# Patient Record
Sex: Female | Born: 1960
Health system: Southern US, Community
[De-identification: ages and names within clinical notes are randomized; demographics above are authoritative.]

## PROBLEM LIST (undated history)

## (undated) DIAGNOSIS — N2581 Secondary hyperparathyroidism of renal origin: Secondary | ICD-10-CM

## (undated) DIAGNOSIS — F1910 Other psychoactive substance abuse, uncomplicated: Secondary | ICD-10-CM

## (undated) DIAGNOSIS — K861 Other chronic pancreatitis: Secondary | ICD-10-CM

## (undated) DIAGNOSIS — F3181 Bipolar II disorder: Secondary | ICD-10-CM

## (undated) DIAGNOSIS — B182 Chronic viral hepatitis C: Secondary | ICD-10-CM

## (undated) DIAGNOSIS — T56891A Toxic effect of other metals, accidental (unintentional), initial encounter: Secondary | ICD-10-CM

## (undated) DIAGNOSIS — F1210 Cannabis abuse, uncomplicated: Secondary | ICD-10-CM

## (undated) DIAGNOSIS — J449 Chronic obstructive pulmonary disease, unspecified: Secondary | ICD-10-CM

## (undated) DIAGNOSIS — G629 Polyneuropathy, unspecified: Secondary | ICD-10-CM

## (undated) DIAGNOSIS — Z720 Tobacco use: Secondary | ICD-10-CM

## (undated) DIAGNOSIS — I639 Cerebral infarction, unspecified: Secondary | ICD-10-CM

## (undated) DIAGNOSIS — F419 Anxiety disorder, unspecified: Secondary | ICD-10-CM

## (undated) DIAGNOSIS — E039 Hypothyroidism, unspecified: Secondary | ICD-10-CM

## (undated) DIAGNOSIS — I2510 Atherosclerotic heart disease of native coronary artery without angina pectoris: Secondary | ICD-10-CM

## (undated) DIAGNOSIS — N183 Chronic kidney disease, stage 3 unspecified: Secondary | ICD-10-CM

## (undated) DIAGNOSIS — G2401 Drug induced subacute dyskinesia: Secondary | ICD-10-CM

## (undated) DIAGNOSIS — E1143 Type 2 diabetes mellitus with diabetic autonomic (poly)neuropathy: Secondary | ICD-10-CM

## (undated) DIAGNOSIS — N184 Chronic kidney disease, stage 4 (severe): Secondary | ICD-10-CM

## (undated) DIAGNOSIS — E119 Type 2 diabetes mellitus without complications: Secondary | ICD-10-CM

## (undated) HISTORY — PX: CHOLECYSTECTOMY: SHX55

---

## 2016-02-28 ENCOUNTER — Ambulatory Visit (INDEPENDENT_AMBULATORY_CARE_PROVIDER_SITE_OTHER): Payer: Medicaid Other | Admitting: Internal Medicine

## 2016-02-28 ENCOUNTER — Encounter: Payer: Self-pay | Admitting: Internal Medicine

## 2016-02-28 DIAGNOSIS — E785 Hyperlipidemia, unspecified: Secondary | ICD-10-CM | POA: Diagnosis not present

## 2016-02-28 DIAGNOSIS — F319 Bipolar disorder, unspecified: Secondary | ICD-10-CM | POA: Diagnosis not present

## 2016-02-28 DIAGNOSIS — B182 Chronic viral hepatitis C: Secondary | ICD-10-CM | POA: Diagnosis present

## 2016-02-28 LAB — CBC WITH DIFFERENTIAL/PLATELET
Basophils Absolute: 78 cells/uL (ref 0–200)
Basophils Relative: 1 %
Eosinophils Absolute: 234 cells/uL (ref 15–500)
Eosinophils Relative: 3 %
HCT: 38.5 % (ref 35.0–45.0)
Hemoglobin: 12.8 g/dL (ref 11.7–15.5)
Lymphocytes Relative: 28 %
Lymphs Abs: 2184 cells/uL (ref 850–3900)
MCH: 28.9 pg (ref 27.0–33.0)
MCHC: 33.2 g/dL (ref 32.0–36.0)
MCV: 86.9 fL (ref 80.0–100.0)
MPV: 11 fL (ref 7.5–12.5)
Monocytes Absolute: 468 cells/uL (ref 200–950)
Monocytes Relative: 6 %
Neutro Abs: 4836 cells/uL (ref 1500–7800)
Neutrophils Relative %: 62 %
Platelets: 170 10*3/uL (ref 140–400)
RBC: 4.43 MIL/uL (ref 3.80–5.10)
RDW: 13.6 % (ref 11.0–15.0)
WBC: 7.8 10*3/uL (ref 3.8–10.8)

## 2016-02-28 LAB — HIV ANTIBODY (ROUTINE TESTING W REFLEX): HIV 1&2 Ab, 4th Generation: NONREACTIVE

## 2016-02-28 MED ORDER — GLECAPREVIR-PIBRENTASVIR 100-40 MG PO TABS: 3.0000 | ORAL_TABLET | Freq: Every day | ORAL | 1 refills | Status: DC

## 2016-02-28 NOTE — Patient Instructions (Signed)
Date 02/28/16  Dear [name], As discussed in the ID Clinic, your hepatitis C therapy will include the following medications:          Mavyret (glecaprevir 100 mg/pibrentasvir 40 mg): Take 3 tablets by mouth once daily for 8 or 12 weeks ---------------------------------------------------------------- Your HCV Treatment Start Date: TBA   Your HCV genotype:  1a    Liver Fibrosis: TBD    ---------------------------------------------------------------- YOUR PHARMACY CONTACT:   [facility] [address] [place], [place] [address] Phone: [phone] Hours: Monday to Friday 7:30 am to 6:00 pm   Please always contact your pharmacy at least 3-4 business days before you run out of medications to ensure your next month's medication is ready or 1 week prior to running out if you receive it by mail.  Remember, each prescription is for 28 days. ---------------------------------------------------------------- GENERAL NOTES REGARDING YOUR HEPATITIS C MEDICATION:  Mavyret: - tablets are pink, oblong shape - take 3 tablets daily with food. - The tablets should be stored at room temperature.  - Acid reducing agents such as H2 blockers (ie. Pepcid (famotidine), Zantac (ranitidine), Tagamet (cimetidine), Axid (nizatidine) and proton pump inhibitors (ie. Prilosec (omeprazole), Protonix (pantoprazole), Nexium (esomeprazole), or Aciphex (rabeprazole)) can decrease effectiveness of Harvoni. Do not take until you have discussed with a health care provider.    -Antacids that contain magnesium and/or aluminum hydroxide (ie. Milk of Magensia, Rolaids, Gaviscon, Maalox, Mylanta, an dArthritis Pain Formula) can reduce absorption of Harvoni, so take them at least 4 hours before or after Harvoni.  -Calcium carbonate (calcium supplements or antacids such as Tums, Caltrate, Os-Cal) needs to be taken at least 4 hours hours before or after Harvoni.  -St. John's wort or any products that contain St.  John's wort like some herbal supplements  Please inform the office prior to starting any of these medications.  - The common side effects associated with Mavyret include:      1. Fatigue      2. Headache      3. Nausea      4. Diarrhea      5. Insomnia  Please note that this only lists the most common side effects and is NOT a comprehensive list of the potential side effects of these medications. For more information, please review the drug information sheets that come with your medication package from the pharmacy.  ---------------------------------------------------------------- GENERAL HELPFUL HINTS ON HCV THERAPY: 1. Stay well-hydrated. 2. Notify the ID Clinic of any changes in your other over-the-counter/herbal or prescription medications. 3. If you miss a dose of your medication, take the missed dose as soon as you remember. Return to your regular time/dose schedule the next day.  4.  Do not stop taking your medications without first talking with your healthcare provider. 5.  You may take Tylenol (acetaminophen), as long as the dose is less than 2000 mg (OR no more than 4 tablets of the Tylenol Extra Strengths 500mg  tablet) in 24 hours. 6.  You will see our pharmacist-specialist within the first 2 weeks of starting your medication to monitor for any possible side effects. 7.  You will have labs once during treatment, after soon after treatment completion and one final lab 6 months after treatment completion to verify the virus is out of your system.  Staci RighterOMER, ROBERT, MD  [facility] [address] [place], [place]  [id number] [phone]

## 2016-02-28 NOTE — Progress Notes (Signed)
[facility]   CC: consideration for treatment for chronic hepatitis C  HPI:  +Sarah Davis is a 56 y.o. female who presents for initial evaluation and management of chronic hepatitis C.  Patient tested positive several years ago. Hepatitis C-associated risk factors present are: IV drug abuse (details: over 30 years ago). Patient denies renal dialysis, sexual contact with person with liver disease. Patient has had other studies performed. Results: hepatitis C RNA by PCR, result: positive. Patient has not had prior treatment for Hepatitis C. Patient does not have a past history of liver disease. Patient does not have a family history of liver disease. Patient does not  have associated signs or symptoms related to liver disease.  Labs reviewed and confirm chronic hepatitis C with a positive viral load.   Records reviewed from her PCP and has genotype 1a, viral load positive, did receive pneumovax vaccine already.  Recent history of lithium toxicity and is on atorvastatin and oxycarbamezapine.        Patient does not have documented immunity to Hepatitis A. Patient does not have documented immunity to Hepatitis B.    Review of Systems:   Constitutional: negative for fatigue and malaise Gastrointestinal: negative for diarrhea Musculoskeletal: negative for myalgias and arthralgias All other systems reviewed and are negative       No past medical history on file.  Prior to Admission medications   Medication Sig Start Date End Date Taking? Authorizing Provider  atorvastatin (LIPITOR) 80 MG tablet Take 80 mg by mouth daily.   Yes Historical Provider, MD  gabapentin (NEURONTIN) 300 MG capsule Take 600 mg by mouth 2 (two) times daily.   Yes Historical Provider, MD  OXcarbazepine (TRILEPTAL) 150 MG tablet Take 450 mg by mouth at bedtime.   Yes Historical Provider, MD  QUEtiapine (SEROQUEL) 100 MG tablet Take 100 mg by mouth daily.   Yes Historical Provider, MD    QUEtiapine (SEROQUEL) 400 MG tablet Take 400 mg by mouth at bedtime.   Yes Historical Provider, MD  Glecaprevir-Pibrentasvir (MAVYRET) 100-40 MG TABS Take 3 tablets by mouth daily. 02/28/16   Gardiner Barefoot, MD    Allergies  Allergen Reactions  . Lithium Anaphylaxis  . Tramadol Nausea Only  . Sulfa Antibiotics Rash    Social History  Substance Use Topics  . Smoking status: Current Every Day Smoker    Packs/day: 0.50    Start date: 02/18/1994  . Smokeless tobacco: Never Used  . Alcohol use No    FMH: no cirrhosis, no liver cancer; father with bipolar   Objective:  Constitutional: in no apparent distress and alert,  Vitals:   02/28/16 1053  BP: 119/85  Pulse: 97  Temp: 98.3 F (36.8 C)   Eyes: anicteric Cardiovascular: Cor RRR and No murmurs Respiratory: CTA B; normal respiratory effort Gastrointestinal: Bowel sounds are normal, liver is not enlarged, spleen is not enlarged Musculoskeletal: no pedal edema noted Skin: negatives: no rash; no porphyria cutanea tarda Lymphatic: no cervical lymphadenopathy   Laboratory  Labs and history reviewed and show CHILD-PUGH unknown  5-6 points: Child class A 7-9 points: Child class B 10-15 points: Child class C  No results found for: INR, BILITOT, ALBUMIN   Assessment: New Patient with Chronic Hepatitis C genotype 1a, untreated.  I discussed with the patient the lab findings that confirm chronic hepatitis C as well as the natural history and progression of disease including about 30% of people who develop cirrhosis of the liver  if left untreated and once cirrhosis is established there is a 2-7% risk per year of liver cancer and liver failure.  I discussed the importance of treatment and benefits in reducing the risk, even if significant liver fibrosis exists.   Plan: 1) Patient counseled extensively on limiting acetaminophen to no more than 2 grams daily, avoidance of alcohol. 2) Transmission discussed with patient including  sexual transmission, sharing razors and toothbrush.   3) Will need referral to gastroenterology if concern for cirrhosis 4) Will need referral for substance abuse counseling: No.; Further work up to include urine drug screen  No. 5) Will prescribe Mayvret for 8 weeks 12 weeks 6) Hepatitis A and B titers 8) Pneumovax vaccine given by her PCP recently 9) Further work up to include liver staging with Fibrosure 10) will follow up after starting medication 11) bipolar - she will get off of her oxycarbamezapine and will hold atorvastatin while on hepatitis C medication.

## 2016-02-29 LAB — COMPLETE METABOLIC PANEL WITH GFR
ALT: 17 U/L (ref 6–29)
AST: 19 U/L (ref 10–35)
Albumin: 4 g/dL (ref 3.6–5.1)
Alkaline Phosphatase: 112 U/L (ref 33–130)
BUN: 30 mg/dL — ABNORMAL HIGH (ref 7–25)
CO2: 23 mmol/L (ref 20–31)
Calcium: 9 mg/dL (ref 8.6–10.4)
Chloride: 105 mmol/L (ref 98–110)
Creat: 1.74 mg/dL — ABNORMAL HIGH (ref 0.50–1.05)
GFR, Est African American: 37 mL/min — ABNORMAL LOW (ref >=60)
GFR, Est Non African American: 32 mL/min — ABNORMAL LOW (ref >=60)
Glucose, Bld: 233 mg/dL — ABNORMAL HIGH (ref 65–99)
Potassium: 3.6 mmol/L (ref 3.5–5.3)
Sodium: 140 mmol/L (ref 135–146)
Total Bilirubin: 0.4 mg/dL (ref 0.2–1.2)
Total Protein: 7.3 g/dL (ref 6.1–8.1)

## 2016-02-29 LAB — HEPATITIS B SURFACE ANTIGEN: Hepatitis B Surface Ag: NEGATIVE

## 2016-02-29 LAB — HEPATITIS B SURFACE ANTIBODY,QUALITATIVE: Hep B S Ab: NEGATIVE

## 2016-02-29 LAB — HEPATITIS A ANTIBODY, TOTAL: Hep A Total Ab: NONREACTIVE

## 2016-02-29 LAB — PROTIME-INR
INR: 1
Prothrombin Time: 10.7 s (ref 9.0–11.5)

## 2016-02-29 LAB — HEPATITIS B CORE ANTIBODY, TOTAL: Hep B Core Total Ab: NONREACTIVE

## 2016-03-02 LAB — LIVER FIBROSIS, FIBROTEST-ACTITEST
ALT: 17 U/L (ref 6–29)
Alpha-2-Macroglobulin: 440 mg/dL — ABNORMAL HIGH (ref 106–279)
Apolipoprotein A1: 144 mg/dL (ref 101–198)
Bilirubin: 0.3 mg/dL (ref 0.2–1.2)
Fibrosis Score: 0.52
GGT: 58 U/L (ref 3–70)
Haptoglobin: 134 mg/dL (ref 43–212)
Necroinflammat ACT Score: 0.08
Reference ID: 1772120

## 2016-03-04 LAB — HEPATITIS C RNA QUANTITATIVE
HCV Quantitative Log: 6.26 {Log} — ABNORMAL HIGH (ref <1.18)
HCV Quantitative: 1801879 IU/mL — ABNORMAL HIGH (ref <15)

## 2016-04-03 MED FILL — MAVYRET 100-40 MG TABS: 100-40 | 28 days supply | Qty: 84 | Fill #0

## 2016-04-22 ENCOUNTER — Encounter: Payer: Self-pay | Admitting: Pharmacy Technician

## 2016-04-30 ENCOUNTER — Ambulatory Visit (INDEPENDENT_AMBULATORY_CARE_PROVIDER_SITE_OTHER): Payer: Medicaid Other | Admitting: Pharmacist Clinician (PhC)/ Clinical Pharmacy Specialist

## 2016-04-30 DIAGNOSIS — Z23 Encounter for immunization: Secondary | ICD-10-CM | POA: Diagnosis not present

## 2016-04-30 DIAGNOSIS — B182 Chronic viral hepatitis C: Secondary | ICD-10-CM | POA: Diagnosis present

## 2016-04-30 LAB — BASIC METABOLIC PANEL
BUN: 27 mg/dL — ABNORMAL HIGH (ref 7–25)
CO2: 23 mmol/L (ref 20–31)
Calcium: 9.4 mg/dL (ref 8.6–10.4)
Chloride: 104 mmol/L (ref 98–110)
Creat: 1.89 mg/dL — ABNORMAL HIGH (ref 0.50–1.05)
Glucose, Bld: 173 mg/dL — ABNORMAL HIGH (ref 65–99)
Potassium: 4 mmol/L (ref 3.5–5.3)
Sodium: 138 mmol/L (ref 135–146)

## 2016-04-30 MED FILL — MAVYRET 100-40 MG TABS: 100-40 | 28 days supply | Qty: 84 | Fill #1

## 2016-04-30 NOTE — Progress Notes (Signed)
HPI: Sarah Davis is a 56 y.o. female who is here for her follow for hep C  No results found for: HCVGENOTYPE, HEPCGENOTYPE  Allergies: Allergies  Allergen Reactions  . Lithium Anaphylaxis  . Tramadol Nausea Only  . Codeine Rash  . Sulfa Antibiotics Rash    Vitals:    Past Medical History: No past medical history on file.  Social History: Social History   Social History  . Marital status: Single    Spouse name: N/A  . Number of children: N/A  . Years of education: N/A   Social History Main Topics  . Smoking status: Current Every Day Smoker    Packs/day: 0.50    Start date: 02/18/1994  . Smokeless tobacco: Never Used  . Alcohol use No  . Drug use: No  . Sexual activity: Not Currently   Other Topics Concern  . Not on file   Social History Narrative  . No narrative on file    Labs: Hep B S Ab (no units)  Date Value  02/28/2016 NEG   Hepatitis B Surface Ag (no units)  Date Value  02/28/2016 NEGATIVE    No results found for: HCVGENOTYPE, HEPCGENOTYPE  Hepatitis C RNA quantitative Latest Ref Rng & Units 02/28/2016  HCV Quantitative <15 IU/mL 1,801,879(H)  HCV Quantitative Log <1.18 log 10 6.26(H)    AST (U/L)  Date Value  02/28/2016 19   ALT (U/L)  Date Value  02/28/2016 17  02/28/2016 17   INR (no units)  Date Value  02/28/2016 1.0    CrCl: CrCl cannot be calculated (Patient's most recent lab result is older than the maximum 21 days allowed.).  Fibrosis Score: F2 as assessed by Fibrosure  Child-Pugh Score: Class A  Previous Treatment Regimen: None  Assessment: Cline CrockKandy started on her Mavyret around 2/16. She has not missed any doses. It caused some mild tiredness so she takes it at night. Since she is F2 she will only need 8 wks through medicaid. Told [facility] pharmacy to mail out her second month supply today since she has about 3 days left. Going to get a VL today.   Gave her the hep A and B vaccine today. Will see her in 1 month for the second  hep B and we can do the EOT hep C VL at that time. She will be schedule with pharmacy for the cure visit.   Recommendations:  Hep C VL, Bmet Cont Mavyret x 2 month Hep A/B vaccine today See back in 1 month for vaccine and EOT VL   Leonidus Rowand, Pharm.D., BCPS, AAHIVP Clinical Infectious Disease Pharmacist [facility] 04/30/2016, 2:55 PM

## 2016-04-30 NOTE — Patient Instructions (Signed)
Continue your Mavyret for 3 month Come back and see me in a month for labs and vaccine

## 2016-05-02 LAB — HEPATITIS C RNA QUANTITATIVE
HCV Quantitative Log: <1.18 Log IU/mL
HCV Quantitative: <15 IU/mL

## 2016-06-11 ENCOUNTER — Ambulatory Visit (INDEPENDENT_AMBULATORY_CARE_PROVIDER_SITE_OTHER): Payer: Medicaid Other | Admitting: Pharmacist

## 2016-06-11 DIAGNOSIS — B182 Chronic viral hepatitis C: Secondary | ICD-10-CM | POA: Diagnosis not present

## 2016-06-11 DIAGNOSIS — Z23 Encounter for immunization: Secondary | ICD-10-CM

## 2016-06-11 NOTE — Progress Notes (Signed)
HPI: Sarah Davis is a 56 y.o. female who presents to the [facility] pharmacy clinic for EOT follow-up of her Hep C infection. She finished 8 weeks of Mavyret on April 15th.   No results found for: HCVGENOTYPE, HEPCGENOTYPE  Allergies: Allergies  Allergen Reactions  . Lithium Anaphylaxis  . Tramadol Nausea Only  . Codeine Rash  . Sulfa Antibiotics Rash    Past Medical History: No past medical history on file.  Social History: Social History   Social History  . Marital status: Single    Spouse name: N/A  . Number of children: N/A  . Years of education: N/A   Social History Main Topics  . Smoking status: Current Every Day Smoker    Packs/day: 0.50    Start date: 02/18/1994  . Smokeless tobacco: Never Used  . Alcohol use No  . Drug use: No  . Sexual activity: Not Currently   Other Topics Concern  . Not on file   Social History Narrative  . No narrative on file    Labs: Hep B S Ab (no units)  Date Value  02/28/2016 NEG   Hepatitis B Surface Ag (no units)  Date Value  02/28/2016 NEGATIVE    No results found for: HCVGENOTYPE, HEPCGENOTYPE  Hepatitis C RNA quantitative Latest Ref Rng & Units 04/30/2016 02/28/2016  HCV Quantitative NOT DETECTED IU/mL <15 NOT DETECTED 1,801,879(H)  HCV Quantitative Log NOT DETECTED Log IU/mL <1.18 NOT DETECTED 6.26(H)    AST (U/L)  Date Value  02/28/2016 19   ALT (U/L)  Date Value  02/28/2016 17  02/28/2016 17   INR (no units)  Date Value  02/28/2016 1.0    CrCl: CrCl cannot be calculated (Patient's most recent lab result is older than the maximum 21 days allowed.).  Fibrosis Score: F2 as assessed by fibrosure   Child-Pugh Score: A  Previous Treatment Regimen: None  Assessment: Sarah Davis is here today for her Hep C EOT follow-up.  She did not miss any doses of her Mavyret the entire 8 weeks.  She states it made her dizzy, nauseous, and tired, but it has since resolved after completing therapy.  She is asking today if she can  go back on her Lipitor.  I told her yes it would be fine. Told her it was ok to go back on the Trileptal too and she said she was going to see about restarting that one.  Will check another EOT Hep C viral load today.  Will also give 2nd Hep B vaccine and make all of her follow-ups.    Plans: - EOT Hep C viral load today - 2nd Hep B shot today - F/u with lab 7/16 at 2:30 for SVR12  - F/u with pharmacy 7/23 at 2:30pm for SVR12 visit - 2nd Hep A and 3rd Hep B 9/17 at 2:30pm  Sarah Davis L. Dinita Migliaccio, PharmD, CPP Infectious Diseases Clinical Pharmacist [facility] 06/11/2016, 3:13 PM

## 2016-06-13 LAB — HEPATITIS C RNA QUANTITATIVE
HCV Quantitative Log: <1.18 Log IU/mL
HCV Quantitative: <15 IU/mL

## 2016-09-02 ENCOUNTER — Telehealth: Payer: Self-pay | Admitting: Pharmacist Clinician (PhC)/ Clinical Pharmacy Specialist

## 2016-09-02 ENCOUNTER — Other Ambulatory Visit: Payer: Medicaid Other

## 2016-09-02 NOTE — Telephone Encounter (Signed)
Won't be able to come [facility] because she just broke her ankle and can't put any weight on it. She will call back to reschedule the appt when she can move around.

## 2016-09-09 ENCOUNTER — Ambulatory Visit: Payer: Medicaid Other

## 2016-10-16 ENCOUNTER — Ambulatory Visit (INDEPENDENT_AMBULATORY_CARE_PROVIDER_SITE_OTHER): Payer: Medicaid Other | Admitting: Pharmacist

## 2016-10-16 DIAGNOSIS — B182 Chronic viral hepatitis C: Secondary | ICD-10-CM

## 2016-10-16 NOTE — Progress Notes (Signed)
HPI: Sarah Davis is a 56 y.o. female who presents to the [facility] pharmacy clinic for her Hep C cure visit.  She completed 8 weeks of Mavyret back in April.   No results found for: HCVGENOTYPE, HEPCGENOTYPE  Allergies: Allergies  Allergen Reactions  . Lithium Anaphylaxis  . Tramadol Nausea Only  . Codeine Rash  . Sulfa Antibiotics Rash    Past Medical History: No past medical history on file.  Social History: Social History   Social History  . Marital status: Single    Spouse name: N/A  . Number of children: N/A  . Years of education: N/A   Social History Main Topics  . Smoking status: Current Every Day Smoker    Packs/day: 0.50    Start date: 02/18/1994  . Smokeless tobacco: Never Used  . Alcohol use No  . Drug use: No  . Sexual activity: Not Currently   Other Topics Concern  . Not on file   Social History Narrative  . No narrative on file    Labs: Hep B S Ab (no units)  Date Value  02/28/2016 NEG   Hepatitis B Surface Ag (no units)  Date Value  02/28/2016 NEGATIVE    No results found for: HCVGENOTYPE, HEPCGENOTYPE  Hepatitis C RNA quantitative Latest Ref Rng & Units 06/11/2016 04/30/2016 02/28/2016  HCV Quantitative NOT DETECTED IU/mL <15 NOT DETECTED <15 NOT DETECTED 1,801,879(H)  HCV Quantitative Log NOT DETECTED Log IU/mL <1.18 NOT DETECTED <1.18 NOT DETECTED 6.26(H)    AST (U/L)  Date Value  02/28/2016 19   ALT (U/L)  Date Value  02/28/2016 17  02/28/2016 17   INR (no units)  Date Value  02/28/2016 1.0    CrCl: CrCl cannot be calculated (Patient's most recent lab result is older than the maximum 21 days allowed.).  Fibrosis Score: F2 as assessed by fibrosure  Child-Pugh Score: A  Previous Treatment Regimen: None  Assessment: Sarah Davis is here today to follow-up for her Hep C cure visit.  She successfully completed 8 weeks of Mavyret back in April with no missed doses. She will get her SVR12 Hep C viral load today.  I will call her with the  results.  Educated her on the risk of reinfection and to avoid excessive alcohol use.    Plans: - Hep C cure viral load today  Cassie L. Kuppelweiser, PharmD, CPP Infectious Diseases Clinical Pharmacist [facility] 10/16/2016, 3:48 PM

## 2016-10-22 ENCOUNTER — Telehealth: Payer: Self-pay | Admitting: Pharmacist Clinician (PhC)/ Clinical Pharmacy Specialist

## 2016-10-22 LAB — HEPATITIS C RNA QUANTITATIVE
HCV Quantitative Log: <1.18 Log IU/mL
HCV Quantitative: <15 IU/mL

## 2016-10-22 NOTE — Telephone Encounter (Signed)
Called Sarah Davis to let her knows that she is now cured of her hep C.

## 2016-11-04 ENCOUNTER — Ambulatory Visit: Payer: Medicaid Other

## 2017-06-19 ENCOUNTER — Encounter (HOSPITAL_COMMUNITY): Payer: Self-pay | Admitting: *Deleted

## 2017-06-19 ENCOUNTER — Ambulatory Visit (HOSPITAL_COMMUNITY)
Admission: RE | Admit: 2017-06-19 | Discharge: 2017-06-19 | Disposition: A | Discharge status: Home / Self Care | Payer: Medicaid Other | Attending: Psychiatry | Admitting: Psychiatry

## 2017-06-19 ENCOUNTER — Emergency Department (HOSPITAL_COMMUNITY)
Admission: EM | Admit: 2017-06-19 | Discharge: 2017-06-20 | Disposition: A | Discharge status: Home / Self Care | Payer: Medicaid Other | Attending: Emergency Medicine | Admitting: Emergency Medicine

## 2017-06-19 DIAGNOSIS — F1920 Other psychoactive substance dependence, uncomplicated: Secondary | ICD-10-CM | POA: Diagnosis not present

## 2017-06-19 DIAGNOSIS — F419 Anxiety disorder, unspecified: Secondary | ICD-10-CM

## 2017-06-19 DIAGNOSIS — F17210 Nicotine dependence, cigarettes, uncomplicated: Secondary | ICD-10-CM | POA: Insufficient documentation

## 2017-06-19 DIAGNOSIS — Z9114 Patient's other noncompliance with medication regimen: Secondary | ICD-10-CM | POA: Insufficient documentation

## 2017-06-19 DIAGNOSIS — R451 Restlessness and agitation: Secondary | ICD-10-CM | POA: Diagnosis not present

## 2017-06-19 DIAGNOSIS — E1165 Type 2 diabetes mellitus with hyperglycemia: Secondary | ICD-10-CM | POA: Insufficient documentation

## 2017-06-19 DIAGNOSIS — F3181 Bipolar II disorder: Secondary | ICD-10-CM | POA: Insufficient documentation

## 2017-06-19 DIAGNOSIS — R739 Hyperglycemia, unspecified: Secondary | ICD-10-CM

## 2017-06-19 DIAGNOSIS — R454 Irritability and anger: Secondary | ICD-10-CM | POA: Insufficient documentation

## 2017-06-19 DIAGNOSIS — Z91148 Patient's other noncompliance with medication regimen for other reason: Secondary | ICD-10-CM

## 2017-06-19 DIAGNOSIS — F329 Major depressive disorder, single episode, unspecified: Secondary | ICD-10-CM | POA: Diagnosis present

## 2017-06-19 DIAGNOSIS — F1910 Other psychoactive substance abuse, uncomplicated: Secondary | ICD-10-CM

## 2017-06-19 DIAGNOSIS — E118 Type 2 diabetes mellitus with unspecified complications: Secondary | ICD-10-CM

## 2017-06-19 HISTORY — DX: Bipolar II disorder: F31.81

## 2017-06-19 HISTORY — DX: Cerebral infarction, unspecified: I63.9

## 2017-06-19 LAB — CBC
HCT: 44.2 % (ref 36.0–46.0)
Hemoglobin: 14.5 g/dL (ref 12.0–15.0)
MCH: 27.2 pg (ref 26.0–34.0)
MCHC: 32.8 g/dL (ref 30.0–36.0)
MCV: 82.9 fL (ref 78.0–100.0)
Platelets: 249 10*3/uL (ref 150–400)
RBC: 5.33 MIL/uL — ABNORMAL HIGH (ref 3.87–5.11)
RDW: 14.3 % (ref 11.5–15.5)
WBC: 12.8 10*3/uL — ABNORMAL HIGH (ref 4.0–10.5)

## 2017-06-19 LAB — RAPID URINE DRUG SCREEN, HOSP PERFORMED
Amphetamines: POSITIVE — AB
Barbiturates: NOT DETECTED
Benzodiazepines: POSITIVE — AB
Cocaine: NOT DETECTED
Opiates: NOT DETECTED
Tetrahydrocannabinol: POSITIVE — AB

## 2017-06-19 NOTE — H&P (Signed)
Behavioral Health Medical Screening Exam  Sarah Davis is an 57 y.o. female who presents to [facility] with depression, feelings of guilt, worthlessness, tearfulness, wanting to be alone, and irritability. Patient has a history of Bipolar II disorder. Patient reports that they had visitors recently and someone took her her seroquel. She had the seroquel filled on 4/18. Mother is concerned that the patient is purposefully taking in a suicide attempt as patient had a similar situation 2 years ago with lithium and was hospitalized at [facility]. Patient denies that she is suicidal and denies that the situation 2 years ago was a suicide attempt. Patient states that she really needs her seroquel and that it is the only thing that helps with her anxiety and helps her to sleep. According to her mother, the patient stays in the bed all day and sleeps all the time. Patient is anxious and irritable during the exam. Denies AVH. Reports  that she is taking her other medications.  Total Time spent with patient: 20 minutes  Psychiatric Specialty Exam: Physical Exam  Constitutional: She is oriented to person, place, and time. She appears well-developed and well-nourished. No distress.  HENT:  Head: Normocephalic and atraumatic.  Right Ear: External ear normal.  Left Ear: External ear normal.  Eyes: Conjunctivae are normal. Right eye exhibits no discharge. Left eye exhibits no discharge. No scleral icterus.  Respiratory: Effort normal. No respiratory distress.  Musculoskeletal: Normal range of motion.  Neurological: She is alert and oriented to person, place, and time.  Skin: Skin is warm and dry. She is not diaphoretic.  Psychiatric: Her mood appears anxious. Her affect is labile. Her speech is rapid and/or pressured. She is agitated. She is not withdrawn and not actively hallucinating. Thought content is not paranoid and not delusional. Cognition and memory are normal. She expresses impulsivity and inappropriate  judgment. She exhibits a depressed mood. She expresses no homicidal ideation.    Review of Systems  Psychiatric/Behavioral: Positive for depression. Negative for hallucinations, memory loss, substance abuse and suicidal ideas. The patient is nervous/anxious and has insomnia.     Blood pressure (!) 146/97, pulse 72, temperature 98.4 F (36.9 C), resp. rate 18, SpO2 100 %.There is no height or weight on file to calculate BMI.  General Appearance: Casual and Fairly Groomed  Eye Contact:  Fair  Speech:  Clear and Coherent and Pressured  Volume:  Normal  Mood:  Anxious, Depressed, Dysphoric, Hopeless, Irritable and Worthless  Affect:  Labile  Thought Process:  Coherent and Descriptions of Associations: Intact  Orientation:  Full (Time, Place, and Person)  Thought Content:  Logical and Hallucinations: None  Suicidal Thoughts:  Denies  Homicidal Thoughts:  No  Memory:  Immediate;   Good Recent;   Good  Judgement:  Fair  Insight:  Fair  Psychomotor Activity:  Normal  Concentration: Concentration: Poor and Attention Span: Poor  Recall:  Good  Fund of Knowledge:Good  Language: Good  Akathisia:  No  Handed:  Right  AIMS (if indicated):     Assets:  Architect Housing Leisure Time Physical Health  Sleep:       Musculoskeletal: Strength & Muscle Tone: within normal limits Gait & Station: normal   Blood pressure (!) 146/97, pulse 72, temperature 98.4 F (36.9 C), resp. rate 18, SpO2 100 %.  Recommendations:  Based on my evaluation the patient does not appear to have an emergency medical condition. I am concerned that the patient may be taking more  seroquel than prescribed, as she mentioned several times that it helps with her anxiety and is the only thing that helps her sleep. Will send for medical clearance and observation.  Jackelyn Poling, NP 06/19/2017, 9:02 PM

## 2017-06-19 NOTE — ED Notes (Addendum)
Clothing placed in 1 clothing bag at triage desk. 1 pair of glasses pt admits are already broken.
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
## 2017-06-19 NOTE — ED Triage Notes (Signed)
Pt stated "I've been out of Seroquel x 3 days.  I can get it if I pay $400.  [facility] called in something else that my son picked up.  I haven't slept or eaten x 3 days."

## 2017-06-20 DIAGNOSIS — F17210 Nicotine dependence, cigarettes, uncomplicated: Secondary | ICD-10-CM | POA: Diagnosis not present

## 2017-06-20 DIAGNOSIS — F1920 Other psychoactive substance dependence, uncomplicated: Secondary | ICD-10-CM | POA: Diagnosis not present

## 2017-06-20 LAB — COMPREHENSIVE METABOLIC PANEL
ALT: 15 U/L (ref 14–54)
AST: 20 U/L (ref 15–41)
Albumin: 3.9 g/dL (ref 3.5–5.0)
Alkaline Phosphatase: 135 U/L — ABNORMAL HIGH (ref 38–126)
Anion gap: 11 (ref 5–15)
BUN: 18 mg/dL (ref 6–20)
CO2: 25 mmol/L (ref 22–32)
Calcium: 9.3 mg/dL (ref 8.9–10.3)
Chloride: 105 mmol/L (ref 101–111)
Creatinine, Ser: 1.53 mg/dL — ABNORMAL HIGH (ref 0.44–1.00)
GFR calc Af Amer: 43 mL/min — ABNORMAL LOW (ref >60)
GFR calc non Af Amer: 37 mL/min — ABNORMAL LOW (ref >60)
Glucose, Bld: 263 mg/dL — ABNORMAL HIGH (ref 65–99)
Potassium: 3.2 mmol/L — ABNORMAL LOW (ref 3.5–5.1)
Sodium: 141 mmol/L (ref 135–145)
Total Bilirubin: 0.5 mg/dL (ref 0.3–1.2)
Total Protein: 8.2 g/dL — ABNORMAL HIGH (ref 6.5–8.1)

## 2017-06-20 LAB — SALICYLATE LEVEL: Salicylate Lvl: <7 mg/dL (ref 2.8–30.0)

## 2017-06-20 LAB — ETHANOL: Alcohol, Ethyl (B): <10 mg/dL (ref <10)

## 2017-06-20 LAB — ACETAMINOPHEN LEVEL: Acetaminophen (Tylenol), Serum: <10 ug/mL — ABNORMAL LOW (ref 10–30)

## 2017-06-20 MED ORDER — LAMOTRIGINE 100 MG PO TABS
100.0000 mg | ORAL_TABLET | Freq: Every day | ORAL | Status: DC
  Administered 2017-06-20: 100 mg via ORAL
  Filled 2017-06-20: qty 1

## 2017-06-20 MED ORDER — ATORVASTATIN CALCIUM 80 MG PO TABS: 80.0000 mg | ORAL_TABLET | Freq: Every day | ORAL | Status: DC

## 2017-06-20 MED ORDER — QUETIAPINE FUMARATE 100 MG PO TABS
100.0000 mg | ORAL_TABLET | Freq: Two times a day (BID) | ORAL | Status: DC
  Administered 2017-06-20: 100 mg via ORAL
  Filled 2017-06-20: qty 1

## 2017-06-20 MED ORDER — METFORMIN HCL 500 MG PO TABS
500.0000 mg | ORAL_TABLET | Freq: Two times a day (BID) | ORAL | Status: DC
  Administered 2017-06-20 (×2): 500 mg via ORAL
  Filled 2017-06-20 (×2): qty 1

## 2017-06-20 MED ORDER — POTASSIUM CHLORIDE CRYS ER 20 MEQ PO TBCR: 40.0000 meq | EXTENDED_RELEASE_TABLET | Freq: Once | ORAL | Status: DC

## 2017-06-20 MED ORDER — POTASSIUM CHLORIDE CRYS ER 20 MEQ PO TBCR: 40.0000 meq | EXTENDED_RELEASE_TABLET | Freq: Once | ORAL | 0 refills | Status: DC

## 2017-06-20 MED ORDER — HYDROXYZINE HCL 25 MG PO TABS
50.0000 mg | ORAL_TABLET | Freq: Three times a day (TID) | ORAL | Status: DC | PRN
  Administered 2017-06-20: 50 mg via ORAL
  Filled 2017-06-20: qty 2

## 2017-06-20 MED ORDER — METFORMIN HCL 500 MG PO TABS: 500.0000 mg | ORAL_TABLET | Freq: Two times a day (BID) | ORAL | 0 refills | Status: DC

## 2017-06-20 MED ORDER — QUETIAPINE FUMARATE 100 MG PO TABS: 100.0000 mg | ORAL_TABLET | Freq: Two times a day (BID) | ORAL | 0 refills | Status: DC

## 2017-06-20 MED ORDER — GABAPENTIN 300 MG PO CAPS
300.0000 mg | ORAL_CAPSULE | Freq: Every day | ORAL | Status: DC
  Administered 2017-06-20: 300 mg via ORAL
  Filled 2017-06-20: qty 1

## 2017-06-20 NOTE — Discharge Instructions (Signed)
For your behavioral health needs, you are advised to continue treatment with Sarah Davis, your current outpatient provider.

## 2017-06-20 NOTE — ED Notes (Signed)
EDP at bedside, pt agreeing to stay at hospital to see psychiatry team..

## 2017-06-20 NOTE — BHH Counselor (Signed)
Clinician expressed to Consuella Lose, RN pt was seen at [facility] for an assessment and the disposition was overnight observation for safety and stabilization.    Redmond Pulling, MS, [facility], [facility] Triage Specialist [phone]
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
## 2017-06-20 NOTE — ED Notes (Signed)
SBAR Report received from previous nurse. Pt received calm and visible on unit. Pt denies current SI/ HI, A/V H, depression, anxiety, or pain at this time, and appears otherwise stable and free of distress. Pt reminded of camera surveillance, q 15 min rounds, and rules of the milieu. Will continue to assess. 

## 2017-06-20 NOTE — Consult Note (Addendum)
[facility] Psychiatry Consult   Reason for Consult:  Drug use with depression Referring Physician:  EDP Patient Identification: Sarah Davis MRN:  [id number] Principal Diagnosis: Polysubstance (excluding opioids) dependence, daily use ([place]) Diagnosis:   Patient Active Problem List   Diagnosis Date Noted  . Polysubstance (excluding opioids) dependence, daily use ([place]) [F19.20] 06/20/2017    Priority: High  . Chronic hepatitis C without hepatic coma ([place]) [B18.2] 02/28/2016  . Bipolar disorder ([place]) [F31.9] 02/28/2016  . Dyslipidemia [E78.5] 02/28/2016    Total Time spent with patient: 45 minutes  Subjective:   Sarah Davis is a 56 y.o. female patient does not warrant admission.  HPI:  57 yo female who presented to the ED from [facility] with polysubstance abuse and depression.  She was demanding ginger ale this morning and cursing at the nurse.  When this provider went to talk to her, she again demanded gingerale and cursed.  Let her know we would like to help her but needed her to cooperate and not throw water and food.  Continued to curse and demand, denied suicidal/homicidal ideations, hallucinations, and withdrawal symptoms.  Minimizes her drug abuse, does not want rehab.  Past Psychiatric History: substance abuse  Risk to Self: No Risk to Others:  No Prior Inpatient Therapy:  No  Prior Outpatient Therapy:  Yes  Past Medical History:  Past Medical History:  Diagnosis Date  . Bipolar 2 disorder ([place])   . Stroke [facility])     Past Surgical History:  Procedure Laterality Date  . CHOLECYSTECTOMY     Family History: No family history on file. Family Psychiatric  History: none Social History:  Social History   Substance and Sexual Activity  Alcohol Use No     Social History   Substance and Sexual Activity  Drug Use No    Social History   Socioeconomic History  . Marital status: Single    Spouse name: Not on file  . Number of children: Not on file  . Years of education:  Not on file  . Highest education level: Not on file  Occupational History  . Not on file  Social Needs  . Financial resource strain: Not on file  . Food insecurity:    Worry: Not on file    Inability: Not on file  . Transportation needs:    Medical: Not on file    Non-medical: Not on file  Tobacco Use  . Smoking status: Current Every Day Smoker    Packs/day: 0.50    Start date: 02/18/1994  . Smokeless tobacco: Never Used  Substance and Sexual Activity  . Alcohol use: No  . Drug use: No  . Sexual activity: Not Currently  Lifestyle  . Physical activity:    Days per week: Not on file    Minutes per session: Not on file  . Stress: Not on file  Relationships  . Social connections:    Talks on phone: Not on file    Gets together: Not on file    Attends religious service: Not on file    Active member of club or organization: Not on file    Attends meetings of clubs or organizations: Not on file    Relationship status: Not on file  Other Topics Concern  . Not on file  Social History Narrative  . Not on file   Additional Social History: N/A    Allergies:   Allergies  Allergen Reactions  . Lithium Anaphylaxis  . Tramadol Nausea Only  .  Codeine Rash  . Sulfa Antibiotics Rash    Labs:  Results for orders placed or performed during the hospital encounter of 06/19/17 (from the past 48 hour(s))  Comprehensive metabolic panel     Status: Abnormal   Collection Time: 06/19/17 10:54 PM  Result Value Ref Range   Sodium 141 135 - 145 mmol/L   Potassium 3.2 (L) 3.5 - 5.1 mmol/L   Chloride 105 101 - 111 mmol/L   CO2 25 22 - 32 mmol/L   Glucose, Bld 263 (H) 65 - 99 mg/dL   BUN 18 6 - 20 mg/dL   Creatinine, Ser 1.53 (H) 0.44 - 1.00 mg/dL   Calcium 9.3 8.9 - 10.3 mg/dL   Total Protein 8.2 (H) 6.5 - 8.1 g/dL   Albumin 3.9 3.5 - 5.0 g/dL   AST 20 15 - 41 U/L   ALT 15 14 - 54 U/L   Alkaline Phosphatase 135 (H) 38 - 126 U/L   Total Bilirubin 0.5 0.3 - 1.2 mg/dL   GFR calc non Af  Amer 37 (L) >60 mL/min   GFR calc Af Amer 43 (L) >60 mL/min    Comment: (NOTE) The eGFR has been calculated using the CKD EPI equation. This calculation has not been validated in all clinical situations. eGFR's persistently <60 mL/min signify possible Chronic Kidney Disease.    Anion gap 11 5 - 15    Comment: Performed at [facility], [place] [address] [place], [place] [address]  Ethanol     Status: None   Collection Time: 06/19/17 10:54 PM  Result Value Ref Range   Alcohol, Ethyl (B) <10 <10 mg/dL    Comment:        LOWEST DETECTABLE LIMIT FOR SERUM ALCOHOL IS 10 mg/dL FOR MEDICAL PURPOSES ONLY Performed at [facility] [address], [place], [place] [address]   Salicylate level     Status: None   Collection Time: 06/19/17 10:54 PM  Result Value Ref Range   Salicylate Lvl <5.7 2.8 - 30.0 mg/dL    Comment: Performed at [facility], [place] [address], [place], [place] [address]  Acetaminophen level     Status: Abnormal   Collection Time: 06/19/17 10:54 PM  Result Value Ref Range   Acetaminophen (Tylenol), Serum <10 (L) 10 - 30 ug/mL    Comment:        THERAPEUTIC CONCENTRATIONS VARY SIGNIFICANTLY. A RANGE OF 10-30 ug/mL MAY BE AN EFFECTIVE CONCENTRATION FOR MANY PATIENTS. HOWEVER, SOME ARE BEST TREATED AT CONCENTRATIONS OUTSIDE THIS RANGE. ACETAMINOPHEN CONCENTRATIONS >150 ug/mL AT 4 HOURS AFTER INGESTION AND >50 ug/mL AT 12 HOURS AFTER INGESTION ARE OFTEN ASSOCIATED WITH TOXIC REACTIONS. Performed at [facility], [place] [address], [place], [place] [address]   cbc     Status: Abnormal   Collection Time: 06/19/17 10:54 PM  Result Value Ref Range   WBC 12.8 (H) 4.0 - 10.5 K/uL   RBC 5.33 (H) 3.87 - 5.11 MIL/uL   Hemoglobin 14.5 12.0 - 15.0 g/dL   HCT 44.2 36.0 - 46.0 %   MCV 82.9 78.0 - 100.0 fL   MCH 27.2 26.0 - 34.0 pg   MCHC 32.8 30.0 - 36.0 g/dL   RDW 14.3 11.5 - 15.5 %   Platelets 249  150 - 400 K/uL    Comment: Performed at [facility], [place] [address] [place], [place] [address]  Rapid urine drug screen (hospital performed)     Status: Abnormal  Collection Time: 06/19/17 11:06 PM  Result Value Ref Range   Opiates NONE DETECTED NONE DETECTED   Cocaine NONE DETECTED NONE DETECTED   Benzodiazepines POSITIVE (A) NONE DETECTED   Amphetamines POSITIVE (A) NONE DETECTED   Tetrahydrocannabinol POSITIVE (A) NONE DETECTED   Barbiturates NONE DETECTED NONE DETECTED    Comment: (NOTE) DRUG SCREEN FOR MEDICAL PURPOSES ONLY.  IF CONFIRMATION IS NEEDED FOR ANY PURPOSE, NOTIFY LAB WITHIN 5 DAYS. LOWEST DETECTABLE LIMITS FOR URINE DRUG SCREEN Drug Class                     Cutoff (ng/mL) Amphetamine and metabolites    1000 Barbiturate and metabolites    200 Benzodiazepine                 591 Tricyclics and metabolites     300 Opiates and metabolites        300 Cocaine and metabolites        300 THC                            50 Performed at [facility], [place] [address], [place], [place] [address]     Current Facility-Administered Medications  Medication Dose Route Frequency Provider Last Rate Last Dose  . atorvastatin (LIPITOR) tablet 80 mg  80 mg Oral q1800 Daleen Bo, MD      . gabapentin (NEURONTIN) capsule 300-600 mg  300-600 mg Oral QHS Daleen Bo, MD   300 mg at 06/20/17 0146  . hydrOXYzine (ATARAX/VISTARIL) tablet 50 mg  50 mg Oral TID PRN Daleen Bo, MD   50 mg at 06/20/17 0156  . lamoTRIgine (LAMICTAL) tablet 100 mg  100 mg Oral QHS Daleen Bo, MD   100 mg at 06/20/17 0155  . metFORMIN (GLUCOPHAGE) tablet 500 mg  500 mg Oral BID WC Daleen Bo, MD   500 mg at 06/20/17 0739  . potassium chloride SA (K-DUR,KLOR-CON) CR tablet 40 mEq  40 mEq Oral Once Daleen Bo, MD      . QUEtiapine (SEROQUEL) tablet 100 mg  100 mg Oral BID Daleen Bo, MD   100 mg at 06/20/17 0147   Current Outpatient Medications   Medication Sig Dispense Refill  . atorvastatin (LIPITOR) 80 MG tablet Take 80 mg by mouth daily.    Marland Kitchen gabapentin (NEURONTIN) 300 MG capsule Take 300-600 mg by mouth at bedtime.     . hydrOXYzine (ATARAX/VISTARIL) 50 MG tablet Take 50 mg by mouth 3 (three) times daily as needed for anxiety.   1  . lamoTRIgine (LAMICTAL) 100 MG tablet Take 100 mg by mouth at bedtime.  0  . QUEtiapine (SEROQUEL) 400 MG tablet Take 800 mg by mouth at bedtime.       Musculoskeletal: Strength & Muscle Tone: within normal limits Gait & Station: normal Patient leans: N/A  Psychiatric Specialty Exam: Physical Exam  Nursing note and vitals reviewed. Constitutional: She is oriented to person, place, and time. She appears well-developed and well-nourished.  HENT:  Head: Normocephalic.  Neck: Normal range of motion.  Respiratory: Effort normal.  Musculoskeletal: Normal range of motion.  Neurological: She is alert and oriented to person, place, and time.  Psychiatric: Her speech is normal and behavior is normal. Judgment and thought content normal. Her affect is angry. Cognition and memory are normal.    Review of Systems  Psychiatric/Behavioral: Positive for substance abuse.  All other systems reviewed and are  negative.   Blood pressure (!) 151/95, pulse 63, temperature 98.3 F (36.8 C), temperature source Oral, resp. rate 16, height 5' 5"  (1.651 m), weight 56.8 kg (125 lb 4.8 oz), SpO2 99 %.Body mass index is 20.85 kg/m.  General Appearance: Casual  Eye Contact:  Good  Speech:  Normal Rate  Volume:  Increased  Mood:  Angry  Affect:  Congruent  Thought Process:  Coherent and Descriptions of Associations: Intact  Orientation:  Full (Time, Place, and Person)  Thought Content:  WDL and Logical  Suicidal Thoughts:  No  Homicidal Thoughts:  No  Memory:  Immediate;   Good Recent;   Good Remote;   Good  Judgement:  Poor  Insight:  Poor  Psychomotor Activity:  Normal  Concentration:  Concentration: Good  and Attention Span: Good  Recall:  Good  Fund of Knowledge:  Good  Language:  Good  Akathisia:  No  Handed:  Right  AIMS (if indicated):   N/A  Assets:  Housing Leisure Time Resilience Social Support  ADL's:  Intact  Cognition:  WNL  Sleep:   N/A     Treatment Plan Summary: Daily contact with patient to assess and evaluate symptoms and progress in treatment, Medication management and Plan polysubstance dependence, non-opioid:  -Crisis stabilization -Medication management:  Restarted medical medications along with Lamictal 100 mg at bedtime for mood stabilization, hydroxyzine 50 mg TID PRN anxiety, gabapentin 300 mg at bedtime for anxiety.  Seroquel at 800 mg at bedtime for mood stabilization, decreased to 100 mg BID. -Individual counseling  Disposition: No evidence of imminent risk to self or others at present.    Waylan Boga, NP 06/20/2017 8:39 AM   Patient seen face-to-face for psychiatric evaluation, chart reviewed and case discussed with the physician extender and developed treatment plan. Reviewed the information documented and agree with the treatment plan.  Buford Dresser, DO 06/20/17 3:45 PM

## 2017-06-20 NOTE — [facility] Suicide Risk Assessment (Signed)
Suicide Risk Assessment  Discharge Assessment   [facility] Discharge Suicide Risk Assessment   Principal Problem: Polysubstance (excluding opioids) dependence, daily use [facility]) Discharge Diagnoses:  Patient Active Problem List   Diagnosis Date Noted  . Polysubstance (excluding opioids) dependence, daily use (HCC) [F19.20] 06/20/2017    Priority: High  . Chronic hepatitis C without hepatic coma (HCC) [B18.2] 02/28/2016  . Bipolar disorder (HCC) [F31.9] 02/28/2016  . Dyslipidemia [E78.5] 02/28/2016    Total Time spent with patient: 45 minutes   Musculoskeletal: Strength & Muscle Tone: within normal limits Gait & Station: normal Patient leans: N/A  Psychiatric Specialty Exam: Physical Exam  Constitutional: She is oriented to person, place, and time. She appears well-developed and well-nourished.  HENT:  Head: Normocephalic.  Neck: Normal range of motion.  Respiratory: Effort normal.  Musculoskeletal: Normal range of motion.  Neurological: She is alert and oriented to person, place, and time.  Psychiatric: Her speech is normal and behavior is normal. Judgment and thought content normal. Her affect is angry. Cognition and memory are normal.    Review of Systems  Psychiatric/Behavioral: Positive for substance abuse.  All other systems reviewed and are negative.   Blood pressure (!) 151/95, pulse 63, temperature 98.3 F (36.8 C), temperature source Oral, resp. rate 16, height  (1.651 m), weight 56.8 kg (125 lb 4.8 oz), SpO2 99 %.Body mass index is 20.85 kg/m.  General Appearance: Casual  Eye Contact:  Good  Speech:  Normal Rate  Volume:  Increased  Mood:  Angry  Affect:  Congruent  Thought Process:  Coherent and Descriptions of Associations: Intact  Orientation:  Full (Time, Place, and Person)  Thought Content:  WDL and Logical  Suicidal Thoughts:  No  Homicidal Thoughts:  No  Memory:  Immediate;   Good Recent;   Good Remote;   Good  Judgement:  Fair  Insight:  Fair   Psychomotor Activity:  Normal  Concentration:  Concentration: Good and Attention Span: Good  Recall:  Good  Fund of Knowledge:  Good  Language:  Good  Akathisia:  No  Handed:  Right  AIMS (if indicated):     Assets:  Housing Leisure Time Physical Health Resilience Social Support  ADL's:  Intact  Cognition:  WNL  Sleep:       Mental Status Per Nursing Assessment::   On Admission:   substance abuse  Demographic Factors:  Caucasian  Loss Factors: NA  Historical Factors: NA  Risk Reduction Factors:   Sense of responsibility to family, Living with another person, especially a relative and Positive social support  Continued Clinical Symptoms:  Irritability, anger  Cognitive Features That Contribute To Risk:  None    Suicide Risk:  Minimal: No identifiable suicidal ideation.  Patients presenting with no risk factors but with morbid ruminations; may be classified as minimal risk based on the severity of the depressive symptoms    Plan Of Care/Follow-up recommendations:  Activity:  as tolerated Diet:  heart healthy diet  LORD, JAMISON, NP 06/20/2017, 10:41 AM

## 2017-06-20 NOTE — ED Notes (Addendum)
No new orders received from Dr. Freida Busman, Informed pt is medically cleared, no Xray needed.

## 2017-06-20 NOTE — ED Notes (Signed)
Pt up at nurses station, loud, verbally agressive, verbally threatening, demanding to leave the hospital, went to her room and threw all cups off her bedside table. This nurse notified EDP.

## 2017-06-20 NOTE — ED Notes (Addendum)
Pt remains verbally aggressive, threatening, stating she is going to "sue the hospital" and "contact everyone's boss." Pt continues to state she feels that she is in jail and that she will be contacting her family and they "will take it to the top." Pt not open to any encouragement or conversation with nursing staff. This nurse in pt room to review discharge summary, pt uninterested. MHT and security present. Pt sitting on bed,  once done reviewing discharge summary, pt got up took a couple steps in her room and fell on the floor. Unknown to staff if pt slipped or threw her self to the floor as a behavior incident. Once on the floor, pt refusing assistance. Pt got up. This nurse encouraged pt to go to chair, pt to chair without difficulty. Pt c/o of pain to left elbow. Full range of motion noted. Informed pt that I would be calling the medical doctor to come to see her. Pt remains hostile, Reports she is ready to go. Encouraged pt to wait to see EDP., pt agreeable, EDP notified. Psychiatry team notified.
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
## 2017-06-20 NOTE — ED Notes (Signed)
This nurse notified AC of pt behavior/complaints.

## 2017-06-20 NOTE — ED Notes (Signed)
EDP at bedside  

## 2017-06-20 NOTE — ED Notes (Signed)
This nurse and Jorene Minors NP at bedside, pt remains verbally aggressive, threatening this nurse and provider.

## 2017-06-20 NOTE — ED Notes (Signed)
Pt refused to have vitals taken before discharge.

## 2017-06-20 NOTE — ED Notes (Signed)
This nurse in hallway providing pt care.  Pt in room yelling and screaming out to the hallway derogatory remarks about nursing staff,  And nursing care

## 2017-06-20 NOTE — BH Assessment (Addendum)
[facility] Assessment Progress Note  Per Nanine Means, DNP, this pt does not require psychiatric hospitalization at this time.  Pt is cleared by psychiatry.   Discharge instructions advise pt to continue treatment with Leia Alf, pt's current outpatient provider.  Pt's nurse, Morrie Sheldon, has been notified.  Doylene Canning, MA Triage Specialist [phone]

## 2017-06-20 NOTE — ED Notes (Signed)
Pt d/c home per MD order.  Pt denies SI/HI/AVH. Personal property returned. Pt off unit with MHT and security.

## 2017-06-23 ENCOUNTER — Telehealth (HOSPITAL_COMMUNITY): Payer: Self-pay

## 2017-06-23 NOTE — [facility] Counselor (Signed)
Pt called TTS and stated this clinician told her to say she was "suicidal and it would go quicker". This clinician road to ER with pt when she was assessed at [facility] and pt was deemed that pt needed to be observed overnight and reassessed in the AM. During the conversation in the ER  this clinician told pt she remembered pt stating she was not currently suicidal but if things stayed the way they were that she could be and this clinician told her at that point to be honest with the provider that re-assessed her. Pt was angry that she was "stripped and locked up" and that she was going to sue due to falling and not getting a ginger ale like she wanted. Pt hung up on thi clinician and called back and was given patient experience number and advised to call [phone].

## 2018-09-05 ENCOUNTER — Other Ambulatory Visit: Payer: Self-pay

## 2018-09-05 ENCOUNTER — Encounter (HOSPITAL_COMMUNITY): Payer: Self-pay

## 2018-09-05 ENCOUNTER — Emergency Department (HOSPITAL_COMMUNITY)
Admission: EM | Admit: 2018-09-05 | Discharge: 2018-09-06 | Disposition: A | Discharge status: Home / Self Care | Payer: Medicaid Other | Attending: Emergency Medicine | Admitting: Emergency Medicine

## 2018-09-05 DIAGNOSIS — Z79899 Other long term (current) drug therapy: Secondary | ICD-10-CM | POA: Diagnosis not present

## 2018-09-05 DIAGNOSIS — Z8673 Personal history of transient ischemic attack (TIA), and cerebral infarction without residual deficits: Secondary | ICD-10-CM | POA: Diagnosis not present

## 2018-09-05 DIAGNOSIS — F17210 Nicotine dependence, cigarettes, uncomplicated: Secondary | ICD-10-CM | POA: Diagnosis not present

## 2018-09-05 DIAGNOSIS — F419 Anxiety disorder, unspecified: Secondary | ICD-10-CM | POA: Diagnosis present

## 2018-09-05 DIAGNOSIS — F3181 Bipolar II disorder: Secondary | ICD-10-CM | POA: Diagnosis not present

## 2018-09-05 DIAGNOSIS — Z590 Homelessness: Secondary | ICD-10-CM | POA: Diagnosis not present

## 2018-09-05 DIAGNOSIS — F329 Major depressive disorder, single episode, unspecified: Secondary | ICD-10-CM | POA: Diagnosis not present

## 2018-09-05 DIAGNOSIS — Z20828 Contact with and (suspected) exposure to other viral communicable diseases: Secondary | ICD-10-CM | POA: Insufficient documentation

## 2018-09-05 DIAGNOSIS — F331 Major depressive disorder, recurrent, moderate: Secondary | ICD-10-CM | POA: Diagnosis present

## 2018-09-05 LAB — BASIC METABOLIC PANEL
Anion gap: 9 (ref 5–15)
BUN: 19 mg/dL (ref 6–20)
CO2: 22 mmol/L (ref 22–32)
Calcium: 8.5 mg/dL — ABNORMAL LOW (ref 8.9–10.3)
Chloride: 107 mmol/L (ref 98–111)
Creatinine, Ser: 1.78 mg/dL — ABNORMAL HIGH (ref 0.44–1.00)
GFR calc Af Amer: 36 mL/min — ABNORMAL LOW (ref >60)
GFR calc non Af Amer: 31 mL/min — ABNORMAL LOW (ref >60)
Glucose, Bld: 283 mg/dL — ABNORMAL HIGH (ref 70–99)
Potassium: 3.7 mmol/L (ref 3.5–5.1)
Sodium: 138 mmol/L (ref 135–145)

## 2018-09-05 LAB — CBC WITH DIFFERENTIAL/PLATELET
Abs Immature Granulocytes: 0.12 10*3/uL — ABNORMAL HIGH (ref 0.00–0.07)
Basophils Absolute: 0.1 10*3/uL (ref 0.0–0.1)
Basophils Relative: 1 %
Eosinophils Absolute: 0.6 10*3/uL — ABNORMAL HIGH (ref 0.0–0.5)
Eosinophils Relative: 6 %
HCT: 36.9 % (ref 36.0–46.0)
Hemoglobin: 12 g/dL (ref 12.0–15.0)
Immature Granulocytes: 1 %
Lymphocytes Relative: 26 %
Lymphs Abs: 2.6 10*3/uL (ref 0.7–4.0)
MCH: 29.9 pg (ref 26.0–34.0)
MCHC: 32.5 g/dL (ref 30.0–36.0)
MCV: 91.8 fL (ref 80.0–100.0)
Monocytes Absolute: 0.9 10*3/uL (ref 0.1–1.0)
Monocytes Relative: 9 %
Neutro Abs: 5.6 10*3/uL (ref 1.7–7.7)
Neutrophils Relative %: 57 %
Platelets: 209 10*3/uL (ref 150–400)
RBC: 4.02 MIL/uL (ref 3.87–5.11)
RDW: 12.8 % (ref 11.5–15.5)
WBC: 9.9 10*3/uL (ref 4.0–10.5)
nRBC: 0 % (ref 0.0–0.2)

## 2018-09-05 MED ORDER — SODIUM CHLORIDE 0.9 % IV BOLUS
1000.0000 mL | Freq: Once | INTRAVENOUS | Status: AC
  Administered 2018-09-05: 23:00:00 1000 mL via INTRAVENOUS

## 2018-09-05 NOTE — ED Notes (Signed)
Pt given [place] sandwich and ginger ale, OK per Resident Winfrey MD

## 2018-09-05 NOTE — ED Triage Notes (Signed)
Pt arrived to ED because she wants a psych eval due to her feeling like she has had too many things going on in her head, she feels overwhelmed & depressed (daily crying spells) & she just does not feel emotionally stable (per pt) upon arrival her glucose is 366 & oral temp is 99.9.
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
## 2018-09-05 NOTE — ED Provider Notes (Addendum)
[place] EMERGENCY DEPARTMENT Provider Note   CSN: [id number] Arrival date & time: 09/05/18  1759    History   Chief Complaint No chief complaint on file.   HPI Sarah Davis is a 58 y.o. female with a PMH of bipolar 2 disorder, substance use disorder, and type 2 diabetes who presents with anxiety.  She says that she has a stressful environment at home and needs to get away from that.  She says that her bipolar 2 disorder is not in good control currently.  She says that she went to the hospital in [facility] and walked 23 miles home because she did not have a ride.  She denies suicidal ideation, homicidal ideation, and hallucinations.  She says that she has been taking her Seroquel and lamotrigine as prescribed and denies any recent alcohol or other substance use.  She is also taking Januvia for her diabetes.  She denies chest pain, shortness of breath, fever, dysuria, or abdominal pain.      Past Medical History:  Diagnosis Date  . Bipolar 2 disorder ([place])   . Stroke [facility])     Patient Active Problem List   Diagnosis Date Noted  . Polysubstance (excluding opioids) dependence, daily use ([place]) 06/20/2017  . Chronic hepatitis C without hepatic coma ([place]) 02/28/2016  . Bipolar disorder ([place]) 02/28/2016  . Dyslipidemia 02/28/2016    Past Surgical History:  Procedure Laterality Date  . CHOLECYSTECTOMY       OB History   No obstetric history on file.      Home Medications    Prior to Admission medications   Medication Sig Start Date End Date Taking? Authorizing Provider  atorvastatin (LIPITOR) 80 MG tablet Take 80 mg by mouth daily.    [provider]  gabapentin (NEURONTIN) 300 MG capsule Take 300-600 mg by mouth at bedtime.     [provider]  hydrOXYzine (ATARAX/VISTARIL) 50 MG tablet Take 50 mg by mouth 3 (three) times daily as needed for anxiety.  04/30/17   [provider]  lamoTRIgine (LAMICTAL) 100 MG tablet Take 100  mg by mouth at bedtime. 05/14/17   [provider]  metFORMIN (GLUCOPHAGE) 500 MG tablet Take 1 tablet (500 mg total) by mouth 2 (two) times daily with a meal. 06/20/17   Lord, Asa Saunas, NP  potassium chloride SA (K-DUR,KLOR-CON) 20 MEQ tablet Take 2 tablets (40 mEq total) by mouth once for 1 dose. 06/20/17 06/20/17  Patrecia Pour, NP  QUEtiapine (SEROQUEL) 100 MG tablet Take 1 tablet (100 mg total) by mouth 2 (two) times daily. 06/20/17   Patrecia Pour, NP    Family History History reviewed. No pertinent family history.  Social History Social History   Tobacco Use  . Smoking status: Current Every Day Smoker    Packs/day: 0.50    Start date: 02/18/1994  . Smokeless tobacco: Never Used  Substance Use Topics  . Alcohol use: No  . Drug use: No     Allergies   Lithium, Tramadol, Codeine, and Sulfa antibiotics   Review of Systems Review of Systems  Constitutional: Negative for fever.  HENT: Negative for congestion.   Respiratory: Negative for cough and shortness of breath.   Cardiovascular: Negative for chest pain.  Gastrointestinal: Positive for nausea. Negative for abdominal pain and diarrhea.  Genitourinary: Negative for dysuria.  Musculoskeletal: Negative for back pain.  Skin: Negative for rash.  Neurological: Negative for weakness and headaches.  Psychiatric/Behavioral: Negative for hallucinations, self-injury and  suicidal ideas. The patient is nervous/anxious.      Physical Exam Updated Vital Signs BP 115/89   Pulse 76   Temp 98.5 F (36.9 C) (Oral)   Resp 18   Ht 5\' 5"  (1.651 m)   Wt 56.7 kg   SpO2 99%   BMI 20.80 kg/m   Physical Exam Constitutional:      General: She is not in acute distress.    Appearance: She is not ill-appearing.  HENT:     Head: Normocephalic and atraumatic.     Right Ear: External ear normal.     Left Ear: External ear normal.     Nose: Nose normal. No rhinorrhea.     Mouth/Throat:     Mouth: Mucous membranes are dry.      Pharynx: No posterior oropharyngeal erythema.  Eyes:     Extraocular Movements: Extraocular movements intact.     Conjunctiva/sclera: Conjunctivae normal.     Pupils: Pupils are equal, round, and reactive to light.  Neck:     Musculoskeletal: Normal range of motion and neck supple.  Cardiovascular:     Rate and Rhythm: Normal rate and regular rhythm.     Heart sounds: No murmur.  Pulmonary:     Effort: Pulmonary effort is normal. No respiratory distress.     Breath sounds: Normal breath sounds.  Abdominal:     General: Abdomen is flat. Bowel sounds are normal.     Palpations: Abdomen is soft.  Musculoskeletal: Normal range of motion.        General: No swelling.     Right lower leg: No edema.     Left lower leg: No edema.  Skin:    General: Skin is warm.     Findings: No bruising.  Neurological:     General: No focal deficit present.     Mental Status: She is alert and oriented to person, place, and time.  Psychiatric:        Thought Content: Thought content normal.     Comments: Anxious mood and affect      ED Treatments / Results  Labs (all labs ordered are listed, but only abnormal results are displayed) Labs Reviewed  BASIC METABOLIC PANEL - Abnormal; Notable for the following components:      Result Value   Glucose, Bld 283 (*)    Creatinine, Ser 1.78 (*)    Calcium 8.5 (*)    GFR calc non Af Amer 31 (*)    GFR calc Af Amer 36 (*)    All other components within normal limits  CBC WITH DIFFERENTIAL/PLATELET - Abnormal; Notable for the following components:   Eosinophils Absolute 0.6 (*)    Abs Immature Granulocytes 0.12 (*)    All other components within normal limits    EKG None  Radiology No results found.  Procedures Procedures (including critical care time)  Medications Ordered in ED Medications  sodium chloride 0.9 % bolus 1,000 mL (has no administration in time range)     Initial Impression / Assessment and Plan / ED Course  I have reviewed  the triage vital signs and the nursing notes.  Pertinent labs & imaging results that were available during my care of the patient were reviewed by me and considered in my medical decision making (see chart for details).       Since patient is not actively manic, is not hallucinating, and is not a danger to herself or others, she does not meet criteria for behavioral  health admission.  This was discussed with the patient, and she became angry and threatened to "change my story" in order to be admitted.  I encouraged her to contact her counselor at [facility] on Monday, but she said that her counselor was the person who told her to go to the emergency department.  I apologized for the frustration that she must be feeling but reiterated that she unfortunately cannot be admitted.  We did offer her a telepsych assessment in the ED, to which she was agreeable.   TTS evaluated patient and determined that she would otherwise qualify for admission, but since her blood sugar was >300 initially, they could not accept her for observation until tomorrow.  Therefore, she will stay in the ED overnight until TTS can evaluate her tomorrow.   Final Clinical Impressions(s) / ED Diagnoses   Final diagnoses:  Anxiety    ED Discharge Orders    None       Lennox SoldersWinfrey, Dhana Totton C, MD 09/05/18 2231    Lennox SoldersWinfrey, Seddrick Flax C, MD 09/05/18 [phone]    Blane OharaZavitz, Joshua, MD 09/06/18 [phone]

## 2018-09-06 ENCOUNTER — Observation Stay (HOSPITAL_COMMUNITY): Admission: AD | Admit: 2018-09-06 | Payer: Medicaid Other | Source: Intra-hospital | Admitting: Psychiatry

## 2018-09-06 DIAGNOSIS — F331 Major depressive disorder, recurrent, moderate: Secondary | ICD-10-CM | POA: Diagnosis present

## 2018-09-06 DIAGNOSIS — F329 Major depressive disorder, single episode, unspecified: Secondary | ICD-10-CM

## 2018-09-06 DIAGNOSIS — Z590 Homelessness: Secondary | ICD-10-CM

## 2018-09-06 LAB — CBG MONITORING, ED
Glucose-Capillary: 218 mg/dL — ABNORMAL HIGH (ref 70–99)
Glucose-Capillary: 262 mg/dL — ABNORMAL HIGH (ref 70–99)
Glucose-Capillary: 336 mg/dL — ABNORMAL HIGH (ref 70–99)

## 2018-09-06 LAB — SARS CORONAVIRUS 2 BY RT PCR (HOSPITAL ORDER, PERFORMED IN ~~LOC~~ HOSPITAL LAB): SARS Coronavirus 2: NEGATIVE

## 2018-09-06 LAB — ETHANOL: Alcohol, Ethyl (B): <10 mg/dL (ref <10)

## 2018-09-06 MED ORDER — SODIUM CHLORIDE 0.9 % IV BOLUS (SEPSIS)
1000.0000 mL | Freq: Once | INTRAVENOUS | Status: AC
  Administered 2018-09-06: 1000 mL via INTRAVENOUS

## 2018-09-06 MED ORDER — INSULIN ASPART 100 UNIT/ML ~~LOC~~ SOLN
5.0000 [IU] | Freq: Once | SUBCUTANEOUS | Status: AC
  Administered 2018-09-06: 5 [IU] via SUBCUTANEOUS

## 2018-09-06 NOTE — ED Notes (Signed)
Report given to [facility] at [facility], [place] called for transport

## 2018-09-06 NOTE — ED Provider Notes (Signed)
Emergency Medicine Observation Re-evaluation Note  Sarah Davis is a 58 y.o. female, seen on rounds today.  Pt initially presented to the ED for complaints of No chief complaint on file.   Physical Exam  BP (!) 147/88 (BP Location: Right Arm)   Pulse 83   Temp 97.8 F (36.6 C) (Oral)   Resp 16   Ht 5\' 5"  (1.651 m)   Wt 56.7 kg   SpO2 94%   BMI 20.80 kg/m  Physical Exam  ED Course / MDM  EKG:    I have reviewed the labs performed to date as well as medications administered while in observation.  Recent changes in the last 24 hours include tts consult Plan  Current plan is for discharge. Patient is not under full IVC at this time. Patient has a TTS evaluation and does not meet criteria for inpatient treatment.   Hayden Rasmussen, MD 09/06/18 1122

## 2018-09-06 NOTE — ED Notes (Signed)
Pt not happy about not being admitted to [facility]. Pt dressed and wheeled out to lobby. Given Fresh mask so she could have access to the bus. Pt given food to take with her and 2X drinks.

## 2018-09-06 NOTE — ED Notes (Signed)
[facility] notified of BS of 218. AC will return call with room #

## 2018-09-06 NOTE — BHH Counselor (Signed)
  REASSESSMENT  [facility] re-evaluated pt for safety and stability.  Pt was observed sitting up in the bed, alert, oriented x3.Sarah Davis  Pt denies SI/HI/A/V-hallucination.  Pt states "I'm still depressed."  When asked on a scale of 1-10 how do she rate her depression?  Pt states "I'm on a scale of 8. I feel worse than I did when I came in last night."    Arena Lindahl L. [place], [place], [facility], [facility] Therapeutic Triage Specialist  [phone]

## 2018-09-06 NOTE — Discharge Instructions (Signed)
Please follow up with [facility] regarding your symptoms

## 2018-09-06 NOTE — ED Notes (Signed)
Pt speaking with TTS for re-evaluation

## 2018-09-06 NOTE — ED Notes (Signed)
Per [facility] AC pt is accepted to OBS unit but needs insulin coverage orders.

## 2018-09-06 NOTE — Consult Note (Signed)
Telepsych Consultation   Reason for Consult:  Depression, homeless Referring Physician:  EDP Location of Patient:  Location of Provider: [facility] Department  Patient Identification: Sarah Davis MRN:  [id number] Principal Diagnosis: <principal problem not specified> Diagnosis:  Active Problems:   * No active hospital problems. *   Total Time spent with patient: 30 minutes  Subjective:   Sarah Davis is a 58 y.o. female patient reports that she came to the hospital because she is having a lot of family issues and is causing her to feel depressed.  She states that she can no longer stay with any of them and technically now she is homeless.  She states that she stayed with a friend for a couple of days and then went to [facility] and was seen there and was told that because she was not suicidal and they could not help her.  She states that she received numerous resources.  She states that the resources are no good to her because she refuses to go and stay at a facility that has a lot of people in it.  She states "I did that 25 years ago when I am not doing it again.".  Patient denies any suicidal or homicidal ideations and denies any hallucinations.  Patient is offered more resources and patient states that she does not need any more resources that she wants to stay in the hospital.  She reports that she has a sister that works with the [employer] health system by the name of [name] Haithcox but cannot tell me which department she works and or what she does through the [employer] system.  She states that she had contacted her sister 1 time at work and she was told to never call her at work again.  She states that she has not been get in touch with anyone except for 1 daughter who keeps harassing her.  She continues to say that she just needs to be in the hospital but cannot specify exactly what she thinks that she needs help with except for housing.  Patient is informed that she does not meet  criteria and patient states that "if I have to leave this hospital than I will sit in the lobby into the police pick me up at least then I will have 3 hots and a cot."  HPI:  58 y.o. female who presents unaccompanied to [facility] reporting severe depressive symptoms. Pt says she is diagnosed with bipolar II disorder and receives medication management through [facility]. She says has been severely depressed for the past 10 days and describes her mood as "horrible." Pt is very tearful. She says she went to [facility] and was told "if you aren't suicidal you can't get help." She says she walked 23 miles from [place] to [place] seeking help because no one would give her a ride. Pt acknowledges symptoms including crying spells, social withdrawal, loss of interest in usual pleasures, fatigue, irritability, decreased concentration, decreased sleep, decreased appetite and feelings of worthlessness and hopelessness. Pt says she has racing thoughts. She says her moods are erratic and she feels overwhelmed. She denies current suicidal ideation or history of suicide attempts, stating "I would never do that. I'm afraid of dying." She denies current homicidal ideation or history of violence. She denies auditory or visual hallucinations. Pt reports she occasionally uses marijuana. She denies abuse of alcohol or other substance use. Pt identifies family conflicts as her primary stressor. She says she has stayed  with different family members but they call her bipolar and blame her for things. She says she was staying with her daughter and grandchild "but with COVID restrictions and it was too much." Pt says her children will not speak to her.Pt says one of her sisters died in Pt's arms in September of 2019 and this is still bother Pt. She says her family is verbally abusive to her. She denies legal problems. She denies access to firearms.  Patient is seen by me via tele-psych and have consulted with Dr. Lucianne MussKumar.   Patient adamantly denies any suicidal or homicidal ideations and denies any hallucinations.  Patient does report that most of her issues are from her family drama.  Patient has a Therapist, sportspsychiatrist at [facility] currently. Patient does appear depressed and is tearful at times when discussing family issues.  However at this time the patient does not meet inpatient criteria and is recommended for outpatient resources.  Patient is psychiatrically cleared.  I have contacted Dr. Charm BargesButler and notified him of the recommendations.  Past Psychiatric History: Bipolar, history of substance abuse, MDD  Risk to Self: Suicidal Ideation: No Suicidal Intent: No Is patient at risk for suicide?: No Suicidal Plan?: No Access to Means: No What has been your use of drugs/alcohol within the last 12 months?: Pt reports occasional marijuana use How many times?: 0 Other Self Harm Risks: None Triggers for Past Attempts: None known Intentional Self Injurious Behavior: None Risk to Others: Homicidal Ideation: No Thoughts of Harm to Others: No Current Homicidal Intent: No Current Homicidal Plan: No Access to Homicidal Means: No Identified Victim: None History of harm to others?: No Assessment of Violence: None Noted Violent Behavior Description: Pt denies history of violence Does patient have access to weapons?: No Criminal Charges Pending?: No Does patient have a court date: No Prior Inpatient Therapy: Prior Inpatient Therapy: No Prior Outpatient Therapy: Prior Outpatient Therapy: Yes Prior Therapy Dates: Current Prior Therapy Facilty/Provider(s): [facility] Reason for Treatment: Bipolar disorder Does patient have an ACCT team?: No Does patient have Intensive In-House Services?  : No Does patient have [facility] services? : No Does patient have P4CC services?: No  Past Medical History:  Past Medical History:  Diagnosis Date  . Bipolar 2 disorder (HCC)   . Stroke [facility](HCC)     Past Surgical History:  Procedure Laterality  Date  . CHOLECYSTECTOMY     Family History: History reviewed. No pertinent family history. Family Psychiatric  History: unknown Social History:  Social History   Substance and Sexual Activity  Alcohol Use No     Social History   Substance and Sexual Activity  Drug Use No    Social History   Socioeconomic History  . Marital status: Single    Spouse name: Not on file  . Number of children: Not on file  . Years of education: Not on file  . Highest education level: Not on file  Occupational History  . Not on file  Social Needs  . Financial resource strain: Not on file  . Food insecurity    Worry: Not on file    Inability: Not on file  . Transportation needs    Medical: Not on file    Non-medical: Not on file  Tobacco Use  . Smoking status: Current Every Day Smoker    Packs/day: 0.50    Start date: 02/18/1994  . Smokeless tobacco: Never Used  Substance and Sexual Activity  . Alcohol use: No  . Drug use: No  .  Sexual activity: Not Currently  Lifestyle  . Physical activity    Days per week: Not on file    Minutes per session: Not on file  . Stress: Not on file  Relationships  . Social Musicianconnections    Talks on phone: Not on file    Gets together: Not on file    Attends religious service: Not on file    Active member of club or organization: Not on file    Attends meetings of clubs or organizations: Not on file    Relationship status: Not on file  Other Topics Concern  . Not on file  Social History Narrative  . Not on file   Additional Social History:    Allergies:   Allergies  Allergen Reactions  . Lithium Anaphylaxis  . Tramadol Nausea Only  . Codeine Rash  . Sulfa Antibiotics Rash    Labs:  Results for orders placed or performed during the hospital encounter of 09/05/18 (from the past 48 hour(s))  Basic metabolic panel     Status: Abnormal   Collection Time: 09/05/18  7:49 PM  Result Value Ref Range   Sodium 138 135 - 145 mmol/L   Potassium 3.7  3.5 - 5.1 mmol/L   Chloride 107 98 - 111 mmol/L   CO2 22 22 - 32 mmol/L   Glucose, Bld 283 (H) 70 - 99 mg/dL   BUN 19 6 - 20 mg/dL   Creatinine, Ser 9.141.78 (H) 0.44 - 1.00 mg/dL   Calcium 8.5 (L) 8.9 - 10.3 mg/dL   GFR calc non Af Amer 31 (L) >60 mL/min   GFR calc Af Amer 36 (L) >60 mL/min   Anion gap 9 5 - 15    Comment: Performed at [facility], [address] [place], [place] [address]  CBC with Differential     Status: Abnormal   Collection Time: 09/05/18  7:49 PM  Result Value Ref Range   WBC 9.9 4.0 - 10.5 K/uL   RBC 4.02 3.87 - 5.11 MIL/uL   Hemoglobin 12.0 12.0 - 15.0 g/dL   HCT 62.136.9 30.836.0 - 65.746.0 %   MCV 91.8 80.0 - 100.0 fL   MCH 29.9 26.0 - 34.0 pg   MCHC 32.5 30.0 - 36.0 g/dL   RDW 84.612.8 96.211.5 - 95.215.5 %   Platelets 209 150 - 400 K/uL   nRBC 0.0 0.0 - 0.2 %   Neutrophils Relative % 57 %   Neutro Abs 5.6 1.7 - 7.7 K/uL   Lymphocytes Relative 26 %   Lymphs Abs 2.6 0.7 - 4.0 K/uL   Monocytes Relative 9 %   Monocytes Absolute 0.9 0.1 - 1.0 K/uL   Eosinophils Relative 6 %   Eosinophils Absolute 0.6 (H) 0.0 - 0.5 K/uL   Basophils Relative 1 %   Basophils Absolute 0.1 0.0 - 0.1 K/uL   Immature Granulocytes 1 %   Abs Immature Granulocytes 0.12 (H) 0.00 - 0.07 K/uL    Comment: Performed at [facility], [address] [place], [place] [address]  Ethanol     Status: None   Collection Time: 09/06/18 12:14 AM  Result Value Ref Range   Alcohol, Ethyl (B) <10 <10 mg/dL    Comment: (NOTE) Lowest detectable limit for serum alcohol is 10 mg/dL. For medical purposes only. Performed at [facility], [address] [place], [place] [address]   CBG monitoring, ED     Status: Abnormal   Collection Time: 09/06/18 12:56  AM  Result Value Ref Range   Glucose-Capillary 336 (H) 70 - 99 mg/dL  SARS Coronavirus 2 (CEPHEID - Performed in [facility] hospital lab), Hosp Order     Status: None   Collection Time: 09/06/18  1:01 AM   Specimen: Nasopharyngeal Swab  Result  Value Ref Range   SARS Coronavirus 2 NEGATIVE NEGATIVE    Comment: (NOTE) If result is NEGATIVE SARS-CoV-2 target nucleic acids are NOT DETECTED. The SARS-CoV-2 RNA is generally detectable in upper and lower  respiratory specimens during the acute phase of infection. The lowest  concentration of SARS-CoV-2 viral copies this assay can detect is 250  copies / mL. A negative result does not preclude SARS-CoV-2 infection  and should not be used as the sole basis for treatment or other  patient management decisions.  A negative result may occur with  improper specimen collection / handling, submission of specimen other  than nasopharyngeal swab, presence of viral mutation(s) within the  areas targeted by this assay, and inadequate number of viral copies  (<250 copies / mL). A negative result must be combined with clinical  observations, patient history, and epidemiological information. If result is POSITIVE SARS-CoV-2 target nucleic acids are DETECTED. The SARS-CoV-2 RNA is generally detectable in upper and lower  respiratory specimens dur ing the acute phase of infection.  Positive  results are indicative of active infection with SARS-CoV-2.  Clinical  correlation with patient history and other diagnostic information is  necessary to determine patient infection status.  Positive results do  not rule out bacterial infection or co-infection with other viruses. If result is PRESUMPTIVE POSTIVE SARS-CoV-2 nucleic acids MAY BE PRESENT.   A presumptive positive result was obtained on the submitted specimen  and confirmed on repeat testing.  While 2019 novel coronavirus  (SARS-CoV-2) nucleic acids may be present in the submitted sample  additional confirmatory testing may be necessary for epidemiological  and / or clinical management purposes  to differentiate between  SARS-CoV-2 and other Sarbecovirus currently known to infect humans.  If clinically indicated additional testing with an  alternate test  methodology [phone]([id number]) is advised. The SARS-CoV-2 RNA is generally  detectable in upper and lower respiratory sp ecimens during the acute  phase of infection. The expected result is Negative. Fact Sheet for Patients:  BoilerBrush.com.cyhttps://www.fda.gov/media/136312/download Fact Sheet for Healthcare Providers: https://pope.com/https://www.fda.gov/media/136313/download This test is not yet approved or cleared by the [place] FDA and has been authorized for detection and/or diagnosis of SARS-CoV-2 by FDA under an Emergency Use Authorization (EUA).  This EUA will remain in effect (meaning this test can be used) for the duration of the COVID-19 declaration under Section 564(b)(1) of the Act, 21 U.S.C. section 360bbb-3(b)(1), unless the authorization is terminated or revoked sooner. Performed at [facility], [address] [place], [place] [address]   CBG monitoring, ED     Status: Abnormal   Collection Time: 09/06/18  4:05 AM  Result Value Ref Range   Glucose-Capillary 262 (H) 70 - 99 mg/dL  CBG monitoring, ED     Status: Abnormal   Collection Time: 09/06/18  5:12 AM  Result Value Ref Range   Glucose-Capillary 218 (H) 70 - 99 mg/dL    Medications:  No current facility-administered medications for this encounter.    Current Outpatient Medications  Medication Sig Dispense Refill  . atorvastatin (LIPITOR) 80 MG tablet Take 80 mg by mouth daily.    Marland Kitchen. gabapentin (NEURONTIN) 300 MG capsule Take 300-600 mg by mouth  at bedtime.     . hydrOXYzine (ATARAX/VISTARIL) 50 MG tablet Take 50 mg by mouth 3 (three) times daily as needed for anxiety.   1  . lamoTRIgine (LAMICTAL) 100 MG tablet Take 100 mg by mouth at bedtime.  0  . metFORMIN (GLUCOPHAGE) 500 MG tablet Take 1 tablet (500 mg total) by mouth 2 (two) times daily with a meal. 60 tablet 0  . potassium chloride SA (K-DUR,KLOR-CON) 20 MEQ tablet Take 2 tablets (40 mEq total) by mouth once for 1 dose. 2 tablet 0  . QUEtiapine (SEROQUEL) 100 MG  tablet Take 1 tablet (100 mg total) by mouth 2 (two) times daily. 60 tablet 0    Musculoskeletal: Strength & Muscle Tone: within normal limits Gait & Station: patient remained in bed during evaluation Patient leans: N/A  Psychiatric Specialty Exam: Physical Exam  Nursing note and vitals reviewed. Constitutional: She is oriented to person, place, and time. She appears well-developed and well-nourished.  Cardiovascular: Normal rate.  Respiratory: Effort normal.  Musculoskeletal: Normal range of motion.  Neurological: She is alert and oriented to person, place, and time.    Review of Systems  Constitutional: Negative.   HENT: Negative.   Eyes: Negative.   Respiratory: Negative.   Cardiovascular: Negative.   Gastrointestinal: Negative.   Genitourinary: Negative.   Musculoskeletal: Negative.   Skin: Negative.   Neurological: Negative.   Endo/Heme/Allergies: Negative.   Psychiatric/Behavioral: Positive for depression. Negative for hallucinations, substance abuse and suicidal ideas. The patient is not nervous/anxious.     Blood pressure (!) 147/88, pulse 83, temperature 97.8 F (36.6 C), temperature source Oral, resp. rate 16, height  (1.651 m), weight 56.7 kg, SpO2 94 %.Body mass index is 20.8 kg/m.  General Appearance: Disheveled  Eye Contact:  Good  Speech:  Clear and Coherent and Normal Rate  Volume:  Normal  Mood:  Depressed  Affect:  Congruent and Tearful  Thought Process:  Coherent and Descriptions of Associations: Intact  Orientation:  Full (Time, Place, and Person)  Thought Content:  WDL  Suicidal Thoughts:  No  Homicidal Thoughts:  No  Memory:  Immediate;   Good Recent;   Good Remote;   Good  Judgement:  Fair  Insight:  Fair  Psychomotor Activity:  Normal  Concentration:  Concentration: Good and Attention Span: Good  Recall:  Good  Fund of Knowledge:  Good  Language:  Good  Akathisia:  No  Handed:  Right  AIMS (if indicated):     Assets:   Communication Skills Desire for Improvement Financial Resources/Insurance Housing Physical Health Transportation  ADL's:  Intact  Cognition:  WNL  Sleep:        Treatment Plan Summary: Follow up with outpatient resources  Continue current medications Provide outpatient resources for housing  Disposition: No evidence of imminent risk to self or others at present.   Patient does not meet criteria for psychiatric inpatient admission. Supportive therapy provided about ongoing stressors. Discussed crisis plan, support from social network, calling 911, coming to the Emergency Department, and calling Suicide Hotline.  This service was provided via telemedicine using a 2-way, interactive audio and video technology.  Names of all persons participating in this telemedicine service and their role in this encounter. Name: Drexel Iha Role: Patient  Name: Reola Calkins NP Role: Provider  Name:  Role:   Name:  Role:     Maryfrances Bunnell, FNP 09/06/2018 11:22 AM

## 2018-09-06 NOTE — ED Notes (Signed)
Ordered bfast 

## 2019-02-19 DIAGNOSIS — I213 ST elevation (STEMI) myocardial infarction of unspecified site: Secondary | ICD-10-CM

## 2019-02-19 HISTORY — DX: ST elevation (STEMI) myocardial infarction of unspecified site: I21.3

## 2019-11-19 DIAGNOSIS — I2510 Atherosclerotic heart disease of native coronary artery without angina pectoris: Secondary | ICD-10-CM

## 2019-11-19 HISTORY — DX: Atherosclerotic heart disease of native coronary artery without angina pectoris: I25.10

## 2019-11-24 DIAGNOSIS — I213 ST elevation (STEMI) myocardial infarction of unspecified site: Secondary | ICD-10-CM

## 2020-01-26 ENCOUNTER — Emergency Department (HOSPITAL_COMMUNITY): Payer: Medicaid Other

## 2020-01-26 ENCOUNTER — Emergency Department (HOSPITAL_COMMUNITY)
Admission: EM | Admit: 2020-01-26 | Discharge: 2020-01-26 | Disposition: A | Discharge status: Home / Self Care | Payer: Medicaid Other | Attending: Emergency Medicine | Admitting: Emergency Medicine

## 2020-01-26 ENCOUNTER — Encounter (HOSPITAL_COMMUNITY): Payer: Self-pay | Admitting: Neurology

## 2020-01-26 DIAGNOSIS — R531 Weakness: Secondary | ICD-10-CM | POA: Diagnosis present

## 2020-01-26 DIAGNOSIS — F17200 Nicotine dependence, unspecified, uncomplicated: Secondary | ICD-10-CM | POA: Insufficient documentation

## 2020-01-26 LAB — COMPREHENSIVE METABOLIC PANEL
ALT: 15 U/L (ref 0–44)
AST: 15 U/L (ref 15–41)
Albumin: 3.6 g/dL (ref 3.5–5.0)
Alkaline Phosphatase: 101 U/L (ref 38–126)
Anion gap: 10 (ref 5–15)
BUN: 17 mg/dL (ref 6–20)
CO2: 25 mmol/L (ref 22–32)
Calcium: 9.2 mg/dL (ref 8.9–10.3)
Chloride: 102 mmol/L (ref 98–111)
Creatinine, Ser: 1.83 mg/dL — ABNORMAL HIGH (ref 0.44–1.00)
GFR, Estimated: 31 mL/min — ABNORMAL LOW (ref >60)
Glucose, Bld: 153 mg/dL — ABNORMAL HIGH (ref 70–99)
Potassium: 4 mmol/L (ref 3.5–5.1)
Sodium: 137 mmol/L (ref 135–145)
Total Bilirubin: 0.4 mg/dL (ref 0.3–1.2)
Total Protein: 7.1 g/dL (ref 6.5–8.1)

## 2020-01-26 LAB — CBC WITH DIFFERENTIAL/PLATELET
Abs Immature Granulocytes: 0.05 10*3/uL (ref 0.00–0.07)
Basophils Absolute: 0.1 10*3/uL (ref 0.0–0.1)
Basophils Relative: 1 %
Eosinophils Absolute: 0.2 10*3/uL (ref 0.0–0.5)
Eosinophils Relative: 2 %
HCT: 39.9 % (ref 36.0–46.0)
Hemoglobin: 12.3 g/dL (ref 12.0–15.0)
Immature Granulocytes: 1 %
Lymphocytes Relative: 29 %
Lymphs Abs: 2.6 10*3/uL (ref 0.7–4.0)
MCH: 26.4 pg (ref 26.0–34.0)
MCHC: 30.8 g/dL (ref 30.0–36.0)
MCV: 85.6 fL (ref 80.0–100.0)
Monocytes Absolute: 0.7 10*3/uL (ref 0.1–1.0)
Monocytes Relative: 7 %
Neutro Abs: 5.6 10*3/uL (ref 1.7–7.7)
Neutrophils Relative %: 60 %
Platelets: 242 10*3/uL (ref 150–400)
RBC: 4.66 MIL/uL (ref 3.87–5.11)
RDW: 12.9 % (ref 11.5–15.5)
WBC: 9.2 10*3/uL (ref 4.0–10.5)
nRBC: 0 % (ref 0.0–0.2)

## 2020-01-26 NOTE — ED Provider Notes (Signed)
[facility] EMERGENCY DEPARTMENT Provider Note   CSN: [id number] Arrival date & time: 01/26/20  1334     History No chief complaint on file.   Sarah Davis is a 59 y.o. female.  HPI 59 year old female with history of stroke, on an unknown blood thinner per self-report, no record of this in our EMR, bipolar 2 disorder, depression, polysubstance abuse, chronic hep C, dyslipidemia presents to the ER with ongoing weakness in her lower extremity, tripping over her feet.  She also states that she has a new onset tremor.  She has a history of stroke 5 years ago, states that she was unable to talk or walk, but has since been rehabbed and is able to do this adequately.  Triage notes note that she has been having the symptoms for 3 months, however to me she endorses that it has been a week.  Denies any numbness or tingling in her extremities.  She has been compliant with her psychiatric medications, denies any recent overdoses.  Denies any fevers or chills.  Denies any loss of bowel bladder control.  Denies any facial droop, word slurring, unilateral weakness.  Denies any recent alcohol or drug use.  Past Medical History:  Diagnosis Date  . Bipolar 2 disorder (HCC)   . Stroke [facility])     Patient Active Problem List   Diagnosis Date Noted  . Depression, major, recurrent, moderate (HCC) 09/06/2018  . Polysubstance (excluding opioids) dependence, daily use (HCC) 06/20/2017  . Chronic hepatitis C without hepatic coma (HCC) 02/28/2016  . Bipolar disorder (HCC) 02/28/2016  . Dyslipidemia 02/28/2016    Past Surgical History:  Procedure Laterality Date  . CHOLECYSTECTOMY       OB History   No obstetric history on file.     History reviewed. No pertinent family history.  Social History   Tobacco Use  . Smoking status: Current Every Day Smoker    Packs/day: 0.50    Start date: 02/18/1994  . Smokeless tobacco: Never Used  Substance Use Topics  . Alcohol use: No  . Drug use:  No    Home Medications Prior to Admission medications   Medication Sig Start Date End Date Taking? Authorizing Provider  atorvastatin (LIPITOR) 80 MG tablet Take 80 mg by mouth daily.    [provider]  gabapentin (NEURONTIN) 300 MG capsule Take 300-600 mg by mouth at bedtime.     [provider]  hydrOXYzine (ATARAX/VISTARIL) 50 MG tablet Take 50 mg by mouth 3 (three) times daily as needed for anxiety.  04/30/17   [provider]  lamoTRIgine (LAMICTAL) 100 MG tablet Take 100 mg by mouth at bedtime. 05/14/17   [provider]  metFORMIN (GLUCOPHAGE) 500 MG tablet Take 1 tablet (500 mg total) by mouth 2 (two) times daily with a meal. 06/20/17   Lord, Herminio Heads, NP  potassium chloride SA (K-DUR,KLOR-CON) 20 MEQ tablet Take 2 tablets (40 mEq total) by mouth once for 1 dose. 06/20/17 06/20/17  Charm Rings, NP  QUEtiapine (SEROQUEL) 100 MG tablet Take 1 tablet (100 mg total) by mouth 2 (two) times daily. 06/20/17   Charm Rings, NP    Allergies    Lithium, Tramadol, Codeine, and Sulfa antibiotics  Review of Systems   Review of Systems  Constitutional: Negative for chills and fever.  HENT: Negative for ear pain and sore throat.   Eyes: Negative for pain and visual disturbance.  Respiratory: Negative for cough and shortness of breath.  Cardiovascular: Negative for chest pain and palpitations.  Gastrointestinal: Negative for abdominal pain and vomiting.  Genitourinary: Negative for dysuria and hematuria.  Musculoskeletal: Negative for arthralgias and back pain.  Skin: Negative for color change and rash.  Neurological: Positive for tremors and weakness. Negative for dizziness, seizures, syncope, light-headedness and headaches.  All other systems reviewed and are negative.   Physical Exam Updated Vital Signs BP (!) 158/81 (BP Location: Left Arm)   Pulse 66   Temp 98.4 F (36.9 C)   Resp 14   SpO2 97%   Physical Exam Vitals and nursing note  reviewed.  Constitutional:      General: She is not in acute distress.    Appearance: Normal appearance. She is well-developed. She is not diaphoretic.  HENT:     Head: Normocephalic and atraumatic.     Mouth/Throat:     Mouth: Mucous membranes are moist.     Pharynx: Oropharynx is clear.  Eyes:     Conjunctiva/sclera: Conjunctivae normal.  Cardiovascular:     Rate and Rhythm: Normal rate and regular rhythm.     Pulses: Normal pulses.     Heart sounds: No murmur heard.   Pulmonary:     Effort: Pulmonary effort is normal. No respiratory distress.     Breath sounds: Normal breath sounds.  Abdominal:     Palpations: Abdomen is soft.     Tenderness: There is no abdominal tenderness.  Musculoskeletal:        General: No tenderness, deformity or signs of injury.     Cervical back: Neck supple.     Left lower leg: No edema.  Skin:    General: Skin is warm and dry.  Neurological:     Mental Status: She is alert and oriented to person, place, and time.     Cranial Nerves: No cranial nerve deficit.     Sensory: No sensory deficit.     Motor: Weakness present.     Coordination: Coordination normal.     Comments: Baseline tremor to upper extremities.  Equal strength in upper extremities.  Smile equal, no noticeable facial droop.  Tongue protrusion central without fasciculations.  Eyebrow raise equal.  Pupils equal and reactive.  Full lower extremity range of motion, patient able to lift both legs without difficulty, however refusing to push against resistance and refusing to dorsiflex/plantarflex ankles. Gross sensations intact in upper and lower extremities.   Psychiatric:        Mood and Affect: Mood normal.        Behavior: Behavior normal.     ED Results / Procedures / Treatments   Labs (all labs ordered are listed, but only abnormal results are displayed) Labs Reviewed  COMPREHENSIVE METABOLIC PANEL - Abnormal; Notable for the following components:      Result Value   Glucose,  Bld 153 (*)    Creatinine, Ser 1.83 (*)    GFR, Estimated 31 (*)    All other components within normal limits  CBC WITH DIFFERENTIAL/PLATELET    EKG EKG Interpretation  Date/Time:  Wednesday January 26 2020 19:33:36 EST Ventricular Rate:  70 PR Interval:  188 QRS Duration: 86 QT Interval:  430 QTC Calculation: 464 R Axis:   68 Text Interpretation: Normal sinus rhythm Normal ECG No prior ECG for comparison. No STEMI Confirmed by Theda Belfast ([id number]) on 01/26/2020 8:16:40 PM   Radiology CT Head Wo Contrast  Result Date: 01/26/2020 CLINICAL DATA:  Right leg weakness EXAM: CT HEAD WITHOUT CONTRAST TECHNIQUE:  Contiguous axial images were obtained from the base of the skull through the vertex without intravenous contrast. COMPARISON:  None. FINDINGS: Brain: No evidence of acute territorial infarction, hemorrhage, hydrocephalus,extra-axial collection or mass lesion/mass effect. There is mild low-attenuation changes in the deep white matter consistent with small vessel ischemia. Ventricles are normal in size and contour. Vascular: No hyperdense vessel or unexpected calcification. Skull: The skull is intact. No fracture or focal lesion identified. Sinuses/Orbits: The visualized paranasal sinuses and mastoid air cells are clear. The orbits and globes intact. Other: None IMPRESSION: No acute intracranial abnormality. Findings consistent with mild chronic small vessel ischemia Electronically Signed   By: Jonna Clark M.D.   On: 01/26/2020 19:38    Procedures Procedures (including critical care time)  Medications Ordered in ED Medications - No data to display  ED Course  I have reviewed the triage vital signs and the nursing notes.  Pertinent labs & imaging results that were available during my care of the patient were reviewed by me and considered in my medical decision making (see chart for details).    MDM Rules/Calculators/A&P                          59 year old female who presents  with difficulty walking, tripping over her feet, and new onset tremor.  On presentation, she is alert, oriented, nontoxic-appearing, no acute distress, resting comfortably in the ER bed.  Vital signs overall reassuring other than some mild hypertension.  Physical exam with normal cranial nerve exam, baseline tremor to her upper extremities was noted, however equal strength and sensations.  Lower extremities with full range of motion of both lower extremities, sensations intact, however patient will not provide resistance and dorsi or plantarflex her ankles.  She denies any loss of bowel bladder control, no numbness or tingling in her extremities, no foot drop.  Labs reviewed and interpreted by me CMP and CBC largely unremarkable.  Creatinine of 1.83 but this appears to be at baseline.  CT of the head reviewed myself and radiology -Unremarkable, no evidence of acute intracranial abnormality  EKG normal sinus rhythm  MDM: Neurologic exam abnormal, but not consistent with stroke.  DDx includes psychogenic weakness, Lamictal overdose (patient takes 100 mg nightly per chart review).  Unseld to Dr. Thomasena Edis with neurology who saw and evaluated the patient.  He felt as though her symptoms were due to her depression.  Patient ambulated in the ED without difficulty.  To call and spoke with the patient's mother who states that the patient has not been moving around much, not eating, acting depressed.  Patient denies any suicidal homicidal ideations.  States that she would like to go home.  Given overall reassuring neurologic work-up, low suspicion for self-harm, patient stable to go home with psychiatric follow-up.    Case discussed with Dr. Rush Landmark who is agreeable to the plan and disposition  Final Clinical Impression(s) / ED Diagnoses Final diagnoses:  Weakness    Rx / DC Orders ED Discharge Orders    None       Leone Brand 01/26/20 2150    Tegeler, Canary Brim, MD 01/26/20 2337

## 2020-01-26 NOTE — ED Triage Notes (Signed)
Pt here from home with c/o right leg weakness times 3 months , along with some dizziness

## 2020-01-26 NOTE — ED Notes (Signed)
Pt at the charge desk stated she had talked to all the doctors and was ready to go. Notified EDP, pt then sat at chair by the charge desk, asked pt to return to her room, pt then stated "lady I'm having a hard time getting around". Pt then left the ED, ambulatory without difficulty.

## 2020-01-26 NOTE — Discharge Instructions (Addendum)
Please make sure to follow-up with your psychiatrist.  Return to the ER if you have any thoughts of harming yourself or others, auditory visual hallucinations, or any other new or concerning symptoms

## 2020-06-28 ENCOUNTER — Ambulatory Visit
Admission: RE | Admit: 2020-06-28 | Discharge: 2020-06-28 | Disposition: A | Discharge status: Home / Self Care | Payer: Medicaid Other | Source: Ambulatory Visit | Attending: Nurse Practitioner | Admitting: Nurse Practitioner

## 2020-06-28 ENCOUNTER — Other Ambulatory Visit: Payer: Self-pay | Admitting: Nurse Practitioner

## 2020-06-28 DIAGNOSIS — Z1239 Encounter for other screening for malignant neoplasm of breast: Secondary | ICD-10-CM

## 2022-08-16 IMAGING — MG MM DIGITAL SCREENING BILAT W/ TOMO AND CAD
8 series · 8 of 24 positions shown · non-contrast
Comparison: Previous exam(s).

CLINICAL DATA: Screening.

EXAM:
DIGITAL SCREENING BILATERAL MAMMOGRAM WITH TOMOSYNTHESIS AND CAD
TECHNIQUE: Bilateral screening digital craniocaudal and mediolateral oblique
mammograms were obtained. Bilateral screening digital breast
tomosynthesis was performed. The images were evaluated with
computer-aided detection.

[R CC synth-2D]
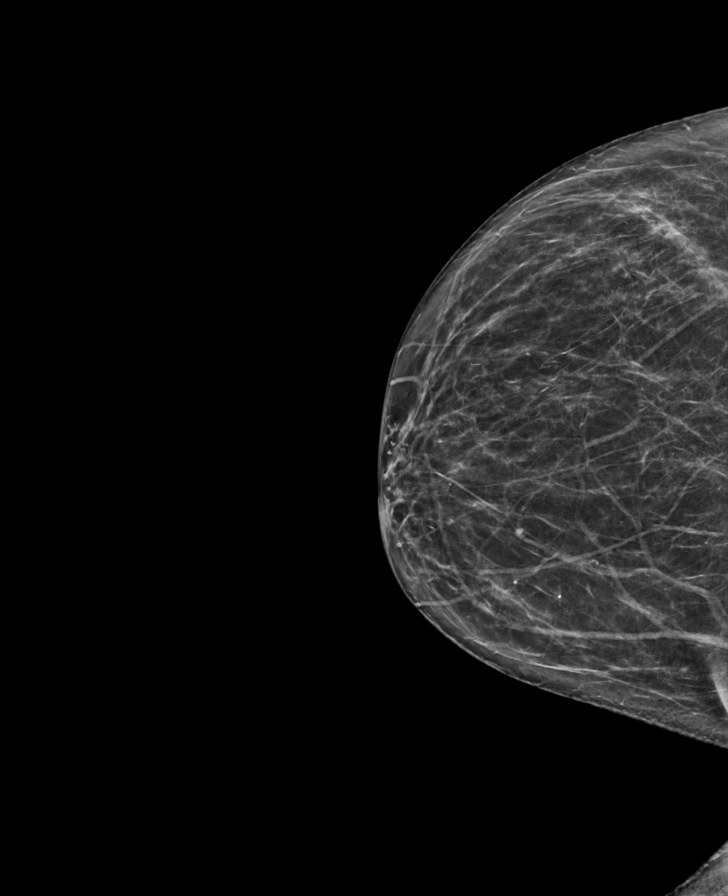

[L MLO synth-2D]
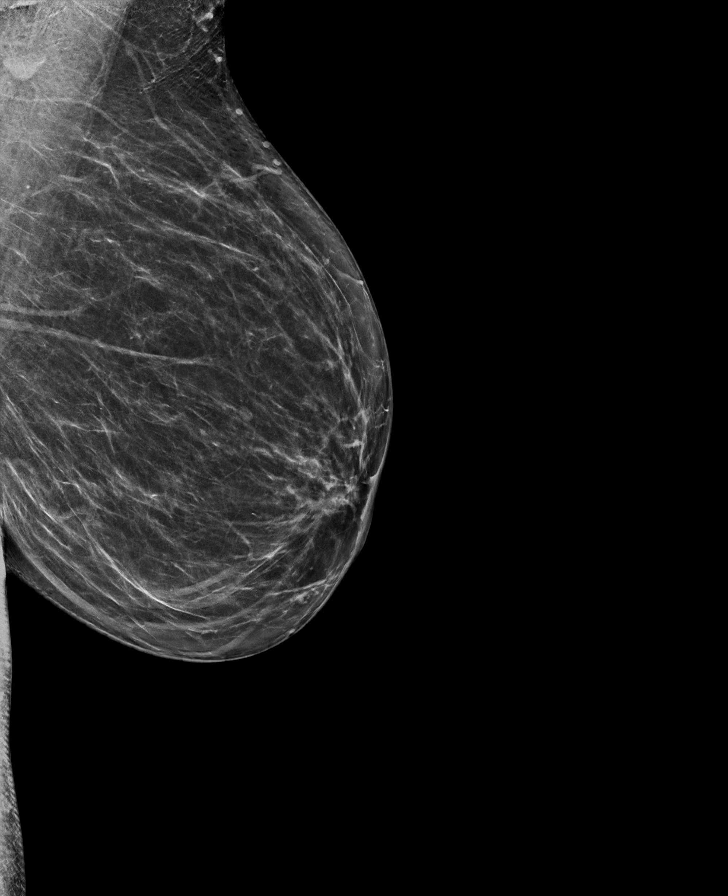

[L CC synth-2D]
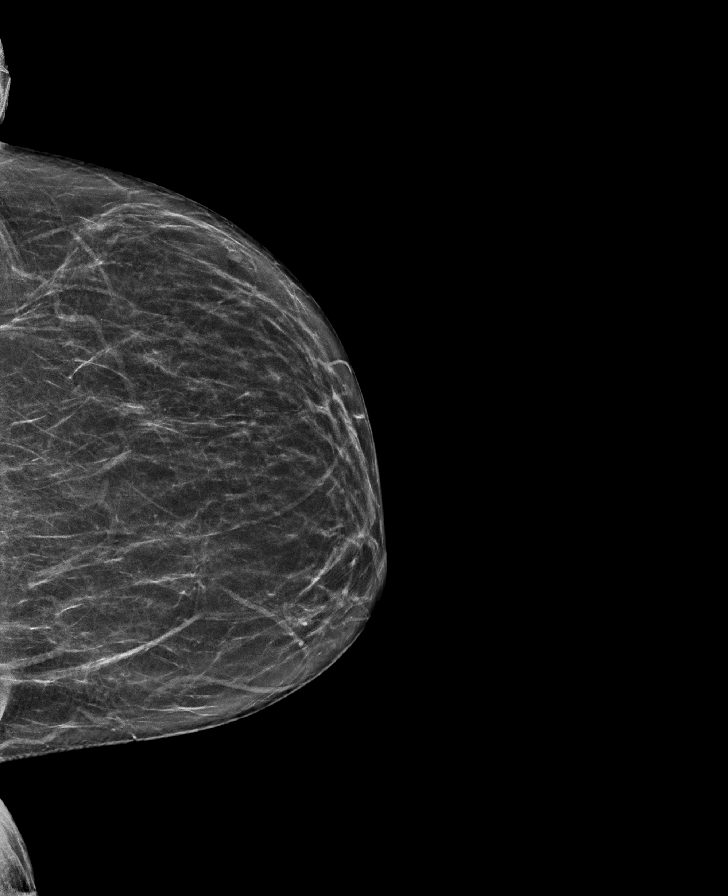

[R MLO synth-2D]
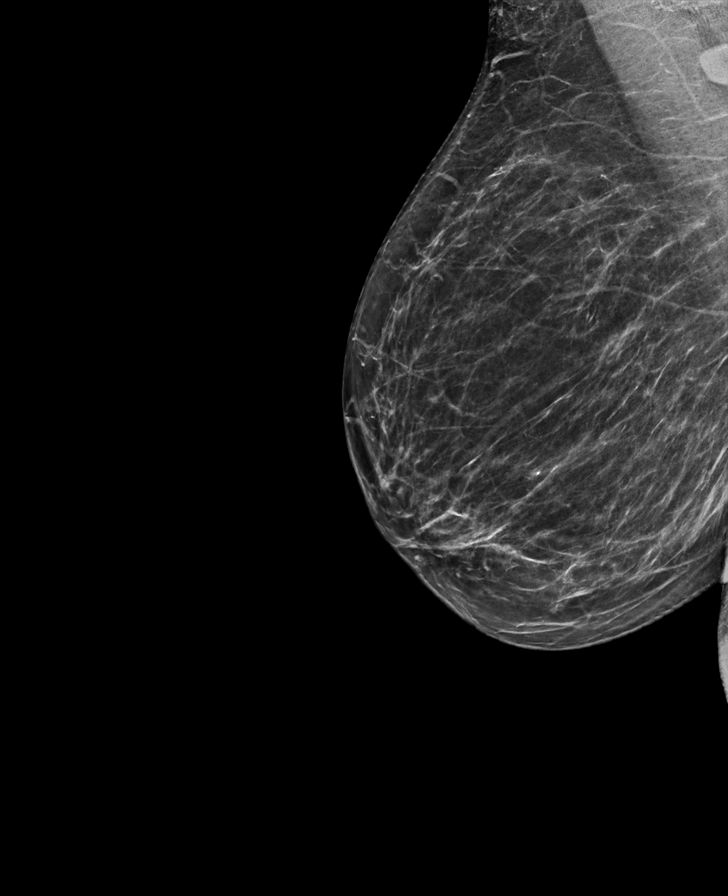

[R CC tomo · tomo slice 35/69.0]
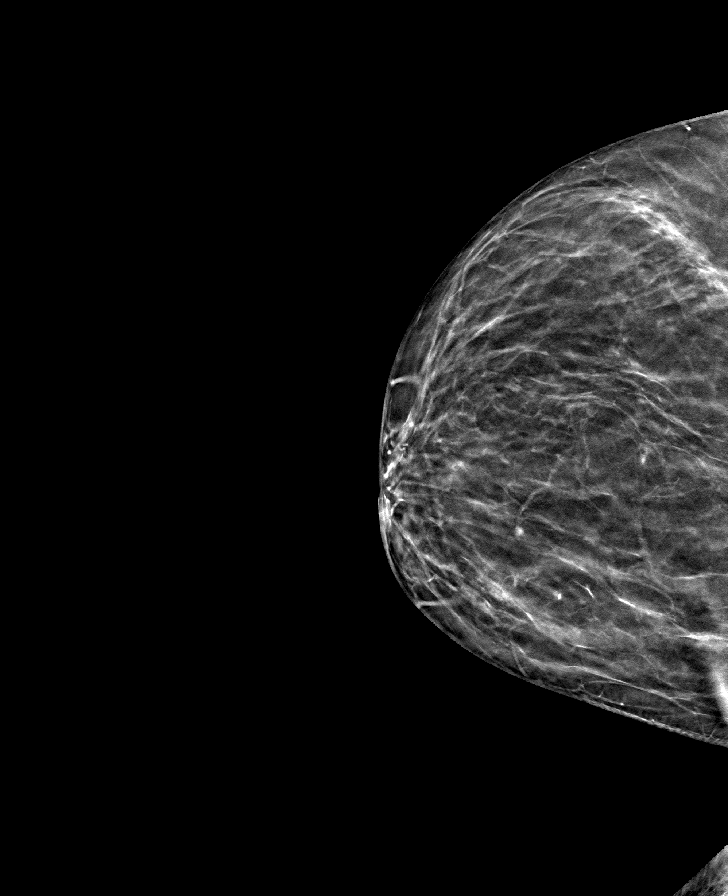

[L CC tomo · tomo slice 31/62.0]
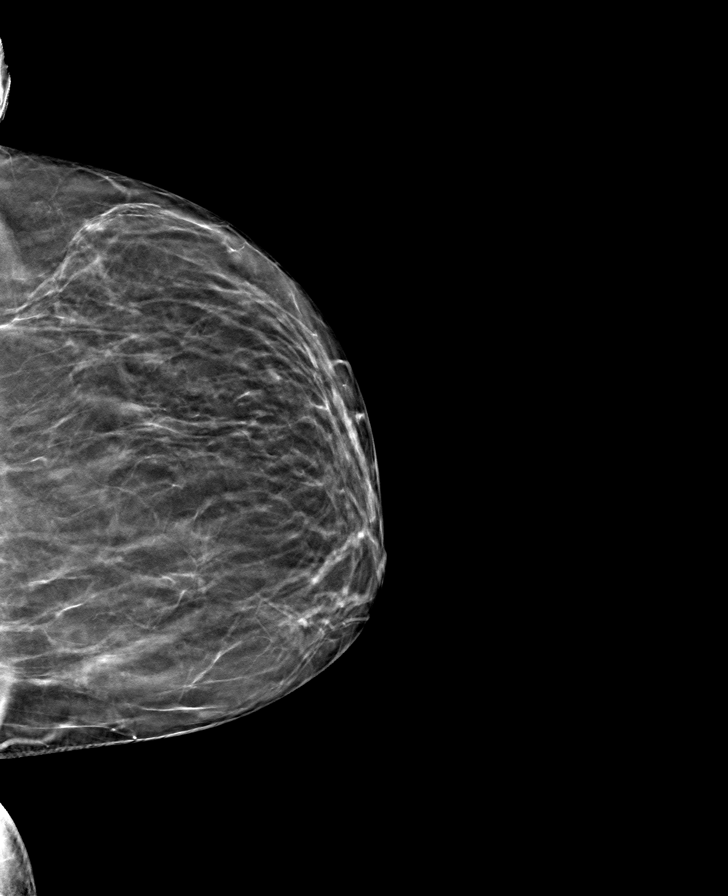

[R MLO tomo · tomo slice 35/70.0]
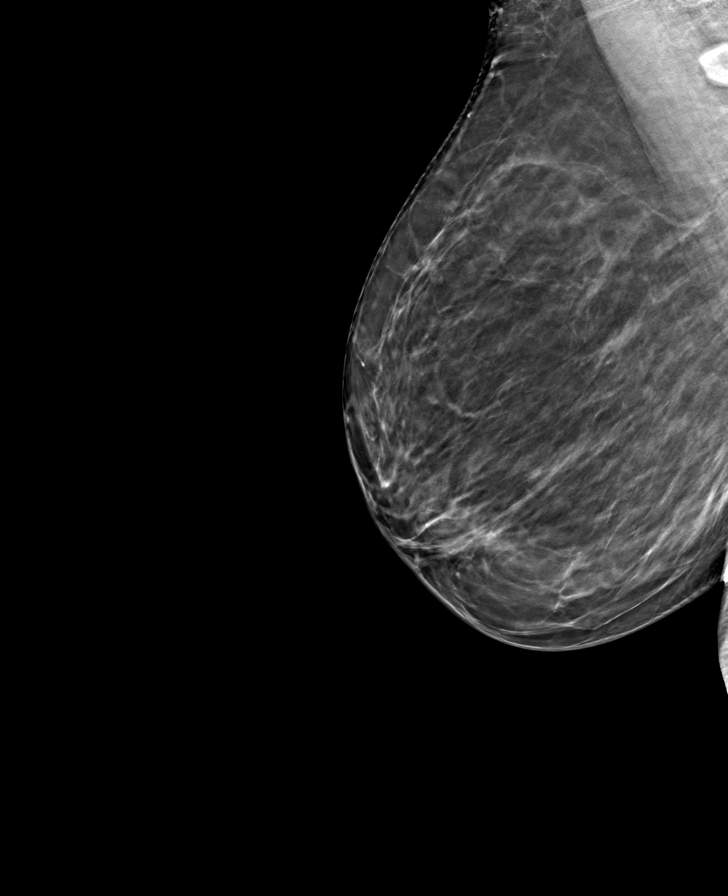

[L MLO tomo · tomo slice 35/70.0]
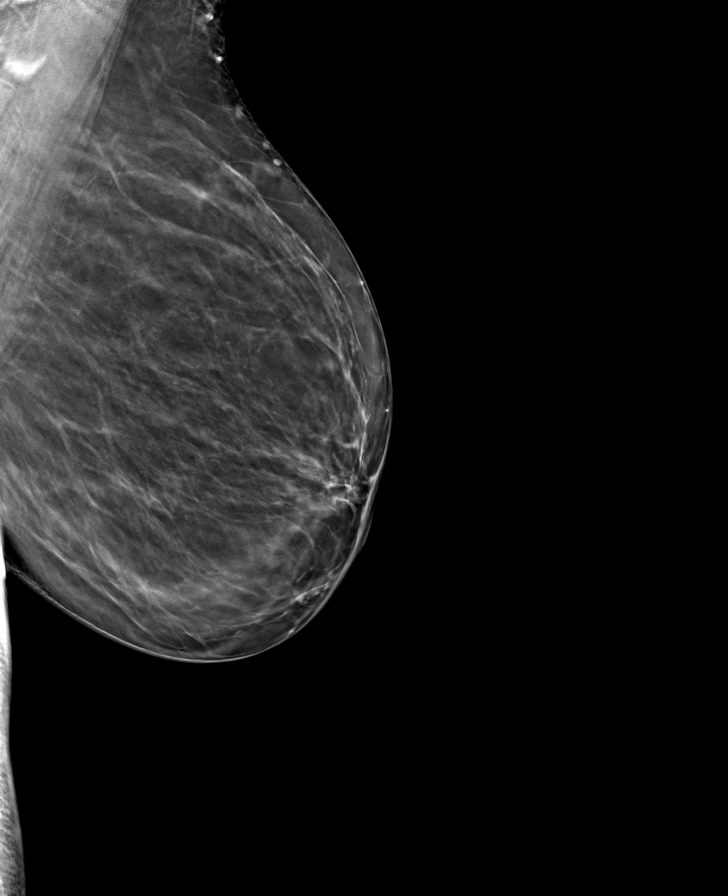

[8 of 24 positions shown; findings below may reference images not displayed]

ACR Breast Density Category b: There are scattered areas of
fibroglandular density.
FINDINGS: There are no findings suspicious for malignancy. The images were
evaluated with computer-aided detection.
IMPRESSION: No mammographic evidence of malignancy. A result letter of this
screening mammogram will be mailed directly to the patient.

RECOMMENDATION:
Screening mammogram in one year. (Code:WJ-[id number])

BI-RADS CATEGORY  1: Negative.

## 2023-07-28 DIAGNOSIS — I501 Left ventricular failure: Secondary | ICD-10-CM | POA: Diagnosis not present

## 2023-07-28 DIAGNOSIS — E785 Hyperlipidemia, unspecified: Secondary | ICD-10-CM

## 2023-07-28 DIAGNOSIS — I2510 Atherosclerotic heart disease of native coronary artery without angina pectoris: Secondary | ICD-10-CM | POA: Diagnosis not present

## 2023-07-28 DIAGNOSIS — E1165 Type 2 diabetes mellitus with hyperglycemia: Secondary | ICD-10-CM

## 2023-07-28 DIAGNOSIS — Z720 Tobacco use: Secondary | ICD-10-CM

## 2023-07-28 DIAGNOSIS — G9341 Metabolic encephalopathy: Secondary | ICD-10-CM | POA: Diagnosis not present

## 2023-07-28 DIAGNOSIS — N1832 Chronic kidney disease, stage 3b: Secondary | ICD-10-CM | POA: Diagnosis not present

## 2023-07-29 ENCOUNTER — Encounter (HOSPITAL_COMMUNITY): Payer: Self-pay

## 2023-07-29 DIAGNOSIS — G9341 Metabolic encephalopathy: Secondary | ICD-10-CM | POA: Diagnosis not present

## 2023-07-29 DIAGNOSIS — E785 Hyperlipidemia, unspecified: Secondary | ICD-10-CM | POA: Diagnosis not present

## 2023-07-29 DIAGNOSIS — N1832 Chronic kidney disease, stage 3b: Secondary | ICD-10-CM | POA: Diagnosis not present

## 2023-07-29 DIAGNOSIS — I2510 Atherosclerotic heart disease of native coronary artery without angina pectoris: Secondary | ICD-10-CM | POA: Diagnosis not present

## 2023-07-30 ENCOUNTER — Encounter (HOSPITAL_COMMUNITY): Payer: Self-pay | Admitting: Cardiovascular Disease

## 2023-07-30 ENCOUNTER — Inpatient Hospital Stay (HOSPITAL_COMMUNITY)
Admission: EM | Admit: 2023-07-30 | Discharge: 2023-09-01 | DRG: 280 | Disposition: A | Discharge status: Rehabilitation Facility | Payer: MEDICAID | Source: Other Acute Inpatient Hospital | Attending: Internal Medicine | Admitting: Internal Medicine

## 2023-07-30 DIAGNOSIS — Z8674 Personal history of sudden cardiac arrest: Secondary | ICD-10-CM

## 2023-07-30 DIAGNOSIS — I5033 Acute on chronic diastolic (congestive) heart failure: Secondary | ICD-10-CM | POA: Insufficient documentation

## 2023-07-30 DIAGNOSIS — G9341 Metabolic encephalopathy: Secondary | ICD-10-CM | POA: Diagnosis present

## 2023-07-30 DIAGNOSIS — R7989 Other specified abnormal findings of blood chemistry: Secondary | ICD-10-CM | POA: Diagnosis present

## 2023-07-30 DIAGNOSIS — L89150 Pressure ulcer of sacral region, unstageable: Secondary | ICD-10-CM | POA: Diagnosis not present

## 2023-07-30 DIAGNOSIS — Z794 Long term (current) use of insulin: Secondary | ICD-10-CM | POA: Diagnosis not present

## 2023-07-30 DIAGNOSIS — F17210 Nicotine dependence, cigarettes, uncomplicated: Secondary | ICD-10-CM | POA: Diagnosis present

## 2023-07-30 DIAGNOSIS — D696 Thrombocytopenia, unspecified: Secondary | ICD-10-CM | POA: Diagnosis not present

## 2023-07-30 DIAGNOSIS — E1143 Type 2 diabetes mellitus with diabetic autonomic (poly)neuropathy: Secondary | ICD-10-CM | POA: Diagnosis present

## 2023-07-30 DIAGNOSIS — E872 Acidosis, unspecified: Secondary | ICD-10-CM | POA: Diagnosis not present

## 2023-07-30 DIAGNOSIS — I5022 Chronic systolic (congestive) heart failure: Secondary | ICD-10-CM | POA: Diagnosis not present

## 2023-07-30 DIAGNOSIS — Z8619 Personal history of other infectious and parasitic diseases: Secondary | ICD-10-CM

## 2023-07-30 DIAGNOSIS — N390 Urinary tract infection, site not specified: Secondary | ICD-10-CM | POA: Diagnosis not present

## 2023-07-30 DIAGNOSIS — G4700 Insomnia, unspecified: Secondary | ICD-10-CM | POA: Diagnosis present

## 2023-07-30 DIAGNOSIS — I952 Hypotension due to drugs: Secondary | ICD-10-CM | POA: Diagnosis not present

## 2023-07-30 DIAGNOSIS — I5043 Acute on chronic combined systolic (congestive) and diastolic (congestive) heart failure: Secondary | ICD-10-CM | POA: Diagnosis present

## 2023-07-30 DIAGNOSIS — R5381 Other malaise: Secondary | ICD-10-CM | POA: Diagnosis not present

## 2023-07-30 DIAGNOSIS — G479 Sleep disorder, unspecified: Secondary | ICD-10-CM | POA: Diagnosis not present

## 2023-07-30 DIAGNOSIS — Y846 Urinary catheterization as the cause of abnormal reaction of the patient, or of later complication, without mention of misadventure at the time of the procedure: Secondary | ICD-10-CM | POA: Diagnosis not present

## 2023-07-30 DIAGNOSIS — Z955 Presence of coronary angioplasty implant and graft: Secondary | ICD-10-CM

## 2023-07-30 DIAGNOSIS — D649 Anemia, unspecified: Secondary | ICD-10-CM | POA: Diagnosis present

## 2023-07-30 DIAGNOSIS — E039 Hypothyroidism, unspecified: Secondary | ICD-10-CM | POA: Diagnosis present

## 2023-07-30 DIAGNOSIS — F3181 Bipolar II disorder: Secondary | ICD-10-CM | POA: Diagnosis present

## 2023-07-30 DIAGNOSIS — Z743 Need for continuous supervision: Secondary | ICD-10-CM | POA: Diagnosis not present

## 2023-07-30 DIAGNOSIS — J449 Chronic obstructive pulmonary disease, unspecified: Secondary | ICD-10-CM | POA: Diagnosis present

## 2023-07-30 DIAGNOSIS — W010XXA Fall on same level from slipping, tripping and stumbling without subsequent striking against object, initial encounter: Secondary | ICD-10-CM | POA: Diagnosis not present

## 2023-07-30 DIAGNOSIS — E7220 Disorder of urea cycle metabolism, unspecified: Secondary | ICD-10-CM | POA: Diagnosis not present

## 2023-07-30 DIAGNOSIS — T83511A Infection and inflammatory reaction due to indwelling urethral catheter, initial encounter: Secondary | ICD-10-CM | POA: Diagnosis not present

## 2023-07-30 DIAGNOSIS — E440 Moderate protein-calorie malnutrition: Secondary | ICD-10-CM | POA: Diagnosis present

## 2023-07-30 DIAGNOSIS — Z79890 Hormone replacement therapy (postmenopausal): Secondary | ICD-10-CM

## 2023-07-30 DIAGNOSIS — G40909 Epilepsy, unspecified, not intractable, without status epilepticus: Secondary | ICD-10-CM | POA: Diagnosis not present

## 2023-07-30 DIAGNOSIS — N1832 Chronic kidney disease, stage 3b: Secondary | ICD-10-CM | POA: Insufficient documentation

## 2023-07-30 DIAGNOSIS — I429 Cardiomyopathy, unspecified: Secondary | ICD-10-CM | POA: Diagnosis present

## 2023-07-30 DIAGNOSIS — I69351 Hemiplegia and hemiparesis following cerebral infarction affecting right dominant side: Secondary | ICD-10-CM | POA: Diagnosis not present

## 2023-07-30 DIAGNOSIS — Z681 Body mass index (BMI) 19 or less, adult: Secondary | ICD-10-CM | POA: Diagnosis not present

## 2023-07-30 DIAGNOSIS — R404 Transient alteration of awareness: Secondary | ICD-10-CM | POA: Diagnosis not present

## 2023-07-30 DIAGNOSIS — Z79899 Other long term (current) drug therapy: Secondary | ICD-10-CM

## 2023-07-30 DIAGNOSIS — I501 Left ventricular failure: Secondary | ICD-10-CM | POA: Diagnosis not present

## 2023-07-30 DIAGNOSIS — R339 Retention of urine, unspecified: Secondary | ICD-10-CM | POA: Diagnosis not present

## 2023-07-30 DIAGNOSIS — N1831 Chronic kidney disease, stage 3a: Secondary | ICD-10-CM | POA: Diagnosis present

## 2023-07-30 DIAGNOSIS — I2510 Atherosclerotic heart disease of native coronary artery without angina pectoris: Secondary | ICD-10-CM | POA: Diagnosis present

## 2023-07-30 DIAGNOSIS — G9340 Encephalopathy, unspecified: Secondary | ICD-10-CM | POA: Diagnosis not present

## 2023-07-30 DIAGNOSIS — R609 Edema, unspecified: Secondary | ICD-10-CM | POA: Diagnosis not present

## 2023-07-30 DIAGNOSIS — E1165 Type 2 diabetes mellitus with hyperglycemia: Secondary | ICD-10-CM | POA: Diagnosis not present

## 2023-07-30 DIAGNOSIS — J9601 Acute respiratory failure with hypoxia: Secondary | ICD-10-CM | POA: Diagnosis not present

## 2023-07-30 DIAGNOSIS — F411 Generalized anxiety disorder: Secondary | ICD-10-CM | POA: Diagnosis present

## 2023-07-30 DIAGNOSIS — D72829 Elevated white blood cell count, unspecified: Secondary | ICD-10-CM | POA: Diagnosis not present

## 2023-07-30 DIAGNOSIS — R569 Unspecified convulsions: Secondary | ICD-10-CM | POA: Diagnosis not present

## 2023-07-30 DIAGNOSIS — I959 Hypotension, unspecified: Secondary | ICD-10-CM | POA: Diagnosis not present

## 2023-07-30 DIAGNOSIS — G7281 Critical illness myopathy: Secondary | ICD-10-CM | POA: Diagnosis not present

## 2023-07-30 DIAGNOSIS — R4182 Altered mental status, unspecified: Secondary | ICD-10-CM | POA: Diagnosis not present

## 2023-07-30 DIAGNOSIS — I214 Non-ST elevation (NSTEMI) myocardial infarction: Secondary | ICD-10-CM | POA: Diagnosis present

## 2023-07-30 DIAGNOSIS — H5509 Other forms of nystagmus: Secondary | ICD-10-CM | POA: Diagnosis not present

## 2023-07-30 DIAGNOSIS — Z7982 Long term (current) use of aspirin: Secondary | ICD-10-CM

## 2023-07-30 DIAGNOSIS — I252 Old myocardial infarction: Secondary | ICD-10-CM

## 2023-07-30 DIAGNOSIS — I130 Hypertensive heart and chronic kidney disease with heart failure and stage 1 through stage 4 chronic kidney disease, or unspecified chronic kidney disease: Secondary | ICD-10-CM | POA: Diagnosis present

## 2023-07-30 DIAGNOSIS — I5021 Acute systolic (congestive) heart failure: Secondary | ICD-10-CM

## 2023-07-30 DIAGNOSIS — E1142 Type 2 diabetes mellitus with diabetic polyneuropathy: Secondary | ICD-10-CM | POA: Diagnosis not present

## 2023-07-30 DIAGNOSIS — E870 Hyperosmolality and hypernatremia: Secondary | ICD-10-CM | POA: Diagnosis not present

## 2023-07-30 DIAGNOSIS — Y92230 Patient room in hospital as the place of occurrence of the external cause: Secondary | ICD-10-CM | POA: Diagnosis not present

## 2023-07-30 DIAGNOSIS — I2585 Chronic coronary microvascular dysfunction: Secondary | ICD-10-CM | POA: Diagnosis not present

## 2023-07-30 DIAGNOSIS — R739 Hyperglycemia, unspecified: Secondary | ICD-10-CM | POA: Diagnosis not present

## 2023-07-30 DIAGNOSIS — N189 Chronic kidney disease, unspecified: Secondary | ICD-10-CM | POA: Diagnosis not present

## 2023-07-30 DIAGNOSIS — J9600 Acute respiratory failure, unspecified whether with hypoxia or hypercapnia: Secondary | ICD-10-CM | POA: Diagnosis not present

## 2023-07-30 DIAGNOSIS — N2581 Secondary hyperparathyroidism of renal origin: Secondary | ICD-10-CM | POA: Diagnosis present

## 2023-07-30 DIAGNOSIS — N179 Acute kidney failure, unspecified: Secondary | ICD-10-CM | POA: Diagnosis present

## 2023-07-30 DIAGNOSIS — R197 Diarrhea, unspecified: Secondary | ICD-10-CM | POA: Diagnosis not present

## 2023-07-30 DIAGNOSIS — E1122 Type 2 diabetes mellitus with diabetic chronic kidney disease: Secondary | ICD-10-CM | POA: Diagnosis present

## 2023-07-30 DIAGNOSIS — E785 Hyperlipidemia, unspecified: Secondary | ICD-10-CM | POA: Diagnosis present

## 2023-07-30 DIAGNOSIS — E11649 Type 2 diabetes mellitus with hypoglycemia without coma: Secondary | ICD-10-CM | POA: Diagnosis present

## 2023-07-30 DIAGNOSIS — E876 Hypokalemia: Secondary | ICD-10-CM | POA: Diagnosis not present

## 2023-07-30 DIAGNOSIS — T4275XA Adverse effect of unspecified antiepileptic and sedative-hypnotic drugs, initial encounter: Secondary | ICD-10-CM | POA: Diagnosis not present

## 2023-07-30 DIAGNOSIS — I2583 Coronary atherosclerosis due to lipid rich plaque: Secondary | ICD-10-CM | POA: Diagnosis not present

## 2023-07-30 DIAGNOSIS — R262 Difficulty in walking, not elsewhere classified: Secondary | ICD-10-CM | POA: Diagnosis present

## 2023-07-30 DIAGNOSIS — R0902 Hypoxemia: Secondary | ICD-10-CM | POA: Diagnosis not present

## 2023-07-30 DIAGNOSIS — Z1612 Extended spectrum beta lactamase (ESBL) resistance: Secondary | ICD-10-CM | POA: Diagnosis not present

## 2023-07-30 DIAGNOSIS — R7401 Elevation of levels of liver transaminase levels: Secondary | ICD-10-CM | POA: Diagnosis not present

## 2023-07-30 DIAGNOSIS — E119 Type 2 diabetes mellitus without complications: Secondary | ICD-10-CM

## 2023-07-30 DIAGNOSIS — R300 Dysuria: Secondary | ICD-10-CM | POA: Diagnosis not present

## 2023-07-30 HISTORY — DX: Tobacco use: Z72.0

## 2023-07-30 HISTORY — DX: Other psychoactive substance abuse, uncomplicated: F19.10

## 2023-07-30 HISTORY — DX: Chronic kidney disease, stage 4 (severe): N18.4

## 2023-07-30 HISTORY — DX: Type 2 diabetes mellitus with diabetic autonomic (poly)neuropathy: E11.43

## 2023-07-30 HISTORY — DX: Polyneuropathy, unspecified: G62.9

## 2023-07-30 HISTORY — DX: Type 2 diabetes mellitus without complications: E11.9

## 2023-07-30 HISTORY — DX: Atherosclerotic heart disease of native coronary artery without angina pectoris: I25.10

## 2023-07-30 HISTORY — DX: Cannabis abuse, uncomplicated: F12.10

## 2023-07-30 HISTORY — DX: Anxiety disorder, unspecified: F41.9

## 2023-07-30 HISTORY — DX: Drug induced subacute dyskinesia: G24.01

## 2023-07-30 HISTORY — DX: Hypothyroidism, unspecified: E03.9

## 2023-07-30 HISTORY — DX: Secondary hyperparathyroidism of renal origin: N25.81

## 2023-07-30 HISTORY — DX: Other chronic pancreatitis: K86.1

## 2023-07-30 HISTORY — DX: Chronic obstructive pulmonary disease, unspecified: J44.9

## 2023-07-30 HISTORY — DX: Toxic effect of other metals, accidental (unintentional), initial encounter: T56.891A

## 2023-07-30 HISTORY — DX: Chronic kidney disease, stage 3 unspecified: N18.30

## 2023-07-30 HISTORY — DX: Chronic viral hepatitis C: B18.2

## 2023-07-30 LAB — TROPONIN I (HIGH SENSITIVITY): Troponin I (High Sensitivity): 1235 ng/L (ref <18)

## 2023-07-30 LAB — HEPARIN LEVEL (UNFRACTIONATED): Heparin Unfractionated: 0.6 [IU]/mL (ref 0.30–0.70)

## 2023-07-30 LAB — APTT: aPTT: 132 s — ABNORMAL HIGH (ref 24–36)

## 2023-07-30 MED ORDER — TRAZODONE HCL 50 MG PO TABS
100.0000 mg | ORAL_TABLET | Freq: Every day | ORAL | Status: DC
  Administered 2023-07-30: 100 mg via ORAL
  Filled 2023-07-30: qty 1

## 2023-07-30 MED ORDER — SODIUM CHLORIDE 0.9 % WEIGHT BASED INFUSION
1.0000 mL/kg/h | INTRAVENOUS | Status: DC
  Administered 2023-07-31: 1 mL/kg/h via INTRAVENOUS

## 2023-07-30 MED ORDER — HEPARIN (PORCINE) 25000 UT/250ML-% IV SOLN
1000.0000 [IU]/h | INTRAVENOUS | Status: DC
  Administered 2023-07-30: 900 [IU]/h via INTRAVENOUS
  Filled 2023-07-30: qty 250

## 2023-07-30 MED ORDER — ACETAMINOPHEN 325 MG PO TABS
650.0000 mg | ORAL_TABLET | ORAL | Status: DC | PRN
  Filled 2023-07-30: qty 2

## 2023-07-30 MED ORDER — ONDANSETRON HCL 4 MG/2ML IJ SOLN: 4.0000 mg | Freq: Four times a day (QID) | INTRAMUSCULAR | Status: DC | PRN

## 2023-07-30 MED ORDER — LEVOTHYROXINE SODIUM 25 MCG PO TABS
75.0000 ug | ORAL_TABLET | Freq: Every day | ORAL | Status: DC
  Administered 2023-07-31: 75 ug via ORAL
  Filled 2023-07-30: qty 1

## 2023-07-30 MED ORDER — ATORVASTATIN CALCIUM 40 MG PO TABS
80.0000 mg | ORAL_TABLET | Freq: Every day | ORAL | Status: DC
  Administered 2023-07-31: 80 mg via ORAL
  Filled 2023-07-30: qty 1

## 2023-07-30 MED ORDER — ASPIRIN 81 MG PO TBEC: 81.0000 mg | DELAYED_RELEASE_TABLET | Freq: Every day | ORAL | Status: DC

## 2023-07-30 MED ORDER — NITROGLYCERIN 0.4 MG SL SUBL: 0.4000 mg | SUBLINGUAL_TABLET | SUBLINGUAL | Status: DC | PRN

## 2023-07-30 MED ORDER — ASPIRIN 81 MG PO CHEW
81.0000 mg | CHEWABLE_TABLET | ORAL | Status: AC
  Administered 2023-07-31: 81 mg via ORAL
  Filled 2023-07-30: qty 1

## 2023-07-30 MED ORDER — SODIUM CHLORIDE 0.9 % WEIGHT BASED INFUSION
3.0000 mL/kg/h | INTRAVENOUS | Status: AC
  Administered 2023-07-31: 3 mL/kg/h via INTRAVENOUS

## 2023-07-30 NOTE — H&P (Signed)
 CARDIOLOGY ADMISSION NOTE  Patient ID: Sarah Davis MRN: [id number] DOB/AGE: 05-19-1960 63 y.o.  Admit date: 07/30/2023 Primary Physician   Patient, No Pcp Per Primary Cardiologist   None Chief Complaint    CAD  HPI:   Patient has not been seen here previously records from [facility].  She has had angioplasty in the past.  Notes from 2021 demonstrate cardiac catheterization with STEMI.  She had a history of stenting to a 90% distal circumflex lesion.  Left main was normal.  LAD had first diagonal 70% stenosis.  The RCA had 90% proximal thrombotic in 90% mid stenosis.  She had stenting of this as well.  Of note her presentation was complicated by bradycardia with a V-fib arrest.  She has a history of hypertension, tobacco use, hypertension, chronic hepatitis C (treated) and dyslipidemia.  I do see that her echocardiogram in 2021 demonstrated EF of 60 to 65%.  She presented to [facility].  She been in the emergency room multiple times for altered mental status.  She was brought in on 6 9 when EMS was called.  She was noted by transport to be hypoglycemic and was treated with D10.  In the emergency room she was hypothermic.  Troponin was 0.37 trending to 0.03.  BNP was 6670.  She was admitted to the hospitalist service.  The patient has decreased oral intake but has been continuing to take her insulin .  She was treated with aspirin and heparin drip.  She was seen in consultation by cardiology.  Echocardiogram was done which demonstrated the LV apex to be hypokinetic.  EF was 35 to 40%.  There is a question Takotsubo's.  EKG on presentation did demonstrate some T wave inversion V1-V4 but no acute ST segment elevation.  She said she lives by herself.  She has not been having any chest pressure, neck or arm discomfort.  She has not been having any palpitations, presyncope or syncope.  She has not had any new shortness of breath, PND or orthopnea.  She lives by herself and takes care of her herself  household and and does like chores.  She has 2 children.  Her next of kin she identifies her mom and her sister.  Past Medical History:  Diagnosis Date   Bipolar 2 disorder (HCC)    CKD (chronic kidney disease), stage III (HCC)    COPD (chronic obstructive pulmonary disease) (HCC)    Coronary artery disease    Hypothyroid    Neuropathy    Stroke (HCC)    Tobacco abuse     Past Surgical History:  Procedure Laterality Date   CHOLECYSTECTOMY      Allergies  Allergen Reactions   Lithium Anaphylaxis   Tramadol Nausea Only   Codeine Rash   Sulfa Antibiotics Rash   No current facility-administered medications on file prior to encounter.   Current Outpatient Medications on File Prior to Encounter  Medication Sig Dispense Refill   atorvastatin  (LIPITOR) 80 MG tablet Take 80 mg by mouth daily.     gabapentin  (NEURONTIN ) 300 MG capsule Take 300-600 mg by mouth at bedtime.      Lamotrigine  200 mg nightly     Lantus Solostar 30 units subcu daily     Buspirone 15 mg 15 mg twice daily     Trazodone  40 mg nightly     Lasix  10 mg daily      Synthroid 100 mcg daily     Social History   Socioeconomic  History   Marital status: Single    Spouse name: Not on file   Number of children: Not on file   Years of education: Not on file   Highest education level: Not on file  Occupational History   Not on file  Tobacco Use   Smoking status: Every Day    Current packs/day: 0.50    Average packs/day: 0.5 packs/day for 29.4 years (14.7 ttl pk-yrs)    Types: Cigarettes    Start date: 02/18/1994   Smokeless tobacco: Never  Substance and Sexual Activity   Alcohol use: No   Drug use: No   Sexual activity: Not Currently  Other Topics Concern   Not on file  Social History Narrative   Lives alone.  Has not smoked in 7 days.    Social Drivers of Corporate investment banker Strain: Not on file  Food Insecurity: Not on file  Transportation Needs: Not on file  Physical Activity: Not on file   Stress: Not on file  Social Connections: Not on file  Intimate Partner Violence: Not on file    Family History  Problem Relation Age of Onset   Diabetes Father      ROS:    Positiv for constipation.  Otherwise as stated in the HPI negative for all other systems.e   Physical Exam: Blood pressure 103/62, temperature 98.2 F (36.8 C), temperature source Oral, resp. rate 20, SpO2 97%.  GENERAL:  Well appearing HEENT:  Pupils equal round and reactive, fundi not visualized, oral mucosa unremarkable NECK:  No jugular venous distention, waveform within normal limits, carotid upstroke brisk and symmetric, no bruits, no thyromegaly LYMPHATICS:  No cervical, inguinal adenopathy LUNGS:  Clear to auscultation bilaterally BACK:  No CVA tenderness CHEST:  Unremarkable HEART:  PMI not displaced or sustained,S1 and S2 within normal limits, no S3, no S4, no clicks, no rubs, no murmurs ABD:  Flat, positive bowel sounds normal in frequency in pitch, no bruits, no rebound, no guarding, no midline pulsatile mass, no hepatomegaly, no splenomegaly EXT:  2 plus pulses throughout, no edema, no cyanosis no clubbing SKIN:  No rashes no nodules NEURO:  Cranial nerves II through XII grossly intact, motor grossly intact throughout PSYCH:  Cognitively intact, oriented to person place and time   Labs: Pending  Radiology:  CXR: No acute disease  EKG: Not sent with the patient pending  ASSESSMENT AND PLAN:    CKD: Creatinine peaked to 1.5.  Baseline appears to be 1.2.  Check creatinine.  Limit diet.  Coronary artery disease:  Patient has mildly elevated troponin.  She has had an abnormal echocardiogram.  She has known coronary disease as described above with previous stenting.  Cardiac catheterization is indicated.  Echo is described as looking Takotsubo's and this is a new wall motion abnormality and not in keeping apparently with previous distribution.  This could be Takotsubo's but cardiac catheterization  is indicated. The patient understands that risks included but are not limited to stroke (1 in 1000), death (1 in 1000), kidney failure [usually temporary] (1 in 500), bleeding (1 in 200), allergic reaction [possibly serious] (1 in 200).  The patient understands and agrees to proceed.  For now continue aspirin and heparin.  Diabetes mellitus: Continue current therapy.  Hypoglycemia corrected.  COPD: Continue previous meds.  Counseled on smoking and marijuana cessation.  Hypothyroidism: TSH was low.  Her Synthroid was reduced in the hospital to 75 mcg daily.  Schizophrenia/bipolar: Continue previous meds.  Dyslipidemia: LDL  was most recently 30.  Continue Lipitor 80.  Cardiomyopathy: EF 35 to 40%.  As above.  BP is currently low and will inhibit med titration.  Consider low-dose ARB after catheterization.    SignedEilleen Grates 07/30/2023, 7:23 PM

## 2023-07-30 NOTE — Progress Notes (Addendum)
 ANTICOAGULATION CONSULT NOTE  Pharmacy Consult for heparin Indication: chest pain/ACS  Allergies  Allergen Reactions   Lithium Anaphylaxis   Tramadol Nausea Only   Codeine Rash   Sulfa Antibiotics Rash    Patient Measurements: Weight: 55.1 kg (121 lb 6.4 oz) Heparin Dosing Weight: 55 kg   Vital Signs: Temp: 98 F (36.7 C) (06/11 1925) Temp Source: Oral (06/11 1925) BP: 101/71 (06/11 1925) Pulse Rate: 64 (06/11 1925)  Labs: No results for input(s): HGB, HCT, PLT, APTT, LABPROT, INR, HEPARINUNFRC, HEPRLOWMOCWT, CREATININE, CKTOTAL, CKMB, TROPONINIHS in the last 72 hours.  CrCl cannot be calculated (Patient's most recent lab result is older than the maximum 21 days allowed.).  Medical History: Past Medical History:  Diagnosis Date   Bipolar 2 disorder (HCC)    CKD (chronic kidney disease), stage III (HCC)    COPD (chronic obstructive pulmonary disease) (HCC)    Coronary artery disease    Hypothyroid    Neuropathy    Stroke (HCC)    Tobacco abuse     Medications:  See MAR  Assessment: 63 yo female with PMH of STEMI s/p PCI Lcx and RCA in 11/2019 presented to [facility] with AMS 2/2 hypoglycemia found to have elevated troponins and transferred to [facility] for LHC.  Not on anticoagulation prior to admission.  Pharmacy consulted for heparin dosing.  -Heparin running at 900 units/hr upon transfer -Per paper chart review, aPTT 29 sec on 800 units/hr this morning >> 3000 unit bolus given at 1451 and heparin increased to 900 units/hr.   UPDATE - heparin level 0.60 is therapeutic on 900 units/hr.  Goal of Therapy:  Heparin level 0.3-0.7 units/ml Monitor platelets by anticoagulation protocol: Yes   Plan:  Heparin infusion 900 units/hr Daily CBC, heparin level Monitor for s/sx of bleeding  Cecillia Cogan, PharmD Clinical Pharmacist 07/30/2023  8:49 PM

## 2023-07-31 ENCOUNTER — Encounter (HOSPITAL_COMMUNITY)
Admission: EM | Disposition: A | Discharge status: Rehabilitation Facility | Payer: Self-pay | Attending: Internal Medicine

## 2023-07-31 DIAGNOSIS — I214 Non-ST elevation (NSTEMI) myocardial infarction: Secondary | ICD-10-CM | POA: Diagnosis not present

## 2023-07-31 DIAGNOSIS — I2510 Atherosclerotic heart disease of native coronary artery without angina pectoris: Secondary | ICD-10-CM | POA: Diagnosis not present

## 2023-07-31 LAB — CBC
HCT: 34 % — ABNORMAL LOW (ref 36.0–46.0)
HCT: 35.9 % — ABNORMAL LOW (ref 36.0–46.0)
Hemoglobin: 10.2 g/dL — ABNORMAL LOW (ref 12.0–15.0)
Hemoglobin: 11 g/dL — ABNORMAL LOW (ref 12.0–15.0)
MCH: 26.4 pg (ref 26.0–34.0)
MCH: 26.5 pg (ref 26.0–34.0)
MCHC: 30 g/dL (ref 30.0–36.0)
MCHC: 30.6 g/dL (ref 30.0–36.0)
MCV: 88.1 fL (ref 80.0–100.0)
MCV: 86.5 fL (ref 80.0–100.0)
Platelets: 175 10*3/uL (ref 150–400)
Platelets: 161 10*3/uL (ref 150–400)
RBC: 3.86 MIL/uL — ABNORMAL LOW (ref 3.87–5.11)
RBC: 4.15 MIL/uL (ref 3.87–5.11)
RDW: 17.4 % — ABNORMAL HIGH (ref 11.5–15.5)
RDW: 17.5 % — ABNORMAL HIGH (ref 11.5–15.5)
WBC: 9 10*3/uL (ref 4.0–10.5)
WBC: 6.9 10*3/uL (ref 4.0–10.5)
nRBC: 0 % (ref 0.0–0.2)
nRBC: 0 % (ref 0.0–0.2)

## 2023-07-31 LAB — COMPREHENSIVE METABOLIC PANEL WITH GFR
ALT: 11 U/L (ref 0–44)
AST: 21 U/L (ref 15–41)
Albumin: 2.4 g/dL — ABNORMAL LOW (ref 3.5–5.0)
Alkaline Phosphatase: 75 U/L (ref 38–126)
Anion gap: 14 (ref 5–15)
BUN: 23 mg/dL (ref 8–23)
CO2: 19 mmol/L — ABNORMAL LOW (ref 22–32)
Calcium: 8.2 mg/dL — ABNORMAL LOW (ref 8.9–10.3)
Chloride: 107 mmol/L (ref 98–111)
Creatinine, Ser: 1.67 mg/dL — ABNORMAL HIGH (ref 0.44–1.00)
GFR, Estimated: 34 mL/min — ABNORMAL LOW (ref >60)
Glucose, Bld: 217 mg/dL — ABNORMAL HIGH (ref 70–99)
Potassium: 4.8 mmol/L (ref 3.5–5.1)
Sodium: 140 mmol/L (ref 135–145)
Total Bilirubin: 0.5 mg/dL (ref 0.0–1.2)
Total Protein: 4.9 g/dL — ABNORMAL LOW (ref 6.5–8.1)

## 2023-07-31 LAB — BASIC METABOLIC PANEL WITH GFR
Anion gap: 8 (ref 5–15)
Anion gap: 11 (ref 5–15)
BUN: 17 mg/dL (ref 8–23)
BUN: 17 mg/dL (ref 8–23)
CO2: 23 mmol/L (ref 22–32)
CO2: 22 mmol/L (ref 22–32)
Calcium: 8.1 mg/dL — ABNORMAL LOW (ref 8.9–10.3)
Calcium: 8.6 mg/dL — ABNORMAL LOW (ref 8.9–10.3)
Chloride: 110 mmol/L (ref 98–111)
Chloride: 111 mmol/L (ref 98–111)
Creatinine, Ser: 1.51 mg/dL — ABNORMAL HIGH (ref 0.44–1.00)
Creatinine, Ser: 1.62 mg/dL — ABNORMAL HIGH (ref 0.44–1.00)
GFR, Estimated: 39 mL/min — ABNORMAL LOW (ref >60)
GFR, Estimated: 35 mL/min — ABNORMAL LOW (ref >60)
Glucose, Bld: 83 mg/dL (ref 70–99)
Glucose, Bld: 86 mg/dL (ref 70–99)
Potassium: 4.1 mmol/L (ref 3.5–5.1)
Potassium: 4.4 mmol/L (ref 3.5–5.1)
Sodium: 141 mmol/L (ref 135–145)
Sodium: 144 mmol/L (ref 135–145)

## 2023-07-31 LAB — HEPARIN LEVEL (UNFRACTIONATED): Heparin Unfractionated: 0.35 [IU]/mL (ref 0.30–0.70)

## 2023-07-31 LAB — GLUCOSE, CAPILLARY
Glucose-Capillary: 121 mg/dL — ABNORMAL HIGH (ref 70–99)
Glucose-Capillary: 90 mg/dL (ref 70–99)
Glucose-Capillary: 86 mg/dL (ref 70–99)
Glucose-Capillary: 74 mg/dL (ref 70–99)
Glucose-Capillary: 75 mg/dL (ref 70–99)
Glucose-Capillary: 122 mg/dL — ABNORMAL HIGH (ref 70–99)

## 2023-07-31 LAB — AMMONIA: Ammonia: 252 umol/L — ABNORMAL HIGH (ref 9–35)

## 2023-07-31 LAB — LACTIC ACID, PLASMA: Lactic Acid, Venous: 2.1 mmol/L (ref 0.5–1.9)

## 2023-07-31 SURGERY — LEFT HEART CATH AND CORONARY ANGIOGRAPHY
Anesthesia: LOCAL

## 2023-07-31 MED ORDER — SODIUM CHLORIDE 0.9 % IV SOLN: 250.0000 mL | INTRAVENOUS | Status: AC | PRN

## 2023-07-31 MED ORDER — DEXTROSE 50 % IV SOLN: 25.0000 g | Freq: Once | INTRAVENOUS | Status: AC

## 2023-07-31 MED ORDER — HEPARIN (PORCINE) IN NACL 1000-0.9 UT/500ML-% IV SOLN
INTRAVENOUS | Status: DC | PRN
  Administered 2023-07-31: 1000 mL via SURGICAL_CAVITY

## 2023-07-31 MED ORDER — LIDOCAINE HCL (PF) 1 % IJ SOLN
INTRAMUSCULAR | Status: DC | PRN
  Administered 2023-07-31: 5 mL

## 2023-07-31 MED ORDER — SODIUM CHLORIDE 0.9 % IV BOLUS: 250.0000 mL | Freq: Once | INTRAVENOUS | Status: DC

## 2023-07-31 MED ORDER — HEPARIN SODIUM (PORCINE) 1000 UNIT/ML IJ SOLN
INTRAMUSCULAR | Status: DC | PRN
Start: 2023-07-31 — End: 2023-07-31
  Administered 2023-07-31: 3000 [IU] via INTRAVENOUS

## 2023-07-31 MED ORDER — VERAPAMIL HCL 2.5 MG/ML IV SOLN
INTRAVENOUS | Status: DC | PRN
  Administered 2023-07-31: 10 mL via INTRA_ARTERIAL

## 2023-07-31 MED ORDER — SODIUM CHLORIDE 0.9 % IV SOLN: INTRAVENOUS | Status: DC

## 2023-07-31 MED ORDER — LIDOCAINE HCL (PF) 1 % IJ SOLN
INTRAMUSCULAR | Status: AC
  Filled 2023-07-31: qty 30

## 2023-07-31 MED ORDER — IOHEXOL 350 MG/ML SOLN
INTRAVENOUS | Status: DC | PRN
  Administered 2023-07-31: 30 mL

## 2023-07-31 MED ORDER — LABETALOL HCL 5 MG/ML IV SOLN: 10.0000 mg | INTRAVENOUS | Status: AC | PRN

## 2023-07-31 MED ORDER — SODIUM CHLORIDE 0.9% FLUSH: 3.0000 mL | INTRAVENOUS | Status: DC | PRN

## 2023-07-31 MED ORDER — DEXTROSE 50 % IV SOLN
INTRAVENOUS | Status: AC
  Administered 2023-07-31: 25 g via INTRAVENOUS
  Filled 2023-07-31: qty 50

## 2023-07-31 MED ORDER — SODIUM CHLORIDE 0.9% FLUSH
3.0000 mL | Freq: Two times a day (BID) | INTRAVENOUS | Status: DC
  Administered 2023-07-31 – 2023-08-04 (×8): 3 mL via INTRAVENOUS

## 2023-07-31 MED ORDER — HEPARIN SODIUM (PORCINE) 1000 UNIT/ML IJ SOLN
INTRAMUSCULAR | Status: AC
  Filled 2023-07-31: qty 10

## 2023-07-31 MED ORDER — LACTULOSE 10 GM/15ML PO SOLN
30.0000 g | Freq: Three times a day (TID) | ORAL | Status: DC
  Administered 2023-07-31: 30 g via ORAL
  Filled 2023-07-31: qty 45

## 2023-07-31 MED ORDER — HYDRALAZINE HCL 20 MG/ML IJ SOLN: 10.0000 mg | INTRAMUSCULAR | Status: AC | PRN

## 2023-07-31 MED ORDER — SODIUM CHLORIDE 0.9 % IV BOLUS
250.0000 mL | Freq: Once | INTRAVENOUS | Status: AC | PRN
Start: 2023-07-31 — End: 2023-07-31
  Administered 2023-07-31: 250 mL via INTRAVENOUS

## 2023-07-31 MED ORDER — DEXTROSE-SODIUM CHLORIDE 5-0.9 % IV SOLN: INTRAVENOUS | Status: DC

## 2023-07-31 MED ORDER — VERAPAMIL HCL 2.5 MG/ML IV SOLN
INTRAVENOUS | Status: AC
  Filled 2023-07-31: qty 2

## 2023-07-31 SURGICAL SUPPLY — 11 items
CATH 5FR JL3.5 JR4 ANG PIG MP (CATHETERS) IMPLANT
DEVICE RAD COMP TR BAND LRG (VASCULAR PRODUCTS) IMPLANT
GLIDESHEATH SLEND A-KIT 6F 22G (SHEATH) IMPLANT
GUIDEWIRE INQWIRE 1.5J.035X260 (WIRE) IMPLANT
KIT HEART LEFT (KITS) IMPLANT
PACK CARDIAC CATHETERIZATION (CUSTOM PROCEDURE TRAY) ×1 IMPLANT
SET ATX-X65L (MISCELLANEOUS) IMPLANT
SHEATH PROBE COVER 6X72 (BAG) IMPLANT
TRANSDUCER W/STOPCOCK (MISCELLANEOUS) IMPLANT
TUBING ART PRESS 72 MALE/FEM (TUBING) IMPLANT
TUBING CIL FLEX 10 FLL-RA (TUBING) IMPLANT

## 2023-07-31 NOTE — Progress Notes (Addendum)
 Date and time results received: 07/31/23 at 1840  Test: LACTIC Critical Value: 2.1  Name of Provider Notified: DR Gwynneth Lessen  Orders Received? Or Actions Taken?: NEW ORDERS RECEIVED.

## 2023-07-31 NOTE — Progress Notes (Addendum)
 ANTICOAGULATION CONSULT NOTE  Pharmacy Consult for heparin Indication: chest pain/ACS  Allergies  Allergen Reactions   Lithium Anaphylaxis   Tramadol Nausea Only   Codeine Rash   Sulfa Antibiotics Rash    Patient Measurements: Height: 5' 5 (165.1 cm) Weight: 55.1 kg (121 lb 6.4 oz) IBW/kg (Calculated) : 57 Heparin Dosing Weight: 55 kg   Vital Signs: Temp: 98 F (36.7 C) (06/12 0507) Temp Source: Oral (06/12 0507) BP: 101/61 (06/12 0507) Pulse Rate: 70 (06/12 0507)  Labs: Recent Labs    07/30/23 2017 07/30/23 2218 07/31/23 0445  HGB  --   --  10.2*  HCT  --   --  34.0*  PLT  --   --  175  APTT  --  132*  --   HEPARINUNFRC  --  0.60 0.35  CREATININE  --   --  1.51*  TROPONINIHS 1,235*  --   --     Estimated Creatinine Clearance: 33.2 mL/min (A) (by C-G formula based on SCr of 1.51 mg/dL (H)).  Medical History: Past Medical History:  Diagnosis Date   Bipolar 2 disorder (HCC)    CKD (chronic kidney disease), stage III (HCC)    COPD (chronic obstructive pulmonary disease) (HCC)    Coronary artery disease    Hypothyroid    Neuropathy    Stroke (HCC)    Tobacco abuse     Medications:  See MAR  Assessment: 63 yo female with PMH of STEMI s/p PCI Lcx and RCA in 11/2019 presented to [facility] [place] with AMS 2/2 hypoglycemia found to have elevated troponins and transferred to [facility] for LHC.  Not on anticoagulation prior to admission.  Pharmacy consulted for heparin dosing.  -Heparin running at 900 units/hr upon transfer -Per paper chart review, aPTT 29 sec on 800 units/hr this morning >> 3000 unit bolus given at 1451 and heparin increased to 900 units/hr.   Heparin level is on lower end of therapeutic range at 0.35 on UFH 9000 units/hour. CBC is stable, no signs of bleeding.  Goal of Therapy:  Heparin level 0.3-0.7 units/ml Monitor platelets by anticoagulation protocol: Yes   Plan:  Increase heparin infusion to 1000 units/hr Check 8-hour heparin level Daily CBC,  heparin level Monitor for s/sx of bleeding  Albino Alu, PharmD PGY2 Cardiology Pharmacy Resident 07/31/2023  7:02 AM

## 2023-07-31 NOTE — Interval H&P Note (Signed)
 History and Physical Interval Note:  07/31/2023 2:37 PM  Sarah Davis  has presented today for surgery, with the diagnosis of NSTEMI.  The various methods of treatment have been discussed with the patient and family. After consideration of risks, benefits and other options for treatment, the patient has consented to  Procedure(s): LEFT HEART CATH AND CORONARY ANGIOGRAPHY (N/A) as a surgical intervention.  The patient's history has been reviewed, patient examined, no change in status, stable for surgery.  I have reviewed the patient's chart and labs.  Questions were answered to the patient's satisfaction.     Prashant Glosser J Lajuane Leatham

## 2023-07-31 NOTE — Plan of Care (Signed)

## 2023-07-31 NOTE — TOC CM/SW Note (Signed)
 Transition of Care [facility]) - Inpatient Brief Assessment   Patient Details  Name: Sarah Davis MRN: [id number] Date of Birth: 1960-10-23  Transition of Care [facility]) CM/SW Contact:    Cosimo Diones, RN Phone Number: 07/31/2023, 11:23 AM   Clinical Narrative: Patient presented for chest pain- Nstemi. Transfer from [facility]. Patient's mother is at the bedside during the visit. PTA patient states she lives alone in an apartment. Patient has PCP in [place] and she states her best friend Sammie Crigler takes her to appointments and the grocery store. Patient uses [facility] [address] in [place]- meds cost $4.00. No home needs identified at this time.    Transition of Care Asessment: Insurance and Status: Insurance coverage has been reviewed Patient has primary care physician: Yes Home environment has been reviewed: reviewed- from home alone in apartment Prior level of function:: independent Prior/Current Home Services: No current home services Social Drivers of Health Review: SDOH reviewed no interventions necessary Readmission risk has been reviewed: Yes Transition of care needs: no transition of care needs at this time

## 2023-07-31 NOTE — Progress Notes (Signed)
 Patient lethargic post cath.  Orientation responses slower than AM assessment. CBG= 74, OJ given to patient, drank approximately 3 oz of 4 oz juice cup, recheck CBG=75.  Spoke to Dr. Floria Hurst who stated he would come to bedside and assess. Also called [place], [place] with Rapid Response.  Patient had episode of vomiting, patient cleaned up and gown changed.   Verbal order received from Dr Alroy Aspen to give 1 amp D50.

## 2023-07-31 NOTE — Significant Event (Addendum)
 Rapid Response Event Note   Reason for Call :  Somnolent  Initial Focused Assessment:  Pt in bed, lethargic. Will open eyes to physical stimulus, but non-sustained. Follows commands, response is delayed. Face symmetrical. No drift in BUE. Plantar flexion equal in BLE. EOMI, pupils 3mm, brisk, midline.  Breathing is regular, unlabored. Clear breath sounds.  Abdomen soft. Pt had episode of emesis on my arrival.   VS: T 97.53F, BP 89/52, HR 78, RR 16, SpO2 97% on room air  Interventions:  -CBG -EKG - IVF bolus  Plan of Care:  [facility] consult- MD at bedside evaluating patient  Event Summary:  MD Notified: Dr. Alroy Aspen & Dr. Gwynneth Lessen Call Time: 1701 Arrival Time: 1705 End Time: 1740  Washington Hacker, RN

## 2023-07-31 NOTE — H&P (View-Only) (Signed)
 Progress Note  Patient Name: Sarah Davis Date of Encounter: 07/31/2023 [facility] Cardiologist: None   Interval Summary    Patient without focal symptoms this morning. She is sleepy appearing but denies dyspnea, chest pain. Expresses some frustration with NPO status.   Vital Signs Vitals:   07/30/23 2041 07/30/23 2359 07/31/23 0507 07/31/23 0805  BP:  105/63 101/61 (!) 90/47  Pulse:  62 70 70  Resp:  15 18 17   Temp:  98.2 F (36.8 C) 98 F (36.7 C) 98.2 F (36.8 C)  TempSrc:  Oral Oral Oral  SpO2:  98% 98% 99%  Weight: 55.1 kg     Height: 5' 5 (1.651 m)       Intake/Output Summary (Last 24 hours) at 07/31/2023 0825 Last data filed at 07/31/2023 0700 Gross per 24 hour  Intake 473.13 ml  Output 300 ml  Net 173.13 ml      07/30/2023    8:41 PM 09/05/2018    6:15 PM 06/19/2017    9:26 PM  Last 3 Weights  Weight (lbs) 121 lb 6.4 oz 125 lb 125 lb 4.8 oz  Weight (kg) 55.067 kg 56.7 kg 56.836 kg      Telemetry/ECG  Sinus rhythm with lead II TWI. Rates 60s-70s - Personally Reviewed  Physical Exam  GEN: No acute distress. Sleepy appearing  Neck: No JVD Cardiac: RRR, no murmurs, rubs, or gallops.  Respiratory: Clear to auscultation bilaterally. GI: Soft, nontender, non-distended  MS: No edema  Assessment & Plan  Patient with history of CAD, vfib arrest, hypertension, tobacco use, chronic hepatitic C, dyslipidemia, schizophrenia/bipolar disorder.  Elevated troponin Hx CAD/STEMI Patient with history of STEMI, stenting of distal LCX, RCA in 2021, complicated by vfib arrest presented to [facility] on 6/9 with altered mental status (apparently multiple ED visits recently for this) and was found to be hypoglycemic/hypothermic. As a part of her workup, troponin checked, found to be 0.37->0.03. BNP elevated to 6670. Patient admitted and treated with ASA and heparin. Cardiology evaluated at [facility] and on echo, patient seen with LV apex hypokinesis, EF 35-40%. Given these  abnormalities, patient transferred to [facility] for further evaluation. Although RWMA on echo may represent stress cardiomyopathy, given her hx will tentatively proceed with LHC today pending review of renal function with Dr. Alroy Aspen. Will repeat BMET as morning check was prior to administration of pre-cath fluids. Continue Heparin and ASA pending LHC.  Acute HFrEF As above, patient admitted initially with hypoglycemia and altered mental status found to have LV apex hypokinesis and LVEF reduced 35-40% at OSH. Per my review of outside records, no previous diagnosis of CHF.  Patient with creatinine 1.5 this morning (baseline closer to 1.2) and soft BP today. Unable to add GDMT at this time. Will need to be cautious with adding beta blocker given recent hypoglycemia.  Hyperlipidemia Latest LDL excellent at 30mg /dL.  Continue Lipitor 80mg  daily.  DM Patient with hypoglycemia at OSH. Apparently has had decreased oral intake but continue home insulin  dose.  Will closely monitor glucose inpatient, no insulin  this morning given NPO status  Hypothyroidism Continue Synthroid 75mcg (reduced at OSH with low TSH).  Schizophrenia/bipolar Continue home medication regimen.    For questions or updates, please contact [facility] Please consult [email or web address] for contact info under       Signed, [name], PA-C    Attending Note:   The patient was seen and examined.  Agree with assessment and plan as noted above.  Changes made  to the above note as needed.  Patient seen and independently examined with [name], Pa .   We discussed all aspects of the encounter. I agree with the assessment and plan as stated above.     CP , + troponins.     She is on for cath today .   Will try to do the cath with minimal constrast  ECG shows TWI across the V leads - possibly due to Takotsubo vs. An LAD infarction.       I have spent a total of 40 minutes with patient reviewing hospital  notes ,  telemetry, EKGs, labs and examining patient as well as establishing an assessment and plan that was discussed with the patient.  > 50% of time was spent in direct patient care.    [place], Marieta Shorten., MD, [facility] 07/31/2023, 12:06 PM [address]  [address] Office [phone] Pager [phone]

## 2023-07-31 NOTE — Progress Notes (Signed)
   07/31/23 1655  Assess: MEWS Score  BP (!) 88/40  MAP (mmHg) (!) 56  Pulse Rate 76  ECG Heart Rate 76  Resp (!) 21  SpO2 95 %  Assess: MEWS Score  MEWS Temp 0  MEWS Systolic 1  MEWS Pulse 0  MEWS RR 1  MEWS LOC 0  MEWS Score 2  MEWS Score Color Yellow  Assess: if the MEWS score is Yellow or Red  Were vital signs accurate and taken at a resting state? Yes  Does the patient meet 2 or more of the SIRS criteria? No  MEWS guidelines implemented  Yes, yellow  Treat  MEWS Interventions Considered administering scheduled or prn medications/treatments as ordered  Take Vital Signs  Increase Vital Sign Frequency  Yellow: Q2hr x1, continue Q4hrs until patient remains green for 12hrs  Escalate  MEWS: Escalate Yellow: Discuss with charge nurse and consider notifying provider and/or RRT  Notify: Charge Nurse/RN  Name of Charge Nurse/RN Notified Tanise Russman RN  Provider Notification  Provider Name/Title DR Alroy Aspen  Date Provider Notified 07/31/23  Method of Notification Call  Notification Reason Change in status  Provider response En route (CAME TO BEDSIDE TO ASSESS PATIENT)  Date of Provider Response 07/31/23  Time of Provider Response 1700  Notify: Rapid Response  Name of Rapid Response RN Notified [facility] RN  Date Rapid Response Notified 07/31/23  Time Rapid Response Notified 1700  Assess: SIRS CRITERIA  SIRS Temperature  0  SIRS Respirations  1  SIRS Pulse 0  SIRS WBC 0  SIRS Score Sum  1

## 2023-07-31 NOTE — Progress Notes (Signed)
 Cardiology Rounding note  Pt was very somnolent prior to her cath  Has remained somnolent .  Not helped with juice  Glucose is 74 BP is adequate   Will ask [facility] to come by and see     Ahmad Alert, MD  07/31/2023 5:13 PM    [facility] [address] [place],  [address] [place], [place]  [address] Phone: [phone]; Fax: [phone]

## 2023-07-31 NOTE — Progress Notes (Addendum)
 Progress Note  Patient Name: Sarah Davis Date of Encounter: 07/31/2023 [facility] Cardiologist: None   Interval Summary    Patient without focal symptoms this morning. She is sleepy appearing but denies dyspnea, chest pain. Expresses some frustration with NPO status.   Vital Signs Vitals:   07/30/23 2041 07/30/23 2359 07/31/23 0507 07/31/23 0805  BP:  105/63 101/61 (!) 90/47  Pulse:  62 70 70  Resp:  15 18 17   Temp:  98.2 F (36.8 C) 98 F (36.7 C) 98.2 F (36.8 C)  TempSrc:  Oral Oral Oral  SpO2:  98% 98% 99%  Weight: 55.1 kg     Height: 5' 5 (1.651 m)       Intake/Output Summary (Last 24 hours) at 07/31/2023 0825 Last data filed at 07/31/2023 0700 Gross per 24 hour  Intake 473.13 ml  Output 300 ml  Net 173.13 ml      07/30/2023    8:41 PM 09/05/2018    6:15 PM 06/19/2017    9:26 PM  Last 3 Weights  Weight (lbs) 121 lb 6.4 oz 125 lb 125 lb 4.8 oz  Weight (kg) 55.067 kg 56.7 kg 56.836 kg      Telemetry/ECG  Sinus rhythm with lead II TWI. Rates 60s-70s - Personally Reviewed  Physical Exam  GEN: No acute distress. Sleepy appearing  Neck: No JVD Cardiac: RRR, no murmurs, rubs, or gallops.  Respiratory: Clear to auscultation bilaterally. GI: Soft, nontender, non-distended  MS: No edema  Assessment & Plan  Patient with history of CAD, vfib arrest, hypertension, tobacco use, chronic hepatitic C, dyslipidemia, schizophrenia/bipolar disorder.  Elevated troponin Hx CAD/STEMI Patient with history of STEMI, stenting of distal LCX, RCA in 2021, complicated by vfib arrest presented to [facility] on 6/9 with altered mental status (apparently multiple ED visits recently for this) and was found to be hypoglycemic/hypothermic. As a part of her workup, troponin checked, found to be 0.37->0.03. BNP elevated to 6670. Patient admitted and treated with ASA and heparin. Cardiology evaluated at [facility] and on echo, patient seen with LV apex hypokinesis, EF 35-40%. Given these  abnormalities, patient transferred to [facility] for further evaluation. Although RWMA on echo may represent stress cardiomyopathy, given her hx will tentatively proceed with LHC today pending review of renal function with Dr. Alroy Aspen. Will repeat BMET as morning check was prior to administration of pre-cath fluids. Continue Heparin and ASA pending LHC.  Acute HFrEF As above, patient admitted initially with hypoglycemia and altered mental status found to have LV apex hypokinesis and LVEF reduced 35-40% at OSH. Per my review of outside records, no previous diagnosis of CHF.  Patient with creatinine 1.5 this morning (baseline closer to 1.2) and soft BP today. Unable to add GDMT at this time. Will need to be cautious with adding beta blocker given recent hypoglycemia.  Hyperlipidemia Latest LDL excellent at 30mg /dL.  Continue Lipitor 80mg  daily.  DM Patient with hypoglycemia at OSH. Apparently has had decreased oral intake but continue home insulin  dose.  Will closely monitor glucose inpatient, no insulin  this morning given NPO status  Hypothyroidism Continue Synthroid 75mcg (reduced at OSH with low TSH).  Schizophrenia/bipolar Continue home medication regimen.    For questions or updates, please contact [facility] Please consult [email or web address] for contact info under       Signed, Sarah Prince, PA-C    Attending Note:   The patient was seen and examined.  Agree with assessment and plan as noted above.  Changes made  to the above note as needed.  Patient seen and independently examined with Sarah Prince, Pa .   We discussed all aspects of the encounter. I agree with the assessment and plan as stated above.     CP , + troponins.     She is on for cath today .   Will try to do the cath with minimal constrast  ECG shows TWI across the V leads - possibly due to Takotsubo vs. An LAD infarction.       I have spent a total of 40 minutes with patient reviewing hospital  notes ,  telemetry, EKGs, labs and examining patient as well as establishing an assessment and plan that was discussed with the patient.  > 50% of time was spent in direct patient care.    [place], Sarah Davis., MD, [facility] 07/31/2023, 12:06 PM [address]  [address] Office [phone] Pager [phone]

## 2023-07-31 NOTE — Consult Note (Signed)
 CONSULT NOTE  Sarah Davis  DOB: May 26, 1960  PCP: Patient, No Pcp Per [facility]:[id number]  DOA: 07/30/2023  LOS: 1 day  Hospital Day: 2  Brief narrative: Farran Amsden is a 63 y.o. female with PMH significant for DM2 on insulin , HTN, HLD, CAD/stent, stroke, CKD, treated hep C, COPD, hypothyroidism marijuana use, past alcohol use, schizophrenia, bipolar disorder.  Patient had previous cardiac care at [facility].  Per report, in 2021, she had STEMI complicated by V-fib arrest, she underwent cardiac cath and needed multiple stents.  Patient is on multiple psych medicines.  Patient was in the ED at [facility] multiple times recently for altered mental status. 6/9, she was brought by EMS to [facility] ED for altered mental status, noted to be hypoglycemic, hypothermic.  Started on D10 drip.  Admitted to [facility].   Over the next 48 hours, troponin and BNP trended up.  She was started on aspirin, heparin drip, seen by cardiology.  Echocardiogram showed LV apex hypokinesis with EF 35 to 40% questionable Takotsubo.  6/11, patient was transferred to [facility] under cardiology service for consideration of cardiac cath.  Notes from this morning mention the patient was somnolent but able to wake up to answer questions.  Earlier this afternoon, patient underwent cardiac cath, did not require any intervention.  Per reports he did not require any sedation for the procedure.  She remained somnolent throughout the procedure and after that as well.  Fingerstick blood glucose was checked multiple times during the day and was in 70s and 80s Blood pressure running mostly in 90s systolic. Given her persistent somnolence, hospitalist service was consulted this afternoon.  At the time of my evaluation, patient was propped up in bed.  Somnolent, barely opens eyes to command, tried to open her mouth.  Fell right back to sleep.  Patient's mother, bedside RN and rapid response nurse were present. Blood sugar in the 70s.  No focal  deficit on exam.   pupil equal and reactive bilaterally. Cardiology service had given a bolus of 250 mL of normal saline and continue normal saline 50 mL/h post cath. I ordered for stat CBC, CMP, lactic acid level, ammonia, urine drug screen  Per patient's mother at bedside, patient used to drink in the past but not lately.  She continues to use marijuana but mother is not aware of any other drug use.  Patient is on multiple psychiatric medications at home but mother is not aware if she continues to take all of them.  The ones listed on the chart are buspirone 15 mg twice daily, gabapentin  300-600 mg nightly, Lamictal  100 mg nightly, Invega 3 mg nightly, trazodone 40 mg nightly. Here in the hospital, patient received only trazodone 100 mg last night.  Assessment and plan: Acute metabolic encephalopathy H/o schizophrenia, bipolar disorder Patient is somnolent, able to open eyes gently on touch but falls right back asleep.  Pupil equally reactive bilaterally.  No focal deficit noted.  Per report, somnolence has been persistent since this morning. Unlikely to be a cerebrovascular event. Received trazodone 100 mg last night but all other psych meds as listed above were held. Obtain CBC, CMP, lactic acid level, ammonia, drug screen Continue to monitor mental status change.  If worsens to a level of airway compromise, may need imaging and ICU consult.  Acute systolic CHF  Hypotension Echocardiogram at [facility] showed EF of 35 to 40% questionable Takotsubo.  Cardiac cath nonsignificant today.  Blood pressure running low in 80s and 90s.  Given  250 mL bolus earlier.  Will give 1 more 250 mL normal saline.  No signs of pedal edema, respiratory distress. Continue to monitor hemodynamics  Elevated troponin H/o CAD/stent HLD At [facility], troponin was elevated, echo showed WMA and hence underwent cardiac cath today.  No new lesion to intervene today. Continue aspirin, statin  Hypoglycemia Type 2  diabetes mellitus A1c pending PTA meds-long-acting and short acting insulin  Blood sugar level running low in 70s and 80s today due to poor intake. Tonight, I would run D5 NS at 30 mL/h and check fingersticks every 4 hours No results found for: HGBA1C Recent Labs  Lab 07/30/23 1714 07/31/23 0812 07/31/23 1222 07/31/23 1629 07/31/23 1712  GLUCAP 121* 90 86 74 75   CKD 3B Baseline creatinine less than 1.8.  Remains in range.  Continue to monitor Recent Labs    07/31/23 0445 07/31/23 0933  BUN 17 17  CREATININE 1.51* 1.62*  CO2 23 22   H/o hep C -treated H/o alcohol use Mother denies any prior history of liver cirrhosis.  Obtain CMP and ammonia level  marijuana use Counseled to quit  COPD Respiratory status stable.  As needed bronchodilators  Hypothyroidism Synthroid  Mobility: Encourage ambulation when more awake.  Per mother, sees independent at baseline.  Goals of care   Code Status: Full Code     DVT prophylaxis:  SCD's Start: 07/31/23 1526   Antimicrobials: None Fluid: D5 NS at 30 mL/h overnight Family Communication: Mother at bedside  Level of care:  Telemetry Cardiac   Patient is from: Home Needs to continue in-hospital care: Has acute metabolic encephalopathy, hypotension Anticipated d/c to: Pending clinical course    Diet:  Diet Order             Diet Heart Room service appropriate? Yes; Fluid consistency: Thin  Diet effective now                   Scheduled Meds:  [START ON 08/01/2023] aspirin EC  81 mg Oral Daily   atorvastatin   80 mg Oral Daily   levothyroxine  75 mcg Oral Q0600   sodium chloride  flush  3 mL Intravenous Q12H   traZODone  100 mg Oral QHS    PRN meds: sodium chloride , acetaminophen , hydrALAZINE, labetalol, nitroGLYCERIN, ondansetron (ZOFRAN) IV, sodium chloride  flush   Infusions:   sodium chloride      dextrose 5 % and 0.9 % NaCl     sodium chloride       Antimicrobials: Anti-infectives (From admission,  onward)    None       Objective: Vitals:   07/31/23 1715 07/31/23 1718  BP: (!) 101/52 (!) 89/52  Pulse:  78  Resp: (!) 9 16  Temp:  (!) 97.5 F (36.4 C)  SpO2:  97%    Intake/Output Summary (Last 24 hours) at 07/31/2023 1808 Last data filed at 07/31/2023 1300 Gross per 24 hour  Intake 473.13 ml  Output 750 ml  Net -276.87 ml   Filed Weights   07/30/23 2041  Weight: 55.1 kg   Weight change:  Body mass index is 20.2 kg/m.   Physical Exam: General exam: Middle-aged Caucasian female. Skin: No rashes, lesions or ulcers. HEENT: Atraumatic, normocephalic, no obvious bleeding Lungs: Clear to auscultation bilaterally,  CVS: S1, S2, no murmur,   GI/Abd: Soft, nontender, nondistended, bowel sound present,   CNS: Somnolent, barely opens eyes on touch, tries to open mouth but falls right back to sleep.  Slow but able to move  all extremities Psychiatry: Sad affect Extremities: No pedal edema, no calf tenderness,   Data Review: I have personally reviewed the laboratory data and studies available.  F/u labs  Unresulted Labs (From admission, onward)     Start     Ordered   08/01/23 0500  Lipid panel  Tomorrow morning,   R       Question:  Specimen collection method  Answer:  Lab=Lab collect   07/31/23 0833   08/01/23 0500  Hemoglobin A1c  Tomorrow morning,   R       Question:  Specimen collection method  Answer:  Lab=Lab collect   07/31/23 0836   08/01/23 0500  Basic metabolic panel with GFR  Tomorrow morning,   R       Question:  Specimen collection method  Answer:  Lab=Lab collect   07/31/23 1727   07/31/23 1727  Rapid urine drug screen (hospital performed)  Once,   R        07/31/23 1726   07/31/23 1727  CBC  ONCE - STAT,   STAT       Question:  Specimen collection method  Answer:  Lab=Lab collect   07/31/23 1727   07/31/23 1727  Comprehensive metabolic panel with GFR  ONCE - STAT,   STAT       Question:  Specimen collection method  Answer:  Lab=Lab collect    07/31/23 1727   07/31/23 1726  Ammonia  ONCE - STAT,   STAT       Question:  Specimen collection method  Answer:  Lab=Lab collect   07/31/23 1725   07/31/23 1726  Lactic acid, plasma  (Lactic Acid)  ONCE - STAT,   STAT       Question:  Specimen collection method  Answer:  Lab=Lab collect   07/31/23 1725   07/31/23 0500  Lipoprotein A (LPA)  Tomorrow morning,   R        07/30/23 1953   07/31/23 0500  CBC  Daily,   R      07/30/23 2246            Signed, Hoyt Macleod, MD [facility] 07/31/2023

## 2023-08-01 ENCOUNTER — Encounter (HOSPITAL_COMMUNITY): Payer: Self-pay | Admitting: Cardiology

## 2023-08-01 ENCOUNTER — Inpatient Hospital Stay (HOSPITAL_COMMUNITY): Payer: MEDICAID

## 2023-08-01 DIAGNOSIS — I5020 Unspecified systolic (congestive) heart failure: Secondary | ICD-10-CM

## 2023-08-01 DIAGNOSIS — G9340 Encephalopathy, unspecified: Secondary | ICD-10-CM | POA: Diagnosis not present

## 2023-08-01 DIAGNOSIS — I214 Non-ST elevation (NSTEMI) myocardial infarction: Secondary | ICD-10-CM | POA: Diagnosis not present

## 2023-08-01 DIAGNOSIS — F17210 Nicotine dependence, cigarettes, uncomplicated: Secondary | ICD-10-CM | POA: Diagnosis not present

## 2023-08-01 DIAGNOSIS — R569 Unspecified convulsions: Secondary | ICD-10-CM

## 2023-08-01 DIAGNOSIS — E7220 Disorder of urea cycle metabolism, unspecified: Secondary | ICD-10-CM | POA: Diagnosis not present

## 2023-08-01 DIAGNOSIS — J9600 Acute respiratory failure, unspecified whether with hypoxia or hypercapnia: Secondary | ICD-10-CM | POA: Diagnosis not present

## 2023-08-01 HISTORY — DX: Unspecified systolic (congestive) heart failure: I50.20

## 2023-08-01 LAB — POCT I-STAT 7, (LYTES, BLD GAS, ICA,H+H)
Acid-base deficit: 8 mmol/L — ABNORMAL HIGH (ref 0.0–2.0)
Bicarbonate: 18.1 mmol/L — ABNORMAL LOW (ref 20.0–28.0)
Calcium, Ion: 1.1 mmol/L — ABNORMAL LOW (ref 1.15–1.40)
HCT: 31 % — ABNORMAL LOW (ref 36.0–46.0)
Hemoglobin: 10.5 g/dL — ABNORMAL LOW (ref 12.0–15.0)
O2 Saturation: 100 %
Potassium: 4.6 mmol/L (ref 3.5–5.1)
Sodium: 141 mmol/L (ref 135–145)
TCO2: 19 mmol/L — ABNORMAL LOW (ref 22–32)
pCO2 arterial: 40.5 mmHg (ref 32–48)
pH, Arterial: 7.26 — ABNORMAL LOW (ref 7.35–7.45)
pO2, Arterial: 462 mmHg — ABNORMAL HIGH (ref 83–108)

## 2023-08-01 LAB — COMPREHENSIVE METABOLIC PANEL WITH GFR
ALT: 15 U/L (ref 0–44)
AST: 29 U/L (ref 15–41)
Albumin: 2.5 g/dL — ABNORMAL LOW (ref 3.5–5.0)
Alkaline Phosphatase: 79 U/L (ref 38–126)
Anion gap: 19 — ABNORMAL HIGH (ref 5–15)
BUN: 34 mg/dL — ABNORMAL HIGH (ref 8–23)
CO2: 15 mmol/L — ABNORMAL LOW (ref 22–32)
Calcium: 7.7 mg/dL — ABNORMAL LOW (ref 8.9–10.3)
Chloride: 109 mmol/L (ref 98–111)
Creatinine, Ser: 1.93 mg/dL — ABNORMAL HIGH (ref 0.44–1.00)
GFR, Estimated: 29 mL/min — ABNORMAL LOW (ref >60)
Glucose, Bld: 171 mg/dL — ABNORMAL HIGH (ref 70–99)
Potassium: 4.9 mmol/L (ref 3.5–5.1)
Sodium: 143 mmol/L (ref 135–145)
Total Bilirubin: 0.6 mg/dL (ref 0.0–1.2)
Total Protein: 4.9 g/dL — ABNORMAL LOW (ref 6.5–8.1)

## 2023-08-01 LAB — URINALYSIS, ROUTINE W REFLEX MICROSCOPIC
Bilirubin Urine: NEGATIVE
Glucose, UA: NEGATIVE mg/dL
Hgb urine dipstick: NEGATIVE
Ketones, ur: 5 mg/dL — AB
Leukocytes,Ua: NEGATIVE
Nitrite: NEGATIVE
Protein, ur: NEGATIVE mg/dL
Specific Gravity, Urine: 1.026 (ref 1.005–1.030)
pH: 6 (ref 5.0–8.0)

## 2023-08-01 LAB — ECHOCARDIOGRAM COMPLETE
AR max vel: 1.39 cm2
AV Area VTI: 1.37 cm2
AV Area mean vel: 1.36 cm2
AV Mean grad: 7.7 mmHg
AV Peak grad: 14 mmHg
Ao pk vel: 1.87 m/s
Area-P 1/2: 4.89 cm2
Calc EF: 50.3 %
Height: 65 in
S' Lateral: 3.2 cm
Single Plane A2C EF: 47.3 %
Single Plane A4C EF: 51.9 %
Weight: 2038.81 [oz_av]

## 2023-08-01 LAB — CBC
HCT: 38.2 % (ref 36.0–46.0)
Hemoglobin: 11.5 g/dL — ABNORMAL LOW (ref 12.0–15.0)
MCH: 26.7 pg (ref 26.0–34.0)
MCHC: 30.1 g/dL (ref 30.0–36.0)
MCV: 88.8 fL (ref 80.0–100.0)
Platelets: 209 10*3/uL (ref 150–400)
RBC: 4.3 MIL/uL (ref 3.87–5.11)
RDW: 18.2 % — ABNORMAL HIGH (ref 11.5–15.5)
WBC: 9.9 10*3/uL (ref 4.0–10.5)
nRBC: 0 % (ref 0.0–0.2)

## 2023-08-01 LAB — BLOOD GAS, ARTERIAL
Acid-base deficit: 6.7 mmol/L — ABNORMAL HIGH (ref 0.0–2.0)
Bicarbonate: 14.9 mmol/L — ABNORMAL LOW (ref 20.0–28.0)
Drawn by: 42783
O2 Saturation: 98 %
Patient temperature: 37
pCO2 arterial: 21 mmHg — ABNORMAL LOW (ref 32–48)
pH, Arterial: 7.46 — ABNORMAL HIGH (ref 7.35–7.45)
pO2, Arterial: 105 mmHg (ref 83–108)

## 2023-08-01 LAB — BASIC METABOLIC PANEL WITH GFR
Anion gap: 20 — ABNORMAL HIGH (ref 5–15)
BUN: 30 mg/dL — ABNORMAL HIGH (ref 8–23)
CO2: 13 mmol/L — ABNORMAL LOW (ref 22–32)
Calcium: 8.4 mg/dL — ABNORMAL LOW (ref 8.9–10.3)
Chloride: 112 mmol/L — ABNORMAL HIGH (ref 98–111)
Creatinine, Ser: 1.88 mg/dL — ABNORMAL HIGH (ref 0.44–1.00)
GFR, Estimated: 30 mL/min — ABNORMAL LOW (ref >60)
Glucose, Bld: 88 mg/dL (ref 70–99)
Potassium: 5.4 mmol/L — ABNORMAL HIGH (ref 3.5–5.1)
Sodium: 145 mmol/L (ref 135–145)

## 2023-08-01 LAB — LIPID PANEL
Cholesterol: 125 mg/dL (ref 0–200)
HDL: 31 mg/dL — ABNORMAL LOW (ref >40)
LDL Cholesterol: 47 mg/dL (ref 0–99)
Total CHOL/HDL Ratio: 4 ratio
Triglycerides: 237 mg/dL — ABNORMAL HIGH (ref <150)
VLDL: 47 mg/dL — ABNORMAL HIGH (ref 0–40)

## 2023-08-01 LAB — GLUCOSE, CAPILLARY
Glucose-Capillary: 105 mg/dL — ABNORMAL HIGH (ref 70–99)
Glucose-Capillary: 107 mg/dL — ABNORMAL HIGH (ref 70–99)
Glucose-Capillary: 125 mg/dL — ABNORMAL HIGH (ref 70–99)
Glucose-Capillary: 139 mg/dL — ABNORMAL HIGH (ref 70–99)
Glucose-Capillary: 144 mg/dL — ABNORMAL HIGH (ref 70–99)
Glucose-Capillary: 152 mg/dL — ABNORMAL HIGH (ref 70–99)
Glucose-Capillary: 159 mg/dL — ABNORMAL HIGH (ref 70–99)
Glucose-Capillary: 206 mg/dL — ABNORMAL HIGH (ref 70–99)
Glucose-Capillary: 99 mg/dL (ref 70–99)

## 2023-08-01 LAB — MRSA NEXT GEN BY PCR, NASAL: MRSA by PCR Next Gen: NOT DETECTED

## 2023-08-01 LAB — HEMOGLOBIN A1C
Hgb A1c MFr Bld: 4.8 % (ref 4.8–5.6)
Mean Plasma Glucose: 91.06 mg/dL

## 2023-08-01 LAB — MAGNESIUM: Magnesium: 2.1 mg/dL (ref 1.7–2.4)

## 2023-08-01 LAB — TSH: TSH: 0.717 u[IU]/mL (ref 0.350–4.500)

## 2023-08-01 LAB — LACTIC ACID, PLASMA: Lactic Acid, Venous: 3.4 mmol/L (ref 0.5–1.9)

## 2023-08-01 LAB — AMMONIA: Ammonia: 162 umol/L — ABNORMAL HIGH (ref 9–35)

## 2023-08-01 MED ORDER — NOREPINEPHRINE 4 MG/250ML-% IV SOLN
0.0000 ug/min | INTRAVENOUS | Status: DC
  Administered 2023-08-01: 10 ug/min via INTRAVENOUS
  Administered 2023-08-01: 5 ug/min via INTRAVENOUS
  Administered 2023-08-02: 2 ug/min via INTRAVENOUS
  Filled 2023-08-01 (×4): qty 250

## 2023-08-01 MED ORDER — DOCUSATE SODIUM 50 MG/5ML PO LIQD
100.0000 mg | Freq: Two times a day (BID) | ORAL | Status: DC
  Administered 2023-08-01 – 2023-08-10 (×8): 100 mg
  Filled 2023-08-01 (×11): qty 10

## 2023-08-01 MED ORDER — ORAL CARE MOUTH RINSE
15.0000 mL | OROMUCOSAL | Status: DC
  Administered 2023-08-01 – 2023-08-04 (×39): 15 mL via OROMUCOSAL

## 2023-08-01 MED ORDER — SODIUM CHLORIDE 0.9 % IV SOLN: INTRAVENOUS | Status: DC

## 2023-08-01 MED ORDER — TRAZODONE HCL 50 MG PO TABS: 100.0000 mg | ORAL_TABLET | Freq: Every day | ORAL | Status: DC

## 2023-08-01 MED ORDER — LACTULOSE 10 GM/15ML PO SOLN
30.0000 g | Freq: Three times a day (TID) | ORAL | Status: DC
  Administered 2023-08-01 – 2023-08-03 (×9): 30 g
  Filled 2023-08-01 (×9): qty 45

## 2023-08-01 MED ORDER — RIFAXIMIN 550 MG PO TABS
550.0000 mg | ORAL_TABLET | Freq: Two times a day (BID) | ORAL | Status: DC
  Administered 2023-08-01 – 2023-08-08 (×15): 550 mg
  Filled 2023-08-01 (×15): qty 1

## 2023-08-01 MED ORDER — LEVOTHYROXINE SODIUM 100 MCG PO TABS
100.0000 ug | ORAL_TABLET | Freq: Every morning | ORAL | Status: DC
  Administered 2023-08-02 – 2023-08-11 (×10): 100 ug
  Filled 2023-08-01 (×10): qty 1

## 2023-08-01 MED ORDER — ASPIRIN 81 MG PO CHEW
81.0000 mg | CHEWABLE_TABLET | Freq: Every day | ORAL | Status: DC
  Administered 2023-08-01 – 2023-08-11 (×11): 81 mg
  Filled 2023-08-01 (×11): qty 1

## 2023-08-01 MED ORDER — HEPARIN SODIUM (PORCINE) 5000 UNIT/ML IJ SOLN
5000.0000 [IU] | Freq: Three times a day (TID) | INTRAMUSCULAR | Status: DC
  Administered 2023-08-01 – 2023-08-06 (×15): 5000 [IU] via SUBCUTANEOUS
  Filled 2023-08-01 (×15): qty 1

## 2023-08-01 MED ORDER — PROPOFOL 1000 MG/100ML IV EMUL
0.0000 ug/kg/min | INTRAVENOUS | Status: DC
  Administered 2023-08-01 (×2): 5 ug/kg/min via INTRAVENOUS
  Administered 2023-08-02: 30 ug/kg/min via INTRAVENOUS
  Administered 2023-08-02: 20 ug/kg/min via INTRAVENOUS
  Filled 2023-08-01 (×3): qty 100

## 2023-08-01 MED ORDER — IOHEXOL 350 MG/ML SOLN
75.0000 mL | Freq: Once | INTRAVENOUS | Status: AC | PRN
  Administered 2023-08-01: 75 mL via INTRAVENOUS

## 2023-08-01 MED ORDER — PROPOFOL 1000 MG/100ML IV EMUL
INTRAVENOUS | Status: AC
  Filled 2023-08-01: qty 100

## 2023-08-01 MED ORDER — SODIUM CHLORIDE 0.9 % IV BOLUS
500.0000 mL | Freq: Once | INTRAVENOUS | Status: AC
  Administered 2023-08-01: 500 mL via INTRAVENOUS

## 2023-08-01 MED ORDER — ORAL CARE MOUTH RINSE
15.0000 mL | OROMUCOSAL | Status: DC | PRN
Start: 2023-08-01 — End: 2023-09-01

## 2023-08-01 MED ORDER — ETOMIDATE 2 MG/ML IV SOLN
INTRAVENOUS | Status: AC
  Filled 2023-08-01: qty 20

## 2023-08-01 MED ORDER — LEVOTHYROXINE SODIUM 25 MCG PO TABS: 75.0000 ug | ORAL_TABLET | Freq: Every day | ORAL | Status: DC

## 2023-08-01 MED ORDER — SODIUM BICARBONATE 8.4 % IV SOLN
INTRAVENOUS | Status: DC
  Filled 2023-08-01 (×4): qty 1000

## 2023-08-01 MED ORDER — ATORVASTATIN CALCIUM 40 MG PO TABS
80.0000 mg | ORAL_TABLET | Freq: Every day | ORAL | Status: DC
  Administered 2023-08-01 – 2023-08-10 (×10): 80 mg
  Filled 2023-08-01 (×10): qty 2

## 2023-08-01 MED ORDER — NALOXONE HCL 0.4 MG/ML IJ SOLN
INTRAMUSCULAR | Status: AC
  Administered 2023-08-01: 0.4 mg
  Filled 2023-08-01: qty 1

## 2023-08-01 MED ORDER — INSULIN ASPART 100 UNIT/ML IJ SOLN
1.0000 [IU] | INTRAMUSCULAR | Status: DC
  Administered 2023-08-01: 3 [IU] via SUBCUTANEOUS
  Administered 2023-08-01: 1 [IU] via SUBCUTANEOUS
  Administered 2023-08-02: 2 [IU] via SUBCUTANEOUS
  Administered 2023-08-02 – 2023-08-04 (×5): 1 [IU] via SUBCUTANEOUS
  Administered 2023-08-04: 3 [IU] via SUBCUTANEOUS
  Administered 2023-08-04: 2 [IU] via SUBCUTANEOUS

## 2023-08-01 MED ORDER — SODIUM CHLORIDE 0.9 % IV SOLN
2.0000 g | INTRAVENOUS | Status: AC
  Administered 2023-08-01 – 2023-08-05 (×5): 2 g via INTRAVENOUS
  Filled 2023-08-01 (×5): qty 20

## 2023-08-01 MED ORDER — POLYETHYLENE GLYCOL 3350 17 G PO PACK
17.0000 g | PACK | Freq: Every day | ORAL | Status: DC
  Administered 2023-08-01 – 2023-08-08 (×4): 17 g
  Filled 2023-08-01 (×7): qty 1

## 2023-08-01 MED ORDER — MIDAZOLAM HCL 2 MG/2ML IJ SOLN
INTRAMUSCULAR | Status: AC
  Administered 2023-08-01: 2 mg
  Filled 2023-08-01: qty 2

## 2023-08-01 MED ORDER — SODIUM CHLORIDE 0.9 % IV SOLN
250.0000 mL | INTRAVENOUS | Status: AC
  Administered 2023-08-01: 250 mL via INTRAVENOUS

## 2023-08-01 MED ORDER — ROCURONIUM BROMIDE 10 MG/ML (PF) SYRINGE
PREFILLED_SYRINGE | INTRAVENOUS | Status: AC
  Administered 2023-08-01: 50 mg
  Filled 2023-08-01: qty 10

## 2023-08-01 MED ORDER — FAMOTIDINE 20 MG PO TABS
20.0000 mg | ORAL_TABLET | Freq: Every day | ORAL | Status: DC
  Administered 2023-08-01 – 2023-08-08 (×8): 20 mg
  Filled 2023-08-01 (×8): qty 1

## 2023-08-01 MED ORDER — FENTANYL CITRATE PF 50 MCG/ML IJ SOSY
PREFILLED_SYRINGE | INTRAMUSCULAR | Status: AC
  Administered 2023-08-01: 100 ug
  Filled 2023-08-01: qty 2

## 2023-08-01 NOTE — Consult Note (Addendum)
 NEUROLOGY CONSULT NOTE   Date of service: August 01, 2023 Patient Name: Sarah Davis MRN:  [id number] DOB:  [birth year] Chief Complaint: Sarah Davis Requesting Provider: Alveta Aleene PARAS, MD  History of Present Illness  Sarah Davis is a 63 y.o. female with a PMHx of bipolar disorder, CKD3. COPD, CAD s/p stenting, hypothyroidism, neuropathy, polysubstance abuse, tobacco abuse, thyroid disease and tobacco abuse who has recently presented to [facility] multiple times for AMS.  She was brought in by EMS on 6/9 and had been noted by them to be hypoglycemic - she was treated with D10. In the emergency room she was hypothermic. Troponin was initially elevated and then trended down to 0.03. BNP was 6670. Echocardiogram was done which demonstrated the LV apex to be hypokinetic. EF was 35 to 40%. She was transferred to [facility] for assessment of possible NSTEMI vs Takotsubo's cardiomyopathy.   Relative at the bedside states that the patient has had recurrent episodes of AMS and also has a history of possible liver problems from remote heavy drinking. She states that she overheard some nurses talking about an elevated ammonia level, but is not aware of any prior history of such. She states that the patient's most recent bout of acute cognitive decline started about one week ago.   Per Cardiology team, the patient was conversant and able to provide consent for a procedure at 0900 yesterday morning. Yesterday evening, she was confused with evidence for cognitive slowing. Code Stroke was called this morning when RN noted the patient to be poorly responsive. Narcan  given with no improvement.   LKW: 0900 yesterday morning Modified rankin score: 1-No significant post stroke disability and can perform usual duties with stroke symptoms IV Thrombolysis:  No: Out of the time window EVT:  No: Out of the time window   NIHSS components Score: Comment  1a Level of Conscious 0[]  1[]  2[]  3[x]      1b LOC Questions 0[]  1[]  2[x]        1c LOC Commands 0[]  1[]  2[x]       2 Best Gaze 0[x]  1[]  2[]      Eyes conjugate at the midline with intact oculocephalic reflex  3 Visual 0[]  1[]  2[]  3[x]     No blink to threat  4 Facial Palsy 0[x]  1[]  2[]  3[]     Flaccidly symmetric  5a Motor Arm - left 0[]  1[]  2[]  3[]  4[x]  UN[]    5b Motor Arm - Right 0[]  1[]  2[]  3[]  4[x]  UN[]    6a Motor Leg - Left 0[]  1[]  2[]  3[]  4[x]  UN[]    6b Motor Leg - Right 0[]  1[]  2[]  3[]  4[x]  UN[]    7 Limb Ataxia 0[x]  1[]  2[]  UN[]      8 Sensory 0[]  1[]  2[x]  UN[]      9 Best Language 0[]  1[]  2[]  3[x]      10 Dysarthria 0[]  1[]  2[x]  UN[]      11 Extinct. and Inattention 0[x]  1[]  2[]      Unresponsive with no lateralized neglect  TOTAL:   33      ROS  Unable to ascertain due to unresponsiveness.   Past History   Past Medical History:  Diagnosis Date   Bipolar 2 disorder (HCC)    CKD (chronic kidney disease), stage III (HCC)    COPD (chronic obstructive pulmonary disease) (HCC)    Coronary artery disease    Hypothyroid    Neuropathy    Stroke (HCC)    Tobacco abuse      Past Surgical History:  Procedure Laterality Date  CHOLECYSTECTOMY     LEFT HEART CATH AND CORONARY ANGIOGRAPHY N/A 07/31/2023   Procedure: LEFT HEART CATH AND CORONARY ANGIOGRAPHY;  Surgeon: Elmira Newman PARAS, MD;  Location: [facility] INVASIVE CV LAB;  Service: Cardiovascular;  Laterality: N/A;    Family History: Family History  Problem Relation Age of Onset   Diabetes Father     Social History  reports that she has been smoking. She started smoking about 29 years ago. She has a 14.7 pack-year smoking history. She has never used smokeless tobacco. She reports that she does not drink alcohol and does not use drugs.  Allergies  Allergen Reactions   Lithium Anaphylaxis   Tramadol Nausea Only   Codeine Rash   Sulfa Antibiotics Rash    Medications   Current Facility-Administered Medications:    0.9 %  sodium chloride  infusion, 250 mL, Intravenous, PRN, Patwardhan, Manish J, MD    0.9 %  sodium chloride  infusion, 250 mL, Intravenous, Continuous, Nahser, Aleene PARAS, MD   acetaminophen  (TYLENOL ) tablet 650 mg, 650 mg, Oral, Q4H PRN, Meng, Hao, PA   aspirin  EC tablet 81 mg, 81 mg, Oral, Daily, Pabellones, Hao, PA   atorvastatin  (LIPITOR) tablet 80 mg, 80 mg, Oral, Daily, [place], Hao, [place], 80 mg at 07/31/23 1022   dextrose  5 %-0.9 % sodium chloride  infusion, , Intravenous, Continuous, Dahal, Binaya, MD, Last Rate: 30 mL/hr at 08/01/23 0350, Infusion Verify at 08/01/23 0350   docusate (COLACE) 50 MG/5ML liquid 100 mg, 100 mg, Per Tube, BID, Agarwala, Ravi, MD   etomidate  (AMIDATE ) 2 MG/ML injection, , , ,    lactulose  (CHRONULAC ) 10 GM/15ML solution 30 g, 30 g, Oral, TID, Dahal, Binaya, MD, 30 g at 07/31/23 2226   levothyroxine  (SYNTHROID ) tablet 75 mcg, 75 mcg, Oral, Q0600, Meng, Hao, PA, 75 mcg at 07/31/23 0548   nitroGLYCERIN  (NITROSTAT ) SL tablet 0.4 mg, 0.4 mg, Sublingual, Q5 Min x 3 PRN, Meng, Hao, PA   norepinephrine  (LEVOPHED ) 4mg  in (0.016 mg/mL) premix infusion, 0-10 mcg/min, Intravenous, Titrated, Nahser, Aleene PARAS, MD, Last Rate: 56.3 mL/hr at 08/01/23 0850, 15 mcg/min at 08/01/23 0850   ondansetron  (ZOFRAN ) injection 4 mg, 4 mg, Intravenous, Q6H PRN, Meng, Hao, PA   polyethylene glycol (MIRALAX  / GLYCOLAX ) packet 17 g, 17 g, Per Tube, Daily, Agarwala, Ravi, MD   propofol  (DIPRIVAN ) 1000 MG/100ML infusion, 0-80 mcg/kg/min, Intravenous, Continuous, Agarwala, Ravi, MD, Last Rate: 1.65 mL/hr at 08/01/23 0924, 5 mcg/kg/min at 08/01/23 0924   propofol  (DIPRIVAN ) 1000 MG/100ML infusion, , , ,    sodium chloride  0.9 % bolus 250 mL, 250 mL, Intravenous, Once, Dahal, Binaya, MD   sodium chloride  flush (NS) 0.9 % injection 3 mL, 3 mL, Intravenous, Q12H, Patwardhan, Manish J, MD, 3 mL at 07/31/23 2241   sodium chloride  flush (NS) 0.9 % injection 3 mL, 3 mL, Intravenous, PRN, Patwardhan, Manish J, MD   traZODone  (DESYREL ) tablet 100 mg, 100 mg, Oral, QHS, Floretta Mallard, MD, 100 mg  at 07/30/23 2337  Vitals   Vitals:   08/01/23 0820 08/01/23 0825 08/01/23 0830 08/01/23 0923  BP: (!) 82/56 (!) 93/54 (!) 74/46   Pulse: (!) 103 (!) 101 97   Resp: (!) 36 (!) 22 20   Temp:    97.8 F (36.6 C)  TempSrc:    Oral  SpO2: 97% 97% 98%   Weight:      Height:        Body mass index is 20.2 kg/m.   Physical Exam   Constitutional:  Ill appearing with normal body habitus. Sonorous respirations.   Eyes: No scleral injection. Anicteric HENT: Neck is supple.  Head: Normocephalic.  Respiratory: Sonorous respirations in the setting of AMS.    Neurologic Examination   As per HPI.   Labs/Imaging/Neurodiagnostic studies   CBC:  Recent Labs  Lab 08-13-2023 1746 08/01/23 0516  WBC 6.9 9.9  HGB 11.0* 11.5*  HCT 35.9* 38.2  MCV 86.5 88.8  PLT 161 209   Basic Metabolic Panel:  Lab Results  Component Value Date   NA 145 08/01/2023   K 5.4 (H) 08/01/2023   CO2 13 (L) 08/01/2023   GLUCOSE 88 08/01/2023   BUN 30 (H) 08/01/2023   CREATININE 1.88 (H) 08/01/2023   CALCIUM  8.4 (L) 08/01/2023   GFRNONAA 30 (L) 08/01/2023   GFRAA 36 (L) 09/05/2018   Lipid Panel:  Lab Results  Component Value Date   LDLCALC 47 08/01/2023   HgbA1c:  Lab Results  Component Value Date   HGBA1C 4.8 08/01/2023     APTT  Lab Results  Component Value Date   APTT 132 (H) 07/30/2023     ASSESSMENT  63 y.o. female with a PMHx of bipolar disorder, CKD3. COPD, CAD s/p stenting, hypothyroidism, neuropathy, polysubstance abuse, tobacco abuse, thyroid disease and tobacco abuse who has recently presented to [facility] multiple times for AMS. She initially presented to [facility] and then was transferred to [facility] for assessment of possible NSTEMI vs Takotsubo's cardiomyopathy. In the context of the patient having had recurrent episodes of AMS, her most recent bout of acute cognitive decline started about one week ago. Per Cardiology team, the patient was conversant and able to  provide consent for a procedure at 0900 yesterday morning. Yesterday evening, she was confused with evidence for cognitive slowing. Code Stroke was called this morning when RN noted the patient to be poorly responsive. Narcan  given with no improvement. She was intubated for airway protection and then brought to CT.  - Exam reveals a stuporous patient with NIHSS of 33 - CT head: No evidence of an acute intracranial abnormality. Small chronic cortically-based infarcts again demonstrated within the posterior left frontal lobe and left parietal lobe. Background mild cerebral white matter chronic small vessel ischemic disease. - CTA of head and neck: No LVO per discussion with Radiology. Official report is pending.  - CBG 152 - Labs show multiple metabolic derangements, including a lactate of 2.1 and an ammonia of 252 yesterday evening. - Impression: Severe encephalopathy, most likely secondary to severe hyperammonemia. Overall pattern of her deficits, history of symptoms and imaging findings, does not militate in favor of stroke.   RECOMMENDATIONS  - Management of her hyperammonemia per CCM - MRI brain WITHOUT contrast to assess for possible cerebral edema secondary to her hyperammonemia - Frequent neuro checks.   I personally spent a total of 45 minutes in the care of the patient today including performing a medically appropriate exam/evaluation, documenting clinical information in the EHR, and independently interpreting results.   Addendum: - EEG reveals continuous generalized rhythmic 2-3hz  slowing. The findings are suggestive of severe diffuse encephalopathy. No seizures or epileptiform discharges were seen throughout the recording. - Continue management of her hyperammonemia, to include daily ammonia levels to assess for efficacy of treatment - It should be noted that it generally takes longer for ammonia levels within the CNS compartment to normalize relative to serum levels, due to the blood  brain barrier.  ______________________________________________________________________    Bonney SHARK, Linard Daft, MD [facility]

## 2023-08-01 NOTE — Progress Notes (Signed)
   08/01/23 0900  Spiritual Encounters  Type of Visit Initial  Care provided to: [facility] partners present during encounter Physician  Reason for visit Urgent spiritual support  OnCall Visit No  Interventions  Spiritual Care Interventions Made Established relationship of care and support;Compassionate presence;Reflective listening;Supported grief process  Intervention Outcomes  Outcomes Connection to spiritual care;Awareness around self/spiritual resourses;Patient family open to resources    Chaplain responded to paged request for emotional support to pt's daughter in the midst of a code stroke. Chaplain provided compassionate presence, reflective listening, coffee, and escorted daughter to [facility] waiting area. Chaplain will follow up.

## 2023-08-01 NOTE — Plan of Care (Signed)
  Problem: Activity: Goal: Risk for activity intolerance will decrease Outcome: Not Progressing   Problem: Nutrition: Goal: Adequate nutrition will be maintained Outcome: Not Progressing   

## 2023-08-01 NOTE — Procedures (Signed)
 Intubation Procedure Note  Sarah Davis  [id number]  January 17, 1961  Date:08/01/23  Time:12:39 PM   Provider Performing:Rebecah Dangerfield    Procedure: Intubation (31500)  Indication(s) Respiratory Failure  Consent Unable to obtain consent due to emergent nature of procedure.   Anesthesia Etomidate, Versed, Fentanyl, and Rocuronium   Time Out Verified patient identification, verified procedure, site/side was marked, verified correct patient position, special equipment/implants available, medications/allergies/relevant history reviewed, required imaging and test results available.   Sterile Technique Usual hand hygeine, masks, and gloves were used   Procedure Description Patient positioned in bed supine.  Sedation given as noted above.  Patient was intubated with endotracheal tube using scope.  View was Grade 1 full glottis .  Number of attempts was 1.  Colorimetric CO2 detector was consistent with tracheal placement.   Complications/Tolerance None; patient tolerated the procedure well. Chest X-ray is ordered to verify placement.   EBL Minimal   Specimen(s) None    Sarah Davis Sarah Davis ACNP Acute Care Nurse Practitioner Sarah Davis Pulmonary/Critical Care Please consult Amion 08/01/2023, 12:40 PM

## 2023-08-01 NOTE — Progress Notes (Signed)
 Routine EEG completed, results pending Neurology review and interpretation

## 2023-08-01 NOTE — Progress Notes (Addendum)
 Received patient in bed only arousable to sternal rub. Patient has been in this mental status per night shift. EKG changes noted with wide QRS. Notified cardiologist.  Davida Espy, RN

## 2023-08-01 NOTE — Progress Notes (Signed)
 Patient transported back and forth from CT scan without complications

## 2023-08-01 NOTE — Significant Event (Addendum)
 Rapid Response Event Note   Reason for Call :  Altered mental status/Code Stroke  Initial Focused Assessment:  Patient obtunded.  Her gaze is to the right when her eyes are held open.  No response to deep painful stimuli. Lung sounds clear, heart tones regular. She is pale and cool. She is guppy breathing  BP 74/46  HR 97  RR 20  O2 sat 98% on RA  Dr Alroy Aspen at bedside speaking with family in the room.  Interventions:  Levophed started at 5mcg per MD order, titrated by 5mcg to 15mcg per orders NS bolus initiated  Neurology (Dr Renaee Caro) at bedside CCM consulted 0.4 mg Narcan IV, no change in patient status, she did blink a little.  Siegfried Dress Minor and Dr Marce Sensing at bedside Patient intubated.  RSI medications given 1amp Bicarb given IV  Transported to 2M03 with RT bagging patient.  Placed on vent upon arrival to ICU Started Proprofol  Transported to CT for Stat (code stroke) head CT and CTA head and neck. (Dr Renaee Caro at bedside while in CT) IV team place PIV  Plan of Care:     Event Summary:   MD Notified: Dr Alroy Aspen at bedside Call Time: 0826 Arrival Time: 0830 End Time: 1020  Waldemar Guillaume, RN

## 2023-08-01 NOTE — Consult Note (Addendum)
 NAME:  Sarah Davis, MRN:  [id number], DOB:  January 25, 1961, LOS: 2 ADMISSION DATE:  07/30/2023, CONSULTATION DATE: 08/01/2023 REFERRING MD: Cardiology, CHIEF COMPLAINT: Acute respiratory distress  History of Present Illness:  63 year old female who underwent cardiac catheterization 07/31/2023 without acute interventions required.  She does have a history of polysubstance abuse, tobacco abuse, bipolar, thyroid disease and at the time of pulmonary critical care arriving at her bedside she was in acute respiratory failure and required intubation emergently.  She transferred to the intensive care unit.  She is currently on Levophed drip.  Chest x-ray, ABG, CT of the head have been ordered at this time.  Pertinent  Medical History   Past Medical History:  Diagnosis Date   Bipolar 2 disorder (HCC)    CKD (chronic kidney disease), stage III (HCC)    COPD (chronic obstructive pulmonary disease) (HCC)    Coronary artery disease    Hypothyroid    Neuropathy    Stroke (HCC)    Tobacco abuse      Significant Hospital Events: Including procedures, antibiotic start and stop dates in addition to other pertinent events     Interim History / Subjective:  08/01/2023 called to the bedside by rapid response team for for patient who was unresponsive and unable to protect airway intubated urgently at bedside.  Objective    Blood pressure (!) 74/46, pulse 97, temperature 97.7 F (36.5 C), temperature source Axillary, resp. rate 20, height 5' 5 (1.651 m), weight 55.1 kg, SpO2 98%.        Intake/Output Summary (Last 24 hours) at 08/01/2023 4010 Last data filed at 08/01/2023 0757 Gross per 24 hour  Intake 223.75 ml  Output 200 ml  Net 23.75 ml   Filed Weights   07/30/23 2041  Weight: 55.1 kg    Examination: General: 63 year old female acute respiratory responsive unresponsive HENT: Endotracheal tube placed after sedation equal bilateral breath sounds heard Lungs: End-tidal CO2  changes Cardiovascular: Heart sounds are regular Abdomen: Soft positive bowel sounds Extremities: Warm and dry Neuro: Unresponsive to any stimulus  Resolved problem list   Assessment and Plan  Acute respiratory failure requiring bedside intubation urgently and transferred to the intensive care unit. Adjust ventilator based on arterial blood gases Follow-up chest x-ray Bronchodilators as needed  Concern for neurological event Neurology consult appreciated CT of the head in process Continue to monitor in intensive care unit  Non-ST elevation MI s/p cardiac catheterization on 07/31/2023 which shows occlusion with good supplemental blood supply not requiring intervention at this time Shock Levophed to maintain systolic blood pressure greater than 90 Cardiology is already involved  History of bipolar polysubstance abuse Monitor for seizure activity Thiamine and folic acid  HX thyroid issues Check tsh  Best Practice (right click and Reselect all SmartList Selections daily)   Diet/type: NPO DVT prophylaxis prophylactic heparin   Pressure ulcer(s): N/A GI prophylaxis: PPI Lines: N/A Foley:  N/A Code Status:  full code Last date of multidisciplinary goals of care discussion [tbd]  Labs   CBC: Recent Labs  Lab 07/31/23 0445 07/31/23 1746 08/01/23 0516  WBC 9.0 6.9 9.9  HGB 10.2* 11.0* 11.5*  HCT 34.0* 35.9* 38.2  MCV 88.1 86.5 88.8  PLT 175 161 209    Basic Metabolic Panel: Recent Labs  Lab 07/31/23 0445 07/31/23 0933 07/31/23 1746 08/01/23 0516  NA 141 144 140 145  K 4.1 4.4 4.8 5.4*  CL 110 111 107 112*  CO2 23 22 19* 13*  GLUCOSE 83 86 217*  88  BUN 17 17 23  30*  CREATININE 1.51* 1.62* 1.67* 1.88*  CALCIUM  8.1* 8.6* 8.2* 8.4*   GFR: Estimated Creatinine Clearance: 26.6 mL/min (A) (by C-G formula based on SCr of 1.88 mg/dL (H)). Recent Labs  Lab 07/31/23 0445 07/31/23 1746 08/01/23 0516  WBC 9.0 6.9 9.9  LATICACIDVEN  --  2.1*  --     Liver  Function Tests: Recent Labs  Lab 07/31/23 1746  AST 21  ALT 11  ALKPHOS 75  BILITOT 0.5  PROT 4.9*  ALBUMIN 2.4*   No results for input(s): LIPASE, AMYLASE in the last 168 hours. Recent Labs  Lab 07/31/23 1746  AMMONIA 252*    ABG    Component Value Date/Time   PHART 7.46 (H) 08/01/2023 0850   PCO2ART 21 (L) 08/01/2023 0850   PO2ART 105 08/01/2023 0850   HCO3 14.9 (L) 08/01/2023 0850   ACIDBASEDEF 6.7 (H) 08/01/2023 0850   O2SAT 98 08/01/2023 0850     Coagulation Profile: No results for input(s): INR, PROTIME in the last 168 hours.  Cardiac Enzymes: No results for input(s): CKTOTAL, CKMB, CKMBINDEX, TROPONINI in the last 168 hours.  HbA1C: Hgb A1c MFr Bld  Date/Time Value Ref Range Status  08/01/2023 05:16 AM 4.8 4.8 - 5.6 % Final    Comment:    (NOTE) Diagnosis of Diabetes The following HbA1c ranges recommended by the American Diabetes Association (ADA) may be used as an aid in the diagnosis of diabetes mellitus.  Hemoglobin             Suggested A1C NGSP%              Diagnosis  <5.7                   Non Diabetic  5.7-6.4                Pre-Diabetic  >6.4                   Diabetic  <7.0                   Glycemic control for                       adults with diabetes.      CBG: Recent Labs  Lab 07/31/23 1930 08/01/23 0035 08/01/23 0328 08/01/23 0736 08/01/23 0841  GLUCAP 122* 107* 99 105* 125*    Review of Systems:   na  Past Medical History:  She,  has a past medical history of Bipolar 2 disorder (HCC), CKD (chronic kidney disease), stage III (HCC), COPD (chronic obstructive pulmonary disease) (HCC), Coronary artery disease, Hypothyroid, Neuropathy, Stroke (HCC), and Tobacco abuse.   Surgical History:   Past Surgical History:  Procedure Laterality Date   CHOLECYSTECTOMY     LEFT HEART CATH AND CORONARY ANGIOGRAPHY N/A 07/31/2023   Procedure: LEFT HEART CATH AND CORONARY ANGIOGRAPHY;  Surgeon: Cody Das,  MD;  Location: [facility] INVASIVE CV LAB;  Service: Cardiovascular;  Laterality: N/A;     Social History:   reports that she has been smoking. She started smoking about 29 years ago. She has a 14.7 pack-year smoking history. She has never used smokeless tobacco. She reports that she does not drink alcohol and does not use drugs.   Family History:  Her family history includes Diabetes in her father.   Allergies Allergies  Allergen Reactions   Lithium Anaphylaxis   Tramadol  Nausea Only   Codeine Rash   Sulfa Antibiotics Rash     Home Medications  Prior to Admission medications   Medication Sig Start Date End Date Taking? Authorizing Provider  aspirin  EC 81 MG tablet Take 81 mg by mouth in the morning. 06/24/14  Yes [provider]  atorvastatin  (LIPITOR) 80 MG tablet Take 80 mg by mouth at bedtime.   Yes [provider]  busPIRone (BUSPAR) 15 MG tablet Take 15 mg by mouth 2 (two) times daily.   Yes [provider]  furosemide (LASIX) 20 MG tablet Take 20 mg by mouth once a week. On Mondays 07/16/23  Yes [provider]  gabapentin  (NEURONTIN ) 300 MG capsule Take 300-600 mg by mouth at bedtime.    Yes [provider]  lamoTRIgine  (LAMICTAL ) 100 MG tablet Take 100 mg by mouth at bedtime. 05/14/17  Yes [provider]  LANTUS SOLOSTAR 100 UNIT/ML Solostar Pen Inject 30 Units into the skin daily with lunch.   Yes [provider]  levothyroxine  (SYNTHROID ) 100 MCG tablet Take 100 mcg by mouth every morning.   Yes [provider]  nitroGLYCERIN  (NITROSTAT ) 0.4 MG SL tablet Place 0.4 mg under the tongue every 5 (five) minutes as needed for chest pain. 02/06/21  Yes [provider]  paliperidone (INVEGA) 3 MG 24 hr tablet Take 3 mg by mouth at bedtime.   Yes [provider]  traZODone  (DESYREL ) 100 MG tablet Take 400 mg by mouth at bedtime.   Yes [provider]  Vitamin D, Ergocalciferol, (DRISDOL) 1.25 MG  (50000 UNIT) CAPS capsule Take 50,000 Units by mouth once a week. On Mondays   Yes [provider]     Critical care time: 45 min    Siegfried Dress Kamyra Schroeck ACNP Acute Care Nurse Practitioner [facility] Pulmonary/Critical Care Please consult Amion 08/01/2023, 9:23 AM

## 2023-08-01 NOTE — Progress Notes (Signed)
 Chaplain followed up with pt family in [facility] waiting room. Indicated no spiritual/emotional needs at this time, but Chaplain assured them we are here whenever needed. Family expressed their gratitude.

## 2023-08-01 NOTE — Progress Notes (Signed)
*  PRELIMINARY RESULTS* Echocardiogram 2D Echocardiogram has been performed.  Glenna Lango 08/01/2023, 12:07 PM

## 2023-08-01 NOTE — Plan of Care (Signed)
  Problem: Clinical Measurements: Goal: Ability to maintain clinical measurements within normal limits will improve Outcome: Progressing Goal: Will remain free from infection Outcome: Progressing Goal: Diagnostic test results will improve Outcome: Progressing Goal: Respiratory complications will improve Outcome: Progressing   Problem: Skin Integrity: Goal: Risk for impaired skin integrity will decrease Outcome: Progressing

## 2023-08-01 NOTE — Progress Notes (Signed)
 Progress Note  Patient Name: Sarah Davis Date of Encounter: 08/01/2023 [facility] Cardiologist: None   Interval Summary    Pt is unresponsive this am.  Has a right preferential gaze.   She was lethargic last night when   Vital Signs Vitals:   08/01/23 0200 08/01/23 0300 08/01/23 0326 08/01/23 0738  BP: (!) 99/50 (!) 100/50 (!) 108/54 105/66  Pulse: 80 92 93 (!) 101  Resp: (!) 21 (!) 22 19 (!) 23  Temp:   (!) 97.5 F (36.4 C) 97.7 F (36.5 C)  TempSrc:   Axillary Axillary  SpO2: 95% 98% 97% 99%  Weight:      Height:        Intake/Output Summary (Last 24 hours) at 08/01/2023 0833 Last data filed at 08/01/2023 0757 Gross per 24 hour  Intake 223.75 ml  Output 650 ml  Net -426.25 ml      07/30/2023    8:41 PM 09/05/2018    6:15 PM 06/19/2017    9:26 PM  Last 3 Weights  Weight (lbs) 121 lb 6.4 oz 125 lb 125 lb 4.8 oz  Weight (kg) 55.067 kg 56.7 kg 56.836 kg      Telemetry/ECG  Sinus rhythm with lead II TWI. Rates 60s-70s - Personally Reviewed  Physical Exam  GEN: No acute distress. Sleepy appearing  Neck: No JVD Cardiac: RRR, no murmurs, rubs, or gallops.  Respiratory: Clear to auscultation bilaterally. GI: Soft, nontender, non-distended  MS: No edema  Assessment & Plan  Patient with history of CAD, vfib arrest, hypertension, tobacco use, chronic hepatitic C, dyslipidemia, schizophrenia/bipolar disorder.  Elevated troponin Hx CAD/STEMI Patient with history of STEMI, stenting of distal LCX, RCA in 2021, complicated by vfib arrest presented to [facility] on 6/9 with altered mental status (apparently multiple ED visits recently for this) and was found to be hypoglycemic/hypothermic. As a part of her workup, troponin checked, found to be 0.37->0.03. BNP elevated to 6670. Patient admitted and treated with ASA and heparin . Cardiology evaluated at [facility] and on echo, patient seen with LV apex hypokinesis, EF 35-40%. Given these abnormalities, patient transferred to  [facility] for further evaluation. Although RWMA on echo may represent stress cardiomyopathy, given her hx will tentatively proceed with LHC today pending review of renal function with Dr. Alroy Aspen. Will repeat BMET as morning check was prior to administration of pre-cath fluids. Continue Heparin  and ASA pending LHC.  Acute HFrEF As above, patient admitted initially with hypoglycemia and altered mental status found to have LV apex hypokinesis and LVEF reduced 35-40% at OSH. Per my review of outside records, no previous diagnosis of CHF.  Patient with creatinine 1.5 this morning (baseline closer to 1.2) and soft BP today. Unable to add GDMT at this time. Will need to be cautious with adding beta blocker given recent hypoglycemia.  Hyperlipidemia Latest LDL excellent at 30mg /dL.  Continue Lipitor 80mg  daily.  DM Patient with hypoglycemia at OSH. Apparently has had decreased oral intake but continue home insulin  dose.  Will closely monitor glucose inpatient, no insulin  this morning given NPO status  Hypothyroidism Continue Synthroid  75mcg (reduced at OSH with low TSH).  Schizophrenia/bipolar Continue home medication regimen.    For questions or updates, please contact [facility] Please consult [email or web address] for contact info under       Signed, Ahmad Alert, MD    Attending Note:   The patient was seen and examined.  Agree with assessment and plan as noted above.  Changes made to the  above note as needed.  Patient seen and independently examined with [name], Pa .   We discussed all aspects of the encounter. I agree with the assessment and plan as stated above.     CP , + troponins.      She was found to have an old occlusion of her mid LAD .    Prox RCA stent is widely patent   Dr. Filiberto Hug recommended medical therapy  I have reviewed the angiograms.    2.  Encephalopathy:   her mental status declined overnight .  Associated with hypotension, wide QRS.  No focal  abnormalities.   Rapid response was called ,  she was intubated.   Levophed was started   Her ammonia level was 252 last night .  Was given lactulose  last night  Additional doses to be given today   Head CT shows no acute stroke She has had chronic small strokes  Critical care to take over care .  2.  CHF :  echo today shows mildly reduced LVEF of 45-50%.  , grade I DD  Trivial MR  Mild AS      I have spent a total of 40 minutes with patient reviewing hospital  notes , telemetry, EKGs, labs and examining patient as well as establishing an assessment and plan that was discussed with the patient.  > 50% of time was spent in direct patient care.    [place], Marieta Shorten., MD, [facility] 08/01/2023, 8:33 AM [address],  [address] Office [phone] Pager [phone]

## 2023-08-01 NOTE — Procedures (Signed)
 Patient Name: Sarah Davis  MRN: [id number]  Epilepsy Attending: [name]  Referring Physician/Provider: Arlina Lair, MD  Date: 08/01/2023 Duration: 30.57 mins  Patient history: 63yo F with right preferential gaze. EEG to evaluate for seizure.  Level of alertness: comatose/ lethargic   AEDs during EEG study: Propofol, Versed  Technical aspects: This EEG study was done with scalp electrodes positioned according to the 10-20 International system of electrode placement. Electrical activity was reviewed with band pass filter of 1-70Hz , sensitivity of 7 uV/mm, display speed of 62mm/sec with a 60Hz  notched filter applied as appropriate. EEG data were recorded continuously and digitally stored.  Video monitoring was available and reviewed as appropriate.  Description: EEG showed continuous generalized rhythmic 2-3hz  delta slowing. Hyperventilation and photic stimulation were not performed.     EEG was technically difficult due to significant myogenic artifact.   ABNORMALITY - Continuous slow, generalized  IMPRESSION: This technically difficult study is suggestive of severe diffuse encephalopathy. No seizures or epileptiform discharges were seen throughout the recording.  Admir Candelas O Kaelynne Christley

## 2023-08-01 NOTE — Progress Notes (Signed)
 NURSING PROGRESS NOTE  Maisyn Nouri [id number] Transfer Data: 08/01/2023 12:50 PM Attending Provider: Arlina Lair, MD WUJ:[id number], No Pcp Per Code Status: FULL  Novaleigh Kohlman is a 63 y.o. female patient transferred from 6E as a code stroke and admitted to [facility] room 3.    Cardiac Monitoring: Box # 41M-03 in place. Cardiac monitor yields:normal sinus rhythm.  Last Documented Vital Signs: Blood pressure (!) 100/55, pulse 93, temperature 97.6 F (36.4 C), temperature source Oral, resp. rate (!) 25, height 5' 5 (1.651 m), weight 57.8 kg, SpO2 97%.  All patients belongings were given to the family, and upper dentures were shown and given to the daughter to take home.

## 2023-08-01 NOTE — Code Documentation (Signed)
 Stroke Response Nurse Documentation Code Documentation  Sarah Davis is a 63 y.o. female admitted to [facility]  on 07/30/2023 for non acute STEMI from [facility] for cardiac cath completed 6/12. Past medical hx of bipolar 2 disorder, stroke, CAD, STEMI status post stent 2021, CKD stage III, hypothyroidism, COPD, neuropathy, and tobacco abuse. Noted previous visits to ED for altered mental status in chart. Code stroke was activated by unit.   Patient on 6E unit where she was LKW was yesterday but unclear exact time. Was lethargic during rounds and interactive but not speaking last night. This morning noted to be unresponsive.   Stroke team at the bedside after patient activation. RRT titrating pressor for low BP. NIHSS 35, see documentation for details and code stroke times. Patient with decreased LOC, disoriented, not following commands, bilateral hemianopia, bilateral arm weakness, bilateral leg weakness, bilateral decreased sensation, Global aphasia , dysarthria , and Sensory  neglect on exam. Narcan given. Patient intubated for airway protection. ICU orders placed for transfer. Patient to CT with team. The following imaging was completed:  CT Head and CTA. Patient is not a candidate for IV Thrombolytic due to out of window. Patient is not a candidate for IR due to no LVO.   Care/Plan: admit to ICU for frequent neuro checks.   Process Delays Noted: airway protection- intubation required  Bedside handoff with RN.    Felicitas Horse Stroke Response RN

## 2023-08-02 ENCOUNTER — Inpatient Hospital Stay (HOSPITAL_COMMUNITY): Payer: MEDICAID

## 2023-08-02 DIAGNOSIS — G9341 Metabolic encephalopathy: Secondary | ICD-10-CM | POA: Diagnosis not present

## 2023-08-02 DIAGNOSIS — R7989 Other specified abnormal findings of blood chemistry: Secondary | ICD-10-CM | POA: Insufficient documentation

## 2023-08-02 DIAGNOSIS — I952 Hypotension due to drugs: Secondary | ICD-10-CM | POA: Insufficient documentation

## 2023-08-02 DIAGNOSIS — I214 Non-ST elevation (NSTEMI) myocardial infarction: Secondary | ICD-10-CM | POA: Diagnosis not present

## 2023-08-02 DIAGNOSIS — E7220 Disorder of urea cycle metabolism, unspecified: Secondary | ICD-10-CM

## 2023-08-02 DIAGNOSIS — N1832 Chronic kidney disease, stage 3b: Secondary | ICD-10-CM | POA: Diagnosis not present

## 2023-08-02 DIAGNOSIS — I5021 Acute systolic (congestive) heart failure: Secondary | ICD-10-CM | POA: Diagnosis not present

## 2023-08-02 DIAGNOSIS — N1831 Chronic kidney disease, stage 3a: Secondary | ICD-10-CM

## 2023-08-02 DIAGNOSIS — I5033 Acute on chronic diastolic (congestive) heart failure: Secondary | ICD-10-CM | POA: Insufficient documentation

## 2023-08-02 LAB — BASIC METABOLIC PANEL WITH GFR
Anion gap: 19 — ABNORMAL HIGH (ref 5–15)
BUN: 35 mg/dL — ABNORMAL HIGH (ref 8–23)
CO2: 16 mmol/L — ABNORMAL LOW (ref 22–32)
Calcium: 7.4 mg/dL — ABNORMAL LOW (ref 8.9–10.3)
Chloride: 110 mmol/L (ref 98–111)
Creatinine, Ser: 1.82 mg/dL — ABNORMAL HIGH (ref 0.44–1.00)
GFR, Estimated: 31 mL/min — ABNORMAL LOW (ref >60)
Glucose, Bld: 94 mg/dL (ref 70–99)
Potassium: 3 mmol/L — ABNORMAL LOW (ref 3.5–5.1)
Sodium: 145 mmol/L (ref 135–145)

## 2023-08-02 LAB — CBC
HCT: 34.6 % — ABNORMAL LOW (ref 36.0–46.0)
Hemoglobin: 10.7 g/dL — ABNORMAL LOW (ref 12.0–15.0)
MCH: 26.8 pg (ref 26.0–34.0)
MCHC: 30.9 g/dL (ref 30.0–36.0)
MCV: 86.7 fL (ref 80.0–100.0)
Platelets: 232 10*3/uL (ref 150–400)
RBC: 3.99 MIL/uL (ref 3.87–5.11)
RDW: 18.5 % — ABNORMAL HIGH (ref 11.5–15.5)
WBC: 15.5 10*3/uL — ABNORMAL HIGH (ref 4.0–10.5)
nRBC: 0 % (ref 0.0–0.2)

## 2023-08-02 LAB — GLUCOSE, CAPILLARY
Glucose-Capillary: 90 mg/dL (ref 70–99)
Glucose-Capillary: 126 mg/dL — ABNORMAL HIGH (ref 70–99)
Glucose-Capillary: 116 mg/dL — ABNORMAL HIGH (ref 70–99)
Glucose-Capillary: 126 mg/dL — ABNORMAL HIGH (ref 70–99)
Glucose-Capillary: 161 mg/dL — ABNORMAL HIGH (ref 70–99)
Glucose-Capillary: 90 mg/dL (ref 70–99)

## 2023-08-02 LAB — AMMONIA: Ammonia: 49 umol/L — ABNORMAL HIGH (ref 9–35)

## 2023-08-02 LAB — LACTIC ACID, PLASMA: Lactic Acid, Venous: 1.7 mmol/L (ref 0.5–1.9)

## 2023-08-02 LAB — TRIGLYCERIDES: Triglycerides: 150 mg/dL — ABNORMAL HIGH (ref <150)

## 2023-08-02 MED ORDER — CHLORHEXIDINE GLUCONATE CLOTH 2 % EX PADS
6.0000 | MEDICATED_PAD | Freq: Every day | CUTANEOUS | Status: DC
  Administered 2023-08-02 – 2023-09-01 (×29): 6 via TOPICAL

## 2023-08-02 MED ORDER — ALBUTEROL SULFATE (2.5 MG/3ML) 0.083% IN NEBU: 2.5000 mg | INHALATION_SOLUTION | RESPIRATORY_TRACT | Status: DC | PRN

## 2023-08-02 MED ORDER — POTASSIUM CHLORIDE 20 MEQ PO PACK
40.0000 meq | PACK | ORAL | Status: AC
  Administered 2023-08-02 (×2): 40 meq
  Filled 2023-08-02 (×2): qty 2

## 2023-08-02 NOTE — Assessment & Plan Note (Signed)
 No evidence of cirrhosis on imaging or by LFT's. Not on medications susceptible to cause elevated ammonia.  Copious stool output with lactulose .  - Continue lactulose  and rifaximin and trend ammonia levels.  - Continue empiric ceftriaxone cryptic infection causing increased ammonia.

## 2023-08-02 NOTE — Assessment & Plan Note (Signed)
 Suspect this is due to sedation. Sepsis likely. Clinically euvolemic on exam.  - Titrate NE to keep MAP> 65

## 2023-08-02 NOTE — Assessment & Plan Note (Addendum)
 Presumed related to hyperammonemia, although cause of latter unclear. Remains on low dose propofol. Minimally responsive on examination to pain, no response to voice. EEG shows no seizures.   - Obtain MRI today (without contrast due to AKI)  - Stop sedation after and allow time to wake up.  - SBT and extubate if possible.

## 2023-08-02 NOTE — Progress Notes (Signed)
 Pt was transported to MRI and back without complications.

## 2023-08-02 NOTE — Progress Notes (Signed)
 CCM Progress Note  Patient Details Name: Sarah Davis MRN: [id number] DOB: Jan 09, 1961  LOS: 3  No overnight events, remains intubated and sedated.   6/11 Admitted in transfer from [place] for altered mental status and elevated troponin and possible stress cardiomyopathy (EF 35%) Prior history of CAD with STEMI in 2021, HTN, HLD,and Chronic HCV -treated. 6/12 LHC shows stable CAD 6/13 intubated for obtundation. Ammonia 252    Assessment and Plan Cardiovascular and Mediastinum Hypotension due to medication Assessment & Plan Suspect this is due to sedation. Sepsis likely. Clinically euvolemic on exam.  - Titrate NE to keep MAP> 65  * NSTEMI (non-ST elevated myocardial infarction) (HCC) Overview Chronic LADm occlusion. RCAp stent patient on cath 6/13  Assessment & Plan Likely demand related based on absence of new lesions.   - Continue secondary prevention: ASA and atorvastatin .   Nervous and Auditory Acute metabolic encephalopathy Assessment & Plan Presumed related to hyperammonemia, although cause of latter unclear. Remains on low dose propofol. Minimally responsive on examination to pain, no response to voice. EEG shows no seizures.   - Obtain MRI today (without contrast due to AKI)  - Stop sedation after and allow time to wake up.  - SBT and extubate if possible.     Genitourinary Chronic kidney disease (CKD) stage G3b/A2, moderately decreased glomerular filtration rate (GFR) between 30-44 mL/min/1.73 square meter and albuminuria creatinine ratio between 30-299 mg/g (HCC) Assessment & Plan Creatinine has remained stable.   Other Increased ammonia level Assessment & Plan No evidence of cirrhosis on imaging or by LFT's. Not on medications susceptible to cause elevated ammonia.  Copious stool output with lactulose .  - Continue lactulose  and rifaximin and trend ammonia levels.  - Continue empiric ceftriaxone cryptic infection causing increased ammonia.    Best  Practice (right click and Reselect all SmartList Selections daily)   Diet/type: tubefeeds DVT prophylaxis: heparin  injection 5,000 Units Start: 08/01/23 1400 SCD's Start: 07/31/23 1526   Pressure ulcers present: N/A GI prophylaxis: PPI Lines: N/A Foley:  N/A Code Status:  full code Last date of multidisciplinary goals of care discussion [family updated yesterday]  CRITICAL CARE Performed by: Arlina Lair   Total critical care time: 45 minutes  Critical care time was exclusive of separately billable procedures and treating other patients.  Critical care was necessary to treat or prevent imminent or life-threatening deterioration.  Critical care was time spent personally by me on the following activities: development of treatment plan with patient and/or surrogate as well as nursing, discussions with consultants, evaluation of patient's response to treatment, examination of patient, obtaining history from patient or surrogate, ordering and performing treatments and interventions, ordering and review of laboratory studies, ordering and review of radiographic studies, pulse oximetry, re-evaluation of patient's condition and participation in multidisciplinary rounds.  Arlina Lair, MD [facility] ICU Physician [facility]  Pager: [phone] Mobile: [phone] After hours: [phone].

## 2023-08-02 NOTE — Hospital Course (Signed)
 6/11 Admitted in transfer from [place] for altered mental status and elevated troponin and possible stress cardiomyopathy (EF 35%) Prior history of CAD with STEMI in 2021, HTN, HLD,and Chronic HCV -treated. 6/12 LHC shows stable CAD 6/13 intubated for obtundation. Ammonia 252

## 2023-08-02 NOTE — Progress Notes (Signed)
 [facility] ADULT ICU REPLACEMENT PROTOCOL   The patient does apply for the [facility] Adult ICU Electrolyte Replacment Protocol based on the criteria listed below:   1.Exclusion criteria: TCTS, ECMO, Dialysis, and Myasthenia Gravis patients 2. Is GFR >/= 30 ml/min? Yes.    Patient's GFR today is 31 3. Is SCr </= 2? Yes.   Patient's SCr is 1.82 mg/dL 4. Did SCr increase >/= 0.5 in 24 hours? No. 5.Pt's weight >40kg  Yes.   6. Abnormal electrolyte(s): K+ = 3.0  7. Electrolytes replaced per protocol 8.  Call MD STAT for K+ </= 2.5, Phos </= 1, or Mag </= 1 Physician:  Iran Manna, eMD  Alison Applebaum Britlyn Martine 08/02/2023 5:25 AM

## 2023-08-02 NOTE — Progress Notes (Signed)
 Rounding Note   Patient Name: Sarah Davis Date of Encounter: 08/02/2023  [facility] Cardiologist: None   Subjective Creatinine stable at 1.82.  Lactate has normalized.  On levo at 2 mcg/min.  Remains intubated and sedated. On FiO2 40%, PEEP 5  Scheduled Meds:  aspirin   81 mg Per Tube Daily   atorvastatin   80 mg Per Tube Daily   Chlorhexidine Gluconate Cloth  6 each Topical Daily   docusate  100 mg Per Tube BID   famotidine  20 mg Per Tube Daily   heparin  injection (subcutaneous)  5,000 Units Subcutaneous Q8H   insulin  aspart  1-3 Units Subcutaneous Q4H   lactulose   30 g Per Tube TID   levothyroxine   100 mcg Per Tube q morning   mouth rinse  15 mL Mouth Rinse Q2H   polyethylene glycol  17 g Per Tube Daily   potassium chloride   40 mEq Per Tube Q4H   rifaximin  550 mg Per Tube BID   sodium chloride  flush  3 mL Intravenous Q12H   Continuous Infusions:  sodium chloride  10 mL/hr at 08/01/23 1500   sodium chloride  75 mL/hr at 08/02/23 0700   cefTRIAXone (ROCEPHIN)  IV Stopped (08/01/23 1709)   norepinephrine (LEVOPHED) Adult infusion 4 mcg/min (08/02/23 0700)   propofol (DIPRIVAN) infusion 20 mcg/kg/min (08/02/23 0728)   sodium bicarbonate 150 mEq in dextrose  5 % 1,150 mL infusion 50 mL/hr at 08/02/23 0829   sodium chloride      PRN Meds: acetaminophen , nitroGLYCERIN , ondansetron  (ZOFRAN ) IV, mouth rinse, sodium chloride  flush   Vital Signs  Vitals:   08/02/23 0645 08/02/23 0700 08/02/23 0713 08/02/23 0753  BP: 110/63 123/82  114/63  Pulse: 63 66    Resp: (!) 21 20    Temp:   97.6 F (36.4 C)   TempSrc:   Axillary   SpO2: 100% 100%    Weight:      Height:        Intake/Output Summary (Last 24 hours) at 08/02/2023 0924 Last data filed at 08/02/2023 0923 Gross per 24 hour  Intake 4459.99 ml  Output 950 ml  Net 3509.99 ml      08/01/2023    9:23 AM 07/30/2023    8:41 PM 09/05/2018    6:15 PM  Last 3 Weights  Weight (lbs) 127 lb 6.8 oz 121 lb 6.4 oz 125 lb   Weight (kg) 57.8 kg 55.067 kg 56.7 kg      Telemetry NSR - Personally Reviewed  ECG  No new ECG - Personally Reviewed  Physical Exam  GEN: Intubated, sedated Neck: No JVD Cardiac: RRR, no murmurs, rubs, or gallops.  Respiratory: Mechanical breath sounds GI: Soft MS: No edema Neuro:  Sedated, not following commands Psych: Unable  to assess  Labs High Sensitivity Troponin:   Recent Labs  Lab 07/30/23 2017  TROPONINIHS 1,235*     Chemistry Recent Labs  Lab 07/31/23 1746 08/01/23 0516 08/01/23 1017 08/01/23 1039 08/02/23 0227  NA 140 145 141 143 145  K 4.8 5.4* 4.6 4.9 3.0*  CL 107 112*  --  109 110  CO2 19* 13*  --  15* 16*  GLUCOSE 217* 88  --  171* 94  BUN 23 30*  --  34* 35*  CREATININE 1.67* 1.88*  --  1.93* 1.82*  CALCIUM  8.2* 8.4*  --  7.7* 7.4*  MG  --   --   --  2.1  --   PROT 4.9*  --   --  4.9*  --   ALBUMIN 2.4*  --   --  2.5*  --   AST 21  --   --  29  --   ALT 11  --   --  15  --   ALKPHOS 75  --   --  79  --   BILITOT 0.5  --   --  0.6  --   GFRNONAA 34* 30*  --  29* 31*  ANIONGAP 14 20*  --  19* 19*    Lipids  Recent Labs  Lab 08/01/23 0516 08/02/23 0227  CHOL 125  --   TRIG 237* 150*  HDL 31*  --   LDLCALC 47  --   CHOLHDL 4.0  --     Hematology Recent Labs  Lab 07/31/23 1746 08/01/23 0516 08/01/23 1017 08/02/23 0227  WBC 6.9 9.9  --  15.5*  RBC 4.15 4.30  --  3.99  HGB 11.0* 11.5* 10.5* 10.7*  HCT 35.9* 38.2 31.0* 34.6*  MCV 86.5 88.8  --  86.7  MCH 26.5 26.7  --  26.8  MCHC 30.6 30.1  --  30.9  RDW 17.5* 18.2*  --  18.5*  PLT 161 209  --  232   Thyroid  Recent Labs  Lab 08/01/23 1039  TSH 0.717    BNPNo results for input(s): BNP, PROBNP in the last 168 hours.  DDimer No results for input(s): DDIMER in the last 168 hours.   Radiology  EEG adult Result Date: 08/01/2023 [name], MD     08/01/2023  7:25 PM Patient Name: Joee Iovine MRN: [id number] Epilepsy Attending: [name] Referring  Physician/Provider: Arlina Lair, MD Date: 08/01/2023 Duration: 30.57 mins Patient history: 63yo F with right preferential gaze. EEG to evaluate for seizure. Level of alertness: comatose/ lethargic AEDs during EEG study: Propofol, Versed Technical aspects: This EEG study was done with scalp electrodes positioned according to the 10-20 International system of electrode placement. Electrical activity was reviewed with band pass filter of 1-70Hz , sensitivity of 7 uV/mm, display speed of 51mm/sec with a 60Hz  notched filter applied as appropriate. EEG data were recorded continuously and digitally stored.  Video monitoring was available and reviewed as appropriate. Description: EEG showed continuous generalized rhythmic 2-3hz  delta slowing. Hyperventilation and photic stimulation were not performed.   EEG was technically difficult due to significant myogenic artifact. ABNORMALITY - Continuous slow, generalized IMPRESSION: This technically difficult study is suggestive of severe diffuse encephalopathy. No seizures or epileptiform discharges were seen throughout the recording. [name]   ECHOCARDIOGRAM COMPLETE Result Date: 08/01/2023    ECHOCARDIOGRAM REPORT   Patient Name:   LAZETTE ESTALA  Date of Exam: 08/01/2023 Medical Rec #:  [id number]  Height:       65.0 in Accession #:    [id number] Weight:       127.4 lb Date of Birth:  04-15-1959  BSA:          1.633 m Patient Age:    63 years   BP:           103/58 mmHg Patient Gender: F          HR:           100 bpm. Exam Location:  Inpatient Procedure: 2D Echo, Color Doppler and Cardiac Doppler (Both Spectral and Color            Flow Doppler were utilized during procedure). Indications:    NSTEMI  History:  Patient has no prior history of Echocardiogram examinations.  Sonographer:    Jeralene Mom Referring Phys: [id number] [name] IMPRESSIONS  1. Left ventricular ejection fraction, by estimation, is 45 to 50%. The left ventricle has mildly decreased  function. The left ventricle demonstrates regional wall motion abnormalities (see scoring diagram/findings for description). There is mild left ventricular hypertrophy. Left ventricular diastolic parameters are consistent with Grade I diastolic dysfunction (impaired relaxation).  2. Right ventricular systolic function is normal. The right ventricular size is normal.  3. A small pericardial effusion is present.  4. The mitral valve is normal in structure. Trivial mitral valve regurgitation.  5. The aortic valve was not well visualized. Aortic valve regurgitation is not visualized. Mild aortic valve stenosis. Vmax 2.0 m/s, MG , DI 0.52 FINDINGS  Left Ventricle: Left ventricular ejection fraction, by estimation, is 45 to 50%. The left ventricle has mildly decreased function. The left ventricle demonstrates regional wall motion abnormalities. The left ventricular internal cavity size was normal in size. There is mild left ventricular hypertrophy. Left ventricular diastolic parameters are consistent with Grade I diastolic dysfunction (impaired relaxation).  LV Wall Scoring: The apical septal segment, apical anterior segment, apical inferior segment, and apex are hypokinetic. The anterior wall, entire lateral wall, anterior septum, inferior wall, mid inferoseptal segment, and basal inferoseptal segment are normal. Right Ventricle: The right ventricular size is normal. No increase in right ventricular wall thickness. Right ventricular systolic function is normal. Left Atrium: Left atrial size was normal in size. Right Atrium: Right atrial size was normal in size. Pericardium: A small pericardial effusion is present. Mitral Valve: The mitral valve is normal in structure. Trivial mitral valve regurgitation. Tricuspid Valve: The tricuspid valve is normal in structure. Tricuspid valve regurgitation is not demonstrated. Aortic Valve: The aortic valve was not well visualized. Aortic valve regurgitation is not visualized.  Mild aortic stenosis is present. Aortic valve mean gradient measures 7.7 mmHg. Aortic valve peak gradient measures 14.0 mmHg. Aortic valve area, by VTI measures 1.37 cm. Pulmonic Valve: The pulmonic valve was not well visualized. Pulmonic valve regurgitation is not visualized. Aorta: The aortic root is normal in size and structure. IAS/Shunts: The interatrial septum was not well visualized.  LEFT VENTRICLE PLAX 2D LVIDd:         4.50 cm     Diastology LVIDs:         3.20 cm     LV e' medial:    6.74 cm/s LV PW:         1.10 cm     LV E/e' medial:  11.7 LV IVS:        1.10 cm     LV e' lateral:   8.05 cm/s LVOT diam:     1.80 cm     LV E/e' lateral: 9.8 LV SV:         41 LV SV Index:   25 LVOT Area:     2.54 cm  LV Volumes (MOD) LV vol d, MOD A2C: 64.9 ml LV vol d, MOD A4C: 71.7 ml LV vol s, MOD A2C: 34.2 ml LV vol s, MOD A4C: 34.5 ml LV SV MOD A2C:     30.7 ml LV SV MOD A4C:     71.7 ml LV SV MOD BP:      34.7 ml RIGHT VENTRICLE RV Basal diam:  2.65 cm RV Mid diam:    2.30 cm RV S prime:     12.60 cm/s TAPSE (M-mode): 1.9 cm LEFT  ATRIUM             Index        RIGHT ATRIUM           Index LA Vol (A2C):   30.7 ml 18.80 ml/m  RA Area:     10.10 cm LA Vol (A4C):   23.4 ml 14.33 ml/m  RA Volume:   17.30 ml  10.59 ml/m LA Biplane Vol: 26.5 ml 16.23 ml/m  AORTIC VALVE AV Area (Vmax):    1.39 cm AV Area (Vmean):   1.36 cm AV Area (VTI):     1.37 cm AV Vmax:           186.80 cm/s AV Vmean:          128.333 cm/s AV VTI:            0.304 m AV Peak Grad:      14.0 mmHg AV Mean Grad:      7.7 mmHg LVOT Vmax:         102.00 cm/s LVOT Vmean:        68.700 cm/s LVOT VTI:          0.163 m LVOT/AV VTI ratio: 0.54  AORTA Ao Root diam: 2.80 cm MITRAL VALVE MV Area (PHT): 4.89 cm     SHUNTS MV Decel Time: 155 msec     Systemic VTI:  0.16 m MV E velocity: 78.60 cm/s   Systemic Diam: 1.80 cm MV A velocity: 100.00 cm/s MV E/A ratio:  0.79 Carson Clara MD Electronically signed by Carson Clara MD Signature  Date/Time: 08/01/2023/12:42:19 PM    Final    US  ABDOMEN LIMITED RUQ (LIVER/GB) Result Date: 08/01/2023 CLINICAL DATA:  Encephalopathy.  Prior cholecystectomy EXAM: ULTRASOUND ABDOMEN LIMITED RIGHT UPPER QUADRANT COMPARISON:  Liver Doppler ultrasound 2015. CT abdomen pelvis 05/27/2014 FINDINGS: Gallbladder: Previous cholecystectomy Common bile duct: Diameter: 8 mm. Within normal limits for the patient's age in the post cholecystectomy state. Liver: No focal lesion identified. Within normal limits in parenchymal echogenicity. Portal vein is patent on color Doppler imaging with normal direction of blood flow towards the liver. Other: None. IMPRESSION: Previous cholecystectomy. No biliary ductal dilatation for the post cholecystectomy state Electronically Signed   By: Adrianna Horde M.D.   On: 08/01/2023 11:29   DG CHEST PORT 1 VIEW Result Date: 08/01/2023 CLINICAL DATA:  Endotracheal intubation EXAM: PORTABLE CHEST 1 VIEW COMPARISON:  07/28/2023 FINDINGS: Endotracheal tube tip satisfactorily positioned 2.9 cm above the carina. Nasogastric tube enters the stomach. Indistinct left lower lobe retrocardiac airspace opacity potentially from atelectasis or pneumonia. The lungs appear otherwise clear.  Heart size within normal limits. IMPRESSION: 1. Satisfactorily positioned endotracheal tube and nasogastric tube. 2. Indistinct left lower lobe retrocardiac airspace opacity potentially from atelectasis or pneumonia. Electronically Signed   By: Freida Jes M.D.   On: 08/01/2023 10:59   DG Abd 1 View Result Date: 08/01/2023 CLINICAL DATA:  Orogastric tube placement EXAM: ABDOMEN - 1 VIEW COMPARISON:  05/26/2014 CT scan FINDINGS: Orogastric tube tip is in the stomach body with side port just into the stomach body. Defibrillator pads and wiring noted projecting over the upper abdomen and lower chest. Indistinct retrocardiac opacity, cannot exclude left lower lobe atelectasis or pneumonia. Formed stool in borderline  prominent upper colon. IMPRESSION: 1. Orogastric tube tip is in the stomach body with side port just into the stomach body. 2. Indistinct retrocardiac opacity, cannot exclude left lower lobe atelectasis or pneumonia. 3. Mild prominence of stool in  the visualized colon, cannot exclude constipation. Electronically Signed   By: Freida Jes M.D.   On: 08/01/2023 10:57   CT ANGIO HEAD NECK W WO CM (CODE STROKE) Result Date: 08/01/2023 CLINICAL DATA:  Provided history: Neuro deficit, acute, stroke suspected. Unresponsive. Right-sided gaze. EXAM: CT ANGIOGRAPHY HEAD AND NECK WITH AND WITHOUT CONTRAST TECHNIQUE: Multidetector CT imaging of the head and neck was performed using the standard protocol during bolus administration of intravenous contrast. Multiplanar CT image reconstructions and MIPs were obtained to evaluate the vascular anatomy. Carotid stenosis measurements (when applicable) are obtained utilizing NASCET criteria, using the distal internal carotid diameter as the denominator. RADIATION DOSE REDUCTION: This exam was performed according to the departmental dose-optimization program which includes automated exposure control, adjustment of the mA and/or kV according to patient size and/or use of iterative reconstruction technique. CONTRAST:  75mL OMNIPAQUE  IOHEXOL  350 MG/ML SOLN COMPARISON:  Noncontrast head CT performed earlier today 08/01/2023. FINDINGS: CTA NECK FINDINGS Aortic arch: Standard aortic branching. Atherosclerotic plaque within the visualized aortic arch and proximal major branch vessels of the neck. No hemodynamically significant innominate or proximal subclavian artery stenosis. Right carotid system: CCA and ICA patent within the neck without stenosis. Mild atherosclerotic plaque scattered within the CCA and about the carotid bifurcation Left carotid system: CCA and ICA patent within the neck without stenosis. Mild atherosclerotic plaque scattered within the CCA and about the carotid  bifurcation. Vertebral arteries: Codominant and patent within the neck without stenosis or significant atherosclerotic disease. Skeleton: Mild dextrocurvature of the cervical spine. Slight levocurvature of the upper thoracic spine. Spondylosis of the cervical and visualized upper thoracic levels. The patient is edentulous. No acute fracture or aggressive osseous lesion. Other neck: No neck mass or cervical lymphadenopathy. Upper chest: No consolidation within the imaged lung apices. Partially visualized nasoenteric tube. An ET tube terminates above the level of carina. Review of the MIP images confirms the above findings CTA HEAD FINDINGS Anterior circulation: The intracranial internal carotid arteries are patent. Atherosclerotic plaque within the intracranial right ICA with no more than mild stenosis. Nonstenotic plaque within the intracranial left ICA. The M1 middle cerebral arteries are patent. No M2 proximal branch occlusion or high-grade proximal stenosis. The anterior cerebral arteries are patent. No intracranial aneurysm is identified. Posterior circulation: The intracranial vertebral arteries are patent. The basilar artery is patent. The posterior cerebral arteries are patent. Fetal origin left PCA. The right posterior communicating artery is diminutive or absent. Venous sinuses: Within the limitations of contrast timing, no convincing thrombus. Anatomic variants: As described. Review of the MIP images confirms the above findings No emergent large vessel occlusion identified. These results were called by telephone at the time of interpretation on 08/01/2023 at 10:10 am to provider ERIC [facility] , who verbally acknowledged these results. IMPRESSION: CTA neck: 1. Common carotid and internal carotid arteries patent within the neck without stenosis. Mild atherosclerotic plaque bilaterally, as described. 2. Vertebral arteries patent within the neck without stenosis or significant atherosclerotic disease. 3. Aortic  Atherosclerosis (ICD10-I70.0). CTA head: 1. No proximal intracranial large vessel occlusion or high-grade proximal arterial stenosis identified. 2. Atherosclerotic plaque within the intracranial internal carotid arteries with no more than mild stenosis. Electronically Signed   By: Bascom Lily D.O.   On: 08/01/2023 10:24   CT HEAD CODE STROKE WO CONTRAST Result Date: 08/01/2023 CLINICAL DATA:  Code stroke. Neuro deficit, acute, stroke suspected. Unresponsive. Right-sided gaze. EXAM: CT HEAD WITHOUT CONTRAST TECHNIQUE: Contiguous axial images were obtained from the base of  the skull through the vertex without intravenous contrast. RADIATION DOSE REDUCTION: This exam was performed according to the departmental dose-optimization program which includes automated exposure control, adjustment of the mA and/or kV according to patient size and/or use of iterative reconstruction technique. COMPARISON:  Head CT 01/26/2020. FINDINGS: Brain: Small chronic cortical infarcts again demonstrated within the posterior left frontal lobe and left parietal lobe. Background mild patchy and ill-defined hypoattenuation within the cerebral white matter, nonspecific but compatible chronic small vessel ischemic disease. There is no acute intracranial hemorrhage. No acute demarcated cortical infarct. No extra-axial fluid collection. No evidence of an intracranial mass. No midline shift. Vascular: No hyperdense vessel.  Atherosclerotic calcifications. Skull: No calvarial fracture or aggressive osseous lesion. Sinuses/Orbits: No mass or acute finding within the imaged orbits. No significant paranasal sinus disease at the imaged levels. ASPECTS [facility] Stroke Program Early CT Score) - Ganglionic level infarction (caudate, lentiform nuclei, internal capsule, insula, M1-M3 cortex): 7 - Supraganglionic infarction (M4-M6 cortex): 3 Total score (0-10 with 10 being normal): 10 (when discounting chronic infarcts). No evidence of an acute  intracranial abnormality. These results were communicated to Dr. Lindzen at 9:55 amon 6/13/2025by text page via the [facility] messaging system. IMPRESSION: 1.  No evidence of an acute intracranial abnormality. 2. Small chronic cortically-based infarcts again demonstrated within the posterior left frontal lobe and left parietal lobe. 3. Background mild cerebral white matter chronic small vessel ischemic disease. Electronically Signed   By: Bascom Lily D.O.   On: 08/01/2023 09:56   CARDIAC CATHETERIZATION Result Date: 07/31/2023 Images from the original result were not included. Coronary angiography 07/31/2023: LM: Normal LAD: Mid occlusion, weakly collateralized from RCA/PDA and Lcx Lcx: Patent mid Lcx stent, no restenosis RCA: Patent prox-mid stent, no restenosis LVEDP 13 mmHg I suspect this is chronic occlusion of mid LAD. Recommend medical management for CAD and HFrEF for now. Based on patient situation, LAD revascularization could be considered in future if there is true angina and/or viable myocardium. Cody Das, MD    Cardiac Studies   Patient Profile   63 y.o. female with CAD, hypertension, tobacco use, hyperlipidemia, schizophrenia/bipolar disorder who was admitted with NSTEMI.  Assessment & Plan   NSTEMI: Has history of STEMI with stenting of distal LCx, RCA in 2021 post complicated by V-fib arrest.  She presented to [facility] on 6/9 with altered mental status, found to have troponin elevation.  Echocardiogram at [facility] showed EF 35 to 40%.  She was transferred to [facility] for cath.  Cath on 6/12 showed patent LCx and RCA stents, chronic occlusion in mid LAD with collaterals, diagonal mid and recommended - Continue aspirin , statin  Acute systolic heart failure: Echocardiogram at [facility] with EF 35 to 40%.  Echo repeated on 6/13 and showed EF 45 to 50% with apical hypokinesis - Hypotensive since intubation, currently requiring low dose Levophed.  Will fold in GDMT once BP improved  AMS:  Worsening encephalopathy, required intubation for airway protection on 6/13.  Neurology consulted, no acute abnormalities on head CT.  MRI brain ordered.  Workup showed significantly elevated ammonia, suspect contributing to encephalopathy, though unclear cause as no evidence of cirrhosis on imaging.  Started on lactulose  and rifaximin per PCCM  Acute respiratory failure: Intubated, management per PCCM   CRITICAL CARE TIME: I have spent a total of 32 minutes with patient reviewing hospital notes, telemetry, EKGs, labs and examining the patient as well as establishing an assessment and plan that was discussed with the patient's family.  >  50% of time was spent in direct patient care. The patient is critically ill with multi-organ system failure and requires high complexity decision making for assessment and support, frequent evaluation and titration of therapies, application of advanced monitoring technologies and extensive interpretation of multiple databases.   For questions or updates, please contact [facility] Please consult [email or web address] for contact info under     Signed, Wendie Hamburg, MD  08/02/2023, 9:24 AM

## 2023-08-02 NOTE — Assessment & Plan Note (Signed)
 Likely demand related based on absence of new lesions.   - Continue secondary prevention: ASA and atorvastatin .

## 2023-08-02 NOTE — Assessment & Plan Note (Signed)
 Creatinine has remained stable.

## 2023-08-03 DIAGNOSIS — I5021 Acute systolic (congestive) heart failure: Secondary | ICD-10-CM | POA: Diagnosis not present

## 2023-08-03 DIAGNOSIS — G9341 Metabolic encephalopathy: Secondary | ICD-10-CM | POA: Diagnosis not present

## 2023-08-03 DIAGNOSIS — N179 Acute kidney failure, unspecified: Secondary | ICD-10-CM | POA: Diagnosis not present

## 2023-08-03 DIAGNOSIS — I952 Hypotension due to drugs: Secondary | ICD-10-CM | POA: Diagnosis not present

## 2023-08-03 DIAGNOSIS — I214 Non-ST elevation (NSTEMI) myocardial infarction: Secondary | ICD-10-CM | POA: Diagnosis not present

## 2023-08-03 DIAGNOSIS — N1832 Chronic kidney disease, stage 3b: Secondary | ICD-10-CM | POA: Diagnosis not present

## 2023-08-03 LAB — CBC
HCT: 33 % — ABNORMAL LOW (ref 36.0–46.0)
Hemoglobin: 10.2 g/dL — ABNORMAL LOW (ref 12.0–15.0)
MCH: 27.3 pg (ref 26.0–34.0)
MCHC: 30.9 g/dL (ref 30.0–36.0)
MCV: 88.5 fL (ref 80.0–100.0)
Platelets: 109 10*3/uL — ABNORMAL LOW (ref 150–400)
RBC: 3.73 MIL/uL — ABNORMAL LOW (ref 3.87–5.11)
RDW: 18.9 % — ABNORMAL HIGH (ref 11.5–15.5)
WBC: 8.8 10*3/uL (ref 4.0–10.5)
nRBC: 0 % (ref 0.0–0.2)

## 2023-08-03 LAB — BASIC METABOLIC PANEL WITH GFR
Anion gap: 14 (ref 5–15)
BUN: 32 mg/dL — ABNORMAL HIGH (ref 8–23)
CO2: 19 mmol/L — ABNORMAL LOW (ref 22–32)
Calcium: 6.9 mg/dL — ABNORMAL LOW (ref 8.9–10.3)
Chloride: 114 mmol/L — ABNORMAL HIGH (ref 98–111)
Creatinine, Ser: 1.57 mg/dL — ABNORMAL HIGH (ref 0.44–1.00)
GFR, Estimated: 37 mL/min — ABNORMAL LOW (ref >60)
Glucose, Bld: 82 mg/dL (ref 70–99)
Potassium: 3.6 mmol/L (ref 3.5–5.1)
Sodium: 147 mmol/L — ABNORMAL HIGH (ref 135–145)

## 2023-08-03 LAB — MAGNESIUM
Magnesium: 2.1 mg/dL (ref 1.7–2.4)
Magnesium: 2.2 mg/dL (ref 1.7–2.4)
Magnesium: 2 mg/dL (ref 1.7–2.4)

## 2023-08-03 LAB — GLUCOSE, CAPILLARY
Glucose-Capillary: 88 mg/dL (ref 70–99)
Glucose-Capillary: 93 mg/dL (ref 70–99)
Glucose-Capillary: 100 mg/dL — ABNORMAL HIGH (ref 70–99)
Glucose-Capillary: 120 mg/dL — ABNORMAL HIGH (ref 70–99)
Glucose-Capillary: 95 mg/dL (ref 70–99)
Glucose-Capillary: 106 mg/dL — ABNORMAL HIGH (ref 70–99)

## 2023-08-03 LAB — PHOSPHORUS
Phosphorus: 2.8 mg/dL (ref 2.5–4.6)
Phosphorus: 2.8 mg/dL (ref 2.5–4.6)
Phosphorus: 2.8 mg/dL (ref 2.5–4.6)

## 2023-08-03 MED ORDER — PROSOURCE TF20 ENFIT COMPATIBL EN LIQD
60.0000 mL | Freq: Every day | ENTERAL | Status: DC
  Administered 2023-08-03 – 2023-08-06 (×3): 60 mL
  Filled 2023-08-03 (×5): qty 60

## 2023-08-03 MED ORDER — FREE WATER
200.0000 mL | Status: DC
  Administered 2023-08-03 – 2023-08-04 (×6): 200 mL

## 2023-08-03 MED ORDER — VITAL HIGH PROTEIN PO LIQD
1000.0000 mL | ORAL | Status: DC
  Administered 2023-08-03: 1000 mL

## 2023-08-03 MED ORDER — POTASSIUM CHLORIDE 20 MEQ PO PACK
40.0000 meq | PACK | Freq: Once | ORAL | Status: AC
  Administered 2023-08-03: 40 meq
  Filled 2023-08-03: qty 2

## 2023-08-03 NOTE — Progress Notes (Signed)
 [facility] ADULT ICU REPLACEMENT PROTOCOL   The patient does apply for the [facility] Adult ICU Electrolyte Replacment Protocol based on the criteria listed below:   1.Exclusion criteria: TCTS, ECMO, Dialysis, and Myasthenia Gravis patients 2. Is GFR >/= 30 ml/min? Yes.    Patient's GFR today is 37 3. Is SCr </= 2? Yes.   Patient's SCr is 1.57 mg/dL 4. Did SCr increase >/= 0.5 in 24 hours? No. 5.Pt's weight >40kg  Yes.   6. Abnormal electrolyte(s): K  7. Electrolytes replaced per protocol 8.  Call MD STAT for K+ </= 2.5, Phos </= 1, or Mag </= 1 Physician:  Allegra Arch [facility] 08/03/2023 6:38 AM

## 2023-08-03 NOTE — Consult Note (Signed)
 NAME:  Sarah Davis, MRN:  [id number], DOB:  [birth year], LOS: 4 ADMISSION DATE:  07/30/2023, CONSULTATION DATE: 08/01/2023 REFERRING MD: Cardiology, CHIEF COMPLAINT: Acute respiratory distress  History of Present Illness:  63 year old female who underwent cardiac catheterization 07/31/2023 without acute interventions required.  She does have a history of polysubstance abuse, tobacco abuse, bipolar, thyroid disease and at the time of pulmonary critical care arriving at her bedside she was in acute respiratory failure and required intubation emergently.  She transferred to the intensive care unit.  She is currently on Levophed drip.  Chest x-ray, ABG, CT of the head have been ordered at this time.  Pertinent  Medical History   Past Medical History:  Diagnosis Date   Bipolar 2 disorder (HCC)    CKD (chronic kidney disease), stage III (HCC)    COPD (chronic obstructive pulmonary disease) (HCC)    Coronary artery disease    Hypothyroid    Neuropathy    Stroke (HCC)    Tobacco abuse    Significant Hospital Events: Including procedures, antibiotic start and stop dates in addition to other pertinent events   6/12 - CT negative for LVO, MRI negative. No seizures on EEG.  6/14 remains somnolent.   Interim History / Subjective:  More awake today. Off pressors.   Objective    Blood pressure 132/73, pulse 65, temperature (!) 96.2 F (35.7 C), temperature source Axillary, resp. rate 12, height 5' 5 (1.651 m), weight 57.8 kg, SpO2 100%.    Vent Mode: PSV;CPAP FiO2 (%):  [35 %-40 %] 40 % Set Rate:  [18 bmp-24 bmp] 18 bmp Vt Set:  [450 mL] 450 mL PEEP:  [5 cmH20] 5 cmH20 Pressure Support:  [5 cmH20] 5 cmH20 Plateau Pressure:  [11 cmH20-24 cmH20] 12 cmH20   Intake/Output Summary (Last 24 hours) at 08/03/2023 1255 Last data filed at 08/03/2023 0814 Gross per 24 hour  Intake 2515.21 ml  Output 1940 ml  Net 575.21 ml   Filed Weights   07/30/23 2041 08/01/23 0923  Weight: 55.1 kg 57.8 kg     Examination: General: appears older than stated age. HENT: ETT and OGT tube in place.  Lungs: Clear bilaterally. Tolerating SBT Cardiovascular: HS are normal Abdomen: Soft  Extremities: mild edema. + skin fragility Neuro: now opening eyes to voice, moving limbs spontaneously but not to command.  Ancillary Tests  Ammonia 49 Mild hypernatremia 147 Creatinine improving.   Assessment and Plan   Acute metabolic encephalopathy Presumed related to hyperammonemia, although cause of latter unclear. Remains on low dose propofol. Minimally responsive on examination to pain, no response to voice. EEG shows no seizures.   - Obtain MRI today (without contrast due to AKI)  - Stop sedation after and allow time to wake up.  - SBT and extubate if possible.  Increased ammonia level No evidence of cirrhosis on imaging or by LFT's. Not on medications susceptible to cause elevated ammonia.  Copious stool output with lactulose .  - Continue lactulose  and rifaximin and trend ammonia levels.  - Continue empiric ceftriaxone cryptic infection causing increased ammonia.   Chronic kidney disease (CKD) stage G3b/A2, moderately decreased glomerular filtration rate (GFR) between 30-44 mL/min/1.73 square meter and albuminuria creatinine ratio between 30-299 mg/g (HCC) Creatinine has remained stable.   NSTEMI (non-ST elevated myocardial infarction) (HCC) Likely demand related based on absence of new lesions.   - Continue secondary prevention: ASA and atorvastatin .    Best Practice (right click and Reselect all SmartList Selections daily)   Diet/type:  NPO - tube feeds.  DVT prophylaxis prophylactic heparin   Pressure ulcer(s): N/A GI prophylaxis: PPI Lines: N/A Foley:  N/A Code Status:  full code Last date of multidisciplinary goals of care discussion [tbd]  CRITICAL CARE Performed by: Arlina Lair   Total critical care time: 45 minutes  Critical care time was exclusive of separately  billable procedures and treating other patients.  Critical care was necessary to treat or prevent imminent or life-threatening deterioration.  Critical care was time spent personally by me on the following activities: development of treatment plan with patient and/or surrogate as well as nursing, discussions with consultants, evaluation of patient's response to treatment, examination of patient, obtaining history from patient or surrogate, ordering and performing treatments and interventions, ordering and review of laboratory studies, ordering and review of radiographic studies, pulse oximetry, re-evaluation of patient's condition and participation in multidisciplinary rounds.  Arlina Lair, MD [facility] ICU Physician [facility] [place] [facility]  Pager: [phone] Mobile: [phone] After hours: [phone].

## 2023-08-03 NOTE — Progress Notes (Addendum)
 Rounding Note   Patient Name: Sarah Davis Date of Encounter: 08/03/2023  [facility] Cardiologist: None   Subjective Renal function improving (creatinine 1.93 > 1.82 > 1.57).  Ammonia has improved from 252 to 49.  She is off pressors. She is off sedation but not following commands  Scheduled Meds:  aspirin   81 mg Per Tube Daily   atorvastatin   80 mg Per Tube Daily   Chlorhexidine Gluconate Cloth  6 each Topical Daily   docusate  100 mg Per Tube BID   famotidine  20 mg Per Tube Daily   free water  200 mL Per Tube Q4H   heparin  injection (subcutaneous)  5,000 Units Subcutaneous Q8H   insulin  aspart  1-3 Units Subcutaneous Q4H   lactulose   30 g Per Tube TID   levothyroxine   100 mcg Per Tube q morning   mouth rinse  15 mL Mouth Rinse Q2H   polyethylene glycol  17 g Per Tube Daily   potassium chloride   40 mEq Per Tube Once   rifaximin  550 mg Per Tube BID   sodium chloride  flush  3 mL Intravenous Q12H   Continuous Infusions:  sodium chloride  75 mL/hr at 08/03/23 0736   cefTRIAXone (ROCEPHIN)  IV Stopped (08/02/23 1735)   norepinephrine (LEVOPHED) Adult infusion Stopped (08/02/23 1652)   propofol (DIPRIVAN) infusion Stopped (08/03/23 0701)   sodium bicarbonate 150 mEq in dextrose  5 % 1,150 mL infusion 50 mL/hr at 08/03/23 0736   sodium chloride      PRN Meds: acetaminophen , albuterol, nitroGLYCERIN , ondansetron  (ZOFRAN ) IV, mouth rinse, sodium chloride  flush   Vital Signs  Vitals:   08/03/23 0630 08/03/23 0645 08/03/23 0700 08/03/23 0711  BP: 125/72 97/64 121/66   Pulse: 73 (!) 54 65   Resp: 18 19 18    Temp:    97.6 F (36.4 C)  TempSrc:    Oral  SpO2: 100% 100% 100%   Weight:      Height:        Intake/Output Summary (Last 24 hours) at 08/03/2023 0817 Last data filed at 08/03/2023 0736 Gross per 24 hour  Intake 3031.44 ml  Output 2465 ml  Net 566.44 ml      08/01/2023    9:23 AM 07/30/2023    8:41 PM 09/05/2018    6:15 PM  Last 3 Weights  Weight (lbs)  127 lb 6.8 oz 121 lb 6.4 oz 125 lb  Weight (kg) 57.8 kg 55.067 kg 56.7 kg      Telemetry NSR - Personally Reviewed  ECG  No new ECG - Personally Reviewed  Physical Exam  GEN: Intubated Neck: No JVD Cardiac: RRR, no murmurs, rubs, or gallops.  Respiratory: Mechanical breath sounds GI: Soft MS: No edema Neuro:  not following commands Psych: Unable  to assess  Labs High Sensitivity Troponin:   Recent Labs  Lab 07/30/23 2017  TROPONINIHS 1,235*     Chemistry Recent Labs  Lab 07/31/23 1746 08/01/23 0516 08/01/23 1039 08/02/23 0227 08/03/23 0414  NA 140   < > 143 145 147*  K 4.8   < > 4.9 3.0* 3.6  CL 107   < > 109 110 114*  CO2 19*   < > 15* 16* 19*  GLUCOSE 217*   < > 171* 94 82  BUN 23   < > 34* 35* 32*  CREATININE 1.67*   < > 1.93* 1.82* 1.57*  CALCIUM  8.2*   < > 7.7* 7.4* 6.9*  MG  --   --  2.1  --  2.1  PROT 4.9*  --  4.9*  --   --   ALBUMIN 2.4*  --  2.5*  --   --   AST 21  --  29  --   --   ALT 11  --  15  --   --   ALKPHOS 75  --  79  --   --   BILITOT 0.5  --  0.6  --   --   GFRNONAA 34*   < > 29* 31* 37*  ANIONGAP 14   < > 19* 19* 14   < > = values in this interval not displayed.    Lipids  Recent Labs  Lab 08/01/23 0516 08/02/23 0227  CHOL 125  --   TRIG 237* 150*  HDL 31*  --   LDLCALC 47  --   CHOLHDL 4.0  --     Hematology Recent Labs  Lab 08/01/23 0516 08/01/23 1017 08/02/23 0227 08/03/23 0414  WBC 9.9  --  15.5* 8.8  RBC 4.30  --  3.99 3.73*  HGB 11.5* 10.5* 10.7* 10.2*  HCT 38.2 31.0* 34.6* 33.0*  MCV 88.8  --  86.7 88.5  MCH 26.7  --  26.8 27.3  MCHC 30.1  --  30.9 30.9  RDW 18.2*  --  18.5* 18.9*  PLT 209  --  232 109*   Thyroid  Recent Labs  Lab 08/01/23 1039  TSH 0.717    BNPNo results for input(s): BNP, PROBNP in the last 168 hours.  DDimer No results for input(s): DDIMER in the last 168 hours.   Radiology  MR BRAIN WO CONTRAST Result Date: 08/02/2023 EXAM: MRI BRAIN WITHOUT CONTRAST 08/02/2023  10:57:40 AM TECHNIQUE: Multiplanar multisequence MRI of the head/brain was performed without the administration of intravenous contrast. COMPARISON: CT head without contrast and CT angio head and neck 08/01/2023. CLINICAL HISTORY: Mental status change, persistent or worsening. FINDINGS: BRAIN AND VENTRICLES: No acute infarct. No intracranial hemorrhage. No mass. No midline shift. No hydrocephalus. The sella is unremarkable. Normal flow voids. Scattered cortical and subcortical T2 and FLAIR hyperintensities bilaterally are moderately advanced for age. Remote cortical infarcts are present in the high posterior left frontal lobe and parietal lobe. ORBITS: No acute abnormality. SINUSES AND MASTOIDS: Small mastoid effusions are present. No obstructing nasopharyngeal lesion is present. BONES AND SOFT TISSUES: Normal marrow signal. No acute soft tissue abnormality. IMPRESSION: 1. No acute intracranial abnormality. 2. Moderately advanced scattered cortical and subcortical T2 and FLAIR hyperintensities bilaterally for age. This likely reflects the sequelae of chronic microvascular ischemia. 3. 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 Electronically signed by: Audree Leas MD 08/02/2023 11:15 AM EDT RP Workstation: [id number]   EEG adult Result Date: 08/01/2023 Sarah Lack, MD     08/01/2023  7:25 PM Patient Name: Sarah Davis MRN: [id number] Epilepsy Attending: Arleene Davis Referring Physician/Provider: Arlina Lair, MD Date: 08/01/2023 Duration: 30.57 mins Patient history: 63yo F with right preferential gaze. EEG to evaluate for seizure. Level of alertness: comatose/ lethargic AEDs during EEG study: Propofol, Versed Technical aspects: This EEG study was done with scalp electrodes positioned according to the 10-20 International system of electrode placement. Electrical activity was reviewed with band pass filter of 1-70Hz , sensitivity of 7 uV/mm, display speed of  8mm/sec with a 60Hz  notched filter applied as appropriate. EEG data were recorded continuously and digitally stored.  Video monitoring was available and reviewed as appropriate.  Description: EEG showed continuous generalized rhythmic 2-3hz  delta slowing. Hyperventilation and photic stimulation were not performed.   EEG was technically difficult due to significant myogenic artifact. ABNORMALITY - Continuous slow, generalized IMPRESSION: This technically difficult study is suggestive of severe diffuse encephalopathy. No seizures or epileptiform discharges were seen throughout the recording. Sarah Davis   ECHOCARDIOGRAM COMPLETE Result Date: 08/01/2023    ECHOCARDIOGRAM REPORT   Patient Name:   ZSAZSA BAHENA  Date of Exam: 08/01/2023 Medical Rec #:  [id number]  Height:       65.0 in Accession #:    [id number] Weight:       127.4 lb Date of Birth:  Dec 21, 1960  BSA:          1.633 m Patient Age:    62 years   BP:           103/58 mmHg Patient Gender: F          HR:           100 bpm. Exam Location:  Inpatient Procedure: 2D Echo, Color Doppler and Cardiac Doppler (Both Spectral and Color            Flow Doppler were utilized during procedure). Indications:    NSTEMI  History:        Patient has no prior history of Echocardiogram examinations.  Sonographer:    Jeralene Mom Referring Phys: [id number] [name] IMPRESSIONS  1. Left ventricular ejection fraction, by estimation, is 45 to 50%. The left ventricle has mildly decreased function. The left ventricle demonstrates regional wall motion abnormalities (see scoring diagram/findings for description). There is mild left ventricular hypertrophy. Left ventricular diastolic parameters are consistent with Grade I diastolic dysfunction (impaired relaxation).  2. Right ventricular systolic function is normal. The right ventricular size is normal.  3. A small pericardial effusion is present.  4. The mitral valve is normal in structure. Trivial mitral valve regurgitation.   5. The aortic valve was not well visualized. Aortic valve regurgitation is not visualized. Mild aortic valve stenosis. Vmax 2.0 m/s, MG , DI 0.52 FINDINGS  Left Ventricle: Left ventricular ejection fraction, by estimation, is 45 to 50%. The left ventricle has mildly decreased function. The left ventricle demonstrates regional wall motion abnormalities. The left ventricular internal cavity size was normal in size. There is mild left ventricular hypertrophy. Left ventricular diastolic parameters are consistent with Grade I diastolic dysfunction (impaired relaxation).  LV Wall Scoring: The apical septal segment, apical anterior segment, apical inferior segment, and apex are hypokinetic. The anterior wall, entire lateral wall, anterior septum, inferior wall, mid inferoseptal segment, and basal inferoseptal segment are normal. Right Ventricle: The right ventricular size is normal. No increase in right ventricular wall thickness. Right ventricular systolic function is normal. Left Atrium: Left atrial size was normal in size. Right Atrium: Right atrial size was normal in size. Pericardium: A small pericardial effusion is present. Mitral Valve: The mitral valve is normal in structure. Trivial mitral valve regurgitation. Tricuspid Valve: The tricuspid valve is normal in structure. Tricuspid valve regurgitation is not demonstrated. Aortic Valve: The aortic valve was not well visualized. Aortic valve regurgitation is not visualized. Mild aortic stenosis is present. Aortic valve mean gradient measures 7.7 mmHg. Aortic valve peak gradient measures 14.0 mmHg. Aortic valve area, by VTI measures 1.37 cm. Pulmonic Valve: The pulmonic valve was not well visualized. Pulmonic valve regurgitation is not visualized. Aorta: The aortic root is normal in size and structure. IAS/Shunts: The interatrial septum was not well  visualized.  LEFT VENTRICLE PLAX 2D LVIDd:         4.50 cm     Diastology LVIDs:         3.20 cm     LV e' medial:     6.74 cm/s LV PW:         1.10 cm     LV E/e' medial:  11.7 LV IVS:        1.10 cm     LV e' lateral:   8.05 cm/s LVOT diam:     1.80 cm     LV E/e' lateral: 9.8 LV SV:         41 LV SV Index:   25 LVOT Area:     2.54 cm  LV Volumes (MOD) LV vol d, MOD A2C: 64.9 ml LV vol d, MOD A4C: 71.7 ml LV vol s, MOD A2C: 34.2 ml LV vol s, MOD A4C: 34.5 ml LV SV MOD A2C:     30.7 ml LV SV MOD A4C:     71.7 ml LV SV MOD BP:      34.7 ml RIGHT VENTRICLE RV Basal diam:  2.65 cm RV Mid diam:    2.30 cm RV S prime:     12.60 cm/s TAPSE (M-mode): 1.9 cm LEFT ATRIUM             Index        RIGHT ATRIUM           Index LA Vol (A2C):   30.7 ml 18.80 ml/m  RA Area:     10.10 cm LA Vol (A4C):   23.4 ml 14.33 ml/m  RA Volume:   17.30 ml  10.59 ml/m LA Biplane Vol: 26.5 ml 16.23 ml/m  AORTIC VALVE AV Area (Vmax):    1.39 cm AV Area (Vmean):   1.36 cm AV Area (VTI):     1.37 cm AV Vmax:           186.80 cm/s AV Vmean:          128.333 cm/s AV VTI:            0.304 m AV Peak Grad:      14.0 mmHg AV Mean Grad:      7.7 mmHg LVOT Vmax:         102.00 cm/s LVOT Vmean:        68.700 cm/s LVOT VTI:          0.163 m LVOT/AV VTI ratio: 0.54  AORTA Ao Root diam: 2.80 cm MITRAL VALVE MV Area (PHT): 4.89 cm     SHUNTS MV Decel Time: 155 msec     Systemic VTI:  0.16 m MV E velocity: 78.60 cm/s   Systemic Diam: 1.80 cm MV A velocity: 100.00 cm/s MV E/A ratio:  0.79 Carson Clara MD Electronically signed by Carson Clara MD Signature Date/Time: 08/01/2023/12:42:19 PM    Final    DG CHEST PORT 1 VIEW Result Date: 08/01/2023 CLINICAL DATA:  Endotracheal intubation EXAM: PORTABLE CHEST 1 VIEW COMPARISON:  07/28/2023 FINDINGS: Endotracheal tube tip satisfactorily positioned 2.9 cm above the carina. Nasogastric tube enters the stomach. Indistinct left lower lobe retrocardiac airspace opacity potentially from atelectasis or pneumonia. The lungs appear otherwise clear.  Heart size within normal limits. IMPRESSION: 1. Satisfactorily  positioned endotracheal tube and nasogastric tube. 2. Indistinct left lower lobe retrocardiac airspace opacity potentially from atelectasis or pneumonia. Electronically Signed   By: Freida Jes M.D.   On: 08/01/2023  10:59   DG Abd 1 View Result Date: 08/01/2023 CLINICAL DATA:  Orogastric tube placement EXAM: ABDOMEN - 1 VIEW COMPARISON:  05/26/2014 CT scan FINDINGS: Orogastric tube tip is in the stomach body with side port just into the stomach body. Defibrillator pads and wiring noted projecting over the upper abdomen and lower chest. Indistinct retrocardiac opacity, cannot exclude left lower lobe atelectasis or pneumonia. Formed stool in borderline prominent upper colon. IMPRESSION: 1. Orogastric tube tip is in the stomach body with side port just into the stomach body. 2. Indistinct retrocardiac opacity, cannot exclude left lower lobe atelectasis or pneumonia. 3. Mild prominence of stool in the visualized colon, cannot exclude constipation. Electronically Signed   By: Freida Jes M.D.   On: 08/01/2023 10:57   CT ANGIO HEAD NECK W WO CM (CODE STROKE) Result Date: 08/01/2023 CLINICAL DATA:  Provided history: Neuro deficit, acute, stroke suspected. Unresponsive. Right-sided gaze. EXAM: CT ANGIOGRAPHY HEAD AND NECK WITH AND WITHOUT CONTRAST TECHNIQUE: Multidetector CT imaging of the head and neck was performed using the standard protocol during bolus administration of intravenous contrast. Multiplanar CT image reconstructions and MIPs were obtained to evaluate the vascular anatomy. Carotid stenosis measurements (when applicable) are obtained utilizing NASCET criteria, using the distal internal carotid diameter as the denominator. RADIATION DOSE REDUCTION: This exam was performed according to the departmental dose-optimization program which includes automated exposure control, adjustment of the mA and/or kV according to patient size and/or use of iterative reconstruction technique. CONTRAST:  75mL  OMNIPAQUE  IOHEXOL  350 MG/ML SOLN COMPARISON:  Noncontrast head CT performed earlier today 08/01/2023. FINDINGS: CTA NECK FINDINGS Aortic arch: Standard aortic branching. Atherosclerotic plaque within the visualized aortic arch and proximal major branch vessels of the neck. No hemodynamically significant innominate or proximal subclavian artery stenosis. Right carotid system: CCA and ICA patent within the neck without stenosis. Mild atherosclerotic plaque scattered within the CCA and about the carotid bifurcation Left carotid system: CCA and ICA patent within the neck without stenosis. Mild atherosclerotic plaque scattered within the CCA and about the carotid bifurcation. Vertebral arteries: Codominant and patent within the neck without stenosis or significant atherosclerotic disease. Skeleton: Mild dextrocurvature of the cervical spine. Slight levocurvature of the upper thoracic spine. Spondylosis of the cervical and visualized upper thoracic levels. The patient is edentulous. No acute fracture or aggressive osseous lesion. Other neck: No neck mass or cervical lymphadenopathy. Upper chest: No consolidation within the imaged lung apices. Partially visualized nasoenteric tube. An ET tube terminates above the level of carina. Review of the MIP images confirms the above findings CTA HEAD FINDINGS Anterior circulation: The intracranial internal carotid arteries are patent. Atherosclerotic plaque within the intracranial right ICA with no more than mild stenosis. Nonstenotic plaque within the intracranial left ICA. The M1 middle cerebral arteries are patent. No M2 proximal branch occlusion or high-grade proximal stenosis. The anterior cerebral arteries are patent. No intracranial aneurysm is identified. Posterior circulation: The intracranial vertebral arteries are patent. The basilar artery is patent. The posterior cerebral arteries are patent. Fetal origin left PCA. The right posterior communicating artery is diminutive  or absent. Venous sinuses: Within the limitations of contrast timing, no convincing thrombus. Anatomic variants: As described. Review of the MIP images confirms the above findings No emergent large vessel occlusion identified. These results were called by telephone at the time of interpretation on 08/01/2023 at 10:10 am to provider ERIC [facility] , who verbally acknowledged these results. IMPRESSION: CTA neck: 1. Common carotid and internal carotid arteries patent within  the neck without stenosis. Mild atherosclerotic plaque bilaterally, as described. 2. Vertebral arteries patent within the neck without stenosis or significant atherosclerotic disease. 3. Aortic Atherosclerosis (ICD10-I70.0). CTA head: 1. No proximal intracranial large vessel occlusion or high-grade proximal arterial stenosis identified. 2. Atherosclerotic plaque within the intracranial internal carotid arteries with no more than mild stenosis. Electronically Signed   By: Bascom Lily D.O.   On: 08/01/2023 10:24   CT HEAD CODE STROKE WO CONTRAST Result Date: 08/01/2023 CLINICAL DATA:  Code stroke. Neuro deficit, acute, stroke suspected. Unresponsive. Right-sided gaze. EXAM: CT HEAD WITHOUT CONTRAST TECHNIQUE: Contiguous axial images were obtained from the base of the skull through the vertex without intravenous contrast. RADIATION DOSE REDUCTION: This exam was performed according to the departmental dose-optimization program which includes automated exposure control, adjustment of the mA and/or kV according to patient size and/or use of iterative reconstruction technique. COMPARISON:  Head CT 01/26/2020. FINDINGS: Brain: Small chronic cortical infarcts again demonstrated within the posterior left frontal lobe and left parietal lobe. Background mild patchy and ill-defined hypoattenuation within the cerebral white matter, nonspecific but compatible chronic small vessel ischemic disease. There is no acute intracranial hemorrhage. No acute demarcated  cortical infarct. No extra-axial fluid collection. No evidence of an intracranial mass. No midline shift. Vascular: No hyperdense vessel.  Atherosclerotic calcifications. Skull: No calvarial fracture or aggressive osseous lesion. Sinuses/Orbits: No mass or acute finding within the imaged orbits. No significant paranasal sinus disease at the imaged levels. ASPECTS [facility] Stroke Program Early CT Score) - Ganglionic level infarction (caudate, lentiform nuclei, internal capsule, insula, M1-M3 cortex): 7 - Supraganglionic infarction (M4-M6 cortex): 3 Total score (0-10 with 10 being normal): 10 (when discounting chronic infarcts). No evidence of an acute intracranial abnormality. These results were communicated to Dr. Lindzen at 9:55 amon 6/13/2025by text page via the [facility] messaging system. IMPRESSION: 1.  No evidence of an acute intracranial abnormality. 2. Small chronic cortically-based infarcts again demonstrated within the posterior left frontal lobe and left parietal lobe. 3. Background mild cerebral white matter chronic small vessel ischemic disease. Electronically Signed   By: Bascom Lily D.O.   On: 08/01/2023 09:56    Cardiac Studies   Patient Profile   63 y.o. female with CAD, hypertension, tobacco use, hyperlipidemia, schizophrenia/bipolar disorder who was admitted with NSTEMI.  Assessment & Plan   NSTEMI: Has history of STEMI with stenting of distal LCx, RCA in 2021 post complicated by V-fib arrest.  She presented to [facility] on 6/9 with altered mental status, found to have troponin elevation.  Echocardiogram at [facility] showed EF 35 to 40%.  She was transferred to [facility] for cath.  Cath on 6/12 showed patent LCx and RCA stents, chronic occlusion in mid LAD with collaterals, medical management recommended - Continue aspirin , statin  Acute systolic heart failure: Echocardiogram at [facility] with EF 35 to 40%.  Echo repeated on 6/13 and showed EF 45 to 50% with apical hypokinesis - Hypotensive  following intubation, initially required levophed but has been weaned off.  Will fold in GDMT if BP remains stable  AMS: Worsening encephalopathy, required intubation for airway protection on 6/13.  Neurology consulted, no acute abnormalities on head CT.  MRI brain ordered.  Workup showed significantly elevated ammonia, suspect contributing to encephalopathy, though unclear cause as no evidence of cirrhosis on imaging.  Started on lactulose  and rifaximin per PCCM  Acute respiratory failure: Intubated, management per PCCM  AKI: Creatinine up to 1.93 on 6/13.  She has been given IV fluids,  creatinine improved to 1.57 today  CRITICAL CARE TIME: I have spent a total of 31 minutes with patient reviewing hospital notes, telemetry, EKGs, labs and examining the patient as well as establishing an assessment and plan.  > 50% of time was spent in direct patient care. The patient is critically ill with multi-organ system failure and requires high complexity decision making for assessment and support, frequent evaluation and titration of therapies, application of advanced monitoring technologies and extensive interpretation of multiple databases.   For questions or updates, please contact [facility] Please consult [email or web address] for contact info under     Signed, Wendie Hamburg, MD  08/03/2023, 8:17 AM

## 2023-08-04 ENCOUNTER — Other Ambulatory Visit: Payer: Self-pay

## 2023-08-04 ENCOUNTER — Inpatient Hospital Stay (HOSPITAL_COMMUNITY): Payer: MEDICAID

## 2023-08-04 ENCOUNTER — Encounter (HOSPITAL_COMMUNITY): Payer: Self-pay | Admitting: Cardiovascular Disease

## 2023-08-04 DIAGNOSIS — N1832 Chronic kidney disease, stage 3b: Secondary | ICD-10-CM | POA: Diagnosis not present

## 2023-08-04 DIAGNOSIS — J9601 Acute respiratory failure with hypoxia: Secondary | ICD-10-CM

## 2023-08-04 DIAGNOSIS — N179 Acute kidney failure, unspecified: Secondary | ICD-10-CM | POA: Diagnosis not present

## 2023-08-04 DIAGNOSIS — E870 Hyperosmolality and hypernatremia: Secondary | ICD-10-CM

## 2023-08-04 DIAGNOSIS — I5021 Acute systolic (congestive) heart failure: Secondary | ICD-10-CM | POA: Diagnosis not present

## 2023-08-04 DIAGNOSIS — I214 Non-ST elevation (NSTEMI) myocardial infarction: Secondary | ICD-10-CM | POA: Diagnosis not present

## 2023-08-04 DIAGNOSIS — G9341 Metabolic encephalopathy: Secondary | ICD-10-CM | POA: Diagnosis not present

## 2023-08-04 DIAGNOSIS — R569 Unspecified convulsions: Secondary | ICD-10-CM | POA: Diagnosis not present

## 2023-08-04 DIAGNOSIS — E440 Moderate protein-calorie malnutrition: Secondary | ICD-10-CM

## 2023-08-04 LAB — CBC
HCT: 34.7 % — ABNORMAL LOW (ref 36.0–46.0)
Hemoglobin: 10.8 g/dL — ABNORMAL LOW (ref 12.0–15.0)
MCH: 27.1 pg (ref 26.0–34.0)
MCHC: 31.1 g/dL (ref 30.0–36.0)
MCV: 87.2 fL (ref 80.0–100.0)
Platelets: 151 10*3/uL (ref 150–400)
RBC: 3.98 MIL/uL (ref 3.87–5.11)
RDW: 19.4 % — ABNORMAL HIGH (ref 11.5–15.5)
WBC: 7.5 10*3/uL (ref 4.0–10.5)
nRBC: 0 % (ref 0.0–0.2)

## 2023-08-04 LAB — BASIC METABOLIC PANEL WITH GFR
Anion gap: 20 — ABNORMAL HIGH (ref 5–15)
BUN: 37 mg/dL — ABNORMAL HIGH (ref 8–23)
CO2: 13 mmol/L — ABNORMAL LOW (ref 22–32)
Calcium: 7.5 mg/dL — ABNORMAL LOW (ref 8.9–10.3)
Chloride: 119 mmol/L — ABNORMAL HIGH (ref 98–111)
Creatinine, Ser: 1.64 mg/dL — ABNORMAL HIGH (ref 0.44–1.00)
GFR, Estimated: 35 mL/min — ABNORMAL LOW (ref >60)
Glucose, Bld: 138 mg/dL — ABNORMAL HIGH (ref 70–99)
Potassium: 3.6 mmol/L (ref 3.5–5.1)
Sodium: 152 mmol/L — ABNORMAL HIGH (ref 135–145)

## 2023-08-04 LAB — VITAMIN B12: Vitamin B-12: 655 pg/mL (ref 180–914)

## 2023-08-04 LAB — PHOSPHORUS
Phosphorus: 2.4 mg/dL — ABNORMAL LOW (ref 2.5–4.6)
Phosphorus: 2.5 mg/dL (ref 2.5–4.6)

## 2023-08-04 LAB — AMMONIA: Ammonia: 254 umol/L — ABNORMAL HIGH (ref 9–35)

## 2023-08-04 LAB — GLUCOSE, CAPILLARY
Glucose-Capillary: 141 mg/dL — ABNORMAL HIGH (ref 70–99)
Glucose-Capillary: 146 mg/dL — ABNORMAL HIGH (ref 70–99)
Glucose-Capillary: 153 mg/dL — ABNORMAL HIGH (ref 70–99)
Glucose-Capillary: 202 mg/dL — ABNORMAL HIGH (ref 70–99)
Glucose-Capillary: 207 mg/dL — ABNORMAL HIGH (ref 70–99)
Glucose-Capillary: 213 mg/dL — ABNORMAL HIGH (ref 70–99)
Glucose-Capillary: 90 mg/dL (ref 70–99)

## 2023-08-04 LAB — VITAMIN D 25 HYDROXY (VIT D DEFICIENCY, FRACTURES): Vit D, 25-Hydroxy: 82.63 ng/mL (ref 30–100)

## 2023-08-04 LAB — LACTIC ACID, PLASMA
Lactic Acid, Venous: 1.7 mmol/L (ref 0.5–1.9)
Lactic Acid, Venous: 1.9 mmol/L (ref 0.5–1.9)

## 2023-08-04 LAB — MAGNESIUM
Magnesium: 2.3 mg/dL (ref 1.7–2.4)
Magnesium: 2.4 mg/dL (ref 1.7–2.4)

## 2023-08-04 LAB — C-REACTIVE PROTEIN: CRP: 3.1 mg/dL — ABNORMAL HIGH (ref <1.0)

## 2023-08-04 LAB — FOLATE: Folate: 3.8 ng/mL — ABNORMAL LOW (ref >5.9)

## 2023-08-04 MED ORDER — SODIUM BICARBONATE 650 MG PO TABS
1300.0000 mg | ORAL_TABLET | Freq: Two times a day (BID) | ORAL | Status: DC
  Administered 2023-08-04 (×2): 1300 mg via ORAL
  Filled 2023-08-04 (×2): qty 2

## 2023-08-04 MED ORDER — SODIUM CHLORIDE 0.9 % IV SOLN
15.0000 mg/kg | INTRAVENOUS | Status: DC
  Administered 2023-08-04 – 2023-08-11 (×38): 900 mg via INTRAVENOUS
  Filled 2023-08-04 (×45): qty 4.5

## 2023-08-04 MED ORDER — LACTULOSE 10 GM/15ML PO SOLN
30.0000 g | Freq: Four times a day (QID) | ORAL | Status: DC
  Administered 2023-08-04 – 2023-08-05 (×3): 30 g
  Filled 2023-08-04 (×3): qty 45

## 2023-08-04 MED ORDER — THIAMINE HCL 100 MG/ML IJ SOLN
500.0000 mg | Freq: Every day | INTRAVENOUS | Status: DC
  Administered 2023-08-04: 500 mg via INTRAVENOUS
  Filled 2023-08-04: qty 5

## 2023-08-04 MED ORDER — FUROSEMIDE 10 MG/ML IJ SOLN
60.0000 mg | Freq: Three times a day (TID) | INTRAMUSCULAR | Status: AC
  Administered 2023-08-04 (×2): 60 mg via INTRAVENOUS
  Filled 2023-08-04 (×2): qty 6

## 2023-08-04 MED ORDER — FREE WATER
200.0000 mL | Status: DC
  Administered 2023-08-04 – 2023-08-05 (×11): 200 mL

## 2023-08-04 MED ORDER — POTASSIUM CHLORIDE 20 MEQ PO PACK
40.0000 meq | PACK | Freq: Once | ORAL | Status: AC
  Administered 2023-08-04: 40 meq
  Filled 2023-08-04: qty 2

## 2023-08-04 MED ORDER — ORAL CARE MOUTH RINSE
15.0000 mL | Freq: Four times a day (QID) | OROMUCOSAL | Status: DC
  Administered 2023-08-04 – 2023-08-07 (×9): 15 mL via OROMUCOSAL

## 2023-08-04 MED ORDER — LACTULOSE 10 GM/15ML PO SOLN
30.0000 g | Freq: Every day | ORAL | Status: DC
  Administered 2023-08-04: 30 g
  Filled 2023-08-04: qty 45

## 2023-08-04 MED ORDER — OSMOLITE 1.2 CAL PO LIQD
1000.0000 mL | ORAL | Status: DC
  Administered 2023-08-04 – 2023-08-06 (×4): 1000 mL
  Filled 2023-08-04 (×4): qty 1000

## 2023-08-04 MED ORDER — SODIUM CHLORIDE 0.9 % IV SOLN
100.0000 mg/kg | Freq: Once | INTRAVENOUS | Status: AC
  Administered 2023-08-04: 5940 mg via INTRAVENOUS
  Filled 2023-08-04: qty 29.7

## 2023-08-04 MED ORDER — POTASSIUM & SODIUM PHOSPHATES 280-160-250 MG PO PACK
2.0000 | PACK | Freq: Once | ORAL | Status: AC
  Administered 2023-08-04: 2
  Filled 2023-08-04: qty 1

## 2023-08-04 MED ORDER — ADULT MULTIVITAMIN W/MINERALS CH
1.0000 | ORAL_TABLET | Freq: Two times a day (BID) | ORAL | Status: DC
  Administered 2023-08-04 – 2023-08-11 (×15): 1
  Filled 2023-08-04 (×15): qty 1

## 2023-08-04 NOTE — Progress Notes (Signed)
 [facility] ADULT ICU REPLACEMENT PROTOCOL   The patient does apply for the [facility] Adult ICU Electrolyte Replacment Protocol based on the criteria listed below:   1.Exclusion criteria: TCTS, ECMO, Dialysis, and Myasthenia Gravis patients 2. Is GFR >/= 30 ml/min? Yes.    Patient's GFR today is 35 3. Is SCr </= 2? Yes.   Patient's SCr is 1.64 mg/dL 4. Did SCr increase >/= 0.5 in 24 hours? No. 5.Pt's weight >40kg  Yes.   6. Abnormal electrolyte(s): K  7. Electrolytes replaced per protocol 8.  Call MD STAT for K+ </= 2.5, Phos </= 1, or Mag </= 1 Physician:  Allegra Arch Kathrynn Backstrom 08/04/2023 3:46 AM

## 2023-08-04 NOTE — Plan of Care (Signed)
  Problem: Clinical Measurements: Goal: Will remain free from infection Outcome: Progressing Goal: Diagnostic test results will improve Outcome: Progressing Goal: Respiratory complications will improve Outcome: Progressing   Problem: Nutrition: Goal: Adequate nutrition will be maintained Outcome: Progressing   Problem: Elimination: Goal: Will not experience complications related to bowel motility Outcome: Progressing Goal: Will not experience complications related to urinary retention Outcome: Progressing

## 2023-08-04 NOTE — Progress Notes (Addendum)
 NAME:  Sarah Davis, MRN:  [id number], DOB:  [birth year], LOS: 5 ADMISSION DATE:  07/30/2023, CONSULTATION DATE: 08/01/2023 REFERRING MD: Cardiology, CHIEF COMPLAINT: Acute respiratory distress  History of Present Illness:  63 year old female who underwent cardiac catheterization 07/31/2023 without acute interventions required.  She does have a history of polysubstance abuse, tobacco abuse, bipolar, thyroid disease and at the time of pulmonary critical care arriving at her bedside she was in acute respiratory failure and required intubation emergently.  She transferred to the intensive care unit.  She is currently on Levophed drip.  Chest x-ray, ABG, CT of the head have been ordered at this time.  Pertinent  Medical History   Past Medical History:  Diagnosis Date   Bipolar 2 disorder (HCC)    CKD (chronic kidney disease), stage III (HCC)    COPD (chronic obstructive pulmonary disease) (HCC)    Coronary artery disease    Hypothyroid    Neuropathy    Stroke (HCC)    Tobacco abuse    Significant Hospital Events: Including procedures, antibiotic start and stop dates in addition to other pertinent events   6/12 - CT negative for LVO, MRI negative. No seizures on EEG.  6/14 remains somnolent.   Interim History / Subjective:  Remains off propofol.  Objective    Blood pressure 116/72, pulse 87, temperature (!) 96.2 F (35.7 C), temperature source Axillary, resp. rate (!) 21, height 5' 5 (1.651 m), weight 59.4 kg, SpO2 100%.    Vent Mode: PSV;CPAP FiO2 (%):  [30 %-40 %] 30 % Set Rate:  [18 bmp] 18 bmp Vt Set:  [450 mL] 450 mL PEEP:  [5 cmH20] 5 cmH20 Pressure Support:  [5 cmH20] 5 cmH20 Plateau Pressure:  [11 cmH20-15 cmH20] 11 cmH20   Intake/Output Summary (Last 24 hours) at 08/04/2023 0734 Last data filed at 08/04/2023 0600 Gross per 24 hour  Intake 4069.71 ml  Output 2850 ml  Net 1219.71 ml   Filed Weights   07/30/23 2041 08/01/23 0923 08/04/23 0440  Weight: 55.1 kg 57.8 kg 59.4  kg    Examination: General: critically ill appearing woman lying in bed in NAD HENT: [place]/AT dysconjugate gaze  Lungs: breathing comfortably on PS/CPAP, minimal rhonchi, no wheezing Cardiovascular: S1S2, RRR Abdomen: soft, NT, liquid stool in flexi  Extremities:  +edema, no cyanosis Neuro: not responding to painful stimulation. Dysconjugate gaze with left eye-- at one point looking down, later was looking medially.   Na+  152 Bicarb 13, AG 20 BUN 37 Cr 1.64 WBC 7.5 H/H 10.8/34.7 Platelets 151  MRI brain: chronic changes- remote cortical infarcts in L frontal and parietal lobes, chronic microvascular changes. No acute findings.    Ancillary Tests  Ammonia 49 Mild hypernatremia 147 Creatinine improving.   Assessment and Plan   Acute metabolic encephalopathy, presumed related to hyperammonemia, although cause of latter unclear. Brain MRI without acute findings. 6/13 EEG showed no seizures.  Hypernatremia could be contributing, but is unlikely to fully explain her findings.  TSH WNL.  -con't to hold sedation -repeat EEG since she has had dysconjugate gaze  -repeat ammonia level -con't rifaximin  Increased ammonia level, no evidence of cirrhosis on imaging or by LFT's. Not on medications susceptible to cause elevated ammonia.  Copious stool output with lactulose . -con't rifaxmin -repeat ammonia level -decrease lactulose  to once daily  Acute respiratory failure with hypoxia requiring MV -LTVV -VAP prevention protocol -PAD protocol for sedation -daily SAT & SBT as approropriate  AGMA; bicarb dropped again after infusion  stopped -check LA -oral bicarb repletion  Chronic kidney disease (CKD) stage G3b/A2, moderately decreased glomerular filtration rate (GFR) between 30-44 mL/min/1.73 square meter and albuminuria creatinine ratio between 30-299 mg/g (HCC) Hypervolemia -lasix -renally dose meds, avoid nephrotoxic meds  Hypernatremia -FWF increased  -stop saline  gtt  NSTEMI (non-ST elevated myocardial infarction) (HCC) -Likely demand related based on absence of new lesions.  -supportive care -aspirin , statin -monitor on tele   Best Practice (right click and Reselect all SmartList Selections daily)   Diet/type: NPO - tube feeds.  DVT prophylaxis prophylactic heparin   Pressure ulcer(s): N/A- defer to RN assessment GI prophylaxis: H2B Lines: N/A Foley:  N/A Code Status:  full code Last date of multidisciplinary goals of care discussion [tbd]   This patient is critically ill with multiple organ system failure which requires frequent high complexity decision making, assessment, support, evaluation, and titration of therapies. This was completed through the application of advanced monitoring technologies and extensive interpretation of multiple databases. During this encounter critical care time was devoted to patient care services described in this note for 38 minutes.  Joesph Mussel, DO 08/04/23 8:20 AM [facility]  For contact information, see Amion. If no response to pager, please call PCCM consult pager. After hours, 7PM- 7AM, please call Elink.

## 2023-08-04 NOTE — Plan of Care (Signed)
  Problem: Education: Goal: Knowledge of General Education information will improve Description: Including pain rating scale, medication(s)/side effects and non-pharmacologic comfort measures Outcome: Progressing   Problem: Health Behavior/Discharge Planning: Goal: Ability to manage health-related needs will improve Outcome: Progressing   Problem: Clinical Measurements: Goal: Ability to maintain clinical measurements within normal limits will improve Outcome: Progressing Goal: Will remain free from infection Outcome: Progressing Goal: Diagnostic test results will improve Outcome: Progressing Goal: Respiratory complications will improve Outcome: Progressing Goal: Cardiovascular complication will be avoided Outcome: Progressing   Problem: Activity: Goal: Risk for activity intolerance will decrease Outcome: Progressing   Problem: Nutrition: Goal: Adequate nutrition will be maintained Outcome: Progressing   Problem: Coping: Goal: Level of anxiety will decrease Outcome: Progressing   Problem: Elimination: Goal: Will not experience complications related to bowel motility Outcome: Progressing Goal: Will not experience complications related to urinary retention Outcome: Progressing   Problem: Pain Managment: Goal: General experience of comfort will improve and/or be controlled Outcome: Progressing   Problem: Safety: Goal: Ability to remain free from injury will improve Outcome: Progressing   Problem: Skin Integrity: Goal: Risk for impaired skin integrity will decrease Outcome: Progressing   Problem: Cardiac: Goal: Ability to achieve and maintain adequate cardiovascular perfusion will improve Outcome: Progressing   Problem: Education: Goal: Individualized Educational Video(s) Outcome: Progressing   Problem: Coping: Goal: Ability to adjust to condition or change in health will improve Outcome: Progressing   Problem: Fluid Volume: Goal: Ability to maintain a  balanced intake and output will improve Outcome: Progressing   Problem: Health Behavior/Discharge Planning: Goal: Ability to identify and utilize available resources and services will improve Outcome: Progressing Goal: Ability to manage health-related needs will improve Outcome: Progressing   Problem: Metabolic: Goal: Ability to maintain appropriate glucose levels will improve Outcome: Progressing   Problem: Nutritional: Goal: Maintenance of adequate nutrition will improve Outcome: Progressing Goal: Progress toward achieving an optimal weight will improve Outcome: Progressing   Problem: Skin Integrity: Goal: Risk for impaired skin integrity will decrease Outcome: Progressing   Problem: Tissue Perfusion: Goal: Adequacy of tissue perfusion will improve Outcome: Progressing

## 2023-08-04 NOTE — Progress Notes (Addendum)
 eLink Physician-Brief Progress Note Patient Name: Sarah Davis DOB: 22-Jun-1960 MRN: [id number]   Date of Service  08/04/2023  HPI/Events of Note  Has ongoing encephalopathy, looking to avoid sedation  Appears uncomfortable with current ventilator mode.  Has some dyssynchrony but breathing spontaneously and pulling volumes up to 700 cc  eICU Interventions  Switch to spontaneous ventilation, continue to avoid sedation for now   0453 - Apply warming blanket  Intervention Category Intermediate Interventions: Respiratory distress - evaluation and management  Bevely Hackbart 08/04/2023, 1:05 AM

## 2023-08-04 NOTE — Progress Notes (Signed)
 Rounding Note   Patient Name: Sarah Davis Date of Encounter: 08/04/2023  [facility] Cardiologist: None   Subjective Renal function stable at 1.64, hypernatremia worsened at 152.  Remains intubated.  Not following commands  Scheduled Meds:  aspirin   81 mg Per Tube Daily   atorvastatin   80 mg Per Tube Daily   Chlorhexidine Gluconate Cloth  6 each Topical Daily   docusate  100 mg Per Tube BID   famotidine  20 mg Per Tube Daily   feeding supplement (PROSource TF20)  60 mL Per Tube Daily   feeding supplement (VITAL HIGH PROTEIN)  1,000 mL Per Tube Q24H   free water  200 mL Per Tube Q2H   furosemide  60 mg Intravenous Q8H   heparin  injection (subcutaneous)  5,000 Units Subcutaneous Q8H   insulin  aspart  1-3 Units Subcutaneous Q4H   lactulose   30 g Per Tube Daily   levothyroxine   100 mcg Per Tube q morning   mouth rinse  15 mL Mouth Rinse Q2H   polyethylene glycol  17 g Per Tube Daily   rifaximin  550 mg Per Tube BID   sodium bicarbonate  1,300 mg Oral BID   sodium chloride  flush  3 mL Intravenous Q12H   Continuous Infusions:  cefTRIAXone (ROCEPHIN)  IV Stopped (08/03/23 1725)   propofol (DIPRIVAN) infusion Stopped (08/03/23 0701)   sodium chloride      PRN Meds: acetaminophen , albuterol, nitroGLYCERIN , ondansetron  (ZOFRAN ) IV, mouth rinse, sodium chloride  flush   Vital Signs  Vitals:   08/04/23 0530 08/04/23 0600 08/04/23 0603 08/04/23 0755  BP: 113/74 116/72    Pulse: 85 87    Resp: 19 (!) 21    Temp:   (!) 96.2 F (35.7 C) 98.1 F (36.7 C)  TempSrc:   Axillary Oral  SpO2: 100% 100%    Weight:      Height:        Intake/Output Summary (Last 24 hours) at 08/04/2023 0904 Last data filed at 08/04/2023 0600 Gross per 24 hour  Intake 3994.65 ml  Output 2350 ml  Net 1644.65 ml      08/04/2023    4:40 AM 08/01/2023    9:23 AM 07/30/2023    8:41 PM  Last 3 Weights  Weight (lbs) 130 lb 15.3 oz 127 lb 6.8 oz 121 lb 6.4 oz  Weight (kg) 59.4 kg 57.8 kg 55.067  kg      Telemetry NSR - Personally Reviewed  ECG  No new ECG - Personally Reviewed  Physical Exam  GEN: Intubated Neck: No JVD Cardiac: RRR, no murmurs, rubs, or gallops.  Respiratory: Mechanical breath sounds GI: Soft MS: No edema Neuro:  not following commands Psych: Unable  to assess  Labs High Sensitivity Troponin:   Recent Labs  Lab 07/30/23 2017  TROPONINIHS 1,235*     Chemistry Recent Labs  Lab 07/31/23 1746 08/01/23 0516 08/01/23 1039 08/02/23 0227 08/03/23 0414 08/03/23 1426 08/03/23 1758 08/04/23 0220  NA 140   < > 143 145 147*  --   --  152*  K 4.8   < > 4.9 3.0* 3.6  --   --  3.6  CL 107   < > 109 110 114*  --   --  119*  CO2 19*   < > 15* 16* 19*  --   --  13*  GLUCOSE 217*   < > 171* 94 82  --   --  138*  BUN 23   < >  34* 35* 32*  --   --  37*  CREATININE 1.67*   < > 1.93* 1.82* 1.57*  --   --  1.64*  CALCIUM  8.2*   < > 7.7* 7.4* 6.9*  --   --  7.5*  MG  --    < > 2.1  --  2.1 2.2 2.0 2.3  PROT 4.9*  --  4.9*  --   --   --   --   --   ALBUMIN 2.4*  --  2.5*  --   --   --   --   --   AST 21  --  29  --   --   --   --   --   ALT 11  --  15  --   --   --   --   --   ALKPHOS 75  --  79  --   --   --   --   --   BILITOT 0.5  --  0.6  --   --   --   --   --   GFRNONAA 34*   < > 29* 31* 37*  --   --  35*  ANIONGAP 14   < > 19* 19* 14  --   --  20*   < > = values in this interval not displayed.    Lipids  Recent Labs  Lab 08/01/23 0516 08/02/23 0227  CHOL 125  --   TRIG 237* 150*  HDL 31*  --   LDLCALC 47  --   CHOLHDL 4.0  --     Hematology Recent Labs  Lab 08/02/23 0227 08/03/23 0414 08/04/23 0220  WBC 15.5* 8.8 7.5  RBC 3.99 3.73* 3.98  HGB 10.7* 10.2* 10.8*  HCT 34.6* 33.0* 34.7*  MCV 86.7 88.5 87.2  MCH 26.8 27.3 27.1  MCHC 30.9 30.9 31.1  RDW 18.5* 18.9* 19.4*  PLT 232 109* 151   Thyroid  Recent Labs  Lab 08/01/23 1039  TSH 0.717    BNPNo results for input(s): BNP, PROBNP in the last 168 hours.  DDimer No  results for input(s): DDIMER in the last 168 hours.   Radiology  MR BRAIN WO CONTRAST Result Date: 08/02/2023 EXAM: MRI BRAIN WITHOUT CONTRAST 08/02/2023 10:57:40 AM TECHNIQUE: Multiplanar multisequence MRI of the head/brain was performed without the administration of intravenous contrast. COMPARISON: CT head without contrast and CT angio head and neck 08/01/2023. CLINICAL HISTORY: Mental status change, persistent or worsening. FINDINGS: BRAIN AND VENTRICLES: No acute infarct. No intracranial hemorrhage. No mass. No midline shift. No hydrocephalus. The sella is unremarkable. Normal flow voids. Scattered cortical and subcortical T2 and FLAIR hyperintensities bilaterally are moderately advanced for age. Remote cortical infarcts are present in the high posterior left frontal lobe and parietal lobe. ORBITS: No acute abnormality. SINUSES AND MASTOIDS: Small mastoid effusions are present. No obstructing nasopharyngeal lesion is present. BONES AND SOFT TISSUES: Normal marrow signal. No acute soft tissue abnormality. IMPRESSION: 1. No acute intracranial abnormality. 2. Moderately advanced scattered cortical and subcortical T2 and FLAIR hyperintensities bilaterally for age. This likely reflects the sequelae of chronic microvascular ischemia. 3. 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 Electronically signed by: Audree Leas MD 08/02/2023 11:15 AM EDT RP Workstation: [id number]    Cardiac Studies   Patient Profile   63 y.o. female with CAD, hypertension, tobacco use, hyperlipidemia, schizophrenia/bipolar disorder who was admitted with NSTEMI.  Assessment &  Plan   NSTEMI: Has history of STEMI with stenting of distal LCx, RCA in 2021 post complicated by V-fib arrest.  She presented to [facility] on 6/9 with altered mental status, found to have troponin elevation.  Echocardiogram at [facility] showed EF 35 to 40%.  She was transferred to [facility] for cath.  Cath on 6/12  showed patent LCx and RCA stents, chronic occlusion in mid LAD with collaterals, medical management recommended - Suspect demand ischemia (vs Takotsubo given wall motion abnormality). Continue aspirin , statin  Acute systolic heart failure: Echocardiogram at [facility] with EF 35 to 40%.  Echo repeated on 6/13 and showed EF 45 to 50% with apical hypokinesis - Appears euvolemic - Hypotensive following intubation, initially required levophed but has been weaned off.  Will fold in GDMT if BP remains stable  AMS: Worsening encephalopathy, required intubation for airway protection on 6/13.  Neurology consulted, no acute abnormalities on head CT.  MRI brain ordered.  Workup showed significantly elevated ammonia, suspect contributing to encephalopathy, though unclear cause as no evidence of cirrhosis on imaging.  Started on lactulose  and rifaximin per PCCM  Acute respiratory failure: Intubated, management per PCCM  AKI: Creatinine up to 1.93 on 6/13.  She has been given IV fluids, creatinine improved    For questions or updates, please contact [facility] Please consult [email or web address] for contact info under     Signed, Wendie Hamburg, MD  08/04/2023, 9:04 AM

## 2023-08-04 NOTE — Progress Notes (Addendum)
 Initial Nutrition Assessment  DOCUMENTATION CODES:  Non-severe (moderate) malnutrition in context of chronic illness  INTERVENTION:  Adjust tube feeding via OGT: Osmolite 1.2 at 50 ml/h (1200 ml per day) Start at 40 and advance by 10 q6 hours Prosource TF20 60 ml 1x/d free water flush q2h (per MD) Provides 1520 kcal, 87 gm protein, 984 ml free water daily (TF+flush = of free water) Assess the following micronutrients for possible etiology of AMS. Also noted pinpoint hemorrhaging to arms and legs and family reports worsening weakness (unable to walk dog): Vitamin C, D, E, B6, B12, thiamine, folic acid, zinc MVI BID x 14 days, then transition to 1x/d, 500mg  of IV thiamine x 5 days  NUTRITION DIAGNOSIS:  Moderate Malnutrition related to chronic illness as evidenced by severe muscle depletion, moderate fat depletion.  GOAL:  Patient will meet greater than or equal to 90% of their needs  MONITOR:  TF tolerance, Skin, I & O's, Vent status, Labs  REASON FOR ASSESSMENT:  Consult Enteral/tube feeding initiation and management  ASSESSMENT:  Pt with hx of CKD3, COPD, CAD, hx CVA, and DM type 2 presented to ED from [facility] for cardiac cath.  Pt with ongoing AMS this admission. Rapid response called 6/12 and 6/13. On 6/13 pt ultimately intubated and transferred to ICU.   Patient is currently intubated on ventilator support. Mom at bedside able to provide some nutrition hx. States that pt is very private and not always forthcoming about her medical issues. Unsure of how she usually eats but states that one of pt's friends has expressed concerns with pt's poor appetite. Mom reports that they go out to eat with pt about 1x per week and she always eats well when she is with them. Does think that she may have lost some weight over the last 6 months or so.   On exam, pt with severe muscle depletions to the legs and mild/moderate to the rest of her body. Pinpoint hemorrhaging  noted to arms and legs as well as large amounts of bruising on forearms. Unable to see gums fully due to ETT but noted bloody saliva on the lips and corners of mouth.  Mom reports that pt's energy also seems to have declined. No longer walks her dog and pays for a walker. Does not use any assistive devices at baseline.   Pt discussed during ICU rounds and with RN and MD. Ammonia levels elevated again and no clear cause for significant encephalopathy. Mom reports that pt had a similar presentation about 10 years ago and was treated at [facility]. Reviewed records from admission 05/2014. Do not see in H&P or discharge summary that there were several nutrient deficiency mentioned. It does appear that pt was given high does thiamine and folic acid for possible Wernicke's and also treated for refeeding syndrome after enteral nutrition was initiated. Will assess micronutrient labs to determine if a deficiency is playing a role in presentation. Per MD, will also provide high dose thiamine x 5 days and MVI BID.  Started on TF protocol over the weekend, adjusted formula to better meet needs.   MV: 16.8 L/min Temp (24hrs), Avg:96.6 F (35.9 C), Min:95.4 F (35.2 C), Max:98.1 F (36.7 C) MAP (cuff): 86 mmHg  Admit weight: 55.1 kg  Current weight: 59.4 kg   Intake/Output Summary (Last 24 hours) at 08/04/2023 1411 Last data filed at 08/04/2023 1212 Gross per 24 hour  Intake 3720.03 ml  Output 2910 ml  Net 810.03 ml  Net IO Since Admission: 5,187.78 mL [08/04/23 1411]  Drains/Lines: OGT 16 Fr. FMS placed 6/14, out x 24 hours UOP x 24 hours  Average Meal Intake: 6/11: 20% intake x 1 recorded meals  Nutritionally Relevant Medications: Scheduled Meds:  atorvastatin   80 mg Per Tube Daily   docusate  100 mg Per Tube BID   famotidine  20 mg Per Tube Daily   PROSource TF20  60 mL Per Tube Daily   VITAL HIGH PROTEIN  1,000 mL Per Tube Q24H   free water  200 mL Per Tube Q2H    furosemide  60 mg Intravenous Q8H   insulin  aspart  1-3 Units Subcutaneous Q4H   lactulose   30 g Per Tube Daily   levothyroxine   100 mcg Per Tube q morning   polyethylene glycol  17 g Per Tube Daily   sodium bicarbonate  1,300 mg Oral BID   Continuous Infusions:  cefTRIAXone (ROCEPHIN)  IV Stopped (08/03/23 1725)   PRN Meds: ondansetron   Labs Reviewed: Sodium 152, chloride 119 BUN 37, creatinine 1.64 Phosphorus 2.4 CBG ranges from 90-153 mg/dL over the last 24 hours HgbA1c 4.8% (6/13)  NUTRITION - FOCUSED PHYSICAL EXAM: Flowsheet Row Most Recent Value  Orbital Region Moderate depletion  Upper Arm Region No depletion  Thoracic and Lumbar Region Mild depletion  Buccal Region Moderate depletion  Temple Region Mild depletion  Clavicle Bone Region Moderate depletion  Clavicle and Acromion Bone Region Moderate depletion  Scapular Bone Region Moderate depletion  Dorsal Hand Unable to assess  [edema]  Patellar Region Severe depletion  Anterior Thigh Region Severe depletion  Posterior Calf Region Severe depletion  Edema (RD Assessment) Moderate  [hands]  Hair Reviewed  [thin]  Eyes Unable to assess  Mouth Reviewed  [blood noted on lips]  Skin Reviewed  [pin point hemorrhages]  Nails Reviewed    Diet Order:   Diet Order     None       EDUCATION NEEDS:     Skin:  Skin Assessment: Reviewed RN Assessment  Last BM:  6/16 - type 7  Height:  Ht Readings from Last 1 Encounters:  07/30/23 5' 5 (1.651 m)    Weight:  Wt Readings from Last 1 Encounters:  08/04/23 59.4 kg    Ideal Body Weight:  56.8 kg  BMI:  Body mass index is 21.79 kg/m.  Estimated Nutritional Needs:  Kcal:  1500-1700 kcal/d Protein:  75-90g/d Fluid:  >1.5L/d    Edwena Graham, RD, LDN Registered Dietitian II Please reach out via secure chat

## 2023-08-04 NOTE — Progress Notes (Signed)
 EEG diffuse encephalopathy, no seizures Ammonia back in the 200s despite rifaximin and lactulose   Sister and niece updated at bedside. They reported she had a similar admission at [facility] about ten years ago that was associated with vitamin deficiencies- not sure why. No h/o celiacs, eats ~2 meals per day and eats a robust diet.    Plan: - increasing lactulose  to q6h -aggressive vitamin repletion -RD checking several vitamin & mineral levels -MVI BID, high dose thiamine  Will need to review [facility] records to try to figure out metabolically what was going on then to help her now.   This patient is critically ill with multiple organ system failure which requires frequent high complexity decision making, assessment, support, evaluation, and titration of therapies. This was completed through the application of advanced monitoring technologies and extensive interpretation of multiple databases. During this encounter critical care time was devoted to patient care services described in this note for 20 minutes.   Joesph Mussel, DO 08/04/23 12:41 PM [facility]  For contact information, see Amion. If no response to pager, please call PCCM consult pager. After hours, 7PM- 7AM, please call Elink.

## 2023-08-04 NOTE — Progress Notes (Addendum)
   08/04/23 0111  Adult Ventilator Settings  Vent Type Servo i  Humidity HME  Vent Mode PSV;CPAP  FiO2 (%) 30 %  Pressure Support 5 cmH20  PEEP 5 cmH20  High PEEP 10 sec  Low PEEP 2 sec  Adult Ventilator Measurements  Peak Airway Pressure 11 L/min  Mean Airway Pressure 7 cmH20  Resp Rate Spontaneous 20 br/min  Resp Rate Total 20 br/min  Exhaled Vt 770 mL  Spont TV 770 mL  Measured Ve 17.1 L  Total PEEP 5 cmH20   SBT started per MD request to make patient more comfortable and synchronias with the vent. Patient tolerating well at this time. Will continue to monitor.

## 2023-08-04 NOTE — Progress Notes (Signed)
 EEG complete - results pending

## 2023-08-04 NOTE — Procedures (Signed)
 Patient Name: Sarah Davis  MRN: [id number]  Epilepsy Attending: [name]  Referring Physician/Provider: Joesph Mussel, DO  Date: 08/04/2023 Duration: 22.25 mins  Patient history: 62yo F with ams. EEG to evaluate for seizure.  Level of alertness: comatose/ lethargic   AEDs during EEG study: None during eeg ( was on propofol before EEG)  Technical aspects: This EEG study was done with scalp electrodes positioned according to the 10-20 International system of electrode placement. Electrical activity was reviewed with band pass filter of 1-70Hz , sensitivity of 7 uV/mm, display speed of 57mm/sec with a 60Hz  notched filter applied as appropriate. EEG data were recorded continuously and digitally stored.  Video monitoring was available and reviewed as appropriate.  Description: EEG showed continuous generalized low amplitude 2-3Hz  delta slowing. Hyperventilation and photic stimulation were not performed.     ABNORMALITY - Continuous slow, generalized  IMPRESSION: This study is suggestive of severe to profound diffuse encephalopathy. No seizures or epileptiform discharges were seen throughout the recording.  [employer] Belay O Toriana Sponsel

## 2023-08-05 DIAGNOSIS — I214 Non-ST elevation (NSTEMI) myocardial infarction: Secondary | ICD-10-CM | POA: Diagnosis not present

## 2023-08-05 DIAGNOSIS — I5021 Acute systolic (congestive) heart failure: Secondary | ICD-10-CM | POA: Diagnosis not present

## 2023-08-05 DIAGNOSIS — N179 Acute kidney failure, unspecified: Secondary | ICD-10-CM

## 2023-08-05 DIAGNOSIS — J9601 Acute respiratory failure with hypoxia: Secondary | ICD-10-CM | POA: Diagnosis not present

## 2023-08-05 DIAGNOSIS — G9341 Metabolic encephalopathy: Secondary | ICD-10-CM | POA: Diagnosis not present

## 2023-08-05 DIAGNOSIS — N1832 Chronic kidney disease, stage 3b: Secondary | ICD-10-CM | POA: Diagnosis not present

## 2023-08-05 LAB — MAGNESIUM: Magnesium: 2.3 mg/dL (ref 1.7–2.4)

## 2023-08-05 LAB — BASIC METABOLIC PANEL WITH GFR
Anion gap: 14 (ref 5–15)
BUN: 44 mg/dL — ABNORMAL HIGH (ref 8–23)
CO2: 17 mmol/L — ABNORMAL LOW (ref 22–32)
Calcium: 7.8 mg/dL — ABNORMAL LOW (ref 8.9–10.3)
Chloride: 126 mmol/L — ABNORMAL HIGH (ref 98–111)
Creatinine, Ser: 1.48 mg/dL — ABNORMAL HIGH (ref 0.44–1.00)
GFR, Estimated: 40 mL/min — ABNORMAL LOW (ref >60)
Glucose, Bld: 218 mg/dL — ABNORMAL HIGH (ref 70–99)
Potassium: 3.7 mmol/L (ref 3.5–5.1)
Sodium: 157 mmol/L — ABNORMAL HIGH (ref 135–145)

## 2023-08-05 LAB — COMPREHENSIVE METABOLIC PANEL WITH GFR
ALT: 15 U/L (ref 0–44)
AST: 23 U/L (ref 15–41)
Albumin: 2.4 g/dL — ABNORMAL LOW (ref 3.5–5.0)
Alkaline Phosphatase: 77 U/L (ref 38–126)
Anion gap: 15 (ref 5–15)
BUN: 46 mg/dL — ABNORMAL HIGH (ref 8–23)
CO2: 13 mmol/L — ABNORMAL LOW (ref 22–32)
Calcium: 7.4 mg/dL — ABNORMAL LOW (ref 8.9–10.3)
Chloride: 126 mmol/L — ABNORMAL HIGH (ref 98–111)
Creatinine, Ser: 1.76 mg/dL — ABNORMAL HIGH (ref 0.44–1.00)
GFR, Estimated: 32 mL/min — ABNORMAL LOW (ref >60)
Glucose, Bld: 246 mg/dL — ABNORMAL HIGH (ref 70–99)
Potassium: 2.5 mmol/L — CL (ref 3.5–5.1)
Sodium: 154 mmol/L — ABNORMAL HIGH (ref 135–145)
Total Bilirubin: 0.5 mg/dL (ref 0.0–1.2)
Total Protein: 5 g/dL — ABNORMAL LOW (ref 6.5–8.1)

## 2023-08-05 LAB — AMMONIA: Ammonia: 76 umol/L — ABNORMAL HIGH (ref 9–35)

## 2023-08-05 LAB — GLUCOSE, CAPILLARY
Glucose-Capillary: 186 mg/dL — ABNORMAL HIGH (ref 70–99)
Glucose-Capillary: 201 mg/dL — ABNORMAL HIGH (ref 70–99)
Glucose-Capillary: 202 mg/dL — ABNORMAL HIGH (ref 70–99)
Glucose-Capillary: 250 mg/dL — ABNORMAL HIGH (ref 70–99)
Glucose-Capillary: 276 mg/dL — ABNORMAL HIGH (ref 70–99)
Glucose-Capillary: 280 mg/dL — ABNORMAL HIGH (ref 70–99)

## 2023-08-05 LAB — CBC
HCT: 37.6 % (ref 36.0–46.0)
Hemoglobin: 11.6 g/dL — ABNORMAL LOW (ref 12.0–15.0)
MCH: 27.2 pg (ref 26.0–34.0)
MCHC: 30.9 g/dL (ref 30.0–36.0)
MCV: 88.3 fL (ref 80.0–100.0)
Platelets: 105 10*3/uL — ABNORMAL LOW (ref 150–400)
RBC: 4.26 MIL/uL (ref 3.87–5.11)
RDW: 19.8 % — ABNORMAL HIGH (ref 11.5–15.5)
WBC: 12.1 10*3/uL — ABNORMAL HIGH (ref 4.0–10.5)
nRBC: 0 % (ref 0.0–0.2)

## 2023-08-05 LAB — TRIGLYCERIDES: Triglycerides: 166 mg/dL — ABNORMAL HIGH (ref <150)

## 2023-08-05 MED ORDER — POTASSIUM CHLORIDE 10 MEQ/100ML IV SOLN
10.0000 meq | INTRAVENOUS | Status: AC
  Administered 2023-08-05 (×4): 10 meq via INTRAVENOUS
  Filled 2023-08-05 (×4): qty 100

## 2023-08-05 MED ORDER — POTASSIUM CHLORIDE 20 MEQ PO PACK
40.0000 meq | PACK | Freq: Once | ORAL | Status: AC
  Administered 2023-08-05: 40 meq
  Filled 2023-08-05: qty 2

## 2023-08-05 MED ORDER — LACTULOSE 10 GM/15ML PO SOLN
30.0000 g | Freq: Three times a day (TID) | ORAL | Status: DC
  Administered 2023-08-05 – 2023-08-06 (×3): 30 g
  Filled 2023-08-05 (×3): qty 45

## 2023-08-05 MED ORDER — FOLIC ACID 1 MG PO TABS
1.0000 mg | ORAL_TABLET | Freq: Every day | ORAL | Status: DC
  Administered 2023-08-05 – 2023-08-11 (×7): 1 mg
  Filled 2023-08-05 (×7): qty 1

## 2023-08-05 MED ORDER — LACTATED RINGERS IV SOLN: INTRAVENOUS | Status: DC

## 2023-08-05 MED ORDER — FREE WATER
300.0000 mL | Status: DC
  Administered 2023-08-05 – 2023-08-07 (×25): 300 mL

## 2023-08-05 MED ORDER — INSULIN ASPART 100 UNIT/ML IJ SOLN
0.0000 [IU] | INTRAMUSCULAR | Status: DC
  Administered 2023-08-05: 2 [IU] via SUBCUTANEOUS
  Administered 2023-08-05: 3 [IU] via SUBCUTANEOUS
  Administered 2023-08-05: 5 [IU] via SUBCUTANEOUS
  Administered 2023-08-05: 3 [IU] via SUBCUTANEOUS
  Administered 2023-08-05: 5 [IU] via SUBCUTANEOUS
  Administered 2023-08-06 (×2): 3 [IU] via SUBCUTANEOUS
  Administered 2023-08-06: 5 [IU] via SUBCUTANEOUS
  Administered 2023-08-06: 7 [IU] via SUBCUTANEOUS
  Administered 2023-08-06: 5 [IU] via SUBCUTANEOUS
  Administered 2023-08-06: 9 [IU] via SUBCUTANEOUS
  Administered 2023-08-07: 5 [IU] via SUBCUTANEOUS

## 2023-08-05 MED ORDER — ORAL CARE MOUTH RINSE
15.0000 mL | OROMUCOSAL | Status: DC | PRN
Start: 2023-08-05 — End: 2023-09-01

## 2023-08-05 MED ORDER — SODIUM BICARBONATE 650 MG PO TABS
1300.0000 mg | ORAL_TABLET | Freq: Four times a day (QID) | ORAL | Status: DC
  Administered 2023-08-05 – 2023-08-09 (×17): 1300 mg via ORAL
  Filled 2023-08-05 (×17): qty 2

## 2023-08-05 MED ORDER — POTASSIUM CHLORIDE 20 MEQ PO PACK
60.0000 meq | PACK | Freq: Once | ORAL | Status: AC
  Administered 2023-08-05: 60 meq
  Filled 2023-08-05: qty 3

## 2023-08-05 MED ORDER — ORAL CARE MOUTH RINSE
15.0000 mL | OROMUCOSAL | Status: DC
  Administered 2023-08-05 – 2023-08-08 (×36): 15 mL via OROMUCOSAL

## 2023-08-05 MED ORDER — DEXTROSE 5 % IV SOLN: INTRAVENOUS | Status: DC

## 2023-08-05 NOTE — Progress Notes (Signed)
 I called her sister [name] to provide an update. All questions answered.  Joesph Mussel, DO 08/05/23 3:04 PM [facility]

## 2023-08-05 NOTE — Progress Notes (Signed)
 Patient placed back on full support due to accessory muscle use and work of breathing.

## 2023-08-05 NOTE — Progress Notes (Addendum)
 NAME:  Sarah Davis, MRN:  [id number], DOB:  23-Mar-1960, LOS: 6 ADMISSION DATE:  07/30/2023, CONSULTATION DATE: 08/01/2023 REFERRING MD: Cardiology, CHIEF COMPLAINT: Acute respiratory distress  History of Present Illness:  63 year old female who underwent cardiac catheterization 07/31/2023 without acute interventions required.  She does have a history of polysubstance abuse, tobacco abuse, bipolar, thyroid disease and at the time of pulmonary critical care arriving at her bedside she was in acute respiratory failure and required intubation emergently.  She transferred to the intensive care unit.  She is currently on Levophed drip.  Chest x-ray, ABG, CT of the head have been ordered at this time.  Pertinent  Medical History   Past Medical History:  Diagnosis Date   Anxiety    Bipolar 2 disorder (HCC)    Chronic hepatitis C virus genotype 1 infection (HCC)    Chronic pancreatitis (HCC)    CKD (chronic kidney disease) stage 4, GFR 15-29 ml/min (HCC)    COPD (chronic obstructive pulmonary disease) (HCC)    Coronary artery disease 11/2019   s/p PTCI  LCx & RCA. Complicated by Vfib arrest   DM (diabetes mellitus), type 2 (HCC)    HFrEF (heart failure with reduced ejection fraction) (HCC) 08/01/2023   EF 45-50%   Hyperparathyroidism, secondary (HCC)    Hypothyroid    Lithium toxicity    Marijuana abuse    NSTEMI (non-ST elevated myocardial infarction) (HCC) 07/30/2023   med management   Peripheral autonomic neuropathy due to DM [facility])    STEMI (ST elevation myocardial infarction) (HCC) 2021   Stroke [facility])    right hemiparesis   Substance abuse (HCC)    Tardive dyskinesia    Tobacco abuse    Significant Hospital Events: Including procedures, antibiotic start and stop dates in addition to other pertinent events   6/12 - CT negative for LVO, MRI negative. No seizures on EEG.  6/14 remains somnolent.  6/16 unresponsive, ammonia back in 250 range  Interim History / Subjective:  Remains off  sedation.  Objective    Blood pressure 133/82, pulse 100, temperature 97.7 F (36.5 C), temperature source Axillary, resp. rate (!) 26, height 5' 5 (1.651 m), weight 57 kg, SpO2 98%.    Vent Mode: PRVC FiO2 (%):  [30 %] 30 % Set Rate:  [15 bmp-18 bmp] 18 bmp Vt Set:  [450 mL] 450 mL PEEP:  [5 cmH20] 5 cmH20 Pressure Support:  [5 cmH20] 5 cmH20 Plateau Pressure:  [8 cmH20-13 cmH20] 8 cmH20   Intake/Output Summary (Last 24 hours) at 08/05/2023 0713 Last data filed at 08/05/2023 0700 Gross per 24 hour  Intake 3899.53 ml  Output 5180 ml  Net -1280.47 ml   Filed Weights   08/01/23 0923 08/04/23 0440 08/05/23 0500  Weight: 57.8 kg 59.4 kg 57 kg    Examination: General: critically ill appearing woman lying in bed in NAD HENT: [place]/AT, eyes anicteric, conjugate gaze today. ETT.  Lungs: breathing comfortably on MV, moderate secretions-- clear Cardiovascular: S1S2, RRR Abdomen: soft, NT  Extremities:  no cyanosis, +edema Neuro: RASS 0, not tracking or responding to verbal commands. +cough. Conjugate gaze today.    Na+  154 K+ 2.5 Bicarb 13, AG 15 BUN 46 Cr 1.76 WBC 12.1 H/H 11.6/337.6 Platelets 105   Resolved problem list:    Assessment and Plan   Acute metabolic encephalopathy, presumed related to hyperammonemia, although cause of latter unclear. Brain MRI without acute findings. 6/13 EEG showed no seizures, same on 6/16.  Hypernatremia could be  contributing, but is unlikely to fully explain her findings.  TSH WNL.  -con't holding sedation -daily ammonia levels; has carnitine and vitamin levels pending -con't rifaximin & lactulose   Increased ammonia level, no evidence of cirrhosis on imaging or by LFT's. Not on medications susceptible to cause elevated ammonia. Question carnitine deficiency? Trying to determine if another vitamin deficiency can cause this.  Copious stool output with lactulose , but ammonia responding -con't rifaxmin BID -daily ammonia level -con't  carnitine; checking level from before supplementation -lactulose  3 times today to get level back to a more normal level -Reviewed [facility] records-- had severe protein energy malnutrition with refeeding syndrome during that admission, was hospitalized 7 days. Had negative LP, EEGs, had DI that was treated with DDAVP. Ammonia was never checked during that admission.   Hypokalemia -replete IV & oral  -recheck this afternoon  Acute respiratory failure with hypoxia requiring MV -LTVV -VAP prevention protocol -PAD  protocol for sedation -daily SAT & SBT; off all sedation  AGMA; bicarb dropped again after infusion stopped- potentially due to diarrhea. LA normal x 2 on 6/16. -oral bicarb repletion increased to q6h   AKI on chronic kidney disease (CKD) stage G3b/A2, moderately decreased glomerular filtration rate (GFR) between 30-44 mL/min/1.73 square meter and albuminuria creatinine ratio between 30-299 mg/g (HCC) Hypervolemia -hold lasix today -renally dose meds, avoid nephrotoxic meds  Hypernatremia -FWF increased -con't to monitor  CAD with NSTEMI (non-ST elevated myocardial infarction) ; LHC demonstrated chronic occlusion Acute HFrEF> concern for stress induced CM -Likely demand related based on absence of new lesions.  -con't supportive care -defer GDMT currently with AKI -aspirin , statin -monitor on tele  Hypothyroidism -synthroid    Best Practice (right click and Reselect all SmartList Selections daily)   Diet/type: NPO - tube feeds.  DVT prophylaxis prophylactic heparin   Pressure ulcer(s): N/A- defer to RN assessment GI prophylaxis: H2B Lines: N/A Foley:  N/A Code Status:  full code Last date of multidisciplinary goals of care discussion [sister and niece updated 6/16]   This patient is critically ill with multiple organ system failure which requires frequent high complexity decision making, assessment, support, evaluation, and titration of therapies. This was  completed through the application of advanced monitoring technologies and extensive interpretation of multiple databases. During this encounter critical care time was devoted to patient care services described in this note for 41 minutes.  Joesph Mussel, DO 08/05/23 8:54 AM [facility]  For contact information, see Amion. If no response to pager, please call PCCM consult pager. After hours, 7PM- 7AM, please call Elink.

## 2023-08-05 NOTE — Plan of Care (Signed)

## 2023-08-05 NOTE — Progress Notes (Signed)
 Brief Nutrition Note   Micronutrient labs drawn 6/16. The following have resulted. Will continue to monitor for the remainder of results.   Micronutrient Profile CRP 3.1, inflammation not high enough to skew labs. Results reported will be true serum levels Vitamin C - pending Vitamin D - 82.63 (WNL) Vitamin E- pending Vitamin B6 - pending Vitamin B12 - 655 (WNL) Thiamine- pending Folate - 3.8 (low), 1mg  daily supplementation added Zinc- pending Carnitine - pending    Edwena Graham, RD, LDN, CNSC Registered Dietitian II Please reach out via secure chat

## 2023-08-05 NOTE — Plan of Care (Signed)
   Problem: Education: Goal: Knowledge of General Education information will improve Description: Including pain rating scale, medication(s)/side effects and non-pharmacologic comfort measures Outcome: Not Progressing

## 2023-08-05 NOTE — Progress Notes (Signed)
 Rounding Note   Patient Name: Sarah Davis Date of Encounter: 08/05/2023  [facility] Cardiologist: None   Subjective Worsening sodium to 154, potassium 2.5, creatinine 1.64 > 1.76.  She was net -1.3 L yesterday.  BP 135/84.  Opening her eyes, not following commands   Scheduled Meds:  aspirin   81 mg Per Tube Daily   atorvastatin   80 mg Per Tube Daily   Chlorhexidine Gluconate Cloth  6 each Topical Daily   docusate  100 mg Per Tube BID   famotidine  20 mg Per Tube Daily   feeding supplement (PROSource TF20)  60 mL Per Tube Daily   free water  300 mL Per Tube Q2H   heparin  injection (subcutaneous)  5,000 Units Subcutaneous Q8H   insulin  aspart  0-9 Units Subcutaneous Q4H   lactulose   30 g Per Tube Q6H   levothyroxine   100 mcg Per Tube q morning   multivitamin with minerals  1 tablet Per Tube BID   mouth rinse  15 mL Mouth Rinse QID   polyethylene glycol  17 g Per Tube Daily   rifaximin  550 mg Per Tube BID   sodium bicarbonate  1,300 mg Oral Q6H   Continuous Infusions:  cefTRIAXone (ROCEPHIN)  IV Stopped (08/04/23 1648)   feeding supplement (OSMOLITE 1.2 CAL) 50 mL/hr at 08/05/23 0700   levOCARNitine (CARNITOR) 900 mg in sodium chloride  0.9 % 250 mL (3.6 mg/mL) IVPB 900 mg (08/05/23 0733)   potassium chloride  10 mEq (08/05/23 0839)   propofol (DIPRIVAN) infusion Stopped (08/03/23 0701)   PRN Meds: acetaminophen , albuterol, nitroGLYCERIN , ondansetron  (ZOFRAN ) IV, mouth rinse, sodium chloride  flush   Vital Signs  Vitals:   08/05/23 0810 08/05/23 0819 08/05/23 0820 08/05/23 0830  BP: 135/84     Pulse: 96  94 94  Resp: (!) 25  (!) 25 (!) 25  Temp:  (!) 96.4 F (35.8 C)    TempSrc:  Axillary    SpO2: 98%  98% 98%  Weight:      Height:        Intake/Output Summary (Last 24 hours) at 08/05/2023 0848 Last data filed at 08/05/2023 0700 Gross per 24 hour  Intake 3899.53 ml  Output 5180 ml  Net -1280.47 ml      08/05/2023    5:00 AM 08/04/2023    4:40 AM  08/01/2023    9:23 AM  Last 3 Weights  Weight (lbs) 125 lb 10.6 oz 130 lb 15.3 oz 127 lb 6.8 oz  Weight (kg) 57 kg 59.4 kg 57.8 kg      Telemetry NSR - Personally Reviewed  ECG  No new ECG - Personally Reviewed  Physical Exam  GEN: Intubated Neck: No JVD Cardiac: RRR, no murmurs, rubs, or gallops.  Respiratory: Mechanical breath sounds GI: Soft MS: No edema Neuro:  not following commands Psych: Unable  to assess  Labs High Sensitivity Troponin:   Recent Labs  Lab 07/30/23 2017  TROPONINIHS 1,235*     Chemistry Recent Labs  Lab 07/31/23 1746 08/01/23 0516 08/01/23 1039 08/02/23 0227 08/03/23 0414 08/03/23 1426 08/03/23 1758 08/04/23 0220 08/04/23 1703 08/05/23 0546  NA 140   < > 143   < > 147*  --   --  152*  --  154*  K 4.8   < > 4.9   < > 3.6  --   --  3.6  --  2.5*  CL 107   < > 109   < > 114*  --   --  119*  --  126*  CO2 19*   < > 15*   < > 19*  --   --  13*  --  13*  GLUCOSE 217*   < > 171*   < > 82  --   --  138*  --  246*  BUN 23   < > 34*   < > 32*  --   --  37*  --  46*  CREATININE 1.67*   < > 1.93*   < > 1.57*  --   --  1.64*  --  1.76*  CALCIUM  8.2*   < > 7.7*   < > 6.9*  --   --  7.5*  --  7.4*  MG  --    < > 2.1  --  2.1   < > 2.0 2.3 2.4  --   PROT 4.9*  --  4.9*  --   --   --   --   --   --  5.0*  ALBUMIN 2.4*  --  2.5*  --   --   --   --   --   --  2.4*  AST 21  --  29  --   --   --   --   --   --  23  ALT 11  --  15  --   --   --   --   --   --  15  ALKPHOS 75  --  79  --   --   --   --   --   --  77  BILITOT 0.5  --  0.6  --   --   --   --   --   --  0.5  GFRNONAA 34*   < > 29*   < > 37*  --   --  35*  --  32*  ANIONGAP 14   < > 19*   < > 14  --   --  20*  --  15   < > = values in this interval not displayed.    Lipids  Recent Labs  Lab 08/01/23 0516 08/02/23 0227 08/05/23 0546  CHOL 125  --   --   TRIG 237*   < > 166*  HDL 31*  --   --   LDLCALC 47  --   --   CHOLHDL 4.0  --   --    < > = values in this interval not  displayed.    Hematology Recent Labs  Lab 08/03/23 0414 08/04/23 0220 08/05/23 0546  WBC 8.8 7.5 12.1*  RBC 3.73* 3.98 4.26  HGB 10.2* 10.8* 11.6*  HCT 33.0* 34.7* 37.6  MCV 88.5 87.2 88.3  MCH 27.3 27.1 27.2  MCHC 30.9 31.1 30.9  RDW 18.9* 19.4* 19.8*  PLT 109* 151 105*   Thyroid  Recent Labs  Lab 08/01/23 1039  TSH 0.717    BNPNo results for input(s): BNP, PROBNP in the last 168 hours.  DDimer No results for input(s): DDIMER in the last 168 hours.   Radiology  EEG adult Result Date: 08/04/2023 Sarah Lack, MD     08/04/2023 10:10 AM Patient Name: Sarah Davis MRN: [id number] Epilepsy Attending: Arleene Davis Referring Physician/Provider: Joesph Mussel, DO Date: 08/04/2023 Duration: 22.25 mins Patient history: 62yo F with ams. EEG to evaluate for seizure. Level of alertness: comatose/ lethargic AEDs during EEG study: None during eeg ( was  on propofol before EEG) Technical aspects: This EEG study was done with scalp electrodes positioned according to the 10-20 International system of electrode placement. Electrical activity was reviewed with band pass filter of 1-70Hz , sensitivity of 7 uV/mm, display speed of 38mm/sec with a 60Hz  notched filter applied as appropriate. EEG data were recorded continuously and digitally stored.  Video monitoring was available and reviewed as appropriate. Description: EEG showed continuous generalized low amplitude 2-3Hz  delta slowing. Hyperventilation and photic stimulation were not performed.   ABNORMALITY - Continuous slow, generalized IMPRESSION: This study is suggestive of severe to profound diffuse encephalopathy. No seizures or epileptiform discharges were seen throughout the recording. Sarah Davis    Cardiac Studies   Patient Profile   63 y.o. female with CAD, hypertension, tobacco use, hyperlipidemia, schizophrenia/bipolar disorder who was admitted with NSTEMI.  Assessment & Plan   NSTEMI: Has history of STEMI with stenting  of distal LCx, RCA in 2021 post complicated by V-fib arrest.  She presented to [facility] on 6/9 with altered mental status, found to have troponin elevation.  Echocardiogram at [facility] showed EF 35 to 40%.  She was transferred to [facility] for cath.  Cath on 6/12 showed patent LCx and RCA stents, chronic occlusion in mid LAD with collaterals, medical management recommended - Suspect demand ischemia (vs Takotsubo given wall motion abnormality). Continue aspirin , statin  Acute systolic heart failure: Echocardiogram at [facility] with EF 35 to 40%.  Echo repeated on 6/13 and showed EF 45 to 50% with apical hypokinesis - Appears euvolemic.  Worsening renal function and electrolytes with diuresis yesterday, she is continuing to have significant stool output, would hold off on further diuresis - Hypotensive following intubation, initially required levophed but now off pressors.  Can add GDMT as she recovers  AMS: Worsening encephalopathy, required intubation for airway protection on 6/13.  Neurology consulted, no acute abnormalities on head CT or brain MRI.  Workup showed significantly elevated ammonia, suspect contributing to encephalopathy, though unclear cause as no evidence of cirrhosis on imaging.  Started on lactulose  and rifaximin per PCCM  Acute respiratory failure: Intubated, management per PCCM  AKI: Creatinine up to 1.93 on 6/13.  She has been given IV fluids, creatinine improved.  Worsening renal function this morning after diuresis yesterday, Cr 1.76, would hold off on further diuresis for now  Cardiology will follow from a distance.  Will plan to add GDMT once she is extubated and BP stable.   For questions or updates, please contact [facility] Please consult [email or web address] for contact info under     Signed, Wendie Hamburg, MD  08/05/2023, 8:48 AM

## 2023-08-05 NOTE — Progress Notes (Signed)
 PM labs reviewed:  Sodium still rising despite inrease [facility].  K 3.7 Bicarb up to 17  Plan: Start D5w x 12 hrs to help sodium Additional IV and enteral K+   Joesph Mussel, DO 08/05/23 5:50 PM [facility]  For contact information, see Amion. If no response to pager, please call PCCM consult pager. After hours, 7PM- 7AM, please call Elink.

## 2023-08-06 DIAGNOSIS — J9601 Acute respiratory failure with hypoxia: Secondary | ICD-10-CM | POA: Diagnosis not present

## 2023-08-06 DIAGNOSIS — I214 Non-ST elevation (NSTEMI) myocardial infarction: Secondary | ICD-10-CM | POA: Diagnosis not present

## 2023-08-06 DIAGNOSIS — G9341 Metabolic encephalopathy: Secondary | ICD-10-CM | POA: Diagnosis not present

## 2023-08-06 DIAGNOSIS — N1832 Chronic kidney disease, stage 3b: Secondary | ICD-10-CM | POA: Diagnosis not present

## 2023-08-06 DIAGNOSIS — R739 Hyperglycemia, unspecified: Secondary | ICD-10-CM

## 2023-08-06 LAB — CULTURE, BLOOD (ROUTINE X 2)
Culture: NO GROWTH
Culture: NO GROWTH

## 2023-08-06 LAB — COMPREHENSIVE METABOLIC PANEL WITH GFR
ALT: 16 U/L (ref 0–44)
AST: 20 U/L (ref 15–41)
Albumin: 2.2 g/dL — ABNORMAL LOW (ref 3.5–5.0)
Alkaline Phosphatase: 77 U/L (ref 38–126)
Anion gap: 11 (ref 5–15)
BUN: 32 mg/dL — ABNORMAL HIGH (ref 8–23)
CO2: 16 mmol/L — ABNORMAL LOW (ref 22–32)
Calcium: 7.7 mg/dL — ABNORMAL LOW (ref 8.9–10.3)
Chloride: 123 mmol/L — ABNORMAL HIGH (ref 98–111)
Creatinine, Ser: 1.17 mg/dL — ABNORMAL HIGH (ref 0.44–1.00)
GFR, Estimated: 52 mL/min — ABNORMAL LOW (ref >60)
Glucose, Bld: 289 mg/dL — ABNORMAL HIGH (ref 70–99)
Potassium: 4 mmol/L (ref 3.5–5.1)
Sodium: 150 mmol/L — ABNORMAL HIGH (ref 135–145)
Total Bilirubin: 0.4 mg/dL (ref 0.0–1.2)
Total Protein: 4.7 g/dL — ABNORMAL LOW (ref 6.5–8.1)

## 2023-08-06 LAB — GLUCOSE, CAPILLARY
Glucose-Capillary: 247 mg/dL — ABNORMAL HIGH (ref 70–99)
Glucose-Capillary: 253 mg/dL — ABNORMAL HIGH (ref 70–99)
Glucose-Capillary: 275 mg/dL — ABNORMAL HIGH (ref 70–99)
Glucose-Capillary: 278 mg/dL — ABNORMAL HIGH (ref 70–99)
Glucose-Capillary: 301 mg/dL — ABNORMAL HIGH (ref 70–99)
Glucose-Capillary: 354 mg/dL — ABNORMAL HIGH (ref 70–99)

## 2023-08-06 LAB — CBC
HCT: 35.1 % — ABNORMAL LOW (ref 36.0–46.0)
Hemoglobin: 10.6 g/dL — ABNORMAL LOW (ref 12.0–15.0)
MCH: 26.8 pg (ref 26.0–34.0)
MCHC: 30.2 g/dL (ref 30.0–36.0)
MCV: 88.9 fL (ref 80.0–100.0)
Platelets: 60 10*3/uL — ABNORMAL LOW (ref 150–400)
RBC: 3.95 MIL/uL (ref 3.87–5.11)
RDW: 19.6 % — ABNORMAL HIGH (ref 11.5–15.5)
WBC: 11.9 10*3/uL — ABNORMAL HIGH (ref 4.0–10.5)
nRBC: 0.2 % (ref 0.0–0.2)

## 2023-08-06 LAB — MAGNESIUM: Magnesium: 2.2 mg/dL (ref 1.7–2.4)

## 2023-08-06 LAB — PHOSPHORUS: Phosphorus: 1.9 mg/dL — ABNORMAL LOW (ref 2.5–4.6)

## 2023-08-06 LAB — AMMONIA: Ammonia: 37 umol/L — ABNORMAL HIGH (ref 9–35)

## 2023-08-06 MED ORDER — LACTULOSE 10 GM/15ML PO SOLN
30.0000 g | Freq: Three times a day (TID) | ORAL | Status: DC
  Administered 2023-08-06 – 2023-08-09 (×11): 30 g
  Filled 2023-08-06 (×10): qty 45

## 2023-08-06 MED ORDER — INSULIN GLARGINE-YFGN 100 UNIT/ML ~~LOC~~ SOLN
15.0000 [IU] | Freq: Every day | SUBCUTANEOUS | Status: DC
  Administered 2023-08-07 – 2023-08-19 (×13): 15 [IU] via SUBCUTANEOUS
  Filled 2023-08-06 (×13): qty 0.15

## 2023-08-06 MED ORDER — INSULIN GLARGINE-YFGN 100 UNIT/ML ~~LOC~~ SOLN
10.0000 [IU] | Freq: Every day | SUBCUTANEOUS | Status: DC
  Administered 2023-08-06: 10 [IU] via SUBCUTANEOUS
  Filled 2023-08-06 (×2): qty 0.1

## 2023-08-06 MED ORDER — INSULIN GLARGINE-YFGN 100 UNIT/ML ~~LOC~~ SOLN
5.0000 [IU] | Freq: Once | SUBCUTANEOUS | Status: AC
  Administered 2023-08-07: 5 [IU] via SUBCUTANEOUS
  Filled 2023-08-06: qty 0.05

## 2023-08-06 MED ORDER — LACTULOSE 10 GM/15ML PO SOLN: 30.0000 g | Freq: Every day | ORAL | Status: DC

## 2023-08-06 NOTE — Progress Notes (Addendum)
 NAME:  Arlita Buffkin, MRN:  [id number], DOB:  01-Jul-1960, LOS: 7 ADMISSION DATE:  07/30/2023, CONSULTATION DATE: 08/01/2023 REFERRING MD: Cardiology, CHIEF COMPLAINT: Acute respiratory distress  History of Present Illness:  63 year old female who underwent cardiac catheterization 07/31/2023 without acute interventions required.  She does have a history of polysubstance abuse, tobacco abuse, bipolar, thyroid disease and at the time of pulmonary critical care arriving at her bedside she was in acute respiratory failure and required intubation emergently.  She transferred to the intensive care unit.  She is currently on Levophed drip.  Chest x-ray, ABG, CT of the head have been ordered at this time.  Pertinent  Medical History   Past Medical History:  Diagnosis Date   Anxiety    Bipolar 2 disorder (HCC)    Chronic hepatitis C virus genotype 1 infection (HCC)    Chronic pancreatitis (HCC)    CKD (chronic kidney disease) stage 4, GFR 15-29 ml/min (HCC)    COPD (chronic obstructive pulmonary disease) (HCC)    Coronary artery disease 11/2019   s/p PTCI  LCx & RCA. Complicated by Vfib arrest   DM (diabetes mellitus), type 2 (HCC)    HFrEF (heart failure with reduced ejection fraction) (HCC) 08/01/2023   EF 45-50%   Hyperparathyroidism, secondary (HCC)    Hypothyroid    Lithium toxicity    Marijuana abuse    NSTEMI (non-ST elevated myocardial infarction) (HCC) 07/30/2023   med management   Peripheral autonomic neuropathy due to DM [facility])    STEMI (ST elevation myocardial infarction) (HCC) 2021   Stroke [facility])    right hemiparesis   Substance abuse (HCC)    Tardive dyskinesia    Tobacco abuse    Significant Hospital Events: Including procedures, antibiotic start and stop dates in addition to other pertinent events   6/12 - CT negative for LVO, MRI negative. No seizures on EEG.  6/14 remains somnolent.  6/16 unresponsive, ammonia back in 250 range; started carnitine, increased lactulose  6/17  ammonia back in 70s, still minimally responsive, remaining off sedation   Interim History / Subjective:  No acute events overnight. Remains off sedation. Gagging on ETT more.   Objective    Blood pressure (!) 151/88, pulse 77, temperature 98.2 F (36.8 C), temperature source Oral, resp. rate (!) 24, height 5' 5 (1.651 m), weight 60.9 kg, SpO2 97%.    Vent Mode: PRVC FiO2 (%):  [30 %] 30 % Set Rate:  [24 bmp] 24 bmp Vt Set:  [450 mL] 450 mL PEEP:  [5 cmH20] 5 cmH20 Plateau Pressure:  [6 cmH20-12 cmH20] 12 cmH20   Intake/Output Summary (Last 24 hours) at 08/06/2023 0717 Last data filed at 08/06/2023 0700 Gross per 24 hour  Intake 5616.94 ml  Output 4450 ml  Net 1166.94 ml   Filed Weights   08/04/23 0440 08/05/23 0500 08/06/23 0554  Weight: 59.4 kg 57 kg 60.9 kg    Examination: General: critically ill appearing woman lying in bed in NAD HENT: [place]/AT, eyes anicteric  Lungs: breathing comfortably on MV, no rhonchi, minimal ETT secretions Cardiovascular: S1S2, RRR Abdomen: soft, NT  Extremities:  improving but present peripheral edema Neuro: RASS 0, more gagging and coughing, no swallowing spontaneously. Not tracking or following commands.   Ammonia 37 Na+  150 K+ 4.0 Bicarb 16, AG 11 BUN 32 Cr 1.17 WBC 11.9 H/H 10.6/35.1 Platelets 60   Resolved problem list:  Hypokalemia  Assessment and Plan   Acute metabolic encephalopathy, presumed related to hyperammonemia, although cause  of latter unclear. Brain MRI without acute findings. 6/13 EEG showed no seizures, same on 6/16.  Hypernatremia could be contributing, but is unlikely to fully explain her findings.  TSH WNL.  -con't holding sedation -con't checking ammonia levels  & treatment for hyperammonemia -carnitine level pending -con't rifaximin BID and lactulose  TID due to poor stool output overnight  -lead level pending -carnitine level pending -vit E, B6, C, B1, zinc pending  Increased ammonia level, no evidence  of cirrhosis on imaging or by LFT's. Not on medications susceptible to cause elevated ammonia. Question carnitine deficiency? Trying to determine if another vitamin deficiency can cause this.  Copious stool output with lactulose , but ammonia responding -con't rifaximin BID, lactulose  TID -con't carnitine -daily ammonia level -carnitine level pending from 6/16 -Reviewed [facility] records-- had severe protein energy malnutrition with refeeding syndrome during that admission, was hospitalized 7 days. Had negative LP, EEGs, had DI that was treated with DDAVP. Ammonia was never checked during that admission.   Acute respiratory failure with hypoxia requiring MV -LTVV -VAP prevention protocol -PAD protocol for sedation -daily SAT & SBT; tolerating 5/5 but mental status currently precludes extubation  AGMA; bicarb dropped again after infusion stopped- potentially due to diarrhea. LA normal x 2 on 6/16. -con't oral bicarb  AKI on chronic kidney disease (CKD) stage G3b/A2, moderately decreased glomerular filtration rate (GFR) between 30-44 mL/min/1.73 square meter and albuminuria creatinine ratio between 30-299 mg/g (HCC) Hypervolemia -hold lasix today; will need to restart eventually  -renally dose meds, avoid nephrotoxic meds  Hypernatremia -stopped d5 this morning -monitor -con't enteral [facility] 300cc q2h  Thrombocytopenia; 4T score= 5; intermediate probability of HIT. Also potentially due to antibiotics -check PF4 Ab & SRA -hold [place] heparin   CAD with NSTEMI (non-ST elevated myocardial infarction) ; LHC demonstrated chronic occlusion of LAD HFrEF> not sure this was all acute- concern for stress induced CM vs chronic ICM -Likely demand related based on absence of new lesions.  -con't supportive care -defer GDMT currently with AKI -aspirin , statin -monitor on tele  Hypothyroidism -con't synthroid   Hyperglycemia, worse on D5w; but previously well controlled -stopping d5w -SSI PRN -goal  BG 140-180   Best Practice (right click and Reselect all SmartList Selections daily)   Diet/type: NPO - tube feeds.  DVT prophylaxis prophylactic heparin   Pressure ulcer(s): N/A- defer to RN assessment GI prophylaxis: H2B Lines: N/A Foley:  N/A Code Status:  full code Last date of multidisciplinary goals of care discussion [mother and sister updated 6/16]   This patient is critically ill with multiple organ system failure which requires frequent high complexity decision making, assessment, support, evaluation, and titration of therapies. This was completed through the application of advanced monitoring technologies and extensive interpretation of multiple databases. During this encounter critical care time was devoted to patient care services described in this note for 39 minutes.  Joesph Mussel, DO 08/06/23 8:17 AM [facility]  For contact information, see Amion. If no response to pager, please call PCCM consult pager. After hours, 7PM- 7AM, please call Elink.

## 2023-08-06 NOTE — Procedures (Signed)
 Cortrak  Person Inserting Tube:  Edwena Graham D, RD Tube Type:  Cortrak - 43 inches Tube Size:  10 Tube Location:  Left nare Initial Placement:  Stomach Secured by: Bridle Technique Used to Measure Tube Placement:  Marking at nare/corner of mouth Cortrak Secured At:  69 cm Procedure Comments:  Cortrak Tube Team Note:  Consult received to place a Cortrak feeding tube.   No x-ray is required. RN may begin using tube.   If the tube becomes dislodged please keep the tube and contact the Cortrak team at [email or web address] for replacement.  If after hours and replacement cannot be delayed, place a NG tube and confirm placement with an abdominal x-ray.    Edwena Graham, RD, LDN, CNSC Registered Dietitian II Please reach out via secure chat

## 2023-08-06 NOTE — Plan of Care (Signed)
  Problem: Health Behavior/Discharge Planning: Goal: Ability to manage health-related needs will improve Outcome: Progressing   Problem: Clinical Measurements: Goal: Will remain free from infection Outcome: Progressing   Problem: Clinical Measurements: Goal: Respiratory complications will improve Outcome: Progressing   Problem: Activity: Goal: Risk for activity intolerance will decrease Outcome: Progressing   Problem: Nutrition: Goal: Adequate nutrition will be maintained Outcome: Progressing

## 2023-08-06 NOTE — Progress Notes (Signed)
 At bedside for PIV insertion. Bilateral extremities with extreme edema and bruising. Assessed with U/S. Very poor vasculature. No suitable vessels for PIV/ML insertion. Vessels in upper right arm over 3 cm in depth. Right arm unable to assess due to multiple Mepilex dressings in place. RN aware. Recommend central access for further vascular needs.

## 2023-08-06 NOTE — Progress Notes (Signed)
 eLink Physician-Brief Progress Note Patient Name: Sarah Davis DOB: [birth year] MRN: [id number]   Date of Service  08/06/2023  HPI/Events of Note  63 year old female who underwent cardiac catheterization 07/31/2023 without acute interventions required.   Tube feeds initiated today, hyperglycemic despite glargine earlier  eICU Interventions  Increase glargine, maintain current SSI.     Intervention Category Intermediate Interventions: Hyperglycemia - evaluation and treatment  Farley Crooker 08/06/2023, 11:19 PM

## 2023-08-06 NOTE — Inpatient Diabetes Management (Signed)
 Inpatient Diabetes Program Recommendations  AACE/ADA: New Consensus Statement on Inpatient Glycemic Control (2015)  Target Ranges:  Prepandial:   less than 140 mg/dL      Peak postprandial:   less than 180 mg/dL (1-2 hours)      Critically ill patients:  140 - 180 mg/dL   Lab Results  Component Value Date   GLUCAP 275 (H) 08/06/2023   HGBA1C 4.8 08/01/2023    Latest Reference Range & Units 08/05/23 08:16 08/05/23 11:30 08/05/23 15:38 08/05/23 20:05 08/05/23 23:38 08/06/23 03:16 08/06/23 07:57 08/06/23 11:04  Glucose-Capillary 70 - 99 mg/dL 409 (H) 811 (H) 914 (H) 276 (H) 280 (H) 247 (H) 253 (H) 275 (H)  (H): Data is abnormally high  Diabetes history: DM2 Outpatient Diabetes medications: Lantus 30 units Current orders for Inpatient glycemic control: Novolog  0-9 units q 4 hrs.  Inpatient Diabetes Program Recommendations:   A1c 4.8 and had hypoglycemia @ home.  Noted Osmolite @ 50 cc/hr and Cortrak feeding tube placed. Please consider: -Add Novolog  2 units q 4 hrs. Tube feed coverage (hold if tube feeding held or stopped for any reason).  Thank you, Jaylina Ramdass E. Pamella Samons, RN, MSN, CDCES  Diabetes Coordinator Inpatient Glycemic Control Team Team Pager [phone] (8am-5pm) 08/06/2023 11:15 AM

## 2023-08-07 ENCOUNTER — Encounter (HOSPITAL_COMMUNITY): Payer: MEDICAID

## 2023-08-07 ENCOUNTER — Inpatient Hospital Stay (HOSPITAL_COMMUNITY): Payer: MEDICAID

## 2023-08-07 DIAGNOSIS — G9341 Metabolic encephalopathy: Secondary | ICD-10-CM | POA: Diagnosis not present

## 2023-08-07 DIAGNOSIS — N1832 Chronic kidney disease, stage 3b: Secondary | ICD-10-CM | POA: Diagnosis not present

## 2023-08-07 DIAGNOSIS — R609 Edema, unspecified: Secondary | ICD-10-CM | POA: Diagnosis not present

## 2023-08-07 DIAGNOSIS — I214 Non-ST elevation (NSTEMI) myocardial infarction: Secondary | ICD-10-CM | POA: Diagnosis not present

## 2023-08-07 DIAGNOSIS — J9601 Acute respiratory failure with hypoxia: Secondary | ICD-10-CM | POA: Diagnosis not present

## 2023-08-07 LAB — COMPREHENSIVE METABOLIC PANEL WITH GFR
ALT: 18 U/L (ref 0–44)
AST: 25 U/L (ref 15–41)
Albumin: 1.8 g/dL — ABNORMAL LOW (ref 3.5–5.0)
Alkaline Phosphatase: 67 U/L (ref 38–126)
Anion gap: 6 (ref 5–15)
BUN: 21 mg/dL (ref 8–23)
CO2: 20 mmol/L — ABNORMAL LOW (ref 22–32)
Calcium: 7.7 mg/dL — ABNORMAL LOW (ref 8.9–10.3)
Chloride: 117 mmol/L — ABNORMAL HIGH (ref 98–111)
Creatinine, Ser: 0.86 mg/dL (ref 0.44–1.00)
GFR, Estimated: >60 mL/min (ref >60)
Glucose, Bld: 297 mg/dL — ABNORMAL HIGH (ref 70–99)
Potassium: 3.2 mmol/L — ABNORMAL LOW (ref 3.5–5.1)
Sodium: 143 mmol/L (ref 135–145)
Total Bilirubin: 0.4 mg/dL (ref 0.0–1.2)
Total Protein: 4.4 g/dL — ABNORMAL LOW (ref 6.5–8.1)

## 2023-08-07 LAB — GLUCOSE, CAPILLARY
Glucose-Capillary: 203 mg/dL — ABNORMAL HIGH (ref 70–99)
Glucose-Capillary: 209 mg/dL — ABNORMAL HIGH (ref 70–99)
Glucose-Capillary: 218 mg/dL — ABNORMAL HIGH (ref 70–99)
Glucose-Capillary: 224 mg/dL — ABNORMAL HIGH (ref 70–99)
Glucose-Capillary: 254 mg/dL — ABNORMAL HIGH (ref 70–99)
Glucose-Capillary: 297 mg/dL — ABNORMAL HIGH (ref 70–99)

## 2023-08-07 LAB — MAGNESIUM: Magnesium: 2 mg/dL (ref 1.7–2.4)

## 2023-08-07 LAB — VITAMIN E
Vitamin E (Alpha Tocopherol): 15.9 mg/L (ref 9.0–29.0)
Vitamin E(Gamma Tocopherol): 3.9 mg/L (ref 0.5–4.9)

## 2023-08-07 LAB — CBC
HCT: 34.4 % — ABNORMAL LOW (ref 36.0–46.0)
Hemoglobin: 10.3 g/dL — ABNORMAL LOW (ref 12.0–15.0)
MCH: 27 pg (ref 26.0–34.0)
MCHC: 29.9 g/dL — ABNORMAL LOW (ref 30.0–36.0)
MCV: 90.1 fL (ref 80.0–100.0)
Platelets: 38 10*3/uL — ABNORMAL LOW (ref 150–400)
RBC: 3.82 MIL/uL — ABNORMAL LOW (ref 3.87–5.11)
RDW: 19.6 % — ABNORMAL HIGH (ref 11.5–15.5)
WBC: 16.9 10*3/uL — ABNORMAL HIGH (ref 4.0–10.5)
nRBC: 0.2 % (ref 0.0–0.2)

## 2023-08-07 LAB — PHOSPHORUS: Phosphorus: 2.1 mg/dL — ABNORMAL LOW (ref 2.5–4.6)

## 2023-08-07 LAB — VITAMIN C: Vitamin C: 0.5 mg/dL (ref 0.4–2.0)

## 2023-08-07 LAB — HEPARIN INDUCED PLATELET AB (HIT ANTIBODY): Heparin Induced Plt Ab: 0.01 {OD_unit} (ref 0.000–0.400)

## 2023-08-07 LAB — AMMONIA: Ammonia: 33 umol/L (ref 9–35)

## 2023-08-07 LAB — VITAMIN B1: Vitamin B1 (Thiamine): 86 nmol/L (ref 66.5–200.0)

## 2023-08-07 LAB — ZINC: Zinc: 42 ug/dL — ABNORMAL LOW (ref 44–115)

## 2023-08-07 MED ORDER — REVEFENACIN 175 MCG/3ML IN SOLN
175.0000 ug | Freq: Every day | RESPIRATORY_TRACT | Status: DC
  Administered 2023-08-07 – 2023-09-01 (×26): 175 ug via RESPIRATORY_TRACT
  Filled 2023-08-07 (×26): qty 3

## 2023-08-07 MED ORDER — INSULIN ASPART 100 UNIT/ML IJ SOLN
0.0000 [IU] | INTRAMUSCULAR | Status: DC
  Administered 2023-08-07 (×2): 5 [IU] via SUBCUTANEOUS
  Administered 2023-08-07: 8 [IU] via SUBCUTANEOUS
  Administered 2023-08-07: 5 [IU] via SUBCUTANEOUS
  Administered 2023-08-08 (×2): 2 [IU] via SUBCUTANEOUS
  Administered 2023-08-08: 3 [IU] via SUBCUTANEOUS
  Administered 2023-08-08: 5 [IU] via SUBCUTANEOUS
  Administered 2023-08-08: 2 [IU] via SUBCUTANEOUS
  Administered 2023-08-08 – 2023-08-09 (×6): 5 [IU] via SUBCUTANEOUS
  Administered 2023-08-09 – 2023-08-10 (×7): 3 [IU] via SUBCUTANEOUS
  Administered 2023-08-10: 2 [IU] via SUBCUTANEOUS
  Administered 2023-08-11: 3 [IU] via SUBCUTANEOUS
  Administered 2023-08-11: 2 [IU] via SUBCUTANEOUS
  Administered 2023-08-11 – 2023-08-12 (×6): 3 [IU] via SUBCUTANEOUS
  Administered 2023-08-12: 2 [IU] via SUBCUTANEOUS
  Administered 2023-08-12 – 2023-08-13 (×2): 3 [IU] via SUBCUTANEOUS
  Administered 2023-08-13: 2 [IU] via SUBCUTANEOUS
  Administered 2023-08-13 (×4): 3 [IU] via SUBCUTANEOUS
  Administered 2023-08-14: 5 [IU] via SUBCUTANEOUS
  Administered 2023-08-14 (×3): 2 [IU] via SUBCUTANEOUS
  Administered 2023-08-14: 5 [IU] via SUBCUTANEOUS
  Administered 2023-08-14: 3 [IU] via SUBCUTANEOUS
  Administered 2023-08-15 (×4): 2 [IU] via SUBCUTANEOUS
  Administered 2023-08-15: 3 [IU] via SUBCUTANEOUS
  Administered 2023-08-16: 2 [IU] via SUBCUTANEOUS
  Administered 2023-08-16 (×4): 3 [IU] via SUBCUTANEOUS
  Administered 2023-08-17 (×2): 2 [IU] via SUBCUTANEOUS
  Administered 2023-08-17 (×2): 3 [IU] via SUBCUTANEOUS
  Administered 2023-08-17: 5 [IU] via SUBCUTANEOUS
  Administered 2023-08-18 (×3): 3 [IU] via SUBCUTANEOUS
  Administered 2023-08-19 (×2): 5 [IU] via SUBCUTANEOUS
  Administered 2023-08-19: 3 [IU] via SUBCUTANEOUS
  Administered 2023-08-19: 2 [IU] via SUBCUTANEOUS

## 2023-08-07 MED ORDER — POTASSIUM CHLORIDE 20 MEQ PO PACK
40.0000 meq | PACK | ORAL | Status: AC
  Administered 2023-08-07 (×2): 40 meq
  Filled 2023-08-07 (×2): qty 2

## 2023-08-07 MED ORDER — FREE WATER
200.0000 mL | Status: DC
  Administered 2023-08-07 – 2023-08-09 (×11): 200 mL

## 2023-08-07 MED ORDER — ARFORMOTEROL TARTRATE 15 MCG/2ML IN NEBU
15.0000 ug | INHALATION_SOLUTION | Freq: Two times a day (BID) | RESPIRATORY_TRACT | Status: DC
  Administered 2023-08-07 – 2023-09-01 (×50): 15 ug via RESPIRATORY_TRACT
  Filled 2023-08-07 (×48): qty 2

## 2023-08-07 MED ORDER — K PHOS MONO-SOD PHOS DI & MONO 155-852-130 MG PO TABS
500.0000 mg | ORAL_TABLET | ORAL | Status: AC
  Administered 2023-08-07 (×3): 500 mg
  Filled 2023-08-07 (×3): qty 2

## 2023-08-07 MED ORDER — GLUCERNA 1.2 CAL PO LIQD
1000.0000 mL | ORAL | Status: DC
  Administered 2023-08-07 – 2023-08-19 (×13): 1000 mL
  Filled 2023-08-07 (×18): qty 1000

## 2023-08-07 MED ORDER — FUROSEMIDE 10 MG/ML IJ SOLN
60.0000 mg | Freq: Three times a day (TID) | INTRAMUSCULAR | Status: AC
  Administered 2023-08-07 (×3): 60 mg via INTRAVENOUS
  Filled 2023-08-07 (×3): qty 6

## 2023-08-07 MED ORDER — INSULIN ASPART 100 UNIT/ML IJ SOLN
3.0000 [IU] | INTRAMUSCULAR | Status: DC
  Administered 2023-08-07 – 2023-08-09 (×12): 3 [IU] via SUBCUTANEOUS

## 2023-08-07 NOTE — Progress Notes (Signed)
 Patient was transported to CT on full vent support & back to 2M03 without any complications.

## 2023-08-07 NOTE — Progress Notes (Signed)
 VASCULAR LAB    Bilateral upper extremity venous duplex has been performed.  See CV proc for preliminary results.   My Madariaga, RVT 08/07/2023, 4:02 PM

## 2023-08-07 NOTE — Progress Notes (Signed)
 Nutrition Follow-up  DOCUMENTATION CODES:  Non-severe (moderate) malnutrition in context of chronic illness  INTERVENTION:  Adjust tube feeding via cortrak: Glucerna 1.2 at 55 ml/h (1320 ml per day) 300mL free water flush q2h (per MD) Provides 1520 kcal, 87 gm protein, 1063 ml free water daily (TF+flush = 4663 mL of free water) Assess micronutrients for possible etiology of AMS. Also noted pinpoint hemorrhaging to arms and legs and family reports worsening weakness (unable to walk dog): The following are still pending Vitamin E, B6, thiamine, zinc MVI BID x 14 days, then transition to 1x/d, 500mg  of IV thiamine x 5 days  NUTRITION DIAGNOSIS:  Moderate Malnutrition related to chronic illness as evidenced by severe muscle depletion, moderate fat depletion. - remains applicable  GOAL:  Patient will meet greater than or equal to 90% of their needs - progressing  MONITOR:  TF tolerance, Skin, I & O's, Vent status, Labs  REASON FOR ASSESSMENT:  Consult Enteral/tube feeding initiation and management  ASSESSMENT:  Pt with hx of CKD3, COPD, CAD, hx CVA, and DM type 2 presented to ED from [facility] for cardiac cath.  Pt with ongoing AMS this admission. Rapid response called 6/12 and 6/13. On 6/13 pt ultimately intubated and transferred to ICU.   6/11 - admitted 6/13 - intubated  Pt resting in bed at the time of assessment, remains intubated on vent support. RN getting pt to be transported to CT this AM.   CBGs consistently elevated >200. Will adjust TF formula to lower carb formulation. Discussed with pharmacy, also added insulin  for TF coverage.   Pt discussed during ICU rounds and with RN and MD. RN reports that pt is moving her lower extremities but still not purposeful and is not following commands.   MV: 12.4 L/min Temp (24hrs), Avg:98.5 F (36.9 C), Min:98 F (36.7 C), Max:99 F (37.2 C) MAP (cuff): 88 mmHg  Admit weight: 55.1 kg  Current weight: 59.4  kg   Intake/Output Summary (Last 24 hours) at 08/07/2023 1305 Last data filed at 08/07/2023 1200 Gross per 24 hour  Intake 2596.48 ml  Output 1600 ml  Net 996.48 ml  Net IO Since Admission: 6,447.21 mL [08/07/23 1305]  Drains/Lines: Cortrak FMS placed 6/14, out x 24 hours UOP x 24 hours  Average Meal Intake: 6/11: 20% intake x 1 recorded meals  Nutritionally Relevant Medications: Scheduled Meds:  atorvastatin   80 mg Per Tube Daily   docusate  100 mg Per Tube BID   famotidine  20 mg Per Tube Daily   PROSource TF20  60 mL Per Tube Daily   folic acid  1 mg Per Tube Daily   free water  300 mL Per Tube Q2H   insulin  aspart  0-9 Units Subcutaneous Q4H   insulin  glargine-yfgn  15 Units Subcutaneous Daily   lactulose   30 g Per Tube TID   multivitamin with minerals  1 tablet Per Tube BID   polyethylene glycol  17 g Per Tube Daily   potassium chloride   40 mEq Per Tube Q4H   rifaximin  550 mg Per Tube BID   sodium bicarbonate  1,300 mg Oral Q6H   Continuous Infusions:  feeding supplement (OSMOLITE 1.2 CAL) 50 mL/hr at 08/07/23 0600   levOCARNitine (CARNITOR) 900 mg in sodium chloride  0.9 % 250 mL (3.6 mg/mL) IVPB Stopped (08/07/23 0414)   PRN Meds: ondansetron   Labs Reviewed: K 3.2 Chloride 117 Phosphorus 2.1 CBG ranges from 247-354 mg/dL over the last 24 hours HgbA1c  4.8% (6/13)  Micronutrient Profile CRP 3.1, inflammation not high enough to skew labs. Results reported will be true serum levels Vitamin C - 0.5 (WNL) Vitamin D - 82.63 (WNL) Vitamin E- pending Vitamin B6 - pending Vitamin B12 - 655 (WNL) Thiamine- pending Folate - 3.8 (low), 1mg  daily supplementation added Zinc- pending Carnitine - pending  NUTRITION - FOCUSED PHYSICAL EXAM: Flowsheet Row Most Recent Value  Orbital Region Moderate depletion  Upper Arm Region No depletion  Thoracic and Lumbar Region Mild depletion  Buccal Region Moderate depletion  Temple Region Mild depletion   Clavicle Bone Region Moderate depletion  Clavicle and Acromion Bone Region Moderate depletion  Scapular Bone Region Moderate depletion  Dorsal Hand Unable to assess  [edema]  Patellar Region Severe depletion  Anterior Thigh Region Severe depletion  Posterior Calf Region Severe depletion  Edema (RD Assessment) Moderate  [hands]  Hair Reviewed  [thin]  Eyes Unable to assess  Mouth Reviewed  [blood noted on lips]  Skin Reviewed  [pin point hemorrhages]  Nails Reviewed    Diet Order:   Diet Order     None       EDUCATION NEEDS:  Not appropriate for education at this time  Skin:  Skin Assessment: Reviewed RN Assessment  Last BM:  6/18  Height:  Ht Readings from Last 1 Encounters:  07/30/23 5' 5 (1.651 m)    Weight:  Wt Readings from Last 1 Encounters:  08/07/23 59.4 kg    Ideal Body Weight:  56.8 kg  BMI:  Body mass index is 21.79 kg/m.  Estimated Nutritional Needs:  Kcal:  1500-1700 kcal/d Protein:  75-90g/d Fluid:  >1.5L/d    Edwena Graham, RD, LDN Registered Dietitian II Please reach out via secure chat

## 2023-08-07 NOTE — Progress Notes (Signed)
 [facility] ADULT ICU REPLACEMENT PROTOCOL   The patient does apply for the [facility] Adult ICU Electrolyte Replacment Protocol based on the criteria listed below:   1.Exclusion criteria: TCTS, ECMO, Dialysis, and Myasthenia Gravis patients 2. Is GFR >/= 30 ml/min? Yes.    Patient's GFR today is >60 3. Is SCr </= 2? Yes.   Patient's SCr is 0.86 mg/dL 4. Did SCr increase >/= 0.5 in 24 hours? No. 5.Pt's weight >40kg  Yes.   6. Abnormal electrolyte(s): K 3.2  7. Electrolytes replaced per protocol 8.  Call MD STAT for K+ </= 2.5, Phos </= 1, or Mag </= 1 Physician:    Bonnell Placzek A 08/07/2023 5:55 AM

## 2023-08-07 NOTE — Progress Notes (Signed)
 NAME:  Sarah Davis, MRN:  [id number], DOB:  [birth year], LOS: 8 ADMISSION DATE:  07/30/2023, CONSULTATION DATE: 08/01/2023 REFERRING MD: Cardiology, CHIEF COMPLAINT: Acute respiratory distress  History of Present Illness:  63 year old female who underwent cardiac catheterization 07/31/2023 without acute interventions required.  She does have a history of polysubstance abuse, tobacco abuse, bipolar, thyroid disease and at the time of pulmonary critical care arriving at her bedside she was in acute respiratory failure and required intubation emergently.  She transferred to the intensive care unit.  She is currently on Levophed drip.  Chest x-ray, ABG, CT of the head have been ordered at this time.  Pertinent  Medical History   Past Medical History:  Diagnosis Date   Anxiety    Bipolar 2 disorder (HCC)    Chronic hepatitis C virus genotype 1 infection (HCC)    Chronic pancreatitis (HCC)    CKD (chronic kidney disease) stage 4, GFR 15-29 ml/min (HCC)    COPD (chronic obstructive pulmonary disease) (HCC)    Coronary artery disease 11/2019   s/p PTCI  LCx & RCA. Complicated by Vfib arrest   DM (diabetes mellitus), type 2 (HCC)    HFrEF (heart failure with reduced ejection fraction) (HCC) 08/01/2023   EF 45-50%   Hyperparathyroidism, secondary (HCC)    Hypothyroid    Lithium toxicity    Marijuana abuse    NSTEMI (non-ST elevated myocardial infarction) (HCC) 07/30/2023   med management   Peripheral autonomic neuropathy due to DM [facility])    STEMI (ST elevation myocardial infarction) (HCC) 2021   Stroke [name]    right hemiparesis   Substance abuse (HCC)    Tardive dyskinesia    Tobacco abuse    Significant Hospital Events: Including procedures, antibiotic start and stop dates in addition to other pertinent events   6/12 - CT negative for LVO, MRI negative. No seizures on EEG.  6/14 remains somnolent.  6/16 unresponsive, ammonia back in 250 range; started carnitine, increased lactulose  6/17  ammonia back in 70s, still minimally responsive, remaining off sedation   Interim History / Subjective:  Remains off sedation.  Afebrile overnight.   Objective    Blood pressure (!) 143/80, pulse 88, temperature 98.9 F (37.2 C), temperature source Axillary, resp. rate (!) 22, height 5' 5 (1.651 m), weight 60.9 kg, SpO2 97%.    Vent Mode: PRVC FiO2 (%):  [30 %] 30 % Set Rate:  [24 bmp] 24 bmp Vt Set:  [450 mL] 450 mL PEEP:  [5 cmH20] 5 cmH20 Pressure Support:  [5 cmH20] 5 cmH20 Plateau Pressure:  [14 cmH20-18 cmH20] 16 cmH20   Intake/Output Summary (Last 24 hours) at 08/07/2023 0713 Last data filed at 08/07/2023 0600 Gross per 24 hour  Intake 2809.51 ml  Output 1750 ml  Net 1059.51 ml   Filed Weights   08/04/23 0440 08/05/23 0500 08/06/23 0554  Weight: 59.4 kg 57 kg 60.9 kg    Examination: General: critically ill appearing woman lying in bed in no acute distress HENT: [place]/AT, eyes anicteric, endotracheal tube in place Lungs: Minimal thin clear secretions from endotracheal tube, scattered rhonchi.  Breathing comfortably on mechanical ventilation.  Expiratory obstructed but no wheezing. Cardiovascular: S1-S2, regular rate and rhythm Abdomen: Soft, nontender Extremities: Persistent upper extremity predominant edema Neuro: RASS -3, not tracking, not following commands.  Intact cough.  Ammonia 33 Na+  143 K+ 3.2 Bicarb 20, AG 6 BUN 21 Cr 0.86 WBC 16.9 H/H 10.3/34.4 Platelets 38  Vit C WNL Vit D WNL  Folate low B12 WNL  Resolved problem list:  Hypokalemia  Assessment and Plan   Acute metabolic encephalopathy, presumed related to hyperammonemia, although cause of latter unclear. Brain MRI without acute findings. 6/13 EEG showed no seizures, same on 6/16.  Hypernatremia could be contributing, but is unlikely to fully explain her findings.  TSH WNL.  -con't holding sedation -con't carnitine and checking ammonia levels -carnitine level from pre-supplementation  pending -repeat head CT-- with thrombocytopenia she is at increased bleeding risk, and ammonia level no longer fully explains her mental status -Continue rifaximin twice daily & lactulose  3 times daily -lead level pending -vit E, B6, B1, zinc pending from 6/16  Increased ammonia level, no evidence of cirrhosis on imaging or by LFT's. Not on medications susceptible to cause elevated ammonia. Question carnitine deficiency? Trying to determine if another vitamin deficiency can cause this.  Copious stool output with lactulose , but ammonia responding -Continue IV carnitine - Continue rifaximin and lactulose  -carnitine level pending from 6/16 -Follow ammonia level daily -Reviewed [facility] records-- had severe protein energy malnutrition with refeeding syndrome during that admission, was hospitalized 7 days. Had negative LP, EEGs, had DI that was treated with DDAVP. Ammonia was never checked during that admission.   Acute respiratory failure with hypoxia requiring MV -LTV - VAP prevention protocol - PEEP protocol for sedation - Continue efforts at daily SBT; mental status currently precludes extubation - Add bronchodilators today  Leukocytosis, worsening.  No fevers. -Collect trach aspirate culture today - Monitor -No foley or central lines  AGMA; bicarb dropped again after infusion stopped- potentially due to diarrhea. LA normal x 2 on 6/16. -Continue enteral bicarb  AKI on chronic kidney disease (CKD) stage G3b/A2> improving Hypervolemia -Lasix - Decrease free water - Renally dose meds and avoid nephrotoxic meds  Hypernatremia, resolved -Free water flushes decreased to 200 cc every 4 hours -Monitor  Thrombocytopenia; 4T score= 5; intermediate probability of HIT. Also potentially due to antibiotics (ceftriaxone completed 6/17) -PF4 Ab & SRA pending from 6/18 -Hold chemical DVT prophylaxis  CAD with NSTEMI (non-ST elevated myocardial infarction) ; LHC demonstrated chronic  occlusion of LAD HFrEF> not sure this was all acute- concern for stress induced CM vs chronic ICM -Likely demand related based on absence of new lesions.  -con't supportive care -defer GDMT currently with AKI -Continue aspirin  and statin -monitor on tele -Appreciate cardiology's management, can reengage later this admission when she is closer to discharge  Hypothyroidism -Continue Synthroid   Hyperglycemia, remains uncontrolled off D5 -Adding tube feed coverage 3 units every 4 hours - Increase glargine 15 units -SSI as needed, resistant scale - Goal blood glucose 140 and 180  Mother was updated at bedside yesterday afternoon. No family at bedside this morning.    Best Practice (right click and Reselect all SmartList Selections daily)   Diet/type: NPO - tube feeds.  DVT prophylaxis SCD Pressure ulcer(s): N/A- defer to RN assessment GI prophylaxis: H2B Lines: N/A Foley:  N/A Code Status:  full code Last date of multidisciplinary goals of care discussion [mother and sister updated 6/16]   This patient is critically ill with multiple organ system failure which requires frequent high complexity decision making, assessment, support, evaluation, and titration of therapies. This was completed through the application of advanced monitoring technologies and extensive interpretation of multiple databases. During this encounter critical care time was devoted to patient care services described in this note for 34 minutes.  Joesph Mussel, DO 08/07/23 9:06 AM [facility]  For contact information, see Amion. If no response to pager, please call PCCM consult pager. After hours, 7PM- 7AM, please call Elink.

## 2023-08-07 NOTE — Progress Notes (Signed)
 CARDIAC REHAB PHASE I    Referral for CRP2 sent to [place] per protocol. Pt remains intubated and critically ill. CRP1 will sign off today.  Ronny Colas, RN BSN 08/07/2023 7:33 AM

## 2023-08-07 NOTE — Progress Notes (Signed)
 Mother updated at bedside this afternoon after CT results.   Joesph Mussel, DO 08/07/23 11:51 AM [facility]  For contact information, see Amion. If no response to pager, please call PCCM consult pager. After hours, 7PM- 7AM, please call Elink.

## 2023-08-08 ENCOUNTER — Encounter: Payer: Self-pay | Admitting: Internal Medicine

## 2023-08-08 DIAGNOSIS — E440 Moderate protein-calorie malnutrition: Secondary | ICD-10-CM

## 2023-08-08 DIAGNOSIS — G9341 Metabolic encephalopathy: Secondary | ICD-10-CM | POA: Diagnosis not present

## 2023-08-08 DIAGNOSIS — I5021 Acute systolic (congestive) heart failure: Secondary | ICD-10-CM | POA: Diagnosis not present

## 2023-08-08 DIAGNOSIS — N179 Acute kidney failure, unspecified: Secondary | ICD-10-CM | POA: Diagnosis not present

## 2023-08-08 DIAGNOSIS — I214 Non-ST elevation (NSTEMI) myocardial infarction: Secondary | ICD-10-CM | POA: Diagnosis not present

## 2023-08-08 DIAGNOSIS — D72829 Elevated white blood cell count, unspecified: Secondary | ICD-10-CM

## 2023-08-08 DIAGNOSIS — N1831 Chronic kidney disease, stage 3a: Secondary | ICD-10-CM

## 2023-08-08 LAB — CBC
HCT: 33.4 % — ABNORMAL LOW (ref 36.0–46.0)
Hemoglobin: 10.2 g/dL — ABNORMAL LOW (ref 12.0–15.0)
MCH: 26.5 pg (ref 26.0–34.0)
MCHC: 30.5 g/dL (ref 30.0–36.0)
MCV: 86.8 fL (ref 80.0–100.0)
Platelets: 45 10*3/uL — ABNORMAL LOW (ref 150–400)
RBC: 3.85 MIL/uL — ABNORMAL LOW (ref 3.87–5.11)
RDW: 19.8 % — ABNORMAL HIGH (ref 11.5–15.5)
WBC: 19.4 10*3/uL — ABNORMAL HIGH (ref 4.0–10.5)
nRBC: 0 % (ref 0.0–0.2)

## 2023-08-08 LAB — COMPREHENSIVE METABOLIC PANEL WITH GFR
ALT: 20 U/L (ref 0–44)
AST: 30 U/L (ref 15–41)
Albumin: 1.9 g/dL — ABNORMAL LOW (ref 3.5–5.0)
Alkaline Phosphatase: 71 U/L (ref 38–126)
Anion gap: 10 (ref 5–15)
BUN: 24 mg/dL — ABNORMAL HIGH (ref 8–23)
CO2: 23 mmol/L (ref 22–32)
Calcium: 8.4 mg/dL — ABNORMAL LOW (ref 8.9–10.3)
Chloride: 116 mmol/L — ABNORMAL HIGH (ref 98–111)
Creatinine, Ser: 1.17 mg/dL — ABNORMAL HIGH (ref 0.44–1.00)
GFR, Estimated: 52 mL/min — ABNORMAL LOW (ref >60)
Glucose, Bld: 200 mg/dL — ABNORMAL HIGH (ref 70–99)
Potassium: 4.2 mmol/L (ref 3.5–5.1)
Sodium: 149 mmol/L — ABNORMAL HIGH (ref 135–145)
Total Bilirubin: 0.4 mg/dL (ref 0.0–1.2)
Total Protein: 5.1 g/dL — ABNORMAL LOW (ref 6.5–8.1)

## 2023-08-08 LAB — GLUCOSE, CAPILLARY
Glucose-Capillary: 184 mg/dL — ABNORMAL HIGH (ref 70–99)
Glucose-Capillary: 202 mg/dL — ABNORMAL HIGH (ref 70–99)
Glucose-Capillary: 150 mg/dL — ABNORMAL HIGH (ref 70–99)
Glucose-Capillary: 127 mg/dL — ABNORMAL HIGH (ref 70–99)
Glucose-Capillary: 142 mg/dL — ABNORMAL HIGH (ref 70–99)
Glucose-Capillary: 203 mg/dL — ABNORMAL HIGH (ref 70–99)

## 2023-08-08 LAB — AMMONIA: Ammonia: 42 umol/L — ABNORMAL HIGH (ref 9–35)

## 2023-08-08 LAB — MAGNESIUM: Magnesium: 2.1 mg/dL (ref 1.7–2.4)

## 2023-08-08 LAB — PHOSPHORUS: Phosphorus: 4 mg/dL (ref 2.5–4.6)

## 2023-08-08 MED ORDER — DEXMEDETOMIDINE HCL IN NACL 400 MCG/100ML IV SOLN
0.0000 ug/kg/h | INTRAVENOUS | Status: DC
  Administered 2023-08-08: 0.4 ug/kg/h via INTRAVENOUS

## 2023-08-08 MED ORDER — ADULT MULTIVITAMIN W/MINERALS CH: 1.0000 | ORAL_TABLET | Freq: Every day | ORAL | Status: DC

## 2023-08-08 MED ORDER — CARVEDILOL 3.125 MG PO TABS
3.1250 mg | ORAL_TABLET | Freq: Two times a day (BID) | ORAL | Status: DC
  Administered 2023-08-08 – 2023-08-10 (×6): 3.125 mg
  Filled 2023-08-08 (×6): qty 1

## 2023-08-08 MED ORDER — ZINC SULFATE 220 (50 ZN) MG PO CAPS
220.0000 mg | ORAL_CAPSULE | Freq: Every day | ORAL | Status: DC
  Administered 2023-08-08 – 2023-08-11 (×4): 220 mg
  Filled 2023-08-08 (×4): qty 1

## 2023-08-08 MED ORDER — SODIUM BICARBONATE 650 MG PO TABS
650.0000 mg | ORAL_TABLET | Freq: Once | ORAL | Status: AC
  Administered 2023-08-08: 650 mg
  Filled 2023-08-08: qty 1

## 2023-08-08 MED ORDER — DEXMEDETOMIDINE HCL IN NACL 400 MCG/100ML IV SOLN
INTRAVENOUS | Status: AC
  Filled 2023-08-08: qty 100

## 2023-08-08 MED ORDER — PANCRELIPASE (LIP-PROT-AMYL) 10440-39150 UNITS PO TABS
20880.0000 [IU] | ORAL_TABLET | Freq: Once | ORAL | Status: AC
  Administered 2023-08-08: 20880 [IU]
  Filled 2023-08-08: qty 2

## 2023-08-08 MED ORDER — ORAL CARE MOUTH RINSE
15.0000 mL | OROMUCOSAL | Status: DC
  Administered 2023-08-08 – 2023-09-01 (×123): 15 mL via OROMUCOSAL

## 2023-08-08 NOTE — Progress Notes (Signed)
 eLink Physician-Brief Progress Note Patient Name: Sarah Davis DOB: [birth year] MRN: [id number]   Date of Service  08/08/2023  HPI/Events of Note  63 year old female with a history of polysubstance abuse, tobacco use, and bipolar disease who was intubated in respiratory failure and has been off of sedation since 6/15 with suspected hepatic encephalopathy.  Tonight, she is spontaneously awakening, diaphoretic, lifting her head off the pillow but not following commands.  On spontaneous breathing trial, she was breathing without difficulty on 10/+5/30%.  Have been avoiding sedation to allow her to wake up  eICU Interventions  Although she is more aroused now, she does appear like she is in distress.  She is still tachypneic, now hypertensive, and diaphoretic.  Unfortunately, she is not a great candidate for extubation at this hour.  For now, we will try low-dose Precedex and discontinue it by 5 AM for her distress and reattempt spontaneous awakening/breathing trial this morning.  Will leave her on pressure support for now.     Intervention Category Minor Interventions: Agitation / anxiety - evaluation and management  Taunja Brickner 08/08/2023, 1:24 AM

## 2023-08-08 NOTE — TOC CM/SW Note (Signed)
 Patient remains in ICU. Just extubated.  TOC will continue to follow for needs.

## 2023-08-08 NOTE — Progress Notes (Signed)
 Rounding Note   Patient Name: Sarah Davis Date of Encounter: 08/08/2023  [facility] Cardiologist: None   Subjective Creatinine 1.17.  BP 139/73.  Intubated, off sedation, not following commands   Scheduled Meds:  arformoterol  15 mcg Nebulization BID   aspirin   81 mg Per Tube Daily   atorvastatin   80 mg Per Tube Daily   Chlorhexidine Gluconate Cloth  6 each Topical Daily   docusate  100 mg Per Tube BID   famotidine  20 mg Per Tube Daily   folic acid  1 mg Per Tube Daily   free water  200 mL Per Tube Q4H   insulin  aspart  0-15 Units Subcutaneous Q4H   insulin  aspart  3 Units Subcutaneous Q4H   insulin  glargine-yfgn  15 Units Subcutaneous Daily   lactulose   30 g Per Tube TID   levothyroxine   100 mcg Per Tube q morning   multivitamin with minerals  1 tablet Per Tube BID   mouth rinse  15 mL Mouth Rinse Q2H   polyethylene glycol  17 g Per Tube Daily   revefenacin  175 mcg Nebulization Daily   rifaximin  550 mg Per Tube BID   sodium bicarbonate  1,300 mg Oral Q6H   Continuous Infusions:  dexmedetomidine (PRECEDEX) IV infusion Stopped (08/08/23 0325)   feeding supplement (GLUCERNA 1.2 CAL) 55 mL/hr at 08/08/23 0700   levOCARNitine (CARNITOR) 900 mg in sodium chloride  0.9 % 250 mL (3.6 mg/mL) IVPB Stopped (08/08/23 0455)   PRN Meds: acetaminophen , albuterol, nitroGLYCERIN , ondansetron  (ZOFRAN ) IV, mouth rinse, mouth rinse, sodium chloride  flush   Vital Signs  Vitals:   08/08/23 0615 08/08/23 0630 08/08/23 0645 08/08/23 0700  BP: (!) 119/58 120/73 139/73   Pulse: 74 81 86 83  Resp: 18 20 (!) 25 (!) 23  Temp:      TempSrc:      SpO2: 99% 97% 98% 98%  Weight:      Height:        Intake/Output Summary (Last 24 hours) at 08/08/2023 0751 Last data filed at 08/08/2023 0700 Gross per 24 hour  Intake 3700.92 ml  Output 3625 ml  Net 75.92 ml      08/07/2023    7:05 AM 08/06/2023    5:54 AM 08/05/2023    5:00 AM  Last 3 Weights  Weight (lbs) 130 lb 15.3 oz 134  lb 4.2 oz 125 lb 10.6 oz  Weight (kg) 59.4 kg 60.9 kg 57 kg      Telemetry NSR - Personally Reviewed  ECG  No new ECG - Personally Reviewed  Physical Exam  GEN: Intubated Neck: No JVD Cardiac: RRR, no murmurs, rubs, or gallops.  Respiratory: Mechanical breath sounds GI: Soft MS: No edema Neuro:  not following commands Psych: Unable  to assess  Labs High Sensitivity Troponin:   Recent Labs  Lab 07/30/23 2017  TROPONINIHS 1,235*     Chemistry Recent Labs  Lab 08/06/23 0434 08/07/23 0414 08/08/23 0409  NA 150* 143 149*  K 4.0 3.2* 4.2  CL 123* 117* 116*  CO2 16* 20* 23  GLUCOSE 289* 297* 200*  BUN 32* 21 24*  CREATININE 1.17* 0.86 1.17*  CALCIUM  7.7* 7.7* 8.4*  MG 2.2 2.0 2.1  PROT 4.7* 4.4* 5.1*  ALBUMIN 2.2* 1.8* 1.9*  AST 20 25 30   ALT 16 18 20   ALKPHOS 77 67 71  BILITOT 0.4 0.4 0.4  GFRNONAA 52* >60 52*  ANIONGAP 11 6 10     Lipids  Recent Labs  Lab 08/05/23 0546  TRIG 166*    Hematology Recent Labs  Lab 08/06/23 0434 08/07/23 0414 08/08/23 0409  WBC 11.9* 16.9* 19.4*  RBC 3.95 3.82* 3.85*  HGB 10.6* 10.3* 10.2*  HCT 35.1* 34.4* 33.4*  MCV 88.9 90.1 86.8  MCH 26.8 27.0 26.5  MCHC 30.2 29.9* 30.5  RDW 19.6* 19.6* 19.8*  PLT 60* 38* 45*   Thyroid  Recent Labs  Lab 08/01/23 1039  TSH 0.717    BNPNo results for input(s): BNP, PROBNP in the last 168 hours.  DDimer No results for input(s): DDIMER in the last 168 hours.   Radiology  VAS US  UPPER EXTREMITY VENOUS DUPLEX Result Date: 08/07/2023 UPPER VENOUS STUDY  Patient Name:  ALBA PERILLO  Date of Exam:   08/07/2023 Medical Rec #: [id number]  Accession #:    [id number] Date of Birth: 06-Jan-1961  Patient Gender: F Patient Age:   63 years Exam Location:  [facility] Procedure:      VAS US  UPPER EXTREMITY VENOUS DUPLEX Referring Phys: Jaquita Merl --------------------------------------------------------------------------------  Indications: Edema Limitations: Poor ultrasound/tissue  interface, edema, line, bandages, and open wound. Comparison Study: No prior study Performing Technologist: Carleene Chase RVS  Examination Guidelines: A complete evaluation includes B-mode imaging, spectral Doppler, color Doppler, and power Doppler as needed of all accessible portions of each vessel. Bilateral testing is considered an integral part of a complete examination. Limited examinations for reoccurring indications may be performed as noted.  Right Findings: +----------+------------+---------+-----------+----------+-----------------+ RIGHT     CompressiblePhasicitySpontaneousProperties     Summary      +----------+------------+---------+-----------+----------+-----------------+ IJV           Full       Yes       Yes                                +----------+------------+---------+-----------+----------+-----------------+ Subclavian               Yes       Yes                                +----------+------------+---------+-----------+----------+-----------------+ Axillary      Full       Yes       Yes                                +----------+------------+---------+-----------+----------+-----------------+ Brachial      Full       Yes       Yes                                +----------+------------+---------+-----------+----------+-----------------+ Radial                   Yes       Yes                                +----------+------------+---------+-----------+----------+-----------------+ Ulnar                    Yes       Yes                                +----------+------------+---------+-----------+----------+-----------------+  Cephalic                                             Not visualized   +----------+------------+---------+-----------+----------+-----------------+ Basilic       None                                  Age Indeterminate +----------+------------+---------+-----------+----------+-----------------+  Left  Findings: +----------+------------+---------+-----------+----------+-----------------+ LEFT      CompressiblePhasicitySpontaneousProperties     Summary      +----------+------------+---------+-----------+----------+-----------------+ IJV           Full       Yes       Yes                                +----------+------------+---------+-----------+----------+-----------------+ Subclavian    Full       Yes       Yes                                +----------+------------+---------+-----------+----------+-----------------+ Axillary      Full       Yes       Yes                                +----------+------------+---------+-----------+----------+-----------------+ Brachial      Full       Yes       Yes                                +----------+------------+---------+-----------+----------+-----------------+ Radial        Full       Yes       Yes                                +----------+------------+---------+-----------+----------+-----------------+ Ulnar                    Yes       Yes                                +----------+------------+---------+-----------+----------+-----------------+ Cephalic                                             Not visualized   +----------+------------+---------+-----------+----------+-----------------+ Basilic       None       No        No               Age Indeterminate +----------+------------+---------+-----------+----------+-----------------+  Summary:  Right: No evidence of deep vein thrombosis in the upper extremity. Findings consistent with age indeterminate superficial vein thrombosis involving the right basilic vein.  Left: No evidence of deep vein thrombosis in the upper extremity. Findings consistent with age indeterminate superficial vein thrombosis involving the left basilic vein.  *See table(s) above for measurements and observations.  Diagnosing physician:  Runell Countryman Electronically signed by  Runell Countryman on 08/07/2023 at 5:44:21 PM.    Final    DG CHEST PORT 1 VIEW Result Date: 08/07/2023 CLINICAL DATA:  Leukocytosis. EXAM: PORTABLE CHEST 1 VIEW COMPARISON:  AP radiograph chest dated August 01 2023. FINDINGS: The heart is normal in size. Pulmonary vasculature is unremarkable. There are hazy and reticular opacities in the left lung base. The right lung appears clear. An endotracheal tube and feeding tube remain in place. IMPRESSION: 1. Hazy, reticular left basilar opacification/consolidation. Electronically Signed   By: Maribeth Shivers M.D.   On: 08/07/2023 10:47   CT HEAD WO CONTRAST ( ) Result Date: 08/07/2023 CLINICAL DATA:  Encephalopathy. EXAM: CT HEAD WITHOUT CONTRAST TECHNIQUE: Contiguous axial images were obtained from the base of the skull through the vertex without intravenous contrast. RADIATION DOSE REDUCTION: This exam was performed according to the departmental dose-optimization program which includes automated exposure control, adjustment of the mA and/or kV according to patient size and/or use of iterative reconstruction technique. COMPARISON:  CT of the head dated 04/03/2023. FINDINGS: Brain: Mild focal encephalomalacia changes within the left frontal and parietal lobes related to chronic cortical infarcts. Chronic lacunar infarct in left basal ganglia. Mild subcortical white matter disease. No evidence of hemorrhage, mass, acute cortical infarct or hydrocephalus. Vascular: Atheromatous calcifications within the carotid siphons. Skull: Intact and normally mineralized.  No lesions are present. Sinuses/Orbits: Negative. Other: None. IMPRESSION: 1. Chronic focal encephalomalacia changes within the left frontal and parietal lobes and chronic ischemic small vessel disease. No apparent acute process. Electronically Signed   By: Maribeth Shivers M.D.   On: 08/07/2023 10:46    Cardiac Studies   Patient Profile   63 y.o. female with CAD, hypertension, tobacco use, hyperlipidemia,  schizophrenia/bipolar disorder who was admitted with NSTEMI.  Assessment & Plan   NSTEMI: Has history of STEMI with stenting of distal LCx, RCA in 2021 post complicated by V-fib arrest.  She presented to [facility] on 6[facility]9 with altered mental status, found to have troponin elevation.  Echocardiogram at [facility] showed EF 35 to 40%.  She was transferred to [facility] for cath.  Cath on 6/12 showed patent LCx and RCA stents, chronic occlusion in mid LAD with collaterals, medical management recommended - Suspect demand ischemia (vs Takotsubo given wall motion abnormality). Continue aspirin , statin  Acute systolic heart failure: Echocardiogram at [facility] with EF 35 to 40%.  Echo repeated on 6/13 and showed EF 45 to 50% with apical hypokinesis - Appears euvolemic.  Worsening creatinine and sodium with diuresis yesterday, would hold further diuresis - Hypotensive following intubation, initially required levophed but has now been off pressors for days.  Will add carvedilol.  Can plan to add additional GDMT once extubated if stable BP and renal function  AMS: Worsening encephalopathy, required intubation for airway protection on 6/13.  Neurology consulted, no acute abnormalities on head CT or brain MRI.  Workup showed significantly elevated ammonia, suspect contributing to encephalopathy, though unclear cause as no evidence of cirrhosis on imaging.  Started on lactulose  and rifaximin per PCCM  Acute respiratory failure: Intubated, management per PCCM  AKI: Creatinine up to 1.93 on 6/13.  She has been given IV fluids, creatinine improved. Cr 1.17 today  Cardiology will follow from a distance.  Can plan to add further GDMT once extubated if stable BP and renal function    For questions or updates, please contact [name] Please consult [email or web address] for contact info under     Signed, Veryl Gottron  Alica Antu, MD  08/08/2023, 7:51 AM

## 2023-08-08 NOTE — Progress Notes (Addendum)
 NAME:  Sarah Davis, MRN:  [id number], DOB:  1960-11-23, LOS: 9 ADMISSION DATE:  07/30/2023, CONSULTATION DATE: 08/01/2023 REFERRING MD: Cardiology, CHIEF COMPLAINT: Acute respiratory distress  History of Present Illness:  63 year old female who underwent cardiac catheterization 07/31/2023 without acute interventions required.  She does have a history of polysubstance abuse, tobacco abuse, bipolar, thyroid disease and at the time of pulmonary critical care arriving at her bedside she was in acute respiratory failure and required intubation emergently.  She transferred to the intensive care unit.  She is currently on Levophed drip.  Chest x-ray, ABG, CT of the head have been ordered at this time.  Pertinent  Medical History   Past Medical History:  Diagnosis Date   Anxiety    Bipolar 2 disorder (HCC)    Chronic hepatitis C virus genotype 1 infection (HCC)    Chronic pancreatitis (HCC)    CKD (chronic kidney disease) stage 4, GFR 15-29 ml/min (HCC)    COPD (chronic obstructive pulmonary disease) (HCC)    Coronary artery disease 11/2019   s/p PTCI  LCx & RCA. Complicated by Vfib arrest   DM (diabetes mellitus), type 2 (HCC)    HFrEF (heart failure with reduced ejection fraction) (HCC) 08/01/2023   EF 45-50%   Hyperparathyroidism, secondary (HCC)    Hypothyroid    Lithium toxicity    Marijuana abuse    NSTEMI (non-ST elevated myocardial infarction) (HCC) 07/30/2023   med management   Peripheral autonomic neuropathy due to DM [facility])    STEMI (ST elevation myocardial infarction) (HCC) 2021   Stroke [facility])    right hemiparesis   Substance abuse (HCC)    Tardive dyskinesia    Tobacco abuse    Significant Hospital Events: Including procedures, antibiotic start and stop dates in addition to other pertinent events   6/12 - CT negative for LVO, MRI negative. No seizures on EEG.  6/14 remains somnolent.  6/16 unresponsive, ammonia back in 250 range; started carnitine, increased lactulose  6/17  ammonia back in 70s, still minimally responsive, remaining off sedation   Interim History / Subjective:  Patient was on Precedex overnight for few hours because of agitation, currently off Now she opens eyes, not following commands but moving all 4 extremities with painful stimuli Remain afebrile  Objective    Blood pressure 132/66, pulse 84, temperature (!) 96 F (35.6 C), resp. rate (!) 24, height 5' 5 (1.651 m), weight 59.4 kg, SpO2 98%.    Vent Mode: CPAP;PSV FiO2 (%):  [30 %] 30 % Set Rate:  [24 bmp] 24 bmp Vt Set:  [450 mL] 450 mL PEEP:  [5 cmH20] 5 cmH20 Pressure Support:  [10 cmH20] 10 cmH20 Plateau Pressure:  [13 cmH20-14 cmH20] 14 cmH20   Intake/Output Summary (Last 24 hours) at 08/08/2023 1129 Last data filed at 08/08/2023 0800 Gross per 24 hour  Intake 3283.34 ml  Output 3275 ml  Net 8.34 ml   Filed Weights   08/05/23 0500 08/06/23 0554 08/07/23 0705  Weight: 57 kg 60.9 kg 59.4 kg    Examination: General: Crtitically ill-appearing female, orally intubated HEENT: [place]/AT, eyes anicteric.  ETT and cortrak in place Neuro: Opens eyes with vocal stimuli, not following commands, withdrawing in all 4 extremities Chest: Coarse breath sounds, no wheezes or rhonchi Heart: Regular rate and rhythm, no murmurs or gallops Abdomen: Soft, nondistended, bowel sounds present  Ammonia 42 Na+  149 K+ 4.2 Bicarb 23 BUN 24 Cr 1.17 WBC 19.4 H/H 10.2/33.4 Platelets 45  Vit C WNL  Vit D WNL Folate low B12 WNL  Resolved problem list:  Hypokalemia  Assessment and Plan  Acute metabolic encephalopathy, presumed related to hyperammonemia Hyperammonemia with unclear etiology, no evidence of liver cirrhosis on imaging Possible carnitine deficiency Brain MRI without acute findings. 6/13 EEG showed no seizures, same on 6/16 CT head was done on 6/19 no acute changes Hypernatremia could be contributing, but is unlikely to fully explain her findings.  TSH WNL.  Avoid  sedation Presupplementation carnitine level is pending Continue levocarnitine supplementation Stop rifaximin Continue lactulose  for now Patient still does not follow commands but opens eyes, withdraws in all 4 extremities Trend ammonia level  Acute respiratory failure with hypoxia requiring MV Continue lung protective ventilation VAP prevention bundle in place Tolerating spontaneous breathing trial Will try to extubate her today  Leukocytosis, worsening.  No fevers Cultures have been negative Keep off antibiotics No central line or Foley  AKI on chronic kidney disease (CKD) stage 3A Hypernatremia Patient serum creatinine went up likely in the setting of diuretics Discontinue diuresis Continue free water flushes Monitor electrolytes   Thrombocytopenia likely due to critical illness;  HIT was ruled out  CAD with acute NSTEMI (non-ST elevated myocardial infarction) ; LHC demonstrated chronic occlusion of LAD HFrEF> not sure this was all acute- concern for stress induced CM vs chronic ICM Likely demand related based on absence of new lesions.  Appreciate cardiology follow-up Started on Coreg Continue aspirin  and statin GDMT as tolerated  Hypothyroidism Continue Synthroid   Moderate malnutrition Continue dietary supplements   Best Practice (right click and Reselect all SmartList Selections daily)   Diet/type: NPO -hold tube feeds for possible extubation DVT prophylaxis SCD Pressure ulcer(s): defer to RN assessment GI prophylaxis: H2B Lines: N/A Foley:  N/A Code Status:  full code Last date of multidisciplinary goals of care discussion: 6/20 patient's sister and mother both were updated at bedside   The patient is critically ill due to acute metabolic encephalopathy/acute respiratory failure with hypoxia.  Critical care was necessary to treat or prevent imminent or life-threatening deterioration.  Critical care was time spent personally by me on the following  activities: development of treatment plan with patient and/or surrogate as well as nursing, discussions with consultants, evaluation of patient's response to treatment, examination of patient, obtaining history from patient or surrogate, ordering and performing treatments and interventions, ordering and review of laboratory studies, ordering and review of radiographic studies, pulse oximetry, re-evaluation of patient's condition and participation in multidisciplinary rounds.   During this encounter critical care time was devoted to patient care services described in this note for 37 minutes.     Trevor Fudge, MD [facility] See Amion for pager If no response to pager, please call [phone] until 7pm After 7pm, Please call E-link [phone]

## 2023-08-08 NOTE — Procedures (Signed)
 Extubation Procedure Note  Patient Details:   Name: Sarah Davis DOB: November 04, 1960 MRN: [id number]   Airway Documentation:    Vent end date: 08/08/23 Vent end time: 1255   Evaluation  O2 sats: stable throughout Complications: No apparent complications Patient did tolerate procedure well. Bilateral Breath Sounds: Clear   No patient is not able to speak her name due to not following commands.  Patient was extubated to a 4L [place] without any complications, dyspnea or stridor noted. Positive cuff leak prior to extubation.     Worley Headings 08/08/2023, 12:55 PM

## 2023-08-09 DIAGNOSIS — I214 Non-ST elevation (NSTEMI) myocardial infarction: Secondary | ICD-10-CM | POA: Diagnosis not present

## 2023-08-09 DIAGNOSIS — E7220 Disorder of urea cycle metabolism, unspecified: Secondary | ICD-10-CM | POA: Diagnosis not present

## 2023-08-09 DIAGNOSIS — N1831 Chronic kidney disease, stage 3a: Secondary | ICD-10-CM | POA: Diagnosis not present

## 2023-08-09 DIAGNOSIS — D696 Thrombocytopenia, unspecified: Secondary | ICD-10-CM

## 2023-08-09 DIAGNOSIS — I5021 Acute systolic (congestive) heart failure: Secondary | ICD-10-CM

## 2023-08-09 DIAGNOSIS — D72829 Elevated white blood cell count, unspecified: Secondary | ICD-10-CM | POA: Diagnosis not present

## 2023-08-09 LAB — CBC
HCT: 32.3 % — ABNORMAL LOW (ref 36.0–46.0)
Hemoglobin: 10 g/dL — ABNORMAL LOW (ref 12.0–15.0)
MCH: 27 pg (ref 26.0–34.0)
MCHC: 31 g/dL (ref 30.0–36.0)
MCV: 87.1 fL (ref 80.0–100.0)
Platelets: 66 10*3/uL — ABNORMAL LOW (ref 150–400)
RBC: 3.71 MIL/uL — ABNORMAL LOW (ref 3.87–5.11)
RDW: 19.7 % — ABNORMAL HIGH (ref 11.5–15.5)
WBC: 18.1 10*3/uL — ABNORMAL HIGH (ref 4.0–10.5)
nRBC: 0 % (ref 0.0–0.2)

## 2023-08-09 LAB — GLUCOSE, CAPILLARY
Glucose-Capillary: 220 mg/dL — ABNORMAL HIGH (ref 70–99)
Glucose-Capillary: 210 mg/dL — ABNORMAL HIGH (ref 70–99)
Glucose-Capillary: 220 mg/dL — ABNORMAL HIGH (ref 70–99)
Glucose-Capillary: 222 mg/dL — ABNORMAL HIGH (ref 70–99)
Glucose-Capillary: 179 mg/dL — ABNORMAL HIGH (ref 70–99)
Glucose-Capillary: 180 mg/dL — ABNORMAL HIGH (ref 70–99)

## 2023-08-09 LAB — COMPREHENSIVE METABOLIC PANEL WITH GFR
ALT: 88 U/L — ABNORMAL HIGH (ref 0–44)
AST: 196 U/L — ABNORMAL HIGH (ref 15–41)
Albumin: 1.7 g/dL — ABNORMAL LOW (ref 3.5–5.0)
Alkaline Phosphatase: 89 U/L (ref 38–126)
Anion gap: 7 (ref 5–15)
BUN: 25 mg/dL — ABNORMAL HIGH (ref 8–23)
CO2: 25 mmol/L (ref 22–32)
Calcium: 8.5 mg/dL — ABNORMAL LOW (ref 8.9–10.3)
Chloride: 118 mmol/L — ABNORMAL HIGH (ref 98–111)
Creatinine, Ser: 1.28 mg/dL — ABNORMAL HIGH (ref 0.44–1.00)
GFR, Estimated: 47 mL/min — ABNORMAL LOW (ref >60)
Glucose, Bld: 196 mg/dL — ABNORMAL HIGH (ref 70–99)
Potassium: 3.5 mmol/L (ref 3.5–5.1)
Sodium: 150 mmol/L — ABNORMAL HIGH (ref 135–145)
Total Bilirubin: 0.3 mg/dL (ref 0.0–1.2)
Total Protein: 5.1 g/dL — ABNORMAL LOW (ref 6.5–8.1)

## 2023-08-09 LAB — VITAMIN B6: Vitamin B6: 1.9 ug/L — ABNORMAL LOW (ref 3.4–65.2)

## 2023-08-09 LAB — PHOSPHORUS: Phosphorus: 3.7 mg/dL (ref 2.5–4.6)

## 2023-08-09 LAB — MAGNESIUM: Magnesium: 2.2 mg/dL (ref 1.7–2.4)

## 2023-08-09 LAB — AMMONIA: Ammonia: 23 umol/L (ref 9–35)

## 2023-08-09 MED ORDER — INSULIN ASPART 100 UNIT/ML IJ SOLN
5.0000 [IU] | INTRAMUSCULAR | Status: DC
  Administered 2023-08-09 – 2023-08-19 (×60): 5 [IU] via SUBCUTANEOUS

## 2023-08-09 MED ORDER — HEPARIN SODIUM (PORCINE) 5000 UNIT/ML IJ SOLN
5000.0000 [IU] | Freq: Three times a day (TID) | INTRAMUSCULAR | Status: DC
  Administered 2023-08-09 – 2023-08-19 (×31): 5000 [IU] via SUBCUTANEOUS
  Filled 2023-08-09 (×31): qty 1

## 2023-08-09 MED ORDER — SODIUM BICARBONATE 650 MG PO TABS
1300.0000 mg | ORAL_TABLET | Freq: Two times a day (BID) | ORAL | Status: DC
  Administered 2023-08-09 – 2023-08-10 (×2): 1300 mg via ORAL
  Filled 2023-08-09: qty 2

## 2023-08-09 MED ORDER — LORAZEPAM 2 MG/ML IJ SOLN
2.0000 mg | Freq: Once | INTRAMUSCULAR | Status: AC
  Administered 2023-08-09: 2 mg via INTRAVENOUS
  Filled 2023-08-09: qty 1

## 2023-08-09 MED ORDER — FREE WATER
300.0000 mL | Status: DC
  Administered 2023-08-09 – 2023-08-11 (×12): 300 mL

## 2023-08-09 NOTE — Progress Notes (Addendum)
 NAME:  Sarah Davis, MRN:  [id number], DOB:  [birth year], LOS: 10 ADMISSION DATE:  07/30/2023, CONSULTATION DATE: 08/01/2023 REFERRING MD: Cardiology, CHIEF COMPLAINT: Acute respiratory distress  History of Present Illness:  63 year old female who underwent cardiac catheterization 07/31/2023 without acute interventions required.  She does have a history of polysubstance abuse, tobacco abuse, bipolar, thyroid disease and at the time of pulmonary critical care arriving at her bedside she was in acute respiratory failure and required intubation emergently.  She transferred to the intensive care unit.  She is currently on Levophed  drip.  Chest x-ray, ABG, CT of the head have been ordered at this time.  Pertinent  Medical History   Past Medical History:  Diagnosis Date   Anxiety    Bipolar 2 disorder (HCC)    Chronic hepatitis C virus genotype 1 infection (HCC)    Chronic pancreatitis (HCC)    CKD (chronic kidney disease) stage 4, GFR 15-29 ml/min (HCC)    COPD (chronic obstructive pulmonary disease) (HCC)    Coronary artery disease 11/2019   s/p PTCI  LCx & RCA. Complicated by Vfib arrest   DM (diabetes mellitus), type 2 (HCC)    HFrEF (heart failure with reduced ejection fraction) (HCC) 08/01/2023   EF 45-50%   Hyperparathyroidism, secondary (HCC)    Hypothyroid    Lithium toxicity    Marijuana abuse    NSTEMI (non-ST elevated myocardial infarction) (HCC) 07/30/2023   med management   Peripheral autonomic neuropathy due to DM [facility])    STEMI (ST elevation myocardial infarction) (HCC) 2021   Stroke [facility])    right hemiparesis   Substance abuse (HCC)    Tardive dyskinesia    Tobacco abuse    Significant Hospital Events: Including procedures, antibiotic start and stop dates in addition to other pertinent events   6/12 - CT negative for LVO, MRI negative. No seizures on EEG.  6/14 remains somnolent.  6/16 unresponsive, ammonia back in 250 range; started carnitine, increased lactulose  6/17  ammonia back in 70s, still minimally responsive, remaining off sedation   Interim History / Subjective:  Extubated yesterday. Does not track  Objective    Blood pressure 130/67, pulse 77, temperature 99 F (37.2 C), temperature source Oral, resp. rate (!) 26, height 5' 5 (1.651 m), weight 59.4 kg, SpO2 93%.    Vent Mode: CPAP;PSV FiO2 (%):  [30 %] 30 % PEEP:  [5 cmH20] 5 cmH20 Pressure Support:  [10 cmH20] 10 cmH20   Intake/Output Summary (Last 24 hours) at 08/09/2023 0818 Last data filed at 08/09/2023 0700 Gross per 24 hour  Intake 2915.49 ml  Output 850 ml  Net 2065.49 ml   Filed Weights   08/05/23 0500 08/06/23 0554 08/07/23 0705  Weight: 57 kg 60.9 kg 59.4 kg    Examination: Chronically ill appearing Eyes open, does not track +corneal reflex RRR MSK - diffuse ecchymoses bilaterally Anasarca Right foot drop  +cough and gag + withdraws to pain in all 4 extremities  Ammonia  Na+  150 K+ 3.5 Bicarb 25 BUN 25 Cr 1.28 WBC 18.1 H/H 10/32.5 Platelets 66 CBGs elevated AST ALT mildly elevated  Vit C WNL Vit D WNL Folate low B12 WNL  Resolved problem list:  Hypokalemia Acute hypoxemic respiratory failure  Assessment and Plan  Acute metabolic encephalopathy, presumed related to hyperammonemia Hyperammonemia with unclear etiology, no evidence of liver cirrhosis on imaging Possible carnitine deficiency Brain MRI without acute findings. 6/13 EEG showed no seizures, same on 6/16 CT head was done  on 6/19 no acute changes Hypernatremia could be contributing, but is unlikely to fully explain her findings.  TSH WNL.  Avoid sedation Presupplementation carnitine level is pending Continue levocarnitine  supplementation Stop rifaximin  Continue lactulose  for now Patient still does not follow commands but opens eyes, withdraws in all 4 extremities Ammonia low now. Given history of bipolar disorder query catatonia. Will trial ativan  2 mg x1 and monitor response.  EEG  negative for seizure - profound encephalopathy Addendum - became more somnolent with ativan , less likely catatonia  Leukocytosis, worsening.  No fevers Cultures have been negative Keep off antibiotics No central line or Foley  AKI on chronic kidney disease (CKD) stage 3A Hypernatremia Patient serum creatinine went up likely in the setting of diuretics Discontinue diuresis Increase free water  flushes Monitor electrolytes  Thrombocytopenia likely due to critical illness;  HIT was ruled out  CAD with acute NSTEMI (non-ST elevated myocardial infarction) ; LHC demonstrated chronic occlusion of LAD HFrEF> not sure this was all acute- concern for stress induced CM vs chronic ICM Likely demand related based on absence of new lesions.  Appreciate cardiology follow-up Started on Coreg  Continue aspirin  and statin GDMT as tolerated  Hypothyroidism Continue Synthroid   Moderate malnutrition Continue dietary supplements   Best Practice (right click and Reselect all SmartList Selections daily)   Diet/type: NPO with tube feeds DVT prophylaxis SCD Pressure ulcer(s): defer to RN assessment GI prophylaxis: H2B Lines: N/A Foley:  N/A Code Status:  full code Last date of multidisciplinary goals of care discussion: 6/20 patient's sister and mother both were updated at bedside  The patient is critically ill due to encephalopathy.  Critical care was necessary to treat or prevent imminent or life-threatening deterioration.  Critical care was time spent personally by me on the following activities: development of treatment plan with patient and/or surrogate as well as nursing, discussions with consultants, evaluation of patient's response to treatment, examination of patient, obtaining history from patient or surrogate, ordering and performing treatments and interventions, ordering and review of laboratory studies, ordering and review of radiographic studies, pulse oximetry, re-evaluation of  patient's condition and participation in multidisciplinary rounds.   Critical Care Time devoted to patient care services described in this note is 35 minutes. This time reflects time of care of this signee Sarah Davis . This critical care time does not reflect separately billable procedures or procedure time, teaching time or supervisory time of PA/NP/Med student/Med Resident etc but could involve care discussion time.       Verdon GORMAN Meade Cloretta Pulmonary and Critical Care Medicine 08/09/2023 10:14 AM  Pager: see AMION  If no response to pager , please call critical care on call (see AMION) until 7pm After 7:00 pm call Elink

## 2023-08-09 NOTE — Progress Notes (Signed)
 PT Cancellation Note  Patient Details Name: Mianna Iezzi MRN: [id number] DOB: [birth year]   Cancelled Treatment:    Reason Eval/Treat Not Completed: PT screened, no needs identified, will sign off  Per RN, pt not arousing or following any commands. Please reorder PT when/if appropriate.    Macario RAMAN, PT Acute Rehabilitation Services  Office [phone]  Macario SHAUNNA Soja 08/09/2023, 11:09 AM

## 2023-08-10 ENCOUNTER — Encounter (HOSPITAL_COMMUNITY): Payer: MEDICAID

## 2023-08-10 ENCOUNTER — Inpatient Hospital Stay (HOSPITAL_COMMUNITY): Payer: MEDICAID

## 2023-08-10 DIAGNOSIS — D72829 Elevated white blood cell count, unspecified: Secondary | ICD-10-CM | POA: Diagnosis not present

## 2023-08-10 DIAGNOSIS — E7220 Disorder of urea cycle metabolism, unspecified: Secondary | ICD-10-CM | POA: Diagnosis not present

## 2023-08-10 DIAGNOSIS — G9340 Encephalopathy, unspecified: Secondary | ICD-10-CM | POA: Diagnosis not present

## 2023-08-10 DIAGNOSIS — G40909 Epilepsy, unspecified, not intractable, without status epilepticus: Secondary | ICD-10-CM | POA: Diagnosis not present

## 2023-08-10 DIAGNOSIS — R7401 Elevation of levels of liver transaminase levels: Secondary | ICD-10-CM

## 2023-08-10 DIAGNOSIS — R339 Retention of urine, unspecified: Secondary | ICD-10-CM

## 2023-08-10 DIAGNOSIS — N1831 Chronic kidney disease, stage 3a: Secondary | ICD-10-CM | POA: Diagnosis not present

## 2023-08-10 DIAGNOSIS — I214 Non-ST elevation (NSTEMI) myocardial infarction: Secondary | ICD-10-CM | POA: Diagnosis not present

## 2023-08-10 LAB — HIV ANTIBODY (ROUTINE TESTING W REFLEX): HIV Screen 4th Generation wRfx: NONREACTIVE

## 2023-08-10 LAB — COMPREHENSIVE METABOLIC PANEL WITH GFR
ALT: 336 U/L — ABNORMAL HIGH (ref 0–44)
AST: 680 U/L — ABNORMAL HIGH (ref 15–41)
Albumin: <1.5 g/dL — ABNORMAL LOW (ref 3.5–5.0)
Alkaline Phosphatase: 125 U/L (ref 38–126)
Anion gap: 9 (ref 5–15)
BUN: 31 mg/dL — ABNORMAL HIGH (ref 8–23)
CO2: 21 mmol/L — ABNORMAL LOW (ref 22–32)
Calcium: 8.2 mg/dL — ABNORMAL LOW (ref 8.9–10.3)
Chloride: 117 mmol/L — ABNORMAL HIGH (ref 98–111)
Creatinine, Ser: 1.63 mg/dL — ABNORMAL HIGH (ref 0.44–1.00)
GFR, Estimated: 35 mL/min — ABNORMAL LOW (ref >60)
Glucose, Bld: 153 mg/dL — ABNORMAL HIGH (ref 70–99)
Potassium: 3.2 mmol/L — ABNORMAL LOW (ref 3.5–5.1)
Sodium: 147 mmol/L — ABNORMAL HIGH (ref 135–145)
Total Bilirubin: 0.7 mg/dL (ref 0.0–1.2)
Total Protein: 4.9 g/dL — ABNORMAL LOW (ref 6.5–8.1)

## 2023-08-10 LAB — SEROTONIN RELEASE ASSAY (SRA)
SRA .2 IU/mL UFH Ser-aCnc: <1 % (ref 0–20)
SRA 100IU/mL UFH Ser-aCnc: <1 % (ref 0–20)

## 2023-08-10 LAB — CBC
HCT: 31.6 % — ABNORMAL LOW (ref 36.0–46.0)
Hemoglobin: 9.4 g/dL — ABNORMAL LOW (ref 12.0–15.0)
MCH: 26.5 pg (ref 26.0–34.0)
MCHC: 29.7 g/dL — ABNORMAL LOW (ref 30.0–36.0)
MCV: 89 fL (ref 80.0–100.0)
Platelets: 94 10*3/uL — ABNORMAL LOW (ref 150–400)
RBC: 3.55 MIL/uL — ABNORMAL LOW (ref 3.87–5.11)
RDW: 19.4 % — ABNORMAL HIGH (ref 11.5–15.5)
WBC: 11.7 10*3/uL — ABNORMAL HIGH (ref 4.0–10.5)
nRBC: 0 % (ref 0.0–0.2)

## 2023-08-10 LAB — CULTURE, RESPIRATORY W GRAM STAIN

## 2023-08-10 LAB — PHOSPHORUS: Phosphorus: 4.4 mg/dL (ref 2.5–4.6)

## 2023-08-10 LAB — MAGNESIUM: Magnesium: 2.4 mg/dL (ref 1.7–2.4)

## 2023-08-10 LAB — GLUCOSE, CAPILLARY
Glucose-Capillary: 155 mg/dL — ABNORMAL HIGH (ref 70–99)
Glucose-Capillary: 144 mg/dL — ABNORMAL HIGH (ref 70–99)
Glucose-Capillary: 161 mg/dL — ABNORMAL HIGH (ref 70–99)
Glucose-Capillary: 156 mg/dL — ABNORMAL HIGH (ref 70–99)
Glucose-Capillary: 175 mg/dL — ABNORMAL HIGH (ref 70–99)
Glucose-Capillary: 181 mg/dL — ABNORMAL HIGH (ref 70–99)

## 2023-08-10 LAB — T4, FREE: Free T4: 0.85 ng/dL (ref 0.61–1.12)

## 2023-08-10 MED ORDER — VITAMIN B-6 100 MG PO TABS
100.0000 mg | ORAL_TABLET | Freq: Every day | ORAL | Status: DC
  Administered 2023-08-10 – 2023-08-11 (×2): 100 mg
  Filled 2023-08-10 (×2): qty 1

## 2023-08-10 MED ORDER — POTASSIUM CHLORIDE 20 MEQ PO PACK
20.0000 meq | PACK | ORAL | Status: AC
  Administered 2023-08-10 (×2): 20 meq
  Filled 2023-08-10 (×2): qty 1

## 2023-08-10 MED ORDER — POTASSIUM CHLORIDE 10 MEQ/100ML IV SOLN
10.0000 meq | INTRAVENOUS | Status: AC
  Administered 2023-08-10 (×4): 10 meq via INTRAVENOUS
  Filled 2023-08-10 (×4): qty 100

## 2023-08-10 MED ORDER — LACTULOSE 10 GM/15ML PO SOLN
30.0000 g | Freq: Every day | ORAL | Status: DC
  Administered 2023-08-10 – 2023-08-11 (×2): 30 g
  Filled 2023-08-10 (×2): qty 45

## 2023-08-10 MED ORDER — SODIUM BICARBONATE 650 MG PO TABS
1300.0000 mg | ORAL_TABLET | Freq: Three times a day (TID) | ORAL | Status: DC
  Administered 2023-08-10 – 2023-08-11 (×2): 1300 mg via ORAL
  Filled 2023-08-10 (×3): qty 2

## 2023-08-10 NOTE — Progress Notes (Signed)
 [facility] ADULT ICU REPLACEMENT PROTOCOL   The patient does apply for the [facility] Adult ICU Electrolyte Replacment Protocol based on the criteria listed below:   1.Exclusion criteria: TCTS, ECMO, Dialysis, and Myasthenia Gravis patients 2. Is GFR >/= 30 ml/min? Yes.    Patient's GFR today is 35 3. Is SCr </= 2? Yes.   Patient's SCr is 1.63 mg/dL 4. Did SCr increase >/= 0.5 in 24 hours? No. 5.Pt's weight >40kg  Yes.   6. Abnormal electrolyte(s): K+ 3.2  7. Electrolytes replaced per protocol 8.  Call MD STAT for K+ </= 2.5, Phos </= 1, or Mag </= 1 Physician:  Dr Fate Darner, Norleen Barters 08/10/2023 5:21 AM

## 2023-08-10 NOTE — Progress Notes (Signed)
 Pt with low urine output for this shift. Bladder scanned for >999. Straight cath with 1450cc urine return with additional 200cc post bladder scan (prior to in and out). Pt tolerated well. Notified Norleen Darner, FORBES Higashi RN.

## 2023-08-10 NOTE — Progress Notes (Signed)
 LTM EEG hooked up and running - no initial skin breakdown - push button tested - [facility] monitoring.

## 2023-08-10 NOTE — Progress Notes (Signed)
 NAME:  Sarah Davis, MRN:  [id number], DOB:  1960-10-11, LOS: 11 ADMISSION DATE:  07/30/2023, CONSULTATION DATE: 08/01/2023 REFERRING MD: Cardiology, CHIEF COMPLAINT: Acute respiratory distress  History of Present Illness:  63 year old female who underwent cardiac catheterization 07/31/2023 without acute interventions required.  She does have a history of polysubstance abuse, tobacco abuse, bipolar, thyroid disease and at the time of pulmonary critical care arriving at her bedside she was in acute respiratory failure and required intubation emergently.  She transferred to the intensive care unit.  She is currently on Levophed  drip.  Chest x-ray, ABG, CT of the head have been ordered at this time.  Pertinent  Medical History   Past Medical History:  Diagnosis Date   Anxiety    Bipolar 2 disorder (HCC)    Chronic hepatitis C virus genotype 1 infection (HCC)    Chronic pancreatitis (HCC)    CKD (chronic kidney disease) stage 4, GFR 15-29 ml/min (HCC)    COPD (chronic obstructive pulmonary disease) (HCC)    Coronary artery disease 11/2019   s/p PTCI  LCx & RCA. Complicated by Vfib arrest   DM (diabetes mellitus), type 2 (HCC)    HFrEF (heart failure with reduced ejection fraction) (HCC) 08/01/2023   EF 45-50%   Hyperparathyroidism, secondary (HCC)    Hypothyroid    Lithium toxicity    Marijuana abuse    NSTEMI (non-ST elevated myocardial infarction) (HCC) 07/30/2023   med management   Peripheral autonomic neuropathy due to DM [facility])    STEMI (ST elevation myocardial infarction) (HCC) 2021   Stroke [facility])    right hemiparesis   Substance abuse (HCC)    Tardive dyskinesia    Tobacco abuse    Significant Hospital Events: Including procedures, antibiotic start and stop dates in addition to other pertinent events   6/12 - CT negative for LVO, MRI negative. No seizures on EEG.  6/14 remains somnolent.  6/16 unresponsive, ammonia back in 250 range; started carnitine, increased lactulose  6/17  ammonia back in 70s, still minimally responsive, remaining off sedation   Interim History / Subjective:  Extubated yesterday. Does not track  Objective    Blood pressure 109/60, pulse 77, temperature 98.6 F (37 C), temperature source Oral, resp. rate (!) 26, height 5' 5 (1.651 m), weight 59.4 kg, SpO2 92%.        Intake/Output Summary (Last 24 hours) at 08/10/2023 0812 Last data filed at 08/10/2023 0700 Gross per 24 hour  Intake 2961.84 ml  Output 2210 ml  Net 751.84 ml   Filed Weights   08/05/23 0500 08/06/23 0554 08/07/23 0705  Weight: 57 kg 60.9 kg 59.4 kg    Examination: Chronically ill appearing Opens eyes to voice Withdraws to pain in all 4 extremities +corneal, cough, gag Does not track or follow commands Non verbal.  Right foot drop.  Ammonia  Na+  147 K+ 3.2 Bicarb 21 BUN 31 Cr 1.63 WBC 18.1 H/H 10/32.5 Platelets 66 CBGs elevated AST ALT rising overnight  Vit C WNL Vit D WNL Folate low B12 WNL  Resolved problem list:  Hypokalemia Acute hypoxemic respiratory failure  Assessment and Plan  Acute metabolic encephalopathy, presumed related to hyperammonemia Hyperammonemia with unclear etiology, no evidence of liver cirrhosis on imaging Possible carnitine deficiency Brain MRI without acute findings. 6/13 EEG showed no seizures, same on 6/16 CT head was done on 6/19 no acute changes Hypernatremia could be contributing, but is unlikely to fully explain her findings.  TSH WNL.  Avoid sedation Presupplementation  carnitine level is pending Continue levocarnitine  supplementation Continue lactulose  for now Will consult neurology for persistent encephalopathy  Leukocytosis, worsening.  No fevers Cultures have been negative Keep off antibiotics No central line or Foley  AKI on chronic kidney disease (CKD) stage 3A Hypernatremia Acute urinary retention I think her Cr went up overnight due to urinary retention.  - she had 1500 retaining overnight.  Since then has spontaneously voided 500cc. If she continues to retain on bladder scan will place foley, start retention meds continue free water  flushes.  Monitor electrolytes and replace  Thrombocytopenia likely due to critical illness;   HIT was ruled out - platelets improved over 50 so can resume dvt ppx  Transaminitis - holding statin  CAD with acute NSTEMI (non-ST elevated myocardial infarction) ; LHC demonstrated chronic occlusion of LAD HFrEF> not sure this was all acute- concern for stress induced CM vs chronic ICM Likely demand related based on absence of new lesions.  Appreciate cardiology follow-up Started on Coreg  Continue aspirin  and holding statin due to transaminitis GDMT as tolerated  Hypothyroidism Continue Synthroid   Moderate malnutrition Continue dietary supplements   Best Practice (right click and Reselect all SmartList Selections daily)   Diet/type: NPO with tube feeds DVT prophylaxis SCD Pressure ulcer(s): defer to RN assessment GI prophylaxis: H2B Lines: N/A Foley:  N/A Code Status:  full code Last date of multidisciplinary goals of care discussion: 6/22 mother Levorn updated by phone in the morning.   I spent 50 minutes in total visit time for this patient, with more than 50% spent counseling/coordinating care.  Verdon Gore, MD Pulmonary and Critical Care Medicine [facility] 08/10/2023 8:29 AM Pager: see AMION  If no response to pager, please call critical care on call (see AMION) until 7pm After 7:00 pm call Elink

## 2023-08-10 NOTE — Progress Notes (Signed)
 NEUROLOGY CONSULT FOLLOW UP NOTE   Date of service: August 10, 2023 Patient Name: Sarah Davis MRN:  [id number] DOB:  1960/08/16  Interval Hx/subjective   Reconsulted for persistent confusion depite correction of hyperammonemia.  Vitals   Vitals:   08/10/23 0700 08/10/23 0711 08/10/23 0832 08/10/23 1157  BP: 109/60  (!) 140/61   Pulse: 77  75   Resp: (!) 26     Temp:  98.6 F (37 C)  99.3 F (37.4 C)  TempSrc:  Oral  Oral  SpO2: 92%     Weight:      Height:         Body mass index is 21.79 kg/m.  Physical Exam   General: Laying comfortably in bed; in no acute distress.  HENT: Normal oropharynx and mucosa. Normal external appearance of ears and nose.  Neck: Supple, no pain or tenderness  CV: No JVD. No peripheral edema.  Pulmonary: Symmetric Chest rise. Normal respiratory effort.  Abdomen: Soft to touch, non-tender.  Ext: No cyanosis, edema, or deformity  Skin: No rash. Normal palpation of skin.   Musculoskeletal: Normal digits and nails by inspection. No clubbing.   Neurologic Examination  Mental status/Cognition: eyes closed, opens eyes to voice with sutatined eye opening throughout the encounter. Does not follow commands or make eye contact. Starring off in the space. Speech/language: mute throughout the encounter, does not follow commands. Cranial nerves:   CN II Pupils equal and reactive to light, blinks to threat BL   CN III,IV,VI EOMI intact to dolls eyes   CN V Corneals intact   CN VII Symmetric facial grimace.   CN VIII Opens eyes to voice   CN IX & X Protecting her airway, able to elicit gag and cough with deep mouth suction   CN XI Head is midline   CN XII Does not protrude tongue on command.   Sensory/Motor:  Muscle bulk: normal, tone normal Some spontaneous movements in BL uppers. Arms fall to the side when held up above her head. Withdraws BL lower extremities to babinskis.  Coordination/Complex Motor:  Unable to assess.  Medications  Current  Facility-Administered Medications:    acetaminophen  (TYLENOL ) tablet 650 mg, 650 mg, Oral, Q4H PRN, Meng, Hao, PA   albuterol  (PROVENTIL ) (2.5 MG/3ML) 0.083% nebulizer solution 2.5 mg, 2.5 mg, Nebulization, Q4H PRN, Minor, Elsie RAMAN, NP   arformoterol  (BROVANA ) nebulizer solution 15 mcg, 15 mcg, Nebulization, BID, Gretta Leita SQUIBB, DO, 15 mcg at 08/10/23 9261   aspirin  chewable tablet 81 mg, 81 mg, Per Tube, Daily, Minor, Elsie RAMAN, NP, 81 mg at 08/10/23 1030   atorvastatin  (LIPITOR) tablet 80 mg, 80 mg, Per Tube, Daily, Minor, Elsie RAMAN, NP, 80 mg at 08/10/23 1030   carvedilol  (COREG ) tablet 3.125 mg, 3.125 mg, Per Tube, BID WC, Kate Lonni CROME, MD, 3.125 mg at 08/10/23 9167   Chlorhexidine  Gluconate Cloth 2 % PADS 6 each, 6 each, Topical, Daily, Agarwala, Ravi, MD, 6 each at 08/09/23 1947   docusate (COLACE) 50 MG/5ML liquid 100 mg, 100 mg, Per Tube, BID, Agarwala, Ravi, MD, 100 mg at 08/10/23 1031   feeding supplement (GLUCERNA 1.2 CAL) liquid 1,000 mL, 1,000 mL, Per Tube, Continuous, Clark, Laura P, DO, Last Rate: 55 mL/hr at 08/10/23 0700, Infusion Verify at 08/10/23 0700   folic acid  (FOLVITE ) tablet 1 mg, 1 mg, Per Tube, Daily, Gretta Leita P, DO, 1 mg at 08/10/23 1031   free water  300 mL, 300 mL, Per Tube, Q4H, Desai, Nikita S,  MD, 300 mL at 08/10/23 1200   heparin  injection 5,000 Units, 5,000 Units, Subcutaneous, Q8H, Meade Verdon RAMAN, MD, 5,000 Units at 08/10/23 1341   insulin  aspart (novoLOG ) injection 0-15 Units, 0-15 Units, Subcutaneous, Q4H, Gretta Leita SQUIBB, DO, 3 Units at 08/10/23 1200   insulin  aspart (novoLOG ) injection 5 Units, 5 Units, Subcutaneous, Q4H, Desai, Nikita S, MD, 5 Units at 08/10/23 1200   insulin  glargine-yfgn (SEMGLEE ) injection 15 Units, 15 Units, Subcutaneous, Daily, Paliwal, Aditya, MD, 15 Units at 08/10/23 1031   lactulose  (CHRONULAC ) 10 GM/15ML solution 30 g, 30 g, Per Tube, Daily, Meade Verdon RAMAN, MD, 30 g at 08/10/23 1031   [COMPLETED] levOCARNitine   (CARNITOR ) 5,940 mg in sodium chloride  0.9 % 750 mL IVPB, 100 mg/kg, Intravenous, Once, Stopping previously hung infusion at 08/04/23 1859 **FOLLOWED BY** levOCARNitine  (CARNITOR ) 900 mg in sodium chloride  0.9 % 250 mL (3.6 mg/mL) IVPB, 15 mg/kg, Intravenous, Q4H, Gretta Leita SQUIBB, DO, Last Rate: 500 mL/hr at 08/10/23 1336, 900 mg at 08/10/23 1336   levothyroxine  (SYNTHROID ) tablet 100 mcg, 100 mcg, Per Tube, q morning, Agarwala, Fredia, MD, 100 mcg at 08/10/23 9462   multivitamin with minerals tablet 1 tablet, 1 tablet, Per Tube, BID, Gretta Leita SQUIBB, DO, 1 tablet at 08/10/23 9167   [START ON 08/19/2023] multivitamin with minerals tablet 1 tablet, 1 tablet, Oral, Daily, Tanda Powell ORN, RPH   nitroGLYCERIN  (NITROSTAT ) SL tablet 0.4 mg, 0.4 mg, Sublingual, Q5 Min x 3 PRN, Meng, Hao, PA   ondansetron  (ZOFRAN ) injection 4 mg, 4 mg, Intravenous, Q6H PRN, Meng, Hao, PA   Oral care mouth rinse, 15 mL, Mouth Rinse, PRN, Arlinda Fredia, MD   Oral care mouth rinse, 15 mL, Mouth Rinse, PRN, Gretta Leita SQUIBB, DO   Oral care mouth rinse, 15 mL, Mouth Rinse, Q4H, Chand, Sudham, MD, 15 mL at 08/10/23 1200   polyethylene glycol (MIRALAX  / GLYCOLAX ) packet 17 g, 17 g, Per Tube, Daily, Agarwala, Ravi, MD, 17 g at 08/08/23 0827   pyridOXINE (VITAMIN B6) tablet 100 mg, 100 mg, Per Tube, Daily, Karmina Zufall, MD, 100 mg at 08/10/23 1032   revefenacin  (YUPELRI ) nebulizer solution 175 mcg, 175 mcg, Nebulization, Daily, Gretta Leita SQUIBB, DO, 175 mcg at 08/10/23 9261   sodium bicarbonate  tablet 1,300 mg, 1,300 mg, Oral, Q8H, Meade Verdon RAMAN, MD   sodium chloride  flush (NS) 0.9 % injection 3 mL, 3 mL, Intravenous, PRN, Patwardhan, Manish J, MD   zinc  sulfate (50mg  elemental zinc ) capsule 220 mg, 220 mg, Per Tube, Daily, Chand, Sudham, MD, 220 mg at 08/10/23 1032  Labs and Diagnostic Imaging   CBC:  Recent Labs  Lab 08/09/23 0238 08/10/23 0324  WBC 18.1* 11.7*  HGB 10.0* 9.4*  HCT 32.3* 31.6*  MCV 87.1 89.0  PLT 66*  94*    Basic Metabolic Panel:  Lab Results  Component Value Date   NA 147 (H) 08/10/2023   K 3.2 (L) 08/10/2023   CO2 21 (L) 08/10/2023   GLUCOSE 153 (H) 08/10/2023   BUN 31 (H) 08/10/2023   CREATININE 1.63 (H) 08/10/2023   CALCIUM  8.2 (L) 08/10/2023   GFRNONAA 35 (L) 08/10/2023   GFRAA 36 (L) 09/05/2018   Lipid Panel:  Lab Results  Component Value Date   LDLCALC 47 08/01/2023   HgbA1c:  Lab Results  Component Value Date   HGBA1C 4.8 08/01/2023   Urine Drug Screen:     Component Value Date/Time   LABOPIA NONE DETECTED 06/19/2017 2306   COCAINSCRNUR NONE DETECTED 06/19/2017  2306   LABBENZ POSITIVE (A) 06/19/2017 2306   AMPHETMU POSITIVE (A) 06/19/2017 2306   THCU POSITIVE (A) 06/19/2017 2306   LABBARB NONE DETECTED 06/19/2017 2306    Alcohol Level     Component Value Date/Time   ETH <10 09/06/2018 0014   INR  Lab Results  Component Value Date   INR 1.0 02/28/2016   APTT  Lab Results  Component Value Date   APTT 132 (H) 07/30/2023   AED levels: No results found for: PHENYTOIN, ZONISAMIDE, LAMOTRIGINE , LEVETIRACETA  CT Head without contrast(Personally reviewed): CTH was negative for a large hypodensity concerning for a large territory infarct or hyperdensity concerning for an ICH  CT angio Head and Neck with contrast(Personally reviewed): CTA neck:   1. Common carotid and internal carotid arteries patent within the neck without stenosis. Mild atherosclerotic plaque bilaterally, as described. 2. Vertebral arteries patent within the neck without stenosis or significant atherosclerotic disease. 3. Aortic Atherosclerosis (ICD10-I70.0).   CTA head:   1. No proximal intracranial large vessel occlusion or high-grade proximal arterial stenosis identified. 2. Atherosclerotic plaque within the intracranial internal carotid arteries with no more than mild stenosis.  MRI Brain(Personally reviewed): 1. No acute intracranial abnormality. 2. Moderately  advanced scattered cortical and subcortical T2 and FLAIR hyperintensities bilaterally for age. This likely reflects the sequelae of chronic microvascular ischemia. 3. 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  rEEG:  This study is suggestive of severe to profound diffuse encephalopathy. No seizures or epileptiform discharges were seen throughout the recording.   Assessment   Sarah Davis is a 63 y.o. female with hx of polysubstance abuse, tobacco abuse, bipolar, thyroid disease who initially was admitted from [facility] for a cardiac cath, no interventions performed. Noted to be confused afterwards and neurology consulted. Initially significantly hyperammonemic. However, this has resolved now but has persistent encephalopathy and neurology reconsulted. On exam, opens eyes wide and keeps them open to voice, stares through th examiner, not making eye contact. Arms fall to the side when held up above her head. Withdraws BL lower extremities.  Differential is broad, possibly recurrent seizures, does have L frontal cortical stroke that could be a potential trigger. Will also check additional encephalopathy labs. Possibly could be catatonia or behavioral in nature(possibly apathy), since she does not seem lethargic, has good eye opening and arms fall to the side when held up above her head?. It maybe that given her baseline comorbidities and hx of substance use, that this maybe just a clinical lag and she will improve spontaneously in a day or 2 after resolution of hpertammonemia.  Recommendations  - cEEG - Carboxyhemoglobin - recent B12 was normal, folate low but being replaced. TSH normal. - will check RPR, HIV - delirium precautions - avoid sedating medications. ______________________________________________________________________   Signed, Varshini Arrants, MD [facility]

## 2023-08-10 NOTE — Consult Note (Incomplete)
 NEUROLOGY CONSULT NOTE   Date of service: August 10, 2023 Patient Name: Sarah Davis MRN:  [id number] DOB:  09-12-1960 Chief Complaint: *** Requesting Provider: Meade Verdon RAMAN, MD  History of Present Illness  Sarah Davis is a 63 y.o. female with hx of ***  LKW: *** Modified rankin score: {Modified Rankin Scale:21264} IV Thrombolysis: ***Yes, *** No (reason) EVT: ***Yes, *** No (reason) ICH Score:***  NIHSS components Score: Comment  1a Level of Conscious 0[]  1[]  2[]  3[]      1b LOC Questions 0[]  1[]  2[]       1c LOC Commands 0[]  1[]  2[]       2 Best Gaze 0[]  1[]  2[]       3 Visual 0[]  1[]  2[]  3[]      4 Facial Palsy 0[]  1[]  2[]  3[]      5a Motor Arm - left 0[]  1[]  2[]  3[]  4[]  UN[]    5b Motor Arm - Right 0[]  1[]  2[]  3[]  4[]  UN[]    6a Motor Leg - Left 0[]  1[]  2[]  3[]  4[]  UN[]    6b Motor Leg - Right 0[]  1[]  2[]  3[]  4[]  UN[]    7 Limb Ataxia 0[]  1[]  2[]  UN[]      8 Sensory 0[]  1[]  2[]  UN[]      9 Best Language 0[]  1[]  2[]  3[]      10 Dysarthria 0[]  1[]  2[]  UN[]      11 Extinct. and Inattention 0[]  1[]  2[]       TOTAL:       ROS  ***Comprehensive ROS performed and pertinent positives documented in HPI  ***Unable to ascertain due to ***  Past History   Past Medical History:  Diagnosis Date   Anxiety    Bipolar 2 disorder (HCC)    Chronic hepatitis C virus genotype 1 infection (HCC)    Chronic pancreatitis (HCC)    CKD (chronic kidney disease) stage 4, GFR 15-29 ml/min (HCC)    COPD (chronic obstructive pulmonary disease) (HCC)    Coronary artery disease 11/2019   s/p PTCI  LCx & RCA. Complicated by Vfib arrest   DM (diabetes mellitus), type 2 (HCC)    HFrEF (heart failure with reduced ejection fraction) (HCC) 08/01/2023   EF 45-50%   Hyperparathyroidism, secondary (HCC)    Hypothyroid    Lithium toxicity    Marijuana abuse    NSTEMI (non-ST elevated myocardial infarction) (HCC) 07/30/2023   med management   Peripheral autonomic neuropathy due to DM [facility])    STEMI (ST elevation  myocardial infarction) (HCC) 2021   Stroke [facility])    right hemiparesis   Substance abuse (HCC)    Tardive dyskinesia    Tobacco abuse     Past Surgical History:  Procedure Laterality Date   CARDIAC CATHETERIZATION     CHOLECYSTECTOMY     LEFT HEART CATH AND CORONARY ANGIOGRAPHY N/A 07/31/2023   Patent LCx & RCA stents. Chronic occlussion mLAD with collaterals=med management    Family History: Family History  Problem Relation Age of Onset   Diabetes Father     Social History  reports that she has been smoking cigarettes. She started smoking about 29 years ago. She has a 14.7 pack-year smoking history. She has never used smokeless tobacco. She reports current drug use. Drugs: Benzodiazepines and Marijuana. She reports that she does not drink alcohol.  Allergies  Allergen Reactions   Lithium Anaphylaxis   Sulfa Antibiotics Rash   Codeine Rash    Medications   Current Facility-Administered Medications:    acetaminophen  (TYLENOL ) tablet 650 mg,  650 mg, Oral, Q4H PRN, Meng, Hao, PA   albuterol  (PROVENTIL ) (2.5 MG/3ML) 0.083% nebulizer solution 2.5 mg, 2.5 mg, Nebulization, Q4H PRN, Minor, Elsie RAMAN, NP   arformoterol  (BROVANA ) nebulizer solution 15 mcg, 15 mcg, Nebulization, BID, Gretta Leita SQUIBB, DO, 15 mcg at 08/10/23 9261   aspirin  chewable tablet 81 mg, 81 mg, Per Tube, Daily, Minor, Elsie RAMAN, NP, 81 mg at 08/09/23 9175   atorvastatin  (LIPITOR) tablet 80 mg, 80 mg, Per Tube, Daily, Minor, Elsie RAMAN, NP, 80 mg at 08/09/23 0825   carvedilol  (COREG ) tablet 3.125 mg, 3.125 mg, Per Tube, BID WC, Kate Lonni CROME, MD, 3.125 mg at 08/10/23 9167   Chlorhexidine  Gluconate Cloth 2 % PADS 6 each, 6 each, Topical, Daily, Agarwala, Ravi, MD, 6 each at 08/09/23 1947   docusate (COLACE) 50 MG/5ML liquid 100 mg, 100 mg, Per Tube, BID, Agarwala, Ravi, MD, 100 mg at 08/09/23 9175   feeding supplement (GLUCERNA 1.2 CAL) liquid 1,000 mL, 1,000 mL, Per Tube, Continuous, Clark, Laura P, DO,  Last Rate: 55 mL/hr at 08/10/23 0700, Infusion Verify at 08/10/23 0700   folic acid  (FOLVITE ) tablet 1 mg, 1 mg, Per Tube, Daily, Gretta Leita P, DO, 1 mg at 08/09/23 9175   free water  300 mL, 300 mL, Per Tube, Q4H, Desai, Nikita S, MD, 300 mL at 08/10/23 0449   heparin  injection 5,000 Units, 5,000 Units, Subcutaneous, Q8H, Desai, Nikita S, MD, 5,000 Units at 08/10/23 0536   insulin  aspart (novoLOG ) injection 0-15 Units, 0-15 Units, Subcutaneous, Q4H, Gretta Leita SQUIBB, DO, 2 Units at 08/10/23 0800   insulin  aspart (novoLOG ) injection 5 Units, 5 Units, Subcutaneous, Q4H, Desai, Nikita S, MD, 5 Units at 08/10/23 9167   insulin  glargine-yfgn (SEMGLEE ) injection 15 Units, 15 Units, Subcutaneous, Daily, Paliwal, Aditya, MD, 15 Units at 08/09/23 1033   lactulose  (CHRONULAC ) 10 GM/15ML solution 30 g, 30 g, Per Tube, Daily, Desai, Nikita S, MD   [COMPLETED] levOCARNitine  (CARNITOR ) 5,940 mg in sodium chloride  0.9 % 750 mL IVPB, 100 mg/kg, Intravenous, Once, Stopping previously hung infusion at 08/04/23 1859 **FOLLOWED BY** levOCARNitine  (CARNITOR ) 900 mg in sodium chloride  0.9 % 250 mL (3.6 mg/mL) IVPB, 15 mg/kg, Intravenous, Q4H, Gretta Leita SQUIBB, DO, Last Rate: 500 mL/hr at 08/10/23 0837, 900 mg at 08/10/23 9162   levothyroxine  (SYNTHROID ) tablet 100 mcg, 100 mcg, Per Tube, q morning, Agarwala, Ravi, MD, 100 mcg at 08/10/23 9462   multivitamin with minerals tablet 1 tablet, 1 tablet, Per Tube, BID, Gretta Leita SQUIBB, DO, 1 tablet at 08/10/23 9167   [START ON 08/19/2023] multivitamin with minerals tablet 1 tablet, 1 tablet, Oral, Daily, Tanda Powell ORN, RPH   nitroGLYCERIN  (NITROSTAT ) SL tablet 0.4 mg, 0.4 mg, Sublingual, Q5 Min x 3 PRN, Meng, Hao, PA   ondansetron  (ZOFRAN ) injection 4 mg, 4 mg, Intravenous, Q6H PRN, Meng, Hao, PA   Oral care mouth rinse, 15 mL, Mouth Rinse, PRN, Agarwala, Ravi, MD   Oral care mouth rinse, 15 mL, Mouth Rinse, PRN, Gretta Leita SQUIBB, DO   Oral care mouth rinse, 15 mL, Mouth Rinse,  Q4H, Chand, Sudham, MD, 15 mL at 08/10/23 0800   polyethylene glycol (MIRALAX  / GLYCOLAX ) packet 17 g, 17 g, Per Tube, Daily, Agarwala, Ravi, MD, 17 g at 08/08/23 0827   potassium chloride  (KLOR-CON ) packet 20 mEq, 20 mEq, Per Tube, Q4H, Kamat, Sunil G, MD, 20 mEq at 08/10/23 0537   potassium chloride  10 mEq in 100 mL IVPB, 10 mEq, Intravenous, Q1 Hr x 4, Kamat,  Sunil G, MD, Last Rate: 100 mL/hr at 08/10/23 0831, 10 mEq at 08/10/23 0831   pyridOXINE (VITAMIN B6) tablet 100 mg, 100 mg, Per Tube, Daily, Breton Berns, MD   revefenacin  (YUPELRI ) nebulizer solution 175 mcg, 175 mcg, Nebulization, Daily, Gretta Leita SQUIBB, DO, 175 mcg at 08/10/23 9261   sodium bicarbonate  tablet 1,300 mg, 1,300 mg, Oral, Q8H, Desai, Nikita S, MD   sodium chloride  flush (NS) 0.9 % injection 3 mL, 3 mL, Intravenous, PRN, Patwardhan, Manish J, MD   zinc  sulfate (50mg  elemental zinc ) capsule 220 mg, 220 mg, Per Tube, Daily, Chand, Sudham, MD, 220 mg at 08-22-23 0824  Vitals   Vitals:   08/10/23 0630 08/10/23 0700 08/10/23 0711 08/10/23 0832  BP: 105/64 109/60  (!) 140/61  Pulse: 80 77  75  Resp: (!) 26 (!) 26    Temp:   98.6 F (37 C)   TempSrc:   Oral   SpO2: 93% 92%    Weight:      Height:        Body mass index is 21.79 kg/m.   Physical Exam   Constitutional: Appears well-developed and well-nourished. *** Psych: Affect appropriate to situation. *** Eyes: No scleral injection. *** HENT: No OP obstruction. *** Head: Normocephalic. *** Cardiovascular: Normal rate and regular rhythm. *** Respiratory: Effort normal, non-labored breathing. *** GI: Soft.  No distension. There is no tenderness. *** Skin: WDI. ***  Neurologic Examination   ***  Labs/Imaging/Neurodiagnostic studies   CBC:  Recent Labs  Lab 08/22/2023 0238 08/10/23 0324  WBC 18.1* 11.7*  HGB 10.0* 9.4*  HCT 32.3* 31.6*  MCV 87.1 89.0  PLT 66* 94*   Basic Metabolic Panel:  Lab Results  Component Value Date   NA 147 (H)  08/10/2023   K 3.2 (L) 08/10/2023   CO2 21 (L) 08/10/2023   GLUCOSE 153 (H) 08/10/2023   BUN 31 (H) 08/10/2023   CREATININE 1.63 (H) 08/10/2023   CALCIUM  8.2 (L) 08/10/2023   GFRNONAA 35 (L) 08/10/2023   GFRAA 36 (L) 09/05/2018   Lipid Panel:  Lab Results  Component Value Date   LDLCALC 47 08/01/2023   HgbA1c:  Lab Results  Component Value Date   HGBA1C 4.8 08/01/2023   Urine Drug Screen:     Component Value Date/Time   LABOPIA NONE DETECTED 06/19/2017 2306   COCAINSCRNUR NONE DETECTED 06/19/2017 2306   LABBENZ POSITIVE (A) 06/19/2017 2306   AMPHETMU POSITIVE (A) 06/19/2017 2306   THCU POSITIVE (A) 06/19/2017 2306   LABBARB NONE DETECTED 06/19/2017 2306    Alcohol Level     Component Value Date/Time   ETH <10 09/06/2018 0014   INR  Lab Results  Component Value Date   INR 1.0 02/28/2016   APTT  Lab Results  Component Value Date   APTT 132 (H) 07/30/2023   AED levels: No results found for: PHENYTOIN, ZONISAMIDE, LAMOTRIGINE , LEVETIRACETA  CT Head without contrast(Personally reviewed): ***  CT angio Head and Neck with contrast(Personally reviewed): ***  MR Angio head without contrast and Carotid Duplex BL(Personally reviewed): ***  MRI Brain(Personally reviewed): ***  Neurodiagnostics rEEG:  ***  ASSESSMENT   Sarah Davis is a 63 y.o. female ***  RECOMMENDATIONS  *** ______________________________________________________________________    Bonney Ellouise Mari, MD [facility]

## 2023-08-11 ENCOUNTER — Inpatient Hospital Stay (HOSPITAL_COMMUNITY): Payer: MEDICAID

## 2023-08-11 DIAGNOSIS — I214 Non-ST elevation (NSTEMI) myocardial infarction: Secondary | ICD-10-CM | POA: Diagnosis not present

## 2023-08-11 DIAGNOSIS — G9341 Metabolic encephalopathy: Secondary | ICD-10-CM | POA: Diagnosis not present

## 2023-08-11 DIAGNOSIS — N1831 Chronic kidney disease, stage 3a: Secondary | ICD-10-CM | POA: Diagnosis not present

## 2023-08-11 DIAGNOSIS — R4182 Altered mental status, unspecified: Secondary | ICD-10-CM

## 2023-08-11 DIAGNOSIS — R7401 Elevation of levels of liver transaminase levels: Secondary | ICD-10-CM | POA: Diagnosis not present

## 2023-08-11 DIAGNOSIS — G9340 Encephalopathy, unspecified: Secondary | ICD-10-CM

## 2023-08-11 DIAGNOSIS — R569 Unspecified convulsions: Secondary | ICD-10-CM | POA: Diagnosis not present

## 2023-08-11 LAB — COMPREHENSIVE METABOLIC PANEL WITH GFR
ALT: 206 U/L — ABNORMAL HIGH (ref 0–44)
AST: 224 U/L — ABNORMAL HIGH (ref 15–41)
Albumin: <1.5 g/dL — ABNORMAL LOW (ref 3.5–5.0)
Alkaline Phosphatase: 87 U/L (ref 38–126)
Anion gap: 6 (ref 5–15)
BUN: 34 mg/dL — ABNORMAL HIGH (ref 8–23)
CO2: 25 mmol/L (ref 22–32)
Calcium: 7.8 mg/dL — ABNORMAL LOW (ref 8.9–10.3)
Chloride: 122 mmol/L — ABNORMAL HIGH (ref 98–111)
Creatinine, Ser: 1.48 mg/dL — ABNORMAL HIGH (ref 0.44–1.00)
GFR, Estimated: 40 mL/min — ABNORMAL LOW (ref >60)
Glucose, Bld: 145 mg/dL — ABNORMAL HIGH (ref 70–99)
Potassium: 4.8 mmol/L (ref 3.5–5.1)
Sodium: 153 mmol/L — ABNORMAL HIGH (ref 135–145)
Total Bilirubin: 0.9 mg/dL (ref 0.0–1.2)
Total Protein: 3.2 g/dL — ABNORMAL LOW (ref 6.5–8.1)

## 2023-08-11 LAB — GLUCOSE, CAPILLARY
Glucose-Capillary: 157 mg/dL — ABNORMAL HIGH (ref 70–99)
Glucose-Capillary: 157 mg/dL — ABNORMAL HIGH (ref 70–99)
Glucose-Capillary: 174 mg/dL — ABNORMAL HIGH (ref 70–99)
Glucose-Capillary: 146 mg/dL — ABNORMAL HIGH (ref 70–99)
Glucose-Capillary: 162 mg/dL — ABNORMAL HIGH (ref 70–99)
Glucose-Capillary: 167 mg/dL — ABNORMAL HIGH (ref 70–99)

## 2023-08-11 LAB — RPR: RPR Ser Ql: NONREACTIVE

## 2023-08-11 MED ORDER — VITAMIN B-6 100 MG PO TABS
100.0000 mg | ORAL_TABLET | Freq: Every day | ORAL | Status: DC
  Administered 2023-08-12: 100 mg via ORAL
  Filled 2023-08-11: qty 1

## 2023-08-11 MED ORDER — BETHANECHOL CHLORIDE 10 MG PO TABS
25.0000 mg | ORAL_TABLET | Freq: Three times a day (TID) | ORAL | Status: DC
  Administered 2023-08-12: 25 mg via ORAL
  Filled 2023-08-11 (×2): qty 3

## 2023-08-11 MED ORDER — POLYETHYLENE GLYCOL 3350 17 G PO PACK: 17.0000 g | PACK | Freq: Every day | ORAL | Status: DC

## 2023-08-11 MED ORDER — LACTULOSE 10 GM/15ML PO SOLN
30.0000 g | Freq: Every day | ORAL | Status: DC
  Administered 2023-08-12: 30 g via ORAL
  Filled 2023-08-11: qty 45

## 2023-08-11 MED ORDER — FREE WATER
100.0000 mL | Status: DC
  Administered 2023-08-11 – 2023-08-13 (×46): 100 mL

## 2023-08-11 MED ORDER — GADOBUTROL 1 MMOL/ML IV SOLN
6.0000 mL | Freq: Once | INTRAVENOUS | Status: AC | PRN
  Administered 2023-08-11: 6 mL via INTRAVENOUS

## 2023-08-11 MED ORDER — SODIUM BICARBONATE 650 MG PO TABS
1300.0000 mg | ORAL_TABLET | Freq: Two times a day (BID) | ORAL | Status: DC
  Administered 2023-08-11 – 2023-08-12 (×2): 1300 mg via ORAL
  Filled 2023-08-11 (×2): qty 2

## 2023-08-11 MED ORDER — LEVOTHYROXINE SODIUM 100 MCG PO TABS
100.0000 ug | ORAL_TABLET | Freq: Every morning | ORAL | Status: DC
  Administered 2023-08-12: 100 ug via ORAL
  Filled 2023-08-11: qty 1

## 2023-08-11 MED ORDER — BETHANECHOL CHLORIDE 10 MG PO TABS
25.0000 mg | ORAL_TABLET | Freq: Three times a day (TID) | ORAL | Status: DC
  Administered 2023-08-11 (×3): 25 mg
  Filled 2023-08-11 (×2): qty 3

## 2023-08-11 MED ORDER — ADULT MULTIVITAMIN W/MINERALS CH
1.0000 | ORAL_TABLET | Freq: Two times a day (BID) | ORAL | Status: DC
  Administered 2023-08-12: 1 via ORAL
  Filled 2023-08-11: qty 1

## 2023-08-11 MED ORDER — FOLIC ACID 1 MG PO TABS
1.0000 mg | ORAL_TABLET | Freq: Every day | ORAL | Status: DC
  Administered 2023-08-12: 1 mg via ORAL
  Filled 2023-08-11: qty 1

## 2023-08-11 MED ORDER — ASPIRIN 81 MG PO CHEW
81.0000 mg | CHEWABLE_TABLET | Freq: Every day | ORAL | Status: DC
  Administered 2023-08-12: 81 mg via ORAL
  Filled 2023-08-11: qty 1

## 2023-08-11 MED ORDER — DOCUSATE SODIUM 50 MG/5ML PO LIQD: 100.0000 mg | Freq: Two times a day (BID) | ORAL | Status: DC

## 2023-08-11 MED ORDER — ZINC SULFATE 220 (50 ZN) MG PO CAPS
220.0000 mg | ORAL_CAPSULE | Freq: Every day | ORAL | Status: DC
  Administered 2023-08-12: 220 mg via ORAL
  Filled 2023-08-11: qty 1

## 2023-08-11 NOTE — Progress Notes (Signed)
 NEUROLOGY CONSULT FOLLOW UP NOTE   Date of service: August 11, 2023 Patient Name: Sarah Davis MRN:  [id number] DOB:  August 27, 1960  Interval Hx/subjective   No family at the bedside. RN att he bedside. She remains on LTM. LTM read with intermittent generalized slowing. Will discontinue LTM today. When eyes are focused she has bilateral fast haphazard nystagmus.  Will get MRI brain w/wo today  RN states she has been retaining at least 2L requiring in and out cath.   Vitals   Vitals:   08/11/23 0400 08/11/23 0500 08/11/23 0600 08/11/23 0736  BP: (!) 141/83 112/64 (!) 101/53   Pulse: 99 100 91 88  Resp: (!) 39 (!) 34 (!) 24 (!) 25  Temp:      TempSrc:      SpO2: (!) 88% 91% 92% 94%  Weight:      Height:         Body mass index is 22.05 kg/m.  Physical Exam   Constitutional: critically ill, laying in bed in NAD.  Eyes: No scleral injection.  HENT: No OP obstrucion.  Head: Normocephalic.  Cardiovascular: Normal rate and regular rhythm.  Respiratory: Effort normal, non-labored breathing.  GI: Soft.  No distension. There is no tenderness.  Skin: WDI.  Ext: generalized edema noted throughout   Neurologic Examination   Eyes are open. Does not follow commands or speak. No grimacing or movement to noxious stimuli. When eyes are focused she exhibits fast beating bidirectional haphazard nystagmus. Bilateral blink to threat. Withdraws in all 4 extremities   Medications  Current Facility-Administered Medications:    acetaminophen  (TYLENOL ) tablet 650 mg, 650 mg, Oral, Q4H PRN, Meng, Hao, PA   albuterol  (PROVENTIL ) (2.5 MG/3ML) 0.083% nebulizer solution 2.5 mg, 2.5 mg, Nebulization, Q4H PRN, Minor, Elsie RAMAN, NP   arformoterol  (BROVANA ) nebulizer solution 15 mcg, 15 mcg, Nebulization, BID, Gretta Leita SQUIBB, DO, 15 mcg at 08/11/23 9264   aspirin  chewable tablet 81 mg, 81 mg, Per Tube, Daily, Minor, Elsie RAMAN, NP, 81 mg at 08/10/23 1030   bethanechol (URECHOLINE) tablet 25 mg, 25 mg, Per  Tube, TID, Hunsucker, Donnice SAUNDERS, MD   Chlorhexidine  Gluconate Cloth 2 % PADS 6 each, 6 each, Topical, Daily, Agarwala, Ravi, MD, 6 each at 08/09/23 1947   docusate (COLACE) 50 MG/5ML liquid 100 mg, 100 mg, Per Tube, BID, Agarwala, Ravi, MD, 100 mg at 08/10/23 2100   feeding supplement (GLUCERNA 1.2 CAL) liquid 1,000 mL, 1,000 mL, Per Tube, Continuous, Gretta Leita SQUIBB, DO, Last Rate: 55 mL/hr at 08/10/23 0700, Infusion Verify at 08/10/23 0700   folic acid  (FOLVITE ) tablet 1 mg, 1 mg, Per Tube, Daily, Gretta Leita P, DO, 1 mg at 08/10/23 1031   free water  300 mL, 300 mL, Per Tube, Q4H, Desai, Nikita S, MD, 300 mL at 08/11/23 0346   heparin  injection 5,000 Units, 5,000 Units, Subcutaneous, Q8H, Desai, Nikita S, MD, 5,000 Units at 08/11/23 9491   insulin  aspart (novoLOG ) injection 0-15 Units, 0-15 Units, Subcutaneous, Q4H, Gretta Leita SQUIBB, DO, 3 Units at 08/11/23 0345   insulin  aspart (novoLOG ) injection 5 Units, 5 Units, Subcutaneous, Q4H, Desai, Nikita S, MD, 5 Units at 08/11/23 0345   insulin  glargine-yfgn (SEMGLEE ) injection 15 Units, 15 Units, Subcutaneous, Daily, Paliwal, Aditya, MD, 15 Units at 08/10/23 1031   lactulose  (CHRONULAC ) 10 GM/15ML solution 30 g, 30 g, Per Tube, Daily, Desai, Nikita S, MD, 30 g at 08/10/23 1031   levothyroxine  (SYNTHROID ) tablet 100 mcg, 100 mcg, Per Tube, q morning,  Arlinda Ranks, MD, 100 mcg at 08/11/23 9491   multivitamin with minerals tablet 1 tablet, 1 tablet, Per Tube, BID, Gretta Leita SQUIBB, DO, 1 tablet at 08/10/23 1946   [START ON 08/19/2023] multivitamin with minerals tablet 1 tablet, 1 tablet, Oral, Daily, Tanda Powell ORN, RPH   nitroGLYCERIN  (NITROSTAT ) SL tablet 0.4 mg, 0.4 mg, Sublingual, Q5 Min x 3 PRN, Meng, Hao, PA   ondansetron  (ZOFRAN ) injection 4 mg, 4 mg, Intravenous, Q6H PRN, Meng, Hao, PA   Oral care mouth rinse, 15 mL, Mouth Rinse, PRN, Arlinda Ranks, MD   Oral care mouth rinse, 15 mL, Mouth Rinse, PRN, Gretta Leita SQUIBB, DO   Oral care mouth rinse,  15 mL, Mouth Rinse, Q4H, Chand, Sudham, MD, 15 mL at 08/11/23 0346   polyethylene glycol (MIRALAX  / GLYCOLAX ) packet 17 g, 17 g, Per Tube, Daily, Agarwala, Ravi, MD, 17 g at 08/08/23 0827   pyridOXINE (VITAMIN B6) tablet 100 mg, 100 mg, Per Tube, Daily, Khaliqdina, Salman, MD, 100 mg at 08/10/23 1032   revefenacin  (YUPELRI ) nebulizer solution 175 mcg, 175 mcg, Nebulization, Daily, Gretta Leita SQUIBB, DO, 175 mcg at 08/11/23 0735   sodium bicarbonate  tablet 1,300 mg, 1,300 mg, Oral, Q8H, Meade Verdon RAMAN, MD, 1,300 mg at 08/11/23 9491   sodium chloride  flush (NS) 0.9 % injection 3 mL, 3 mL, Intravenous, PRN, Patwardhan, Manish J, MD   zinc  sulfate (50mg  elemental zinc ) capsule 220 mg, 220 mg, Per Tube, Daily, Chand, Sudham, MD, 220 mg at 08/10/23 1032  Labs and Diagnostic Imaging   CBC:  Recent Labs  Lab 08/09/23 0238 08/10/23 0324  WBC 18.1* 11.7*  HGB 10.0* 9.4*  HCT 32.3* 31.6*  MCV 87.1 89.0  PLT 66* 94*    Basic Metabolic Panel:  Lab Results  Component Value Date   NA 147 (H) 08/10/2023   K 3.2 (L) 08/10/2023   CO2 21 (L) 08/10/2023   GLUCOSE 153 (H) 08/10/2023   BUN 31 (H) 08/10/2023   CREATININE 1.63 (H) 08/10/2023   CALCIUM  8.2 (L) 08/10/2023   GFRNONAA 35 (L) 08/10/2023   GFRAA 36 (L) 09/05/2018   Lipid Panel:  Lab Results  Component Value Date   LDLCALC 47 08/01/2023   HgbA1c:  Lab Results  Component Value Date   HGBA1C 4.8 08/01/2023   Urine Drug Screen:     Component Value Date/Time   LABOPIA NONE DETECTED 06/19/2017 2306   COCAINSCRNUR NONE DETECTED 06/19/2017 2306   LABBENZ POSITIVE (A) 06/19/2017 2306   AMPHETMU POSITIVE (A) 06/19/2017 2306   THCU POSITIVE (A) 06/19/2017 2306   LABBARB NONE DETECTED 06/19/2017 2306    Alcohol Level     Component Value Date/Time   ETH <10 09/06/2018 0014   INR  Lab Results  Component Value Date   INR 1.0 02/28/2016   APTT  Lab Results  Component Value Date   APTT 132 (H) 07/30/2023     CT Head without  contrast(Personally reviewed): CTH was negative for a large hypodensity concerning for a large territory infarct or hyperdensity concerning for an ICH   CT angio Head and Neck with contrast(Personally reviewed): CTA neck:   1. Common carotid and internal carotid arteries patent within the neck without stenosis. Mild atherosclerotic plaque bilaterally, as described. 2. Vertebral arteries patent within the neck without stenosis or significant atherosclerotic disease. 3. Aortic Atherosclerosis (ICD10-I70.0).   CTA head:   1. No proximal intracranial large vessel occlusion or high-grade proximal arterial stenosis identified. 2. Atherosclerotic plaque within the  intracranial internal carotid arteries with no more than mild stenosis.   MRI Brain(Personally reviewed): 1. No acute intracranial abnormality. 2. Moderately advanced scattered cortical and subcortical T2 and FLAIR hyperintensities bilaterally for age. This likely reflects the sequelae of chronic microvascular ischemia. 3. 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   rEEG:  This study is suggestive of severe to profound diffuse encephalopathy. No seizures or epileptiform discharges were seen throughout the recording.   cEEG 6/22-6/23: This technically difficult study is suggestive of mild diffuse encephalopathy. No seizures or epileptiform discharges were seen throughout the recording.   Labs:  RPR/HIV non reactive Free T4 0.85 Carboxyhemoglobin pending  B12 655 Vitamin E 15.9 Zinc  42  Folate 3.8 Vitamin D  82.63 Vitamin C 0.5 Vitamin B1 86 Vitamin B6 1.9 RPR nonreactive HIV nonreactive   Assessment   Sarah Davis is a 63 y.o. female with hx of polysubstance abuse, tobacco abuse, bipolar, thyroid disease who initially was admitted from [facility] for a cardiac cath, no interventions performed. Noted to be confused afterwards and neurology consulted. Initially  significantly hyperammonemic. However, this has resolved now but has persistent encephalopathy and neurology reconsulted. On exam, opens eyes wide and keeps them open to voice, stares through th examiner, not making eye contact. Arms fall to the side when held up above her head. Withdraws BL lower extremities.  Haphazard nystagmus noted on exam today.  Broad differentials included seizures.  EEGs including continuous EEG have been unremarkable. Metabolic derangements are also in differentials-Vitamin B6 and folate levels as well as zinc  levels were low.  Ammonia has normalized.   Encephalopathy labs-B12, RPR and HIV unremarkable.  TSH normal Given extensive psychiatric history, primary psychiatric etiology should also be entertained if the workup below remains unremarkable    Recommendations  -Discontinue LTM.  - Delirium precautions  - MRI brain w/wo  -If MRI of the brain is unremarkable, consider a formal psychiatry consultation - Will follow  Plan discussed with Dr. Annella  ______________________________________________________________________   Signed, Karna DELENA Geralds, NP [facility]  Attending Neurohospitalist Addendum Patient seen and examined with APP/Resident. Agree with the history and physical as documented above. Agree with the plan as documented, which I helped formulate. I have independently reviewed the chart, obtained history, review of systems and examined the patient.I have personally reviewed pertinent head/neck/spine imaging (CT/MRI). Please feel free to call with any questions.  -- Eligio Lav, MD Neurologist [facility] Pager: [phone]

## 2023-08-11 NOTE — Progress Notes (Signed)
 Nutrition Follow-up  DOCUMENTATION CODES:  Non-severe (moderate) malnutrition in context of chronic illness  INTERVENTION:  Adjust tube feeding via cortrak: Glucerna 1.2 at 55 ml/h (1320 ml per day) 100mL free water  flush qh (per MD) Provides 1520 kcal, 87 gm protein, 1063 ml free water  daily (TF+flush = 2263 mL of free water ) Continue the following replacements: Vitamin B6 (100mg  x 14 days), Zinc  Sulfate (220mg  x 14 days), Folic Acid  (1mg  x 30 days) MVI BID x 14 days, then transition to 1x/d  NUTRITION DIAGNOSIS:  Moderate Malnutrition related to chronic illness as evidenced by severe muscle depletion, moderate fat depletion. - remains applicable  GOAL:  Patient will meet greater than or equal to 90% of their needs - progressing  MONITOR:  TF tolerance, Skin, I & O's, Vent status, Labs  REASON FOR ASSESSMENT:  Consult Enteral/tube feeding initiation and management  ASSESSMENT:  Pt with hx of CKD3, COPD, CAD, hx CVA, and DM type 2 presented to ED from [facility] for cardiac cath.  Pt with ongoing AMS this admission. Rapid response called 6/12 and 6/13. On 6/13 pt ultimately intubated and transferred to ICU.   6/11 - admitted 6/13 - intubated 6/20 - extubated  Pt out of room at MRI at the time of assessment. Remains encephalopathic for unknown reasons. Neurology consulting and recommend psychiatric consult if MRI inconclusive. Continues to tolerate TF via cortrak.   All vitamin and minerals have resulted. Folate, B6, and zinc  being replaced.   Admit weight: 55.1 kg  Current weight: 60.1 kg   Intake/Output Summary (Last 24 hours) at 08/11/2023 1322 Last data filed at 08/11/2023 1100 Gross per 24 hour  Intake 3262.79 ml  Output 3750 ml  Net -487.21 ml  Net IO Since Admission: 13,117.76 mL [08/11/23 1322]  Drains/Lines: Cortrak UOP 3750mL x 24 hours  Average Meal Intake: 6/11: 20% intake x 1 recorded meals  Nutritionally Relevant Medications: Scheduled  Meds:  bethanechol  25 mg Per Tube TID   docusate  100 mg Per Tube BID   folic acid   1 mg Per Tube Daily   insulin  aspart  0-15 Units Subcutaneous Q4H   insulin  aspart  5 Units Subcutaneous Q4H   insulin  glargine-yfgn  15 Units Subcutaneous Daily   lactulose   30 g Per Tube Daily   levothyroxine   100 mcg Per Tube q morning   multivitamin with minerals  1 tablet Per Tube BID   polyethylene glycol  17 g Per Tube Daily   pyridOXINE  100 mg Per Tube Daily   sodium bicarbonate   1,300 mg Oral BID   zinc  sulfate (50mg  elemental zinc )  220 mg Per Tube Daily   Continuous Infusions:  feeding supplement (GLUCERNA 1.2 CAL) 55 mL/hr at 08/10/23 0700   PRN Meds: ondansetron   Labs Reviewed: Na 153, chloride 122 BUN 34, creatinine 1.48 CBG ranges from 144-181 mg/dL over the last 24 hours HgbA1c 4.8% (6/13)  Micronutrient Profile CRP 3.1, inflammation not high enough to skew labs. Results reported will be true serum levels Vitamin C - 0.5 (WNL) Vitamin D  - 82.63 (WNL) Vitamin E- alpha 15.9, gamma 3.9 (WNL) Vitamin B6 - 1.9 (low) 100mg  x 14 days supplementation added Vitamin B12 - 655 (WNL) Thiamine - 86.0 (WNL) Folate - 3.8 (low), 1mg  daily supplementation added Zinc - 42 (low) 220mg  x 14 days supplementation added Carnitine - pending  NUTRITION - FOCUSED PHYSICAL EXAM: Flowsheet Row Most Recent Value  Orbital Region Moderate depletion  Upper Arm Region No depletion  Thoracic and  Lumbar Region Mild depletion  Buccal Region Moderate depletion  Temple Region Mild depletion  Clavicle Bone Region Moderate depletion  Clavicle and Acromion Bone Region Moderate depletion  Scapular Bone Region Moderate depletion  Dorsal Hand Unable to assess  [edema]  Patellar Region Severe depletion  Anterior Thigh Region Severe depletion  Posterior Calf Region Severe depletion  Edema (RD Assessment) Moderate  [hands]  Hair Reviewed  [thin]  Eyes Unable to assess  Mouth Reviewed  [blood noted on lips]   Skin Reviewed  [pin point hemorrhages]  Nails Reviewed    Diet Order:   Diet Order     None       EDUCATION NEEDS:  Not appropriate for education at this time  Skin:  Skin Assessment: Reviewed RN Assessment  Last BM:  6/23 - type 7 - FMS in place  Height:  Ht Readings from Last 1 Encounters:  07/30/23 5' 5 (1.651 m)    Weight:  Wt Readings from Last 1 Encounters:  08/11/23 60.1 kg    Ideal Body Weight:  56.8 kg  BMI:  Body mass index is 22.05 kg/m.  Estimated Nutritional Needs:  Kcal:  1500-1700 kcal/d Protein:  75-90g/d Fluid:  >1.5L/d    Vernell Lukes, RD, LDN Registered Dietitian II Please reach out via secure chat

## 2023-08-11 NOTE — Progress Notes (Signed)
LTM EEG disconnected - no skin breakdown at unhook. [facility] notified.  

## 2023-08-11 NOTE — Progress Notes (Signed)
 NAME:  Sarah Davis, MRN:  [id number], DOB:  1960/08/02, LOS: 12 ADMISSION DATE:  07/30/2023, CONSULTATION DATE: 08/01/2023 REFERRING MD: Cardiology, CHIEF COMPLAINT: Acute respiratory distress  History of Present Illness:  63 year old female who underwent cardiac catheterization 07/31/2023 without acute interventions required.  She does have a history of polysubstance abuse, tobacco abuse, bipolar, thyroid disease and at the time of pulmonary critical care arriving at her bedside she was in acute respiratory failure and required intubation emergently.  She transferred to the intensive care unit.  She is currently on Levophed  drip.  Chest x-ray, ABG, CT of the head have been ordered at this time.  Pertinent  Medical History   Past Medical History:  Diagnosis Date   Anxiety    Bipolar 2 disorder (HCC)    Chronic hepatitis C virus genotype 1 infection (HCC)    Chronic pancreatitis (HCC)    CKD (chronic kidney disease) stage 4, GFR 15-29 ml/min (HCC)    COPD (chronic obstructive pulmonary disease) (HCC)    Coronary artery disease 11/2019   s/p PTCI  LCx & RCA. Complicated by Vfib arrest   DM (diabetes mellitus), type 2 (HCC)    HFrEF (heart failure with reduced ejection fraction) (HCC) 08/01/2023   EF 45-50%   Hyperparathyroidism, secondary (HCC)    Hypothyroid    Lithium toxicity    Marijuana abuse    NSTEMI (non-ST elevated myocardial infarction) (HCC) 07/30/2023   med management   Peripheral autonomic neuropathy due to DM [facility])    STEMI (ST elevation myocardial infarction) (HCC) 2021   Stroke [facility])    right hemiparesis   Substance abuse (HCC)    Tardive dyskinesia    Tobacco abuse    Significant Hospital Events: Including procedures, antibiotic start and stop dates in addition to other pertinent events   6/12 - CT negative for LVO, MRI negative. No seizures on EEG.  6/14 remains somnolent.  6/16 unresponsive, ammonia back in 250 range; started carnitine, increased lactulose  6/17  ammonia back in 70s, still minimally responsive, remaining off sedation 6/21 LFTs elevated 6/23 no improvement in mental status   Interim History / Subjective:  Unchanged, no improvement with ativan  for possible catatonia, Does not track or follow commands, EEG negative , odd nystagmus noted, dicussed with neurology, repeat MRI brain, may need to involve psych if non revealing  Objective    Blood pressure (!) 101/53, pulse 88, temperature 99.1 F (37.3 C), temperature source Oral, resp. rate (!) 25, height 5' 5 (1.651 m), weight 60.1 kg, SpO2 94%.        Intake/Output Summary (Last 24 hours) at 08/11/2023 9077 Last data filed at 08/11/2023 0400 Gross per 24 hour  Intake 1615.55 ml  Output 3750 ml  Net -2134.45 ml   Filed Weights   08/06/23 0554 08/07/23 0705 08/11/23 0324  Weight: 60.9 kg 59.4 kg 60.1 kg    Examination: Chronically ill appearing Opens eyes to voice Withdraws to pain in all 4 extremities +corneal, cough, gag Does not track or follow commands Non verbal.  Short burst horizontal nystagmus bilaterally  Labs WNL  Vit C WNL Vit D WNL Folate low B12 WNL  Resolved problem list:  Hypokalemia Acute hypoxemic respiratory failure  Assessment and Plan  Acute metabolic encephalopathy, presumed related to hyperammonemia but no improvement despite improved ammonia with carnitine supplementation Hyperammonemia with unclear etiology, no evidence of liver cirrhosis on imaging Possible carnitine deficiency Brain MRI without acute findings 6/4. 6/13 EEG showed no seizures, same on 6/16  and 6/23 with cEEG CT head was done on 6/13 and 6/19 no acute changes Avoid sedation Presupplementation carnitine level is pending Stop levocarnitine  as below Continue lactulose  for now, goal 500cc liquid stool daily (FMS) Appreciate neurology assistance, repeat brain MRI with nystagmus   Leukocytosis, improved.  No fevers Cultures have been negative Keep off antibiotics No  central line or Foley  AKI on chronic kidney disease (CKD) stage 3A Hypernatremia Acute urinary retention Place foley Increase free water  Reduce frequency of NaBicarb  Thrombocytopenia likely due to critical illness;   HIT was ruled out - platelets improved over 50 so can resume dvt ppx  Transaminitis - holding statin althout this seems less likely as chronic med - stopping levocarnitine  as has been linked to DILI and is most recent medication addition  CAD with acute NSTEMI (non-ST elevated myocardial infarction) ; LHC demonstrated chronic occlusion of LAD HFrEF> not sure this was all acute- concern for stress induced CM vs chronic ICM Likely demand related based on absence of new lesions.  Appreciate cardiology follow-up Started on Coreg  Continue aspirin  and holding statin due to transaminitis GDMT as tolerated  Hypothyroidism Continue Synthroid   Moderate malnutrition Continue dietary supplements   Best Practice (right click and Reselect all SmartList Selections daily)   Diet/type: NPO with tube feeds DVT prophylaxis SCD Pressure ulcer(s): defer to RN assessment GI prophylaxis: H2B Lines: N/A Foley:  N/A Code Status:  full code Last date of multidisciplinary goals of care discussion: 6/22 mother Levorn updated by phone in the morning.   I spent 55 minutes in total visit time for this patient, with more than 50% spent counseling/coordinating care.  [name], MD Pulmonary and Critical Care Medicine [facility] 08/11/2023 9:22 AM See AMION  If no response to pager, please call critical care on call (see AMION) until 7pm After 7:00 pm call Elink

## 2023-08-11 NOTE — Progress Notes (Addendum)
 Progress Note  Patient Name: Sarah Davis Date of Encounter: 08/11/2023  Primary Cardiologist:   None   Subjective   Eyes open but no response to questions.   Inpatient Medications    Scheduled Meds:  arformoterol   15 mcg Nebulization BID   aspirin   81 mg Per Tube Daily   Chlorhexidine  Gluconate Cloth  6 each Topical Daily   docusate  100 mg Per Tube BID   folic acid   1 mg Per Tube Daily   free water   300 mL Per Tube Q4H   heparin  injection (subcutaneous)  5,000 Units Subcutaneous Q8H   insulin  aspart  0-15 Units Subcutaneous Q4H   insulin  aspart  5 Units Subcutaneous Q4H   insulin  glargine-yfgn  15 Units Subcutaneous Daily   lactulose   30 g Per Tube Daily   levothyroxine   100 mcg Per Tube q morning   multivitamin with minerals  1 tablet Per Tube BID   [START ON 08/19/2023] multivitamin with minerals  1 tablet Oral Daily   mouth rinse  15 mL Mouth Rinse Q4H   polyethylene glycol  17 g Per Tube Daily   pyridOXINE  100 mg Per Tube Daily   revefenacin   175 mcg Nebulization Daily   sodium bicarbonate   1,300 mg Oral Q8H   zinc  sulfate (50mg  elemental zinc )  220 mg Per Tube Daily   Continuous Infusions:  feeding supplement (GLUCERNA 1.2 CAL) 55 mL/hr at 08/10/23 0700   PRN Meds: acetaminophen , albuterol , nitroGLYCERIN , ondansetron  (ZOFRAN ) IV, mouth rinse, mouth rinse, sodium chloride  flush   Vital Signs    Vitals:   08/11/23 0400 08/11/23 0500 08/11/23 0600 08/11/23 0736  BP: (!) 141/83 112/64 (!) 101/53   Pulse: 99 100 91 88  Resp: (!) 39 (!) 34 (!) 24 (!) 25  Temp:      TempSrc:      SpO2: (!) 88% 91% 92% 94%  Weight:      Height:        Intake/Output Summary (Last 24 hours) at 08/11/2023 0749 Last data filed at 08/11/2023 0400 Gross per 24 hour  Intake 5509.88 ml  Output 3750 ml  Net 1759.88 ml   Filed Weights   08/06/23 0554 08/07/23 0705 08/11/23 0324  Weight: 60.9 kg 59.4 kg 60.1 kg    Telemetry    NSR - Personally Reviewed  ECG    NA -  Personally Reviewed  Physical Exam   GEN: No acute distress.   Neck: No  JVD Cardiac: RRR, no murmurs, rubs, or gallops.  Respiratory: Clear  to auscultation bilaterally. GI: Soft, nontender, non-distended  MS:    Severe diffuse upper extremity edema; No deformity. Neuro:  Nonfocal but not responsive to questions.  Moves extremities.    Labs    Chemistry Recent Labs  Lab 08/08/23 0409 08/09/23 0238 08/10/23 0324  NA 149* 150* 147*  K 4.2 3.5 3.2*  CL 116* 118* 117*  CO2 23 25 21*  GLUCOSE 200* 196* 153*  BUN 24* 25* 31*  CREATININE 1.17* 1.28* 1.63*  CALCIUM  8.4* 8.5* 8.2*  PROT 5.1* 5.1* 4.9*  ALBUMIN 1.9* 1.7* <1.5*  AST 30 196* 680*  ALT 20 88* 336*  ALKPHOS 71 89 125  BILITOT 0.4 0.3 0.7  GFRNONAA 52* 47* 35*  ANIONGAP 10 7 9      Hematology Recent Labs  Lab 08/08/23 0409 08/09/23 0238 08/10/23 0324  WBC 19.4* 18.1* 11.7*  RBC 3.85* 3.71* 3.55*  HGB 10.2* 10.0* 9.4*  HCT 33.4*  32.3* 31.6*  MCV 86.8 87.1 89.0  MCH 26.5 27.0 26.5  MCHC 30.5 31.0 29.7*  RDW 19.8* 19.7* 19.4*  PLT 45* 66* 94*    Cardiac EnzymesNo results for input(s): TROPONINI in the last 168 hours. No results for input(s): TROPIPOC in the last 168 hours.   BNPNo results for input(s): BNP, PROBNP in the last 168 hours.   DDimer No results for input(s): DDIMER in the last 168 hours.   Radiology    No results found.  Cardiac Studies   Echo:   1. Left ventricular ejection fraction, by estimation, is 45 to 50%. The  left ventricle has mildly decreased function. The left ventricle  demonstrates regional wall motion abnormalities (see scoring  diagram/findings for description). There is mild left  ventricular hypertrophy. Left ventricular diastolic parameters are  consistent with Grade I diastolic dysfunction (impaired relaxation).   2. Right ventricular systolic function is normal. The right ventricular  size is normal.   3. A small pericardial effusion is present.    4. The mitral valve is normal in structure. Trivial mitral valve  regurgitation.   5. The aortic valve was not well visualized. Aortic valve regurgitation  is not visualized. Mild aortic valve stenosis. Vmax 2.0 m/s, MG , DI  0.52   Patient Profile     63 y.o. female with CAD, hypertension, tobacco use, hyperlipidemia, schizophrenia/bipolar disorder who was admitted with NSTEMI.   Assessment & Plan    NSTEMI:   History of STEMI with stenting of distal LCx, RCA in 2021 post complicated by V-fib arrest.  Echocardiogram at [facility] showed EF 35 to 40%.  Cath on 6/12 patent LCx and RCA stents, chronic occlusion in mid LAD with collaterals, medical management.   Continue ASA.       Acute systolic heart failure: Echocardiogram at [facility] with EF 35 to 40%.  Echo repeated on 6/13 and showed EF 45 to 50% with apical hypokinesis.  On beta blocker but BP fluctuating and unable to titrate meds.  Beta blocker on hold.   Intake and output likely incomplete with for hospitalization but positive 1.8 liters yesterday and possibly net positive 11 liters since admission.  Being given free water  for hypernatremia and increased creat.  Oxygenating well despite volume.  Follow closely.    AMS/Acute respiratory failure: Intubated, management per PCCM   AKI: Creatinine 1.63 yesterday and increasing over the past 4 days.  Labs today pending.   For questions or updates, please contact [facility] Please consult [email or web address] for contact info under Cardiology/STEMI.   Signed, Lynwood Schilling, MD  08/11/2023, 7:49 AM

## 2023-08-11 NOTE — Procedures (Addendum)
 Patient Name: Jaylnn Ullery  MRN: [id number]  Epilepsy Attending: Arlin MALVA Krebs  Referring Physician/Provider: Khaliqdina, Salman, MD  Duration: 08/10/2023 1112 to 08/11/2023 9044   Patient history: 62yo F with ams. EEG to evaluate for seizure.   Level of alertness: awake, asleep    AEDs during EEG study: None    Technical aspects: This EEG study was done with scalp electrodes positioned according to the 10-20 International system of electrode placement. Electrical activity was reviewed with band pass filter of 1-70Hz , sensitivity of 7 uV/mm, display speed of 74mm/sec with a 60Hz  notched filter applied as appropriate. EEG data were recorded continuously and digitally stored.  Video monitoring was available and reviewed as appropriate.  Description: The posterior dominant rhythm consists of 8-9 Hz activity of moderate voltage (25-35 uV) seen predominantly in posterior head regions, symmetric and reactive to eye opening and eye closing.  Sleep was characterized by vertex waves, sleep spindles (12 to 14 Hz), maximal frontocentral region. EEG showed  intermittent generalized 3 to 6 Hz theta-delta slowing. Hyperventilation and photic stimulation were not performed.    Of note, parts of study were technically difficult due to significant myogenic artifact.   ABNORMALITY - Intermittent slow, generalized  IMPRESSION: This technically difficult study is suggestive of mild diffuse encephalopathy. No seizures or epileptiform discharges were seen throughout the recording.  Tatanisha Cuthbert O Deyanna Mctier

## 2023-08-12 DIAGNOSIS — R7401 Elevation of levels of liver transaminase levels: Secondary | ICD-10-CM | POA: Diagnosis not present

## 2023-08-12 DIAGNOSIS — G9340 Encephalopathy, unspecified: Secondary | ICD-10-CM

## 2023-08-12 DIAGNOSIS — R569 Unspecified convulsions: Secondary | ICD-10-CM | POA: Diagnosis not present

## 2023-08-12 DIAGNOSIS — N1831 Chronic kidney disease, stage 3a: Secondary | ICD-10-CM | POA: Diagnosis not present

## 2023-08-12 DIAGNOSIS — I214 Non-ST elevation (NSTEMI) myocardial infarction: Secondary | ICD-10-CM | POA: Diagnosis not present

## 2023-08-12 DIAGNOSIS — G9341 Metabolic encephalopathy: Secondary | ICD-10-CM | POA: Diagnosis not present

## 2023-08-12 LAB — COMPREHENSIVE METABOLIC PANEL WITH GFR
ALT: 234 U/L — ABNORMAL HIGH (ref 0–44)
AST: 180 U/L — ABNORMAL HIGH (ref 15–41)
Albumin: <1.5 g/dL — ABNORMAL LOW (ref 3.5–5.0)
Alkaline Phosphatase: 150 U/L — ABNORMAL HIGH (ref 38–126)
Anion gap: 10 (ref 5–15)
BUN: 35 mg/dL — ABNORMAL HIGH (ref 8–23)
CO2: 25 mmol/L (ref 22–32)
Calcium: 8.6 mg/dL — ABNORMAL LOW (ref 8.9–10.3)
Chloride: 115 mmol/L — ABNORMAL HIGH (ref 98–111)
Creatinine, Ser: 1.31 mg/dL — ABNORMAL HIGH (ref 0.44–1.00)
GFR, Estimated: 46 mL/min — ABNORMAL LOW (ref >60)
Glucose, Bld: 162 mg/dL — ABNORMAL HIGH (ref 70–99)
Potassium: 4.1 mmol/L (ref 3.5–5.1)
Sodium: 150 mmol/L — ABNORMAL HIGH (ref 135–145)
Total Bilirubin: 0.3 mg/dL (ref 0.0–1.2)
Total Protein: 5.2 g/dL — ABNORMAL LOW (ref 6.5–8.1)

## 2023-08-12 LAB — GLUCOSE, CAPILLARY
Glucose-Capillary: 173 mg/dL — ABNORMAL HIGH (ref 70–99)
Glucose-Capillary: 180 mg/dL — ABNORMAL HIGH (ref 70–99)
Glucose-Capillary: 156 mg/dL — ABNORMAL HIGH (ref 70–99)
Glucose-Capillary: 130 mg/dL — ABNORMAL HIGH (ref 70–99)
Glucose-Capillary: 87 mg/dL (ref 70–99)
Glucose-Capillary: 142 mg/dL — ABNORMAL HIGH (ref 70–99)

## 2023-08-12 LAB — CARNITINE, FREE AND TOTAL
Carnitine, Esterfied/Free: UNDETERMINED ratio
Carnitine, Free: >400 umol/L — ABNORMAL HIGH (ref 20–55)
Carnitine, Total: >400 umol/L — ABNORMAL HIGH (ref 27–73)

## 2023-08-12 MED ORDER — ZINC SULFATE 220 (50 ZN) MG PO CAPS
220.0000 mg | ORAL_CAPSULE | Freq: Every day | ORAL | Status: DC
  Administered 2023-08-13 – 2023-08-16 (×4): 220 mg
  Filled 2023-08-12 (×4): qty 1

## 2023-08-12 MED ORDER — LACTULOSE 10 GM/15ML PO SOLN
30.0000 g | Freq: Every day | ORAL | Status: DC
  Administered 2023-08-13 – 2023-08-16 (×4): 30 g
  Filled 2023-08-12 (×4): qty 45

## 2023-08-12 MED ORDER — DOCUSATE SODIUM 50 MG/5ML PO LIQD
100.0000 mg | Freq: Two times a day (BID) | ORAL | Status: DC
  Administered 2023-08-12 – 2023-08-16 (×8): 100 mg
  Filled 2023-08-12 (×9): qty 10

## 2023-08-12 MED ORDER — FOLIC ACID 1 MG PO TABS
1.0000 mg | ORAL_TABLET | Freq: Every day | ORAL | Status: DC
  Administered 2023-08-13 – 2023-08-16 (×4): 1 mg
  Filled 2023-08-12 (×4): qty 1

## 2023-08-12 MED ORDER — SODIUM BICARBONATE 650 MG PO TABS
1300.0000 mg | ORAL_TABLET | Freq: Two times a day (BID) | ORAL | Status: DC
  Administered 2023-08-12 – 2023-08-13 (×3): 1300 mg
  Filled 2023-08-12 (×3): qty 2

## 2023-08-12 MED ORDER — THIAMINE HCL 100 MG/ML IJ SOLN
500.0000 mg | Freq: Three times a day (TID) | INTRAVENOUS | Status: AC
  Administered 2023-08-12 – 2023-08-15 (×9): 500 mg via INTRAVENOUS
  Filled 2023-08-12 (×4): qty 5
  Filled 2023-08-12: qty 466.67
  Filled 2023-08-12 (×3): qty 5
  Filled 2023-08-12: qty 500
  Filled 2023-08-12: qty 5

## 2023-08-12 MED ORDER — ADULT MULTIVITAMIN W/MINERALS CH
1.0000 | ORAL_TABLET | Freq: Two times a day (BID) | ORAL | Status: DC
  Administered 2023-08-12 – 2023-08-16 (×8): 1
  Filled 2023-08-12 (×8): qty 1

## 2023-08-12 MED ORDER — ADULT MULTIVITAMIN W/MINERALS CH: 1.0000 | ORAL_TABLET | Freq: Every day | ORAL | Status: DC

## 2023-08-12 MED ORDER — POLYETHYLENE GLYCOL 3350 17 G PO PACK
17.0000 g | PACK | Freq: Every day | ORAL | Status: DC
  Administered 2023-08-13 – 2023-08-16 (×4): 17 g
  Filled 2023-08-12 (×4): qty 1

## 2023-08-12 MED ORDER — LEVOTHYROXINE SODIUM 100 MCG PO TABS
100.0000 ug | ORAL_TABLET | Freq: Every morning | ORAL | Status: DC
  Administered 2023-08-13 – 2023-08-16 (×4): 100 ug
  Filled 2023-08-12 (×4): qty 1

## 2023-08-12 MED ORDER — BETHANECHOL CHLORIDE 25 MG PO TABS
25.0000 mg | ORAL_TABLET | Freq: Three times a day (TID) | ORAL | Status: DC
  Administered 2023-08-12 – 2023-08-16 (×13): 25 mg
  Filled 2023-08-12 (×2): qty 3
  Filled 2023-08-12: qty 1
  Filled 2023-08-12 (×4): qty 3
  Filled 2023-08-12: qty 2.5
  Filled 2023-08-12: qty 3
  Filled 2023-08-12 (×4): qty 1

## 2023-08-12 MED ORDER — ASPIRIN 81 MG PO CHEW
81.0000 mg | CHEWABLE_TABLET | Freq: Every day | ORAL | Status: DC
  Administered 2023-08-13 – 2023-08-16 (×4): 81 mg
  Filled 2023-08-12 (×4): qty 1

## 2023-08-12 MED ORDER — ACETAMINOPHEN 325 MG PO TABS
650.0000 mg | ORAL_TABLET | ORAL | Status: DC | PRN
Start: 2023-08-12 — End: 2023-08-16
  Administered 2023-08-14 (×2): 650 mg
  Filled 2023-08-12 (×2): qty 2

## 2023-08-12 MED ORDER — VITAMIN B-6 100 MG PO TABS
100.0000 mg | ORAL_TABLET | Freq: Every day | ORAL | Status: DC
  Administered 2023-08-13 – 2023-08-16 (×4): 100 mg
  Filled 2023-08-12 (×4): qty 1

## 2023-08-12 NOTE — Progress Notes (Addendum)
 NAME:  Sarah Davis, MRN:  [id number], DOB:  [birth year], LOS: 13 ADMISSION DATE:  07/30/2023, CONSULTATION DATE: 08/01/2023 REFERRING MD: Cardiology, CHIEF COMPLAINT: Acute respiratory distress  History of Present Illness:  63 year old female who underwent cardiac catheterization 07/31/2023 without acute interventions required.  She does have a history of polysubstance abuse, tobacco abuse, bipolar, thyroid disease and at the time of pulmonary critical care arriving at her bedside she was in acute respiratory failure and required intubation emergently.  She transferred to the intensive care unit.  She is currently on Levophed  drip.  Chest x-ray, ABG, CT of the head have been ordered at this time.  Pertinent  Medical History   Past Medical History:  Diagnosis Date   Anxiety    Bipolar 2 disorder (HCC)    Chronic hepatitis C virus genotype 1 infection (HCC)    Chronic pancreatitis (HCC)    CKD (chronic kidney disease) stage 4, GFR 15-29 ml/min (HCC)    COPD (chronic obstructive pulmonary disease) (HCC)    Coronary artery disease 11/2019   s/p PTCI  LCx & RCA. Complicated by Vfib arrest   DM (diabetes mellitus), type 2 (HCC)    HFrEF (heart failure with reduced ejection fraction) (HCC) 08/01/2023   EF 45-50%   Hyperparathyroidism, secondary (HCC)    Hypothyroid    Lithium toxicity    Marijuana abuse    NSTEMI (non-ST elevated myocardial infarction) (HCC) 07/30/2023   med management   Peripheral autonomic neuropathy due to DM [facility])    STEMI (ST elevation myocardial infarction) (HCC) 2021   Stroke [facility])    right hemiparesis   Substance abuse (HCC)    Tardive dyskinesia    Tobacco abuse    Significant Hospital Events: Including procedures, antibiotic start and stop dates in addition to other pertinent events   6/12 - CT negative for LVO, MRI negative. No seizures on EEG.  6/14 remains somnolent.  6/16 unresponsive, ammonia back in 250 range; started carnitine, increased lactulose  6/17  ammonia back in 70s, still minimally responsive, remaining off sedation 6/21 LFTs elevated 6/23 no improvement in mental status, MRI ok, carnitine stopped as rising LFTs, possible DILI  Interim History / Subjective:  Unchanged, MRI without new or acute finding. Psych consult.  Objective    Blood pressure (!) 110/59, pulse 82, temperature 98.4 F (36.9 C), temperature source Oral, resp. rate (!) 30, height 5' 5 (1.651 m), weight 60.1 kg, SpO2 95%.        Intake/Output Summary (Last 24 hours) at 08/12/2023 0842 Last data filed at 08/12/2023 0800 Gross per 24 hour  Intake 2520 ml  Output 3850 ml  Net -1330 ml   Filed Weights   08/07/23 0705 08/11/23 0324 08/12/23 0500  Weight: 59.4 kg 60.1 kg 60.1 kg    Examination: Chronically ill appearing Opens eyes to voice Withdraws to pain in all 4 extremities +corneal, cough, gag Does not track or follow commands Non verbal.  Short burst horizontal nystagmus bilaterally  Labs WNL  Vit C WNL Vit D WNL Folate low B12 WNL  Resolved problem list:  Hypokalemia Acute hypoxemic respiratory failure  Assessment and Plan  Acute metabolic encephalopathy, presumed related to hyperammonemia but no improvement despite improved ammonia with carnitine supplementation Hyperammonemia with unclear etiology, no evidence of liver cirrhosis on imaging Possible carnitine deficiency Brain MRI without acute findings 6/4 and on 6/23. 6/13 EEG showed no seizures, same on 6/16 and 6/23 with cEEG CT head was done on 6/13 and 6/19 no acute  changes Avoid sedation Presupplementation carnitine level is pending Stop levocarnitine  as below Continue lactulose  for now, goal 500cc liquid stool daily (FMS) Appreciate neurology assistance - discussed case in person with Neurology 6/24 Given no clear organic cause (although suspected metabolic perhaps very slowly improving), Psychiatry consulted 6/24 to aid in resuming psychiatric meds  Leukocytosis, improved.   No fevers Cultures have been negative Keep off antibiotics  AKI on chronic kidney disease (CKD) stage 3A Hypernatremia Acute urinary retention Place foley Increased free water  6/23 Reduce frequency of NaBicarb 6/23  Thrombocytopenia likely due to critical illness;   HIT was ruled out - platelets improved over 50 so can resume dvt ppx  Transaminitis - holding statin although this seems less likely as chronic med - stopped levocarnitine  6/23 as has been linked to DILI and is most recent medication addition  CAD with acute NSTEMI (non-ST elevated myocardial infarction) ; LHC demonstrated chronic occlusion of LAD HFrEF> not sure this was all acute- concern for stress induced CM vs chronic ICM Likely demand related based on absence of new lesions.  Continue aspirin  and holding statin due to transaminitis GDMT as tolerated - Coreg  stopped 6/23 with low BP  Hypothyroidism Continue Synthroid   Moderate malnutrition Continue dietary supplements   Best Practice (right click and Reselect all SmartList Selections daily)   Diet/type: NPO with tube feeds DVT prophylaxis SCD Pressure ulcer(s): defer to RN assessment GI prophylaxis: H2B Lines: N/A Foley:  N/A Code Status:  full code Last date of multidisciplinary goals of care discussion: 6/22 mother Levorn updated by phone in the morning.   I spent 51 minutes in total visit time for this patient, with more than 50% spent counseling/coordinating care.  [name], MD Pulmonary and Critical Care Medicine [facility] 08/12/2023 8:42 AM See AMION  If no response to pager, please call critical care on call (see AMION) until 7pm After 7:00 pm call Elink

## 2023-08-12 NOTE — Plan of Care (Signed)

## 2023-08-12 NOTE — Progress Notes (Signed)
 NEUROLOGY CONSULT FOLLOW UP NOTE   Date of service: August 12, 2023 Patient Name: Sarah Davis MRN:  [id number] DOB:  1960-11-22  Interval Hx/subjective   No family at the bedside. No new neurological events overnight.  Patient is laying in the bed in NAD. She is awake and alert. She stated her name, was unable to answer any other orientation questions, and attempted to stick out her tongue. She did not follow any other commands.   Vitals   Vitals:   08/12/23 0500 08/12/23 0600 08/12/23 0710 08/12/23 0748  BP: 91/68 (!) 110/59    Pulse: 83 84  82  Resp: (!) 24 (!) 26  (!) 30  Temp:   98.4 F (36.9 C)   TempSrc:   Oral   SpO2: 92% 94%  95%  Weight: 60.1 kg     Height:         Body mass index is 22.05 kg/m.  Physical Exam   Constitutional: critically ill, laying in bed in NAD.  Eyes: No scleral injection.  HENT: No OP obstrucion.  Head: Normocephalic.  Cardiovascular: Normal rate and regular rhythm.  Respiratory: Effort normal, non-labored breathing.  GI: Soft.  No distension. There is no tenderness.  Skin: WDI.  Ext: generalized edema noted throughout   Neurologic Examination   Eyes are open. Stated her name, unable to answer other orientation questions. Attempted to stick out her tongue, did not follow any other commands.face is symmetric.  Bilateral blink to threat. Withdraws in all 4 extremities   Medications  Current Facility-Administered Medications:    acetaminophen  (TYLENOL ) tablet 650 mg, 650 mg, Oral, Q4H PRN, Meng, Hao, PA   albuterol  (PROVENTIL ) (2.5 MG/3ML) 0.083% nebulizer solution 2.5 mg, 2.5 mg, Nebulization, Q4H PRN, Minor, Elsie RAMAN, NP   arformoterol  (BROVANA ) nebulizer solution 15 mcg, 15 mcg, Nebulization, BID, Gretta Leita SQUIBB, DO, 15 mcg at 08/12/23 9251   aspirin  chewable tablet 81 mg, 81 mg, Oral, Daily, Hunsucker, Donnice SAUNDERS, MD, 81 mg at 08/12/23 0916   bethanechol (URECHOLINE) tablet 25 mg, 25 mg, Oral, TID, Hunsucker, Donnice SAUNDERS, MD, 25 mg at  08/12/23 9081   Chlorhexidine  Gluconate Cloth 2 % PADS 6 each, 6 each, Topical, Daily, Agarwala, Ravi, MD, 6 each at 08/12/23 0931   docusate (COLACE) 50 MG/5ML liquid 100 mg, 100 mg, Oral, BID, Hunsucker, Donnice SAUNDERS, MD   feeding supplement (GLUCERNA 1.2 CAL) liquid 1,000 mL, 1,000 mL, Per Tube, Continuous, Gretta Leita SQUIBB, DO, Last Rate: 55 mL/hr at 08/12/23 0800, Infusion Verify at 08/12/23 0800   folic acid  (FOLVITE ) tablet 1 mg, 1 mg, Oral, Daily, Hunsucker, Donnice SAUNDERS, MD, 1 mg at 08/12/23 9083   free water  100 mL, 100 mL, Per Tube, Q1H, Hunsucker, Donnice SAUNDERS, MD, 100 mL at 08/12/23 0930   heparin  injection 5,000 Units, 5,000 Units, Subcutaneous, Q8H, Desai, Nikita S, MD, 5,000 Units at 08/12/23 0548   insulin  aspart (novoLOG ) injection 0-15 Units, 0-15 Units, Subcutaneous, Q4H, Gretta Leita SQUIBB, DO, 3 Units at 08/12/23 0754   insulin  aspart (novoLOG ) injection 5 Units, 5 Units, Subcutaneous, Q4H, Desai, Nikita S, MD, 5 Units at 08/12/23 0755   insulin  glargine-yfgn (SEMGLEE ) injection 15 Units, 15 Units, Subcutaneous, Daily, Paliwal, Aditya, MD, 15 Units at 08/12/23 0915   lactulose  (CHRONULAC ) 10 GM/15ML solution 30 g, 30 g, Oral, Daily, Hunsucker, Donnice SAUNDERS, MD, 30 g at 08/12/23 9075   levothyroxine  (SYNTHROID ) tablet 100 mcg, 100 mcg, Oral, q morning, Hunsucker, Donnice SAUNDERS, MD, 100 mcg at 08/12/23 [id number]   [  START ON 08/19/2023] multivitamin with minerals tablet 1 tablet, 1 tablet, Oral, Daily, Tanda Powell ORN, RPH   multivitamin with minerals tablet 1 tablet, 1 tablet, Oral, BID, Hunsucker, Donnice SAUNDERS, MD, 1 tablet at 08/12/23 0757   nitroGLYCERIN  (NITROSTAT ) SL tablet 0.4 mg, 0.4 mg, Sublingual, Q5 Min x 3 PRN, Meng, Hao, PA   ondansetron  (ZOFRAN ) injection 4 mg, 4 mg, Intravenous, Q6H PRN, Meng, Hao, PA   Oral care mouth rinse, 15 mL, Mouth Rinse, PRN, Arlinda Ranks, MD   Oral care mouth rinse, 15 mL, Mouth Rinse, PRN, Gretta Leita SQUIBB, DO   Oral care mouth rinse, 15 mL, Mouth Rinse, Q4H, Chand,  Sudham, MD, 15 mL at 08/12/23 0800   polyethylene glycol (MIRALAX  / GLYCOLAX ) packet 17 g, 17 g, Oral, Daily, Laron Agent, RPH   pyridOXINE (VITAMIN B6) tablet 100 mg, 100 mg, Oral, Daily, Hunsucker, Donnice SAUNDERS, MD, 100 mg at 08/12/23 9070   revefenacin  (YUPELRI ) nebulizer solution 175 mcg, 175 mcg, Nebulization, Daily, Gretta Leita SQUIBB, DO, 175 mcg at 08/12/23 9251   sodium bicarbonate  tablet 1,300 mg, 1,300 mg, Oral, BID, Hunsucker, Donnice SAUNDERS, MD, 1,300 mg at 08/12/23 9082   sodium chloride  flush (NS) 0.9 % injection 3 mL, 3 mL, Intravenous, PRN, Patwardhan, Manish J, MD   thiamine  (VITAMIN B1) 500 mg in sodium chloride  0.9 % 50 mL IVPB, 500 mg, Intravenous, Q8H, Hunsucker, Donnice SAUNDERS, MD   zinc  sulfate (50mg  elemental zinc ) capsule 220 mg, 220 mg, Oral, Daily, Hunsucker, Donnice SAUNDERS, MD, 220 mg at 08/12/23 0916  Labs and Diagnostic Imaging   CBC:  Recent Labs  Lab 08/09/23 0238 08/10/23 0324  WBC 18.1* 11.7*  HGB 10.0* 9.4*  HCT 32.3* 31.6*  MCV 87.1 89.0  PLT 66* 94*    Basic Metabolic Panel:  Lab Results  Component Value Date   NA 153 (H) 08/11/2023   K 4.8 08/11/2023   CO2 25 08/11/2023   GLUCOSE 145 (H) 08/11/2023   BUN 34 (H) 08/11/2023   CREATININE 1.48 (H) 08/11/2023   CALCIUM  7.8 (L) 08/11/2023   GFRNONAA 40 (L) 08/11/2023   GFRAA 36 (L) 09/05/2018   Lipid Panel:  Lab Results  Component Value Date   LDLCALC 47 08/01/2023   HgbA1c:  Lab Results  Component Value Date   HGBA1C 4.8 08/01/2023   Urine Drug Screen:     Component Value Date/Time   LABOPIA NONE DETECTED 06/19/2017 2306   COCAINSCRNUR NONE DETECTED 06/19/2017 2306   LABBENZ POSITIVE (A) 06/19/2017 2306   AMPHETMU POSITIVE (A) 06/19/2017 2306   THCU POSITIVE (A) 06/19/2017 2306   LABBARB NONE DETECTED 06/19/2017 2306    Alcohol Level     Component Value Date/Time   ETH <10 09/06/2018 0014   INR  Lab Results  Component Value Date   INR 1.0 02/28/2016   APTT  Lab Results  Component Value  Date   APTT 132 (H) 07/30/2023     CT Head without contrast(Personally reviewed): CTH was negative for a large hypodensity concerning for a large territory infarct or hyperdensity concerning for an ICH   CT angio Head and Neck with contrast(Personally reviewed): CTA neck:   1. Common carotid and internal carotid arteries patent within the neck without stenosis. Mild atherosclerotic plaque bilaterally, as described. 2. Vertebral arteries patent within the neck without stenosis or significant atherosclerotic disease. 3. Aortic Atherosclerosis (ICD10-I70.0).   CTA head:   1. No proximal intracranial large vessel occlusion or high-grade proximal arterial stenosis identified. 2.  Atherosclerotic plaque within the intracranial internal carotid arteries with no more than mild stenosis.   MRI Brain(Personally reviewed): 1. No acute intracranial abnormality. 2. Moderately advanced scattered cortical and subcortical T2 and FLAIR hyperintensities bilaterally for age. This likely reflects the sequelae of chronic microvascular ischemia. 3. 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   MRI brain 6/23:  No acute intracranial abnormality. 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 No seizures or epileptiform discharges were seen throughout the recording.   cEEG 6/22-6/23: This technically difficult study is suggestive of mild diffuse encephalopathy. No seizures or epileptiform discharges were seen throughout the recording.   Labs:  RPR/HIV non reactive Free T4 0.85 Carboxyhemoglobin pending  B12 655 Vitamin E 15.9 Zinc  42  Folate 3.8 Vitamin D  82.63 Vitamin C 0.5 Vitamin B1 86 Vitamin B6 1.9   Assessment   Sharion Grieves is a 63 y.o. female with hx of polysubstance abuse, tobacco abuse, bipolar, thyroid disease who initially was admitted from [facility] for a cardiac  cath, no interventions performed. Noted to be confused afterwards and neurology consulted. Initially significantly hyperammonemic. However, this has resolved now but has persistent encephalopathy and neurology reconsulted. On exam, opens eyes wide and keeps them open to voice, stares through th examiner, not making eye contact. Arms fall to the side when held up above her head. Withdraws BL lower extremities.  Haphazard nystagmus noted on exam today.  Broad differentials included seizures.  EEGs including continuous EEG have been unremarkable. Metabolic derangements are also in differentials-Vitamin B6 and folate levels as well as zinc  levels were low.  Ammonia has normalized.   Encephalopathy labs-B12, RPR and HIV unremarkable.  TSH normal Given extensive psychiatric history, primary psychiatric etiology should also be entertained if the workup below remains unremarkable    Recommendations   - Delirium precautions  - recommend consulting psychiatry for medication assistance.  - Neurology will sign off. Please call with questions or concerns   Plan discussed with Dr. Annella  ______________________________________________________________________   Signed, Karna DELENA Geralds, NP [facility]  Attending Neurohospitalist Addendum Patient seen and examined with APP/Resident. Agree with the history and physical as documented above. Agree with the plan as documented, which I helped formulate. I have independently reviewed the chart, obtained history, review of systems and examined the patient.I have personally reviewed pertinent head/neck/spine imaging (CT/MRI). Please feel free to call with any questions.  -- Eligio Lav, MD Neurologist [facility] Pager: [phone]

## 2023-08-12 NOTE — Progress Notes (Signed)
 Received consult on behalf of patient. Assessed current PIV and flushed x4. No infiltration noted at this time. Bilateral upper extremities with +2 & +3 edema. Pt only ordered thiamine  at this time, therefore only needs one PIV. Educated Charity fundraiser to frequently re assess for increased swelling to L forearm from PIV. No other appropriate veins seen at this time.

## 2023-08-13 DIAGNOSIS — R7401 Elevation of levels of liver transaminase levels: Secondary | ICD-10-CM | POA: Diagnosis not present

## 2023-08-13 DIAGNOSIS — I214 Non-ST elevation (NSTEMI) myocardial infarction: Secondary | ICD-10-CM | POA: Diagnosis not present

## 2023-08-13 DIAGNOSIS — G9341 Metabolic encephalopathy: Secondary | ICD-10-CM | POA: Diagnosis not present

## 2023-08-13 DIAGNOSIS — N1831 Chronic kidney disease, stage 3a: Secondary | ICD-10-CM | POA: Diagnosis not present

## 2023-08-13 LAB — COMPREHENSIVE METABOLIC PANEL WITH GFR
ALT: 192 U/L — ABNORMAL HIGH (ref 0–44)
AST: 160 U/L — ABNORMAL HIGH (ref 15–41)
Albumin: <1.5 g/dL — ABNORMAL LOW (ref 3.5–5.0)
Alkaline Phosphatase: 160 U/L — ABNORMAL HIGH (ref 38–126)
Anion gap: 10 (ref 5–15)
BUN: 31 mg/dL — ABNORMAL HIGH (ref 8–23)
CO2: 21 mmol/L — ABNORMAL LOW (ref 22–32)
Calcium: 7.6 mg/dL — ABNORMAL LOW (ref 8.9–10.3)
Chloride: 111 mmol/L (ref 98–111)
Creatinine, Ser: 1.28 mg/dL — ABNORMAL HIGH (ref 0.44–1.00)
GFR, Estimated: 47 mL/min — ABNORMAL LOW (ref >60)
Glucose, Bld: 168 mg/dL — ABNORMAL HIGH (ref 70–99)
Potassium: 4.6 mmol/L (ref 3.5–5.1)
Sodium: 142 mmol/L (ref 135–145)
Total Bilirubin: 0.4 mg/dL (ref 0.0–1.2)
Total Protein: 4.3 g/dL — ABNORMAL LOW (ref 6.5–8.1)

## 2023-08-13 LAB — GLUCOSE, CAPILLARY
Glucose-Capillary: 156 mg/dL — ABNORMAL HIGH (ref 70–99)
Glucose-Capillary: 182 mg/dL — ABNORMAL HIGH (ref 70–99)
Glucose-Capillary: 168 mg/dL — ABNORMAL HIGH (ref 70–99)
Glucose-Capillary: 188 mg/dL — ABNORMAL HIGH (ref 70–99)
Glucose-Capillary: 183 mg/dL — ABNORMAL HIGH (ref 70–99)

## 2023-08-13 MED ORDER — FREE WATER
100.0000 mL | Status: DC
  Administered 2023-08-13 – 2023-08-14 (×4): 100 mL

## 2023-08-13 MED ORDER — LEVOCARNITINE 1 GM/10ML PO SOLN
1000.0000 mg | Freq: Two times a day (BID) | ORAL | Status: DC
  Administered 2023-08-13 – 2023-08-16 (×7): 1000 mg
  Filled 2023-08-13 (×10): qty 10

## 2023-08-13 MED ORDER — FUROSEMIDE 10 MG/ML IJ SOLN
80.0000 mg | Freq: Once | INTRAMUSCULAR | Status: AC
  Administered 2023-08-13: 80 mg via INTRAVENOUS
  Filled 2023-08-13: qty 8

## 2023-08-13 NOTE — Plan of Care (Signed)

## 2023-08-13 NOTE — Consult Note (Signed)
 [facility] Psychiatric Consult Initial  Patient Name: .Sarah Davis  MRN: [id number]  DOB: Aug 29, 1960  Consult Order details:  Orders (From admission, onward)     Start     Ordered   08/12/23 0936  IP CONSULT TO PSYCHIATRY       Ordering Provider: Annella Donnice SAUNDERS, MD  Provider:  (Not yet assigned)  Question Answer Comment  Location [facility]   Reason for Consult? encephalopathy h/o bipolar, medication assistance      08/12/23 0936             Mode of Visit: In person    Psychiatry Consult Evaluation  Service Date: August 13, 2023 LOS:  LOS: 14 days  Chief Complaint: Acute encephalopathy  Primary Psychiatric Diagnoses  Acute encephalopathy  Historical diagnosis of Bipolar II disorder, rule out cluster B traits  Assessment  Sarah Davis is a 63 y.o. female admitted 07/30/2023  5:42 PM for extended presentation of acute encephalopathy following NSTEMI s/p cardiac catheterization 6/12. She carries the psychiatric diagnoses of polysubstance use disorder, bipolar II disorder, schizophrenia and has a past medical history of  HFrEF (45-50%), COPD, hypothyroidism, NSTEMI, STEMI (distal LCX, RCA in 2021) stroke.   Her current presentation of altered mental status is most consistent with acute metabolic encephalopathy. She does not meet criteria for a Bipolar II episode at this time.  Current outpatient psychotropic medications include Paliperidone 3 mg, Buspar 15 mg BID, and lamotrigine  100 mg qhs and historically she has had a moderate response to these medications. It is unknown if she was compliant with medications prior to admission as evidenced. Please see plan below for detailed recommendations.   Diagnoses:  Active Hospital problems: Principal Problem:   NSTEMI (non-ST elevated myocardial infarction) (HCC) Active Problems:   Acute metabolic encephalopathy   Increased ammonia level   Chronic kidney disease (CKD) stage G3b/A2, moderately decreased glomerular  filtration rate (GFR) between 30-44 mL/min/1.73 square meter and albuminuria creatinine ratio between 30-299 mg/g (HCC)   Hypotension due to medication   Acute systolic heart failure (HCC)   AKI (acute kidney injury) (HCC)   Malnutrition of moderate degree    Plan   ## Psychiatric Recommendations:   Patient was unable to be interviewed due to altered mental status. After discussion with Dr. Larina, we believe her acute encephalopathy is primarily metabolic in nature. As far as we can tell, through chart history and per mother, she does not have an active primary psychiatric disorder. She was diagnosed with Bipolar II disorder when she was 64 years old (likely behavioral dysregulation) and has carried this diagnosis ever since. A [facility] Psychiatry visit with Dr. Greig Furlong in 2017 indicates past history of tardive dyskinesia but her history elicited at that time was unconvincing for Bipolar II. At that time, she was recommended to start DBT for frustration tolerance and behavioral difficulties, which are often recommendations patients with Cluster B personality traits. Mother DOES NOT endorse ever seeing patient in a manic episode and denies a history of visual/auditory hallucinations or paranoia. Endorses previous history of depression since sister passed, delirium (seeing pink elephants) while in an acute hospital setting in 2016 for lithium overdose (?suicide attempt) as well as possible worsening parkinsonism (shuffling walk, reduced facial expression, lack of arm swinging) in recent months, either primary or 2/2 longstanding antipsychotic use. Clinically, she does not present with symptoms corresponding to active psychosis or bipolar episode. We do not believe that restarting her home psychiatric medications would benefit this  patient's course of recovery at this time.   -- Continue delirium precautions. -- The initiation of patient's home psychiatric medications is not indicated at this time. Can  restart in outpatient setting as appropriate.  -- Repeated EKG: QTc 484.  -- Psychiatry consult service will sign off at this time.   ## Medical Decision Making Capacity: Not specifically addressed in this encounter  ## Further Work-up:  -- most recent EKG on 6/24 had QtC of 484 -- Pertinent labwork reviewed earlier this admission includes:    ## Disposition:-- There are no psychiatric contraindications to discharge at this time  ## Behavioral / Environmental: - No specific recommendations at this time.     ## Safety and Observation Level:  - Based on my clinical evaluation, I estimate the patient to be at no risk of self harm in the current setting. - At this time, we recommend  routine. This decision is based on my review of the chart including patient's history and current presentation, interview of the patient, mental status examination, and consideration of suicide risk including evaluating suicidal ideation, plan, intent, suicidal or self-harm behaviors, risk factors, and protective factors. This judgment is based on our ability to directly address suicide risk, implement suicide prevention strategies, and develop a safety plan while the patient is in the clinical setting. Please contact our team if there is a concern that risk level has changed.  CSSR Risk Category:C-SSRS RISK CATEGORY: No Risk  Suicide Risk Assessment: Patient has following modifiable risk factors for suicide: unable to assess. Patient has following non-modifiable or demographic risk factors for suicide: history of suicide attempt (questionable) Patient has the following protective factors against suicide: Supportive family  Thank you for this consult request. Recommendations have been communicated to the primary team.  We will sign off at this time.   [name], MD       History of Present Illness  Relevant Aspects of Hospital Course:  Admitted on 07/30/2023 for NSTEMI and underwent cardiac  catheterization.  After this time, patient developed hyperammonemia and altered mental status.  Currently requiring ICU level care on Levophed .  While ammonia is returning to normal, patient remains encephalopathic 13 days into hospital stay.  MRI negative.  Patient Report:   On interview, patient was staring to the left side and unresponsive to verbal commands in interview.  Patient did not track interviewer or respond.  Mother [name] Dines was present in room, more information below  Psych ROS:  Depression: Unable to assess Anxiety:  Unable to assess Mania (lifetime and current): Unable to assess current, per mother no manic episodes throughout lifetime Psychosis: (lifetime and current): Unable to assess current, per mother no psychotic episodes throughout lifetime  Collateral information:  Contacted [name] Dines at 6/24 on 11 AM outside of patient's room:  Per mother, patient's initial bipolar 2 diagnosis is from childhood -- patient used to be hyper and told untruths and acted out.  Does not remember period of time where for 4 more days, patient needed less sleep and had elevated and expansive mood.  Patient lives by herself.  Has been off of her long-acting injectable for 6 months.  If anything, patient has been like a robot.  Notes decrease in hand swing, shuffling steps and decreased facial movement.  For 3 years has been taking paliperidone long-acting injectable but, since stopping 6 months ago, has been spending more money.  Mother otherwise denies noticeable distractibility, irritability, grandiosity, flight of ideas, rapid speech.  Denies history of  delusions and auditory and visual hallucinations indicative of a primary psychotic disorder.  Denies history of suicidal ideation.  Endorses history of delirium when hospitalized in 2016 for lithium overdose, saying that patient saw a pink elephants.  Notes that patient was also confused and could not answer questions appropriately when she  returned from [place] earlier this month AGAINST MEDICAL ADVICE.  Prior to that, patient was essentially at psychiatric/cognitive baseline and could hold conversations, lived by herself.  ROS   Psychiatric and Social History  Psychiatric History:  Information collected from chart review, mother  Prev Dx/Sx: Polysubstance use disorder, historical diagnosis of bipolar 2 disorder diagnosed as a child, major depressive disorder, tardive dyskinesia Current Psych Provider: None current Home Meds (current): Paliperidone 3 mg daily, Lamictal  100 mg nightly, BuSpar 15 mg twice daily Previous Med Trials: Hydroxyzine  50 mg 3 times daily as needed, gabapentin  300 mg nightly, Seroquel  50 mg every morning and 300 mg nightly (patient likes this medication), Risperdal Therapy: None current  Prior Psych Hospitalization: Hospitalized 2016 for lithium overdose, per chart review possible suicide attempt Prior Self Harm: Possible suicide attempt as above Prior Violence: Mother denies  Family Psych History: Son has bipolar disorder, second son has ADHD Family Hx suicide: Not assessed  Social History:  Developmental Hx: Unknown Educational Hx: Not assessed Occupational Hx: Not assessed Legal Hx: None known Living Situation: Unable to assess Access to weapons/lethal means: previously denied, current status unknown  Substance History Alcohol: Previous History of alcohol withdrawal seizures Unknown History of DT's Unknown Tobacco: 0.5 PPD for 30 years Illicit drugs: Previous marijuana use, Klonopin misses Prescription drug abuse: Seroquel  abuse (?) Rehab hx: None known  Exam Findings  Physical Exam:  Vital Signs:  Temp:  [97.8 F (36.6 C)-99 F (37.2 C)] 99 F (37.2 C) (06/25 0737) Pulse Rate:  [69-90] 73 (06/25 0737) Resp:  [14-33] 29 (06/25 0737) BP: (86-126)/(50-73) 99/53 (06/25 0500) SpO2:  [91 %-99 %] 96 % (06/25 0737) Weight:  [57.6 kg] 57.6 kg (06/25 0226) Blood pressure (!) 99/53,  pulse 73, temperature 99 F (37.2 C), temperature source Axillary, resp. rate (!) 29, height 5' 5 (1.651 m), weight 57.6 kg, SpO2 96%. Body mass index is 21.13 kg/m.  Physical Exam Vitals reviewed.  Constitutional:      General: She is not in acute distress.    Appearance: She is ill-appearing and toxic-appearing.  Pulmonary:     Effort: Pulmonary effort is normal. No respiratory distress.   Neurological:     Comments: Unresponsive to verbal commands    Mental Status Exam: General Appearance: Chronically ill-appearing woman, thin-appearing, older than stated age, laying in hospital bed, staring forward, not tracking  Orientation:  UTA  Memory:  UTA  Concentration:  UTA  Recall:  UTA  Attention  UTA  Eye Contact:  Eyes fixed forward   Speech:  UTA  Language:  UTA  Volume:  UTA  Mood: UTA  Affect:  UTA  Thought Process:  UTA  Thought Content:  UTA  Suicidal Thoughts:  UTA  Homicidal Thoughts:  UTA  Judgement:  UTA  Insight:  UTA  Psychomotor Activity:  UTA  Akathisia:  UTA  Fund of Knowledge:  UTA      Assets:  Social Support  Cognition:  UTA  ADL's:  UTA  AIMS (if indicated):        Other History   These have been pulled in through the EMR, reviewed, and updated if appropriate.  Family History:  The patient's  family history includes Diabetes in her father.  Medical History: Past Medical History:  Diagnosis Date   Anxiety    Bipolar 2 disorder (HCC)    Chronic hepatitis C virus genotype 1 infection (HCC)    Chronic pancreatitis (HCC)    CKD (chronic kidney disease) stage 4, GFR 15-29 ml/min (HCC)    COPD (chronic obstructive pulmonary disease) (HCC)    Coronary artery disease 11/2019   s/p PTCI  LCx & RCA. Complicated by Vfib arrest   DM (diabetes mellitus), type 2 (HCC)    HFrEF (heart failure with reduced ejection fraction) (HCC) 08/01/2023   EF 45-50%   Hyperparathyroidism, secondary (HCC)    Hypothyroid    Lithium toxicity    Marijuana abuse     NSTEMI (non-ST elevated myocardial infarction) (HCC) 07/30/2023   med management   Peripheral autonomic neuropathy due to DM [facility])    STEMI (ST elevation myocardial infarction) (HCC) 2021   Stroke [facility])    right hemiparesis   Substance abuse (HCC)    Tardive dyskinesia    Tobacco abuse     Surgical History: Past Surgical History:  Procedure Laterality Date   CARDIAC CATHETERIZATION     CHOLECYSTECTOMY     LEFT HEART CATH AND CORONARY ANGIOGRAPHY N/A 07/31/2023   Patent LCx & RCA stents. Chronic occlussion mLAD with collaterals=med management     Medications:   Current Facility-Administered Medications:    acetaminophen  (TYLENOL ) tablet 650 mg, 650 mg, Per Tube, Q4H PRN, Hunsucker, Donnice SAUNDERS, MD   albuterol  (PROVENTIL ) (2.5 MG/3ML) 0.083% nebulizer solution 2.5 mg, 2.5 mg, Nebulization, Q4H PRN, Minor, Elsie RAMAN, NP   arformoterol  (BROVANA ) nebulizer solution 15 mcg, 15 mcg, Nebulization, BID, Gretta Leita SQUIBB, DO, 15 mcg at 08/13/23 9263   aspirin  chewable tablet 81 mg, 81 mg, Per Tube, Daily, Hunsucker, Donnice SAUNDERS, MD   bethanechol (URECHOLINE) tablet 25 mg, 25 mg, Per Tube, TID, Hunsucker, Donnice SAUNDERS, MD, 25 mg at 08/13/23 0020   Chlorhexidine  Gluconate Cloth 2 % PADS 6 each, 6 each, Topical, Daily, Agarwala, Ravi, MD, 6 each at 08/12/23 0931   docusate (COLACE) 50 MG/5ML liquid 100 mg, 100 mg, Per Tube, BID, Hunsucker, Donnice SAUNDERS, MD, 100 mg at 08/12/23 2121   feeding supplement (GLUCERNA 1.2 CAL) liquid 1,000 mL, 1,000 mL, Per Tube, Continuous, Gretta Leita SQUIBB, DO, Last Rate: 55 mL/hr at 08/13/23 0100, Infusion Verify at 08/13/23 0100   folic acid  (FOLVITE ) tablet 1 mg, 1 mg, Per Tube, Daily, Hunsucker, Donnice SAUNDERS, MD   free water  100 mL, 100 mL, Per Tube, Q1H, Hunsucker, Donnice SAUNDERS, MD, 100 mL at 08/13/23 0801   furosemide  (LASIX ) injection 80 mg, 80 mg, Intravenous, Once, Hunsucker, Donnice SAUNDERS, MD   heparin  injection 5,000 Units, 5,000 Units, Subcutaneous, Q8H, Desai, Nikita S, MD,  5,000 Units at 08/13/23 0451   insulin  aspart (novoLOG ) injection 0-15 Units, 0-15 Units, Subcutaneous, Q4H, Gretta Leita SQUIBB, DO, 3 Units at 08/13/23 0800   insulin  aspart (novoLOG ) injection 5 Units, 5 Units, Subcutaneous, Q4H, Desai, Nikita S, MD, 5 Units at 08/13/23 0800   insulin  glargine-yfgn (SEMGLEE ) injection 15 Units, 15 Units, Subcutaneous, Daily, Paliwal, Aditya, MD, 15 Units at 08/12/23 0915   lactulose  (CHRONULAC ) 10 GM/15ML solution 30 g, 30 g, Per Tube, Daily, Hunsucker, Donnice SAUNDERS, MD   levOCARNitine  (CARNITOR ) 1 GM/10ML solution 1,000 mg, 1,000 mg, Per Tube, BID, Hunsucker, Donnice SAUNDERS, MD   levothyroxine  (SYNTHROID ) tablet 100 mcg, 100 mcg, Per Tube, q morning, Hunsucker, Donnice SAUNDERS,  MD, 100 mcg at 08/13/23 0450   multivitamin with minerals tablet 1 tablet, 1 tablet, Per Tube, BID, Hunsucker, Donnice SAUNDERS, MD, 1 tablet at 08/13/23 0727   [START ON 08/19/2023] multivitamin with minerals tablet 1 tablet, 1 tablet, Per Tube, Daily, Hunsucker, Donnice SAUNDERS, MD   nitroGLYCERIN  (NITROSTAT ) SL tablet 0.4 mg, 0.4 mg, Sublingual, Q5 Min x 3 PRN, Janene, Hao, PA   ondansetron  (ZOFRAN ) injection 4 mg, 4 mg, Intravenous, Q6H PRN, Janene Boer, PA   Oral care mouth rinse, 15 mL, Mouth Rinse, PRN, Agarwala, Ravi, MD   Oral care mouth rinse, 15 mL, Mouth Rinse, PRN, Gretta Leita SQUIBB, DO   Oral care mouth rinse, 15 mL, Mouth Rinse, Q4H, Chand, Sudham, MD, 15 mL at 08/13/23 0726   polyethylene glycol (MIRALAX  / GLYCOLAX ) packet 17 g, 17 g, Per Tube, Daily, Hunsucker, Donnice SAUNDERS, MD   pyridOXINE (VITAMIN B6) tablet 100 mg, 100 mg, Per Tube, Daily, Hunsucker, Donnice SAUNDERS, MD   revefenacin  (YUPELRI ) nebulizer solution 175 mcg, 175 mcg, Nebulization, Daily, Gretta Leita SQUIBB, DO, 175 mcg at 08/13/23 9263   sodium bicarbonate  tablet 1,300 mg, 1,300 mg, Per Tube, BID, Hunsucker, Donnice SAUNDERS, MD, 1,300 mg at 08/12/23 2121   sodium chloride  flush (NS) 0.9 % injection 3 mL, 3 mL, Intravenous, PRN, Patwardhan, Manish J, MD   thiamine   (VITAMIN B1) 500 mg in sodium chloride  0.9 % 50 mL IVPB, 500 mg, Intravenous, Q8H, Hunsucker, Donnice SAUNDERS, MD, Last Rate: 110 mL/hr at 08/13/23 0726, 500 mg at 08/13/23 9273   zinc  sulfate (50mg  elemental zinc ) capsule 220 mg, 220 mg, Per Tube, Daily, Hunsucker, Donnice SAUNDERS, MD  Allergies: Allergies  Allergen Reactions   Lithium Anaphylaxis   Sulfa Antibiotics Rash   Codeine Rash    Iyona Pehrson, MD

## 2023-08-13 NOTE — Progress Notes (Signed)
 NAME:  Sarah Davis, MRN:  [id number], DOB:  Dec 03, 1960, LOS: 14 ADMISSION DATE:  07/30/2023, CONSULTATION DATE: 08/01/2023 REFERRING MD: Cardiology, CHIEF COMPLAINT: Acute respiratory distress  History of Present Illness:  63 year old female who underwent cardiac catheterization 07/31/2023 without acute interventions required.  She does have a history of polysubstance abuse, tobacco abuse, bipolar, thyroid disease and at the time of pulmonary critical care arriving at her bedside she was in acute respiratory failure and required intubation emergently.  She transferred to the intensive care unit.  She is currently on Levophed  drip.  Chest x-ray, ABG, CT of the head have been ordered at this time.  Pertinent  Medical History   Past Medical History:  Diagnosis Date   Anxiety    Bipolar 2 disorder (HCC)    Chronic hepatitis C virus genotype 1 infection (HCC)    Chronic pancreatitis (HCC)    CKD (chronic kidney disease) stage 4, GFR 15-29 ml/min (HCC)    COPD (chronic obstructive pulmonary disease) (HCC)    Coronary artery disease 11/2019   s/p PTCI  LCx & RCA. Complicated by Vfib arrest   DM (diabetes mellitus), type 2 (HCC)    HFrEF (heart failure with reduced ejection fraction) (HCC) 08/01/2023   EF 45-50%   Hyperparathyroidism, secondary (HCC)    Hypothyroid    Lithium toxicity    Marijuana abuse    NSTEMI (non-ST elevated myocardial infarction) (HCC) 07/30/2023   med management   Peripheral autonomic neuropathy due to DM [facility])    STEMI (ST elevation myocardial infarction) (HCC) 2021   Stroke [facility])    right hemiparesis   Substance abuse (HCC)    Tardive dyskinesia    Tobacco abuse    Significant Hospital Events: Including procedures, antibiotic start and stop dates in addition to other pertinent events   6/12 - CT negative for LVO, MRI negative. No seizures on EEG.  6/14 remains somnolent.  6/16 unresponsive, ammonia back in 250 range; started carnitine, increased lactulose  6/17  ammonia back in 70s, still minimally responsive, remaining off sedation 6/21 LFTs elevated 6/23 no improvement in mental status, MRI ok, carnitine stopped as rising LFTs, possible DILI  Interim History / Subjective:  Slowly more verbal  Objective    Blood pressure (!) 99/53, pulse 73, temperature 99 F (37.2 C), temperature source Axillary, resp. rate (!) 29, height 5' 5 (1.651 m), weight 57.6 kg, SpO2 96%.        Intake/Output Summary (Last 24 hours) at 08/13/2023 0943 Last data filed at 08/13/2023 9273 Gross per 24 hour  Intake 1344.97 ml  Output 2685 ml  Net -1340.03 ml   Filed Weights   08/11/23 0324 08/12/23 0500 08/13/23 0226  Weight: 60.1 kg 60.1 kg 57.6 kg    Examination: Chronically ill appearing Opens eyes to voice Withdraws to pain in all 4 extremities +corneal, cough, gag Does not track or follow commands Alert tracks  answers questions but can not hold conversation  Short burst horizontal nystagmus bilaterally  Labs WNL  Vit C WNL Vit D WNL Folate low B12 WNL Carnitine high but lab drawn after started supplementation   Resolved problem list:  Hypokalemia Acute hypoxemic respiratory failure  Assessment and Plan  Acute metabolic encephalopathy, presumed related to hyperammonemia but no immediate improvement despite improved ammonia with carnitine supplementation, slowly improving Hyperammonemia with unclear etiology, no evidence of liver cirrhosis on imaging Possible carnitine deficiency Brain MRI without acute findings 6/4 and on 6/23. 6/13 EEG showed no seizures, same on 6/16  and 6/23 with cEEG CT head was done on 6/13 and 6/19 no acute changes Avoid sedation Presupplementation carnitine level is pending Stop levocarnitine  as below Continue lactulose  for now, goal 500cc liquid stool daily (FMS) Appreciate neurology assistance - discussed case in person with Neurology 6/24 Given no clear organic cause (although suspected metabolic perhaps very  slowly improving), Psychiatry consulted 6/24 to aid in resuming psychiatric meds  Leukocytosis, improved.  No fevers Cultures have been negative Keep off antibiotics  AKI on chronic kidney disease (CKD) stage 3A Hypernatremia (improving) Acute urinary retention Continue  foley Increased free water  6/23 Reduce frequency of NaBicarb 6/23  Volume overload: Lasix  80 mg IV 6/25  Thrombocytopenia likely due to critical illness;   HIT was ruled out - platelets improved over 50 so can resume dvt ppx  Elevate LFTs: Statin induced? - holding statin although this seems less likely as chronic med - stopped levocarnitine  6/23 as has been linked to DILI and is most recent medication addition  CAD with acute NSTEMI (non-ST elevated myocardial infarction) ; LHC demonstrated chronic occlusion of LAD HFrEF> not sure this was all acute- concern for stress induced CM vs chronic ICM Likely demand related based cath results Continue aspirin  and holding statin due to transaminitis GDMT as tolerated - Coreg  stopped 6/23 with low BP  Hypothyroidism Continue Synthroid   Moderate malnutrition Continue dietary supplements   Best Practice (right click and Reselect all SmartList Selections daily)   Diet/type: NPO with tube feeds DVT prophylaxis SCD Pressure ulcer(s): defer to RN assessment GI prophylaxis: H2B Lines: N/A Foley:  N/A Code Status:  full code Last date of multidisciplinary goals of care discussion: 6/22 mother Sarah Davis updated by phone in the morning.    Sarah JONELLE Beals, MD Pulmonary and Critical Care Medicine [facility] 08/13/2023 9:43 AM See AMION  If no response to pager, please call critical care on call (see AMION) until 7pm After 7:00 pm call Elink

## 2023-08-14 DIAGNOSIS — G9341 Metabolic encephalopathy: Secondary | ICD-10-CM | POA: Diagnosis not present

## 2023-08-14 DIAGNOSIS — R7401 Elevation of levels of liver transaminase levels: Secondary | ICD-10-CM | POA: Diagnosis not present

## 2023-08-14 DIAGNOSIS — N1831 Chronic kidney disease, stage 3a: Secondary | ICD-10-CM | POA: Diagnosis not present

## 2023-08-14 DIAGNOSIS — I214 Non-ST elevation (NSTEMI) myocardial infarction: Secondary | ICD-10-CM | POA: Diagnosis not present

## 2023-08-14 LAB — BASIC METABOLIC PANEL WITH GFR
Anion gap: 10 (ref 5–15)
BUN: 31 mg/dL — ABNORMAL HIGH (ref 8–23)
CO2: 29 mmol/L (ref 22–32)
Calcium: 8.2 mg/dL — ABNORMAL LOW (ref 8.9–10.3)
Chloride: 108 mmol/L (ref 98–111)
Creatinine, Ser: 1.32 mg/dL — ABNORMAL HIGH (ref 0.44–1.00)
GFR, Estimated: 45 mL/min — ABNORMAL LOW (ref >60)
Glucose, Bld: 133 mg/dL — ABNORMAL HIGH (ref 70–99)
Potassium: 4 mmol/L (ref 3.5–5.1)
Sodium: 147 mmol/L — ABNORMAL HIGH (ref 135–145)

## 2023-08-14 LAB — MAGNESIUM: Magnesium: 2.1 mg/dL (ref 1.7–2.4)

## 2023-08-14 LAB — CBC
HCT: 23.9 % — ABNORMAL LOW (ref 36.0–46.0)
Hemoglobin: 7.5 g/dL — ABNORMAL LOW (ref 12.0–15.0)
MCH: 27.1 pg (ref 26.0–34.0)
MCHC: 31.4 g/dL (ref 30.0–36.0)
MCV: 86.3 fL (ref 80.0–100.0)
Platelets: 249 10*3/uL (ref 150–400)
RBC: 2.77 MIL/uL — ABNORMAL LOW (ref 3.87–5.11)
RDW: 18.6 % — ABNORMAL HIGH (ref 11.5–15.5)
WBC: 10.7 10*3/uL — ABNORMAL HIGH (ref 4.0–10.5)
nRBC: 0.7 % — ABNORMAL HIGH (ref 0.0–0.2)

## 2023-08-14 LAB — CBC WITH DIFFERENTIAL/PLATELET
Abs Immature Granulocytes: 0.22 10*3/uL — ABNORMAL HIGH (ref 0.00–0.07)
Basophils Absolute: 0 10*3/uL (ref 0.0–0.1)
Basophils Relative: 0 %
Eosinophils Absolute: 0.2 10*3/uL (ref 0.0–0.5)
Eosinophils Relative: 2 %
HCT: 20.5 % — ABNORMAL LOW (ref 36.0–46.0)
Hemoglobin: 6.4 g/dL — CL (ref 12.0–15.0)
Immature Granulocytes: 2 %
Lymphocytes Relative: 12 %
Lymphs Abs: 1.4 10*3/uL (ref 0.7–4.0)
MCH: 27.2 pg (ref 26.0–34.0)
MCHC: 31.2 g/dL (ref 30.0–36.0)
MCV: 87.2 fL (ref 80.0–100.0)
Monocytes Absolute: 0.9 10*3/uL (ref 0.1–1.0)
Monocytes Relative: 9 %
Neutro Abs: 8.3 10*3/uL — ABNORMAL HIGH (ref 1.7–7.7)
Neutrophils Relative %: 75 %
Platelets: 264 10*3/uL (ref 150–400)
RBC: 2.35 MIL/uL — ABNORMAL LOW (ref 3.87–5.11)
RDW: 18.7 % — ABNORMAL HIGH (ref 11.5–15.5)
WBC: 11.1 10*3/uL — ABNORMAL HIGH (ref 4.0–10.5)
nRBC: 0.5 % — ABNORMAL HIGH (ref 0.0–0.2)

## 2023-08-14 LAB — GLUCOSE, CAPILLARY
Glucose-Capillary: 134 mg/dL — ABNORMAL HIGH (ref 70–99)
Glucose-Capillary: 140 mg/dL — ABNORMAL HIGH (ref 70–99)
Glucose-Capillary: 143 mg/dL — ABNORMAL HIGH (ref 70–99)
Glucose-Capillary: 150 mg/dL — ABNORMAL HIGH (ref 70–99)
Glucose-Capillary: 164 mg/dL — ABNORMAL HIGH (ref 70–99)
Glucose-Capillary: 229 mg/dL — ABNORMAL HIGH (ref 70–99)
Glucose-Capillary: 248 mg/dL — ABNORMAL HIGH (ref 70–99)

## 2023-08-14 MED ORDER — FUROSEMIDE 10 MG/ML IJ SOLN
80.0000 mg | Freq: Once | INTRAMUSCULAR | Status: AC
  Administered 2023-08-14: 80 mg via INTRAVENOUS
  Filled 2023-08-14: qty 8

## 2023-08-14 MED ORDER — THIAMINE MONONITRATE 100 MG PO TABS
100.0000 mg | ORAL_TABLET | Freq: Every day | ORAL | Status: DC
  Administered 2023-08-16: 100 mg
  Filled 2023-08-14: qty 1

## 2023-08-14 MED ORDER — FREE WATER
100.0000 mL | Status: DC
  Administered 2023-08-14 – 2023-08-19 (×63): 100 mL

## 2023-08-14 NOTE — Progress Notes (Signed)
 Critical Hgb of 6.4 called by lab. E-link notified. Stat repeat CBC order placed. Awaiting result

## 2023-08-14 NOTE — Progress Notes (Signed)
 Repeat Hgb result 7.5

## 2023-08-14 NOTE — Evaluation (Signed)
 Clinical/Bedside Swallow Evaluation Patient Details  Name: Sarah Davis MRN: [id number] Date of Birth: [birth year]  Today's Date: 08/14/2023 Time: SLP Start Time (ACUTE ONLY): 9083 SLP Stop Time (ACUTE ONLY): 0930 SLP Time Calculation (min) (ACUTE ONLY): 14 min  Past Medical History:  Past Medical History:  Diagnosis Date   Anxiety    Bipolar 2 disorder (HCC)    Chronic hepatitis C virus genotype 1 infection (HCC)    Chronic pancreatitis (HCC)    CKD (chronic kidney disease) stage 4, GFR 15-29 ml/min (HCC)    COPD (chronic obstructive pulmonary disease) (HCC)    Coronary artery disease 11/2019   s/p PTCI  LCx & RCA. Complicated by Vfib arrest   DM (diabetes mellitus), type 2 (HCC)    HFrEF (heart failure with reduced ejection fraction) (HCC) 08/01/2023   EF 45-50%   Hyperparathyroidism, secondary (HCC)    Hypothyroid    Lithium toxicity    Marijuana abuse    NSTEMI (non-ST elevated myocardial infarction) (HCC) 07/30/2023   med management   Peripheral autonomic neuropathy due to DM [facility])    STEMI (ST elevation myocardial infarction) (HCC) 2021   Stroke [facility])    right hemiparesis   Substance abuse (HCC)    Tardive dyskinesia    Tobacco abuse    Past Surgical History:  Past Surgical History:  Procedure Laterality Date   CARDIAC CATHETERIZATION     CHOLECYSTECTOMY     LEFT HEART CATH AND CORONARY ANGIOGRAPHY N/A 07/31/2023   Patent LCx & RCA stents. Chronic occlussion mLAD with collaterals=med management   HPI:  63 year old female admitted from [facility] for cardiac cath 07/31/2023 without acute interventions required. MS changes post-procedure, somnolence and resp failure requiring emergent intubation 6/13-20. Dx acute metabolic encephalopathy, hyperammonemia, AKI, CAD with acute NSTEMI. Has cortrak. PMHx stroke 2021, polysubstance abuse, tobacco abuse, bipolar, thyroid disease.    Assessment / Plan / Recommendation  Clinical Impression  Pt presents with dysphagia  related to mental status, deconditioning.  No spontaneous communication; followed one-step commands intermittently. Accepted ice chips, water , purees with no overt s/s of aspiration. There were multiple sub-swallows per solid boluses, suggesting potential residue or weakness.    Recommend starting sips of water  and ice chips given impaired mental status, sluggish motor response.  Keep NPO otherwise with cortrak. SLP will follow for readiness to advance diet vs instrumental study. D/W RN.  SLP Visit Diagnosis: Dysphagia, unspecified (R13.10)    Aspiration Risk    tba   Diet Recommendation   NPO  Medication Administration: Via alternative means    Other  Recommendations Oral Care Recommendations: Oral care prior to ice chip/H20     Assistance Recommended at Discharge    Functional Status Assessment Patient has had a recent decline in their functional status and demonstrates the ability to make significant improvements in function in a reasonable and predictable amount of time.  Frequency and Duration min 2x/week  2 weeks       Prognosis Prognosis for improved oropharyngeal function: Good      Swallow Study   General Date of Onset: 07/31/23 HPI: 63 year old female admitted from [facility] for cardiac cath 07/31/2023 without acute interventions required. MS changes post-procedure, somnolence and resp failure requiring emergent intubation 6/13-20. Dx acute metabolic encephalopathy, hyperammonemia, AKI, CAD with acute NSTEMI. Has cortrak. PMHx stroke 2021, polysubstance abuse, tobacco abuse, bipolar, thyroid disease. Type of Study: Bedside Swallow Evaluation Previous Swallow Assessment: no Diet Prior to this Study: NPO;Cortrak/Small bore NG tube Temperature Spikes Noted:  No Respiratory Status: Room air History of Recent Intubation: Yes Total duration of intubation (days): 7 days Date extubated: 08/08/23 Behavior/Cognition: Requires cueing Oral Cavity Assessment: Within  Functional Limits Oral Care Completed by SLP: Recent completion by staff Oral Cavity - Dentition: Edentulous Self-Feeding Abilities: Total assist Patient Positioning: Upright in bed Baseline Vocal Quality: Normal Volitional Cough: Cognitively unable to elicit Volitional Swallow: Unable to elicit    Oral/Motor/Sensory Function Overall Oral Motor/Sensory Function: Within functional limits   Ice Chips Ice chips: Within functional limits   Thin Liquid Thin Liquid: Within functional limits    Nectar Thick Nectar Thick Liquid: Not tested   Honey Thick Honey Thick Liquid: Not tested   Puree Puree: Impaired Presentation: Spoon Pharyngeal Phase Impairments: Multiple swallows   Solid     Solid: Not tested      Vona Palma Laurice 08/14/2023,9:49 AM  Palma L. Vona, MA CCC/SLP Clinical Specialist - Acute Care SLP Acute Rehabilitation Services Office number [phone]

## 2023-08-14 NOTE — Progress Notes (Addendum)
 NAME:  Sarah Davis, MRN:  [id number], DOB:  09/23/1960, LOS: 15 ADMISSION DATE:  07/30/2023, CONSULTATION DATE: 08/01/2023 REFERRING MD: Cardiology, CHIEF COMPLAINT: Acute respiratory distress  History of Present Illness:  63 year old female who underwent cardiac catheterization 07/31/2023 without acute interventions required.  She does have a history of polysubstance abuse, tobacco abuse, bipolar, thyroid disease and at the time of pulmonary critical care arriving at her bedside she was in acute respiratory failure and required intubation emergently.  She transferred to the intensive care unit.  She is currently on Levophed  drip.  Chest x-ray, ABG, CT of the head have been ordered at this time.  Pertinent  Medical History   Past Medical History:  Diagnosis Date   Anxiety    Bipolar 2 disorder (HCC)    Chronic hepatitis C virus genotype 1 infection (HCC)    Chronic pancreatitis (HCC)    CKD (chronic kidney disease) stage 4, GFR 15-29 ml/min (HCC)    COPD (chronic obstructive pulmonary disease) (HCC)    Coronary artery disease 11/2019   s/p PTCI  LCx & RCA. Complicated by Vfib arrest   DM (diabetes mellitus), type 2 (HCC)    HFrEF (heart failure with reduced ejection fraction) (HCC) 08/01/2023   EF 45-50%   Hyperparathyroidism, secondary (HCC)    Hypothyroid    Lithium toxicity    Marijuana abuse    NSTEMI (non-ST elevated myocardial infarction) (HCC) 07/30/2023   med management   Peripheral autonomic neuropathy due to DM [facility])    STEMI (ST elevation myocardial infarction) (HCC) 2021   Stroke [facility])    right hemiparesis   Substance abuse (HCC)    Tardive dyskinesia    Tobacco abuse    Significant Hospital Events: Including procedures, antibiotic start and stop dates in addition to other pertinent events   6/12 - CT negative for LVO, MRI negative. No seizures on EEG.  6/14 remains somnolent.  6/16 unresponsive, ammonia back in 250 range; started carnitine, increased lactulose  6/17  ammonia back in 70s, still minimally responsive, remaining off sedation 6/21 LFTs elevated 6/23 no improvement in mental status, MRI ok, carnitine stopped as rising LFTs, possible DILI 6/24 spoke 1-2 words 6/25 speaking in phrases 6/26 held brief conversation  Interim History / Subjective:  Slowly more verbal  Objective    Blood pressure 110/65, pulse 83, temperature 98.2 F (36.8 C), temperature source Oral, resp. rate 20, height 5' 5 (1.651 m), weight 59.5 kg, SpO2 98%.        Intake/Output Summary (Last 24 hours) at 08/14/2023 0931 Last data filed at 08/14/2023 0500 Gross per 24 hour  Intake 795 ml  Output 4285 ml  Net -3490 ml   Filed Weights   08/12/23 0500 08/13/23 0226 08/14/23 0408  Weight: 60.1 kg 57.6 kg 59.5 kg    Examination: Chronically ill appearing Opens eyes to voice Withdraws to pain in all 4 extremities +corneal, cough, gag Does not track or follow commands Alert tracks  answers questions but can not hold conversation  Short burst horizontal nystagmus bilaterally  Labs WNL  Vit C WNL Vit D WNL Folate low B12 WNL Carnitine high but lab drawn after started supplementation   Resolved problem list:  Hypokalemia Acute hypoxemic respiratory failure  Assessment and Plan  Acute metabolic encephalopathy, presumed related to hyperammonemia but no immediate improvement despite improved ammonia with carnitine supplementation, slowly improving Hyperammonemia with unclear etiology, no evidence of liver cirrhosis on imaging Possible carnitine deficiency Brain MRI without acute findings 6/4 and  on 6/23. 6/13 EEG showed no seizures, same on 6/16 and 6/23 with cEEG CT head was done on 6/13 and 6/19 no acute changes Avoid sedation Presupplementation carnitine level is pending Daily or lower dose levocarnitine   Continue lactulose  for now, goal 500cc liquid stool daily (FMS) Appreciate neurology assistance - discussed case in person with Neurology 6/24 Given  no clear organic cause (although suspected metabolic perhaps very slowly improving), Psychiatry consulted 6/24 to aid in resuming psychiatric meds - rec'd not resuming meds SLP OT PT ordered 6/26  Leukocytosis, improved.  No fevers Cultures have been negative Keep off antibiotics  AKI on chronic kidney disease (CKD) stage 3A Hypernatremia (improving) Acute urinary retention Continue  foley Free Water  D/c NaBicarb 6/26  Volume overload: Repeat IV lasix   Thrombocytopenia likely due to critical illness;   HIT was ruled out  Elevate LFTs: Statin induced? - holding statin although this seems less likely as chronic med - stopped high dose carnitine, now resumed normal daily supplementation  CAD with acute NSTEMI (non-ST elevated myocardial infarction) ; LHC demonstrated chronic occlusion of LAD HFrEF> not sure this was all acute- concern for stress induced CM vs chronic ICM Likely demand related based cath results Continue aspirin  and holding statin due to transaminitis GDMT as tolerated - Coreg  stopped 6/23 with low BP  Hypothyroidism Continue Synthroid   Moderate malnutrition Continue dietary supplements   Best Practice (right click and Reselect all SmartList Selections daily)   Diet/type: NPO with tube feeds DVT prophylaxis SCD Pressure ulcer(s): defer to RN assessment GI prophylaxis: H2B Lines: N/A Foley:  N/A Code Status:  full code Last date of multidisciplinary goals of care discussion: 6/22 mother Levorn updated by phone in the morning.    Sarah JONELLE Beals, MD Pulmonary and Critical Care Medicine [facility] 08/14/2023 9:31 AM See AMION  If no response to pager, please call critical care on call (see AMION) until 7pm After 7:00 pm call Elink

## 2023-08-14 NOTE — Plan of Care (Signed)
   Problem: Health Behavior/Discharge Planning: Goal: Ability to manage health-related needs will improve Outcome: Progressing   Problem: Clinical Measurements: Goal: Ability to maintain clinical measurements within normal limits will improve Outcome: Progressing

## 2023-08-15 DIAGNOSIS — D649 Anemia, unspecified: Secondary | ICD-10-CM | POA: Insufficient documentation

## 2023-08-15 DIAGNOSIS — I2585 Chronic coronary microvascular dysfunction: Secondary | ICD-10-CM | POA: Diagnosis not present

## 2023-08-15 DIAGNOSIS — G9341 Metabolic encephalopathy: Secondary | ICD-10-CM | POA: Diagnosis not present

## 2023-08-15 DIAGNOSIS — I214 Non-ST elevation (NSTEMI) myocardial infarction: Secondary | ICD-10-CM | POA: Diagnosis not present

## 2023-08-15 DIAGNOSIS — D696 Thrombocytopenia, unspecified: Secondary | ICD-10-CM

## 2023-08-15 DIAGNOSIS — N1831 Chronic kidney disease, stage 3a: Secondary | ICD-10-CM | POA: Diagnosis not present

## 2023-08-15 DIAGNOSIS — E039 Hypothyroidism, unspecified: Secondary | ICD-10-CM

## 2023-08-15 DIAGNOSIS — I5033 Acute on chronic diastolic (congestive) heart failure: Secondary | ICD-10-CM

## 2023-08-15 DIAGNOSIS — E119 Type 2 diabetes mellitus without complications: Secondary | ICD-10-CM

## 2023-08-15 DIAGNOSIS — I2510 Atherosclerotic heart disease of native coronary artery without angina pectoris: Secondary | ICD-10-CM

## 2023-08-15 LAB — GLUCOSE, CAPILLARY
Glucose-Capillary: 107 mg/dL — ABNORMAL HIGH (ref 70–99)
Glucose-Capillary: 134 mg/dL — ABNORMAL HIGH (ref 70–99)
Glucose-Capillary: 166 mg/dL — ABNORMAL HIGH (ref 70–99)
Glucose-Capillary: 131 mg/dL — ABNORMAL HIGH (ref 70–99)
Glucose-Capillary: 135 mg/dL — ABNORMAL HIGH (ref 70–99)

## 2023-08-15 NOTE — Assessment & Plan Note (Addendum)
 Multifactorial, likely related to hyperammonemia.  She has been on lactulose  with slow improvement but not yet back to baseline.  Liver US  with no focal lesion, with normal parenchymal echogenicity, portal vein is patent on color doppler imaging.  Peaked AST 680 and ALT 336   Possible metabolic defect, carnitine deficiency.   Continue neuro checks per unit protocol Aspiration precautions and nutritional support per cortrack.  Continue lactulose  for now.  Levocarnitine   Carnitine level is pending.  PT and OT

## 2023-08-15 NOTE — Progress Notes (Signed)
 Report called and accepted and awaiting for the bed to be cleaned

## 2023-08-15 NOTE — Assessment & Plan Note (Signed)
 Reactive leukocytosis and chronic anemia Thrombocytopenia has resolved.  Hgb is 7,7  and wbc 10.5  Continue to monitor cell counts

## 2023-08-15 NOTE — Progress Notes (Signed)
 Heart Failure Navigator Progress Note  Assessed for Heart & Vascular TOC clinic readiness.  Patient EF 45-50%, No HF TOC per Dr. Noralee. .   Navigator will sign off at this time.   Stephane Haddock, BSN, Scientist, clinical (histocompatibility and immunogenetics) Only

## 2023-08-15 NOTE — Progress Notes (Signed)
 Progress Note  Patient Name: Sarah Davis Date of Encounter: 08/15/2023  Primary Cardiologist:   None   Subjective   She is awake but not speaking or following direction.   Inpatient Medications    Scheduled Meds:  arformoterol   15 mcg Nebulization BID   aspirin   81 mg Per Tube Daily   bethanechol   25 mg Per Tube TID   Chlorhexidine  Gluconate Cloth  6 each Topical Daily   docusate  100 mg Per Tube BID   folic acid   1 mg Per Tube Daily   free water   100 mL Per Tube Q2H   heparin  injection (subcutaneous)  5,000 Units Subcutaneous Q8H   insulin  aspart  0-15 Units Subcutaneous Q4H   insulin  aspart  5 Units Subcutaneous Q4H   insulin  glargine-yfgn  15 Units Subcutaneous Daily   lactulose   30 g Per Tube Daily   levOCARNitine   1,000 mg Per Tube BID   levothyroxine   100 mcg Per Tube q morning   multivitamin with minerals  1 tablet Per Tube BID   [START ON 08/19/2023] multivitamin with minerals  1 tablet Per Tube Daily   mouth rinse  15 mL Mouth Rinse Q4H   polyethylene glycol  17 g Per Tube Daily   pyridOXINE   100 mg Per Tube Daily   revefenacin   175 mcg Nebulization Daily   [START ON 08/16/2023] thiamine   100 mg Per Tube Daily   zinc  sulfate (50mg  elemental zinc )  220 mg Per Tube Daily   Continuous Infusions:  feeding supplement (GLUCERNA 1.2 CAL) 55 mL/hr at 08/15/23 0600   thiamine  (VITAMIN B1) injection Stopped (08/14/23 2110)   PRN Meds: acetaminophen , albuterol , nitroGLYCERIN , ondansetron  (ZOFRAN ) IV, mouth rinse, mouth rinse, sodium chloride  flush   Vital Signs    Vitals:   08/15/23 0407 08/15/23 0410 08/15/23 0500 08/15/23 0600  BP:  113/67 101/60 111/63  Pulse:  85 86 85  Resp:  (!) 25 17 19   Temp: 99.4 F (37.4 C)     TempSrc: Oral     SpO2:  100% 100% 100%  Weight:   62.5 kg   Height:        Intake/Output Summary (Last 24 hours) at 08/15/2023 0804 Last data filed at 08/15/2023 0600 Gross per 24 hour  Intake 2040 ml  Output 2450 ml  Net -410 ml    Filed Weights   08/13/23 0226 08/14/23 0408 08/15/23 0500  Weight: 57.6 kg 59.5 kg 62.5 kg    Telemetry    NSR - Personally Reviewed  ECG    NA - Personally Reviewed  Physical Exam   GEN: No acute distress.   Neck: No  JVD Cardiac: RRR, no murmurs, rubs, or gallops.  Respiratory: Clear  to auscultation bilaterally. GI: Soft, nontender, non-distended  MS:    Mild diffuse non pitting Neuro:  Nonfocal  Psych:  Unable to assess  Labs    Chemistry Recent Labs  Lab 08/11/23 0812 08/12/23 0924 08/13/23 0824 08/14/23 0234  NA 153* 150* 142 147*  K 4.8 4.1 4.6 4.0  CL 122* 115* 111 108  CO2 25 25 21* 29  GLUCOSE 145* 162* 168* 133*  BUN 34* 35* 31* 31*  CREATININE 1.48* 1.31* 1.28* 1.32*  CALCIUM  7.8* 8.6* 7.6* 8.2*  PROT 3.2* 5.2* 4.3*  --   ALBUMIN <1.5* <1.5* <1.5*  --   AST 224* 180* 160*  --   ALT 206* 234* 192*  --   ALKPHOS 87 150* 160*  --  BILITOT 0.9 0.3 0.4  --   GFRNONAA 40* 46* 47* 45*  ANIONGAP 6 10 10 10      Hematology Recent Labs  Lab 08/10/23 0324 08/14/23 0234 08/14/23 0351  WBC 11.7* 11.1* 10.7*  RBC 3.55* 2.35* 2.77*  HGB 9.4* 6.4* 7.5*  HCT 31.6* 20.5* 23.9*  MCV 89.0 87.2 86.3  MCH 26.5 27.2 27.1  MCHC 29.7* 31.2 31.4  RDW 19.4* 18.7* 18.6*  PLT 94* 264 249    Cardiac EnzymesNo results for input(s): TROPONINI in the last 168 hours. No results for input(s): TROPIPOC in the last 168 hours.   BNPNo results for input(s): BNP, PROBNP in the last 168 hours.   DDimer No results for input(s): DDIMER in the last 168 hours.   Radiology    No results found.  Cardiac Studies   Echo 6/13:    1. Left ventricular ejection fraction, by estimation, is 45 to 50%. The  left ventricle has mildly decreased function. The left ventricle  demonstrates regional wall motion abnormalities (see scoring  diagram/findings for description). There is mild left  ventricular hypertrophy. Left ventricular diastolic parameters are   consistent with Grade I diastolic dysfunction (impaired relaxation).   2. Right ventricular systolic function is normal. The right ventricular  size is normal.   3. A small pericardial effusion is present.   4. The mitral valve is normal in structure. Trivial mitral valve  regurgitation.   5. The aortic valve was not well visualized. Aortic valve regurgitation  is not visualized. Mild aortic valve stenosis. Vmax 2.0 m/s, MG , DI  0.52   Patient Profile     63 y.o. female with CAD, hypertension, tobacco use, hyperlipidemia, schizophrenia/bipolar disorder who was admitted with NSTEMI.   Assessment & Plan    NSTEMI: History of stenting of distal LCx, RCA in 2021 post complicated by V-fib arrest.  Echocardiogram at [facility] showed EF 35 to 40%.  Cath on 6/12 patent LCx and RCA stents, chronic occlusion in mid LAD with collaterals, medical management.   Continues ASA.  Holding off on statin during acute illness with elevated liver enzymes.   Cardiomyopathy:  Getting PRN Lasix  after her significant volume load to manage acute illness.  Unable to titrate meds.  We will follow as needed for discharge med planning.     For questions or updates, please contact [facility] Please consult [email or web address] for contact info under Cardiology/STEMI.   Signed, Lynwood Schilling, MD  08/15/2023, 8:04 AM

## 2023-08-15 NOTE — Assessment & Plan Note (Signed)
Continue nutritional supplements. °

## 2023-08-15 NOTE — Assessment & Plan Note (Addendum)
 Echocardiogram with mild reduction in systolic function 45 to 50%, mild LVH, RV systolic function preserved, small pericardial effusion, mild aortic stenosis   LV hypokinetic apical septal segment, apical anterior segment, apical inferior segment, and apex.   Patient has been diuresed with furosemide  Today clinically euvolemic  Limited medical therapy due to risk of hypotension.

## 2023-08-15 NOTE — Assessment & Plan Note (Signed)
 NSTEMI with chronic occlusion of LAD.  Continue medical therapy with aspirin .

## 2023-08-15 NOTE — TOC Initial Note (Signed)
 Transition of Care [facility]) - Initial/Assessment Note    Patient Details  Name: Sarah Davis MRN: [id number] Date of Birth: 27-Mar-1960  [facility] CM/SW Contact:    Roxie KANDICE Stain, RN Phone Number: 08/15/2023, 7:58 AM  Clinical Narrative:                  Spoke to patient's mother, Sarah Davis, regarding transition needs.  Sarah Davis states that patient lives in [place] alone and Sarah Davis lives in another city can can't help her.  Awaiting PT,OT recommendations.  TOC following.  Expected Discharge Plan: Skilled Nursing Facility Barriers to Discharge: Continued Medical Work up   Patient Goals and CMS Choice            Expected Discharge Plan and Services       Living arrangements for the past 2 months: Apartment                                      Prior Living Arrangements/Services Living arrangements for the past 2 months: Apartment Lives with:: Self Patient language and need for interpreter reviewed:: Yes        Need for Family Participation in Patient Care: Yes (Comment) Care giver support system in place?: No (comment)   Criminal Activity/Legal Involvement Pertinent to Current Situation/Hospitalization: No - Comment as needed  Activities of Daily Living   ADL Screening (condition at time of admission) Independently performs ADLs?: Yes (appropriate for developmental age) Is the patient deaf or have difficulty hearing?: No Does the patient have difficulty seeing, even when wearing glasses/contacts?: No Does the patient have difficulty concentrating, remembering, or making decisions?: No  Permission Sought/Granted                  Emotional Assessment              Admission diagnosis:  NSTEMI (non-ST elevated myocardial infarction) [facility]) [I21.4] Patient Active Problem List   Diagnosis Date Noted   Malnutrition of moderate degree 08/04/2023   AKI (acute kidney injury) (HCC) 08/03/2023   Acute metabolic encephalopathy 08/02/2023    Increased ammonia level 08/02/2023   Chronic kidney disease (CKD) stage G3b/A2, moderately decreased glomerular filtration rate (GFR) between 30-44 mL/min/1.73 square meter and albuminuria creatinine ratio between 30-299 mg/g (HCC) 08/02/2023   Hypotension due to medication 08/02/2023   Acute systolic heart failure (HCC) 08/02/2023   NSTEMI (non-ST elevated myocardial infarction) (HCC) 07/30/2023   Depression, major, recurrent, moderate (HCC) 09/06/2018   Polysubstance (excluding opioids) dependence, daily use (HCC) 06/20/2017   Chronic hepatitis C without hepatic coma (HCC) 02/28/2016   Bipolar disorder (HCC) 02/28/2016   Dyslipidemia 02/28/2016   PCP:  Patient, No Pcp Per Pharmacy:   DARRYLE LONG - [facility] [address] [place] [address] Phone: [phone] Fax: [phone]  [id number] - [place], [place] - [address] AT [facility] [address] [place] [address] Phone: [phone] Fax: [phone]     Social Drivers of Health (SDOH) Social History: SDOH Screenings   Food Insecurity: Patient Unable To Answer (08/01/2023)  Housing: Unknown (08/01/2023)  Transportation Needs: Patient Unable To Answer (08/01/2023)  Utilities: Patient Unable To Answer (08/01/2023)  Tobacco Use: High Risk (08/04/2023)   SDOH Interventions:     Readmission Risk Interventions     No data to display

## 2023-08-15 NOTE — Hospital Course (Signed)
 Sarah Davis was transferred from [facility] to [facility] for cardiac catheterization.   63 year old female with past medical history of bipolar, CKD, COPD, coronary artery disease, history of CVA, neuropathy and hypothyroidism, who was admitted to [facility] 07/28/23 with the working diagnosis of acute metabolic encephalopathy due to hypoglycemia. Apparently she had decreased po intake while still using insulin .  Treated with D10 drip.  She was found to have NSTEMI, prompting her transfer.   Underwent cardiac catheterization 07/31/2023 without acute interventions required. Post procedure she had worsening mentation, and  acute respiratory failure. Required intubation emergently. She transferred to the intensive care unit.  6/12 - CT negative for LVO, MRI negative. No seizures on EEG.  6/14 remains somnolent.  6/16 unresponsive, ammonia back in 250 range; started carnitine, increased lactulose  6/17 ammonia back in 70s, still minimally responsive, remaining off sedation 6/21 LFTs elevated 6/23 no improvement in mental status, MRI ok, carnitine stopped as rising LFTs, possible DILI 6/24 spoke 1-2 words 6/25 speaking in phrases 6/26 held brief conversation  06/28 slowly improving mentation, tolerating well tube feedings.  06/29 mentation continue to improve, per her family at the bedside she is not yet back to her baseline.  06/30 advanced diet, with improvement in swallow dysfunction.  07/01 Cortrak removed.

## 2023-08-15 NOTE — Assessment & Plan Note (Signed)
 Continue glucose cover and monitoring with insulin  sliding scale.  Qid short acting  Basal 15 units.  Glucose this am is 161 mg/ dl

## 2023-08-15 NOTE — Evaluation (Signed)
 Occupational Therapy Evaluation Patient Details Name: Sarah Davis MRN: [id number] DOB: [birth year] Today's Date: 08/15/2023   History of Present Illness   63 yo female admitted 07/30/23. 6/12 cardiac cath with somnolence and respiratory failure post procedure requiring intubation 6/13-6/20. NSTEMI, encephalopathy and AKI impacting course. PMhx: polysubstance abuse, bipolar disorder, thyroid disease, hep C, COPD, DM, HFrEF, CVA with Rt hemiparesis     Clinical Impressions PTA, pt lives alone, typically Modified Independent with ADLs/mobility using RW, has intermittent assist for IADLs. Pt presents now with deficits in cognition, sitting balance, strength and endurance. Pt with inconsistent command following of one step commands, moving R UE more than LUE today. Pt requires Total A for all ADLs, Total A for bed mobility and maintaining sitting balance w/ bed egress function. Based on current deficits and presenting below functional baseline, recommend continued inpatient follow up therapy, <3 hours/day at DC.   SpO2 95% on RA, RR briefly 33     If plan is discharge home, recommend the following:   A lot of help with walking and/or transfers;Two people to help with walking and/or transfers;A lot of help with bathing/dressing/bathroom;Two people to help with bathing/dressing/bathroom     Functional Status Assessment   Patient has had a recent decline in their functional status and demonstrates the ability to make significant improvements in function in a reasonable and predictable amount of time.     Equipment Recommendations   Other (comment) (TBD)     Recommendations for Other Services         Precautions/Restrictions   Precautions Precautions: Fall;Other (comment) Recall of Precautions/Restrictions: Impaired Precaution/Restrictions Comments: cortrak, flexiseal Restrictions Weight Bearing Restrictions Per Provider Order: No     Mobility Bed Mobility Overal bed  mobility: Needs Assistance             General bed mobility comments: foot egress positioning with total assist to scoot hips forward and max assist for anterior translation off surface and to maintain sitting x 3 trials. Total +2 to slide to [facility]    Transfers                   General transfer comment: deferred      Balance Overall balance assessment: Needs assistance Sitting-balance support: Feet supported, Bilateral upper extremity supported, No upper extremity supported Sitting balance-Leahy Scale: Zero                                     ADL either performed or assessed with clinical judgement   ADL Overall ADL's : Needs assistance/impaired Eating/Feeding: NPO   Grooming: Total assistance   Upper Body Bathing: Total assistance   Lower Body Bathing: Total assistance   Upper Body Dressing : Total assistance   Lower Body Dressing: Total assistance       Toileting- Clothing Manipulation and Hygiene: Total assistance;Bed level         General ADL Comments: overall Total A due to impaired cognition (command following) and weakness     Vision Ability to See in Adequate Light: 0 Adequate Patient Visual Report: No change from baseline Vision Assessment?: No apparent visual deficits     Perception         Praxis         Pertinent Vitals/Pain Pain Assessment Pain Assessment: Faces Faces Pain Scale: No hurt Pain Intervention(s): Monitored during session     Extremity/Trunk Assessment Upper Extremity Assessment Upper Extremity  Assessment: Difficult to assess due to impaired cognition;RUE deficits/detail;LUE deficits/detail RUE Deficits / Details: hx of CVA affecting this UE per daughter but moving this UE more than LUE during session today. able to grasp, lift this UE multiple times LUE Deficits / Details: able to grasp with this UE, less functional reaching noted   Lower Extremity Assessment Lower Extremity Assessment: Defer to  PT evaluation RLE Deficits / Details: pt able to wiggle toes bil, PROM WFL no active quad or hamstring activation despite cueing and assist LLE Deficits / Details: pt able to wiggle toes bil, PROM WFL no active quad or hamstring activation despite cueing and assist   Cervical / Trunk Assessment Cervical / Trunk Assessment: Other exceptions Cervical / Trunk Exceptions: poor trunk control requiring max assist   Communication Communication Communication: Impaired Factors Affecting Communication: Difficulty expressing self   Cognition Arousal: Alert Behavior During Therapy: Flat affect Cognition: Difficult to assess Difficult to assess due to: Impaired communication           OT - Cognition Comments: able to track appropriately to staff talking, inconsistent command following. often nodding head yes to questions but no verbalization. slower processing                 Following commands: Impaired Following commands impaired: Follows one step commands inconsistently, Follows one step commands with increased time     Cueing  General Comments   Cueing Techniques: Verbal cues;Gestural cues;Tactile cues      Exercises     Shoulder Instructions      Home Living Family/patient expects to be discharged to:: Private residence Living Arrangements: Alone Available Help at Discharge: Family;Available 24 hours/day Type of Home: Apartment Home Access: Elevator     Home Layout: One level     Bathroom Shower/Tub: Chief Strategy Officer: Standard     Home Equipment: Agricultural consultant (2 wheels);Other (comment) (knee scooter)   Additional Comments: lives in 4th floor apartment. can go to daughter's house which is single story with 6 steps to enter      Prior Functioning/Environment Prior Level of Function : Needs assist             Mobility Comments: walking with RW, limited distance, doesn't drive ADLs Comments: family and friends doing grocery shopping  and cleaning, able to manage ADLs though difficult at times    OT Problem List: Decreased strength;Decreased activity tolerance;Impaired balance (sitting and/or standing);Decreased cognition;Decreased safety awareness;Decreased coordination;Decreased knowledge of use of DME or AE;Impaired UE functional use   OT Treatment/Interventions: Self-care/ADL training;Therapeutic exercise;Energy conservation;DME and/or AE instruction;Therapeutic activities;Patient/family education;Balance training      OT Goals(Current goals can be found in the care plan section)   Acute Rehab OT Goals Patient Stated Goal: hopeful for improvements to be able to go home with family eventually OT Goal Formulation: With family Time For Goal Achievement: 08/29/23 Potential to Achieve Goals: Fair ADL Goals Pt Will Perform Grooming: with mod assist;bed level Pt Will Perform Upper Body Bathing: with mod assist;sitting Pt/caregiver will Perform Home Exercise Program: Increased strength;Both right and left upper extremity;With minimal assist;With written HEP provided Additional ADL Goal #1: Pt to follow one step commands > 75% of the time during functional tasks Additional ADL Goal #2: Pt to maintain sitting balance unsupported with no more than Min A during functional tasks   OT Frequency:  Min 2X/week    Co-evaluation PT/OT/SLP Co-Evaluation/Treatment: Yes Reason for Co-Treatment: Necessary to address cognition/behavior during functional activity PT goals  addressed during session: Mobility/safety with mobility;Balance;Strengthening/ROM OT goals addressed during session: ADL's and self-care;Strengthening/ROM      AM-PAC OT 6 Clicks Daily Activity     Outcome Measure Help from another person eating meals?: Total Help from another person taking care of personal grooming?: Total Help from another person toileting, which includes using toliet, bedpan, or urinal?: Total Help from another person bathing (including  washing, rinsing, drying)?: Total Help from another person to put on and taking off regular upper body clothing?: Total Help from another person to put on and taking off regular lower body clothing?: Total 6 Click Score: 6   End of Session Nurse Communication: Mobility status  Activity Tolerance: Patient tolerated treatment well Patient left: in bed;with call bell/phone within reach  OT Visit Diagnosis: Other abnormalities of gait and mobility (R26.89);Muscle weakness (generalized) (M62.81);Other symptoms and signs involving cognitive function                Time: 9092-9073 OT Time Calculation (min): 19 min Charges:  OT General Charges $OT Visit: 1 Visit OT Evaluation $OT Eval Moderate Complexity: 1 Mod  Mliss NOVAK, OTR/L Acute Rehab Services Office: [phone]   Mliss Fish 08/15/2023, 11:07 AM

## 2023-08-15 NOTE — Evaluation (Signed)
 Physical Therapy Evaluation Patient Details Name: Sarah Davis MRN: [id number] DOB: 02-09-1961 Today's Date: 08/15/2023  History of Present Illness  63 yo female admitted 07/30/23. 6/12 cardiac cath with somnolence and respiratory failure post procedure requiring intubation 6/13-6/20. NSTEMI, encephalopathy and AKI impacting course. PMhx: polysubstance abuse, bipolar disorder, thyroid disease, hep C, COPD, DM, HFrEF, CVA with Rt hemiparesis  Clinical Impression  Pt with flat affect, alert and limited response via head nods/shakes. Pt with limited movement of RUE, slight grip on Left and able to wiggle toes bil. Pt tolerating transitioning to sitting EOB and PROM of extremities. Pt with significant decline in function as was mod I and living alone PTA. Pt with decreased cognition, strength, function, balance and mobility who will benefit from acute therapy to maximize mobility, safety and function to decrease burden of care. Patient will benefit from continued inpatient follow up therapy, <3 hours/day  95% on RA HR 93         If plan is discharge home, recommend the following: Two people to help with walking and/or transfers;Two people to help with bathing/dressing/bathroom;Direct supervision/assist for medications management;Assistance with feeding;Assistance with cooking/housework;Direct supervision/assist for financial management;Assist for transportation;Supervision due to cognitive status   Can travel by private vehicle   No    Equipment Recommendations Wheelchair (measurements PT);Hospital bed;Wheelchair cushion (measurements PT)  Recommendations for Other Services       Functional Status Assessment Patient has had a recent decline in their functional status and/or demonstrates limited ability to make significant improvements in function in a reasonable and predictable amount of time     Precautions / Restrictions Precautions Precautions: Fall;Other (comment) Recall of  Precautions/Restrictions: Impaired Precaution/Restrictions Comments: cortrak, flexiseal      Mobility  Bed Mobility Overal bed mobility: Needs Assistance Bed Mobility: Supine to Sit, Sit to Supine     Supine to sit: Max assist     General bed mobility comments: foot egress positioning with total assist to scoot hips forward and max assist for anterior translation off surface and to maintain sitting x 3 trials. Total +2 to slide to [facility]    Transfers                   General transfer comment: unable    Ambulation/Gait                  Stairs            Wheelchair Mobility     Tilt Bed    Modified Rankin (Stroke Patients Only)       Balance Overall balance assessment: Needs assistance   Sitting balance-Leahy Scale: Zero                                       Pertinent Vitals/Pain Pain Assessment Pain Assessment: CPOT Facial Expression: Relaxed, neutral Body Movements: Absence of movements Muscle Tension: Relaxed Compliance with ventilator (intubated pts.): N/A Vocalization (extubated pts.): Talking in normal tone or no sound CPOT Total: 0    Home Living Family/patient expects to be discharged to:: Private residence Living Arrangements: Alone Available Help at Discharge: Family;Available 24 hours/day Type of Home: Apartment Home Access: Elevator       Home Layout: One level Home Equipment: Agricultural consultant (2 wheels);Other (comment) (knee scooter) Additional Comments: lives in 4th floor apartment. can go to daughter's house which is single story with 6 steps to enter  Prior Function Prior Level of Function : Needs assist             Mobility Comments: walking with RW, limited distance, doesn't drive ADLs Comments: family and friends doing grocery shopping and cleaning     Extremity/Trunk Assessment   Upper Extremity Assessment Upper Extremity Assessment: Defer to OT evaluation    Lower Extremity  Assessment Lower Extremity Assessment: RLE deficits/detail;LLE deficits/detail RLE Deficits / Details: pt able to wiggle toes bil, PROM WFL no active quad or hamstring activation despite cueing and assist LLE Deficits / Details: pt able to wiggle toes bil, PROM WFL no active quad or hamstring activation despite cueing and assist    Cervical / Trunk Assessment Cervical / Trunk Assessment: Other exceptions Cervical / Trunk Exceptions: poor trunk control requiring max assist  Communication   Communication Communication: Impaired Factors Affecting Communication: Difficulty expressing self    Cognition Arousal: Alert Behavior During Therapy: Flat affect   PT - Cognitive impairments: Difficult to assess Difficult to assess due to: Impaired communication                     PT - Cognition Comments: pt non verbal throughout session but would nod/shake head appropriately at times. delay to commands and only moving RUE on command after increased time         Cueing Cueing Techniques: Verbal cues, Gestural cues, Tactile cues     General Comments      Exercises General Exercises - Lower Extremity Short Arc Quad: PROM, Both, 10 reps, Seated Hip Flexion/Marching: PROM, Both, 10 reps, Seated   Assessment/Plan    PT Assessment Patient needs continued PT services  PT Problem List Decreased strength;Decreased coordination;Decreased activity tolerance;Decreased balance;Decreased mobility;Decreased safety awareness;Decreased knowledge of use of DME;Decreased cognition       PT Treatment Interventions DME instruction;Balance training;Neuromuscular re-education;Functional mobility training;Patient/family education;Cognitive remediation;Therapeutic activities;Therapeutic exercise;Wheelchair mobility training    PT Goals (Current goals can be found in the Care Plan section)  Acute Rehab PT Goals PT Goal Formulation: Patient unable to participate in goal setting Time For Goal  Achievement: 08/29/23 Potential to Achieve Goals: Fair    Frequency Min 2X/week     Co-evaluation PT/OT/SLP Co-Evaluation/Treatment: Yes Reason for Co-Treatment: Necessary to address cognition/behavior during functional activity PT goals addressed during session: Mobility/safety with mobility;Balance;Strengthening/ROM         AM-PAC PT 6 Clicks Mobility  Outcome Measure Help needed turning from your back to your side while in a flat bed without using bedrails?: Total Help needed moving from lying on your back to sitting on the side of a flat bed without using bedrails?: Total Help needed moving to and from a bed to a chair (including a wheelchair)?: Total Help needed standing up from a chair using your arms (e.g., wheelchair or bedside chair)?: Total Help needed to walk in hospital room?: Total Help needed climbing 3-5 steps with a railing? : Total 6 Click Score: 6    End of Session   Activity Tolerance: Patient tolerated treatment well Patient left: in bed;with call bell/phone within reach;with bed alarm set Nurse Communication: Mobility status;Need for lift equipment PT Visit Diagnosis: Other abnormalities of gait and mobility (R26.89);Difficulty in walking, not elsewhere classified (R26.2);Other symptoms and signs involving the nervous system (R29.898)    Time: 9098-9073 PT Time Calculation (min) (ACUTE ONLY): 25 min   Charges:   PT Evaluation $PT Eval Moderate Complexity: 1 Mod   PT General Charges $$ ACUTE PT VISIT:  1 Visit         Lenoard SQUIBB, PT Acute Rehabilitation Services Office: [phone]   Lenoard KATHEE Docker 08/15/2023, 9:40 AM

## 2023-08-15 NOTE — Plan of Care (Signed)
  Problem: Clinical Measurements: Goal: Diagnostic test results will improve Outcome: Progressing Goal: Respiratory complications will improve Outcome: Progressing Goal: Cardiovascular complication will be avoided Outcome: Progressing   Problem: Nutrition: Goal: Adequate nutrition will be maintained Outcome: Progressing   Problem: Coping: Goal: Level of anxiety will decrease Outcome: Progressing   Problem: Elimination: Goal: Will not experience complications related to bowel motility Outcome: Progressing Goal: Will not experience complications related to urinary retention Outcome: Progressing   Problem: Pain Managment: Goal: General experience of comfort will improve and/or be controlled Outcome: Progressing   Problem: Safety: Goal: Ability to remain free from injury will improve Outcome: Progressing   Problem: Cardiac: Goal: Ability to achieve and maintain adequate cardiovascular perfusion will improve Outcome: Progressing   Problem: Fluid Volume: Goal: Ability to maintain a balanced intake and output will improve Outcome: Progressing   Problem: Metabolic: Goal: Ability to maintain appropriate glucose levels will improve Outcome: Progressing   Problem: Tissue Perfusion: Goal: Adequacy of tissue perfusion will improve Outcome: Progressing

## 2023-08-15 NOTE — TOC Progression Note (Addendum)
 Transition of Care [facility]) - Progression Note    Patient Details  Name: Sarah Davis MRN: [id number] Date of Birth: 1961-02-17  Transition of Care [facility]) CM/SW Contact  Luise JAYSON Pan, [place] Phone Number: 08/15/2023, 2:49 PM  Clinical Narrative:   CSW reviewed PT recs for SNF STR. Patient has [employer], this will be a barrier as no one facilities accept [employer].   2:57 PM CSW contacted [employer] to confirm patients insurance and inquire about if she has a Sports coach. Per representative, patient opted out of tailored plan services. References number for today's call [phone].  TOC will continue to follow.    Expected Discharge Plan: Skilled Nursing Facility Barriers to Discharge: Continued Medical Work up  Expected Discharge Plan and Services       Living arrangements for the past 2 months: Apartment                                       Social Determinants of Health (SDOH) Interventions SDOH Screenings   Food Insecurity: Patient Unable To Answer (08/01/2023)  Housing: Unknown (08/01/2023)  Transportation Needs: Patient Unable To Answer (08/01/2023)  Utilities: Patient Unable To Answer (08/01/2023)  Tobacco Use: High Risk (08/04/2023)    Readmission Risk Interventions     No data to display

## 2023-08-15 NOTE — Progress Notes (Signed)
 Speech Language Pathology Treatment: Dysphagia  Patient Details Name: Sarah Davis MRN: [id number] DOB: [birth year] Today's Date: 08/15/2023 Time: 8959-8947 SLP Time Calculation (min) (ACUTE ONLY): 12 min  Assessment / Plan / Recommendation Clinical Impression  Pt's swallowing performance is similar to that which was observed during yesterday's assessment.  She continues to require multiple sub-swallows with each single bolus.  Motor response appears to be delayed. Pt with no spontaneous voicing, did not follow commands - looked at examiner and engaged in automatic activity of swallowing, but was otherwise not interactive; inert.    Recommend continuing sips of water  and ice chips; otherwise keep NPO.  May benefit from MBS to determine physiology of swallow given cognitive deficits. Will follow.   HPI HPI: 63 year old female admitted from [facility] for cardiac cath 07/31/2023 without acute interventions required. MS changes post-procedure, somnolence and resp failure requiring emergent intubation 6/13-20. Dx acute metabolic encephalopathy, hyperammonemia, AKI, CAD with acute NSTEMI. Has cortrak. PMHx stroke 2021, polysubstance abuse, tobacco abuse, bipolar, thyroid disease.      SLP Plan  Continue with current plan of care          Recommendations  Diet recommendations: Other(comment) (sips and chips) Liquids provided via: Straw Medication Administration: Via alternative means Postural Changes and/or Swallow Maneuvers: Seated upright 90 degrees                  Oral care prior to ice chip/H20     Dysphagia, unspecified (R13.10)     Continue with current plan of care   Lirio Bach L. V[place], MA CCC/SLP Clinical Specialist - Acute Care SLP Acute Rehabilitation Services Office number [phone]   Vona Palma Laurice  08/15/2023, 10:52 AM

## 2023-08-15 NOTE — Assessment & Plan Note (Signed)
 AKI, hypernatremia,, hypokalemia.   Stable renal function and electrolytes with serum cr at 1,34 with K at 4,4 and serum bicarbonate at 28  Na 142   Follow up BMP in 48 hrs

## 2023-08-15 NOTE — Progress Notes (Addendum)
 Progress Note   Patient: Sarah Davis FMW:[id number] DOB: [birth year] DOA: 07/30/2023     16 DOS: the patient was seen and examined on 08/15/2023   Brief hospital course: 63 year old female who underwent cardiac catheterization 07/31/2023 without acute interventions required. She does have a history of polysubstance abuse, tobacco abuse, bipolar, thyroid disease and at the time of pulmonary critical care arriving at her bedside she was in acute respiratory failure and required intubation emergently. She transferred to the intensive care unit.  6/12 - CT negative for LVO, MRI negative. No seizures on EEG.  6/14 remains somnolent.  6/16 unresponsive, ammonia back in 250 range; started carnitine, increased lactulose  6/17 ammonia back in 70s, still minimally responsive, remaining off sedation 6/21 LFTs elevated 6/23 no improvement in mental status, MRI ok, carnitine stopped as rising LFTs, possible DILI 6/24 spoke 1-2 words 6/25 speaking in phrases 6/26 held brief conversation  Assessment and Plan: * Acute metabolic encephalopathy Multifactorial, likely related to hyperammonemia.  She has been on lactulose  with slow improvement but not yet back to baseline.  Liver US  with no focal lesion, with normal parenchymal echogenicity, portal vein is patent on color doppler imaging.  Peaked AST 680 and ALT 336   Possible metabolic defect, carnitine deficiency.   Continue neuro checks per unit protocol Aspiration precautions and nutritional support per cortrack.  Continue lactulose  for now.  Levocarnitine   Carnitine level is pending.  PT and OT   Acute on chronic diastolic CHF (congestive heart failure) (HCC) Echocardiogram with mild reduction in systolic function 45 to 50%, mild LVH, RV systolic function preserved, small pericardial effusion, mild aortic stenosis   LV hypokinetic apical septal segment, apical anterior segment, apical inferior segment, and apex.   Patient has been diuresed with  furosemide  Today clinically euvolemic  Limited medical therapy due to risk of hypotension.   Coronary artery disease NSTEMI with chronic occlusion of LAD.  Continue medical therapy with aspirin .   CKD stage 3a, GFR 45-59 ml/min (HCC) AKI, hypernatremia,, hypokalemia.   Renal function with serum cr at 1,32 with K at 4,0 and serum bicarbonate at 29  Na 147 and Mg 2,1   Plan to continue close follow up renal function and electrolytes.   Malnutrition of moderate degree Continue nutritional supplements   Hypothyroidism Continue levothyroxine    Thrombocytopenia (HCC) Reactive leukocytosis and chronic anemia Thrombocytopenia has resolved.  Hgb is 7,5 and wbc 10.7  Continue to monitor cell counts   Type 2 diabetes mellitus (HCC) Continue glucose cover and monitoring with insulin  sliding scale.  Qid short acting  Basal 15 units.    Subjective: patient has slow responds to questions, moves head side to side  when asking if pain or dyspnea, she has cortrack in place   Physical Exam: Vitals:   08/15/23 0934 08/15/23 1000 08/15/23 1100 08/15/23 1200  BP:  125/80 115/69 (!) 105/55  Pulse: 93 99 100 (!) 107  Resp:  (!) 25 (!) 31 (!) 30  Temp:      TempSrc:      SpO2: 95% 100% 100% 98%  Weight:      Height:       Neurology eyes open, follows simple commands and has minimal verbal response.  Mild asterixis  ENT with mild pallor. Cortrack in place no icterus  Cardiovascular with S1 and S2 present and regular with no gallops, rubs or murmurs Respiratory with no rales or wheezing, no rhonchi, no wheezing, poor inspiratory effort Abdomen with no distention. No lower extremity edema.  Protective boots in place  Data Reviewed:    Family Communication: no family at the bedside   Disposition: Status is: Inpatient Remains inpatient appropriate because: recovering encephalopathy   Planned Discharge Destination: Skilled nursing facility  Author: Elidia Toribio Furnace,  MD 08/15/2023 1:01 PM  For on call review [email or web address].

## 2023-08-15 NOTE — Assessment & Plan Note (Signed)
 Continue levothyroxine 

## 2023-08-16 DIAGNOSIS — N1831 Chronic kidney disease, stage 3a: Secondary | ICD-10-CM | POA: Diagnosis not present

## 2023-08-16 DIAGNOSIS — I5033 Acute on chronic diastolic (congestive) heart failure: Secondary | ICD-10-CM | POA: Diagnosis not present

## 2023-08-16 DIAGNOSIS — I2585 Chronic coronary microvascular dysfunction: Secondary | ICD-10-CM | POA: Diagnosis not present

## 2023-08-16 DIAGNOSIS — I5022 Chronic systolic (congestive) heart failure: Secondary | ICD-10-CM | POA: Diagnosis not present

## 2023-08-16 DIAGNOSIS — D649 Anemia, unspecified: Secondary | ICD-10-CM

## 2023-08-16 DIAGNOSIS — G9341 Metabolic encephalopathy: Secondary | ICD-10-CM | POA: Diagnosis not present

## 2023-08-16 DIAGNOSIS — I214 Non-ST elevation (NSTEMI) myocardial infarction: Secondary | ICD-10-CM | POA: Diagnosis not present

## 2023-08-16 LAB — CBC
HCT: 25.2 % — ABNORMAL LOW (ref 36.0–46.0)
Hemoglobin: 7.7 g/dL — ABNORMAL LOW (ref 12.0–15.0)
MCH: 26.6 pg (ref 26.0–34.0)
MCHC: 30.6 g/dL (ref 30.0–36.0)
MCV: 86.9 fL (ref 80.0–100.0)
Platelets: 321 10*3/uL (ref 150–400)
RBC: 2.9 MIL/uL — ABNORMAL LOW (ref 3.87–5.11)
RDW: 18.9 % — ABNORMAL HIGH (ref 11.5–15.5)
WBC: 10.5 10*3/uL (ref 4.0–10.5)
nRBC: 0.7 % — ABNORMAL HIGH (ref 0.0–0.2)

## 2023-08-16 LAB — COMPREHENSIVE METABOLIC PANEL WITH GFR
ALT: 115 U/L — ABNORMAL HIGH (ref 0–44)
AST: 101 U/L — ABNORMAL HIGH (ref 15–41)
Albumin: <1.5 g/dL — ABNORMAL LOW (ref 3.5–5.0)
Alkaline Phosphatase: 191 U/L — ABNORMAL HIGH (ref 38–126)
Anion gap: 10 (ref 5–15)
BUN: 31 mg/dL — ABNORMAL HIGH (ref 8–23)
CO2: 28 mmol/L (ref 22–32)
Calcium: 8.3 mg/dL — ABNORMAL LOW (ref 8.9–10.3)
Chloride: 104 mmol/L (ref 98–111)
Creatinine, Ser: 1.34 mg/dL — ABNORMAL HIGH (ref 0.44–1.00)
GFR, Estimated: 45 mL/min — ABNORMAL LOW (ref >60)
Glucose, Bld: 161 mg/dL — ABNORMAL HIGH (ref 70–99)
Potassium: 4.4 mmol/L (ref 3.5–5.1)
Sodium: 142 mmol/L (ref 135–145)
Total Bilirubin: 0.3 mg/dL (ref 0.0–1.2)
Total Protein: 5.7 g/dL — ABNORMAL LOW (ref 6.5–8.1)

## 2023-08-16 LAB — GLUCOSE, CAPILLARY
Glucose-Capillary: 107 mg/dL — ABNORMAL HIGH (ref 70–99)
Glucose-Capillary: 150 mg/dL — ABNORMAL HIGH (ref 70–99)
Glucose-Capillary: 158 mg/dL — ABNORMAL HIGH (ref 70–99)
Glucose-Capillary: 163 mg/dL — ABNORMAL HIGH (ref 70–99)
Glucose-Capillary: 165 mg/dL — ABNORMAL HIGH (ref 70–99)
Glucose-Capillary: 165 mg/dL — ABNORMAL HIGH (ref 70–99)

## 2023-08-16 LAB — AMMONIA: Ammonia: 24 umol/L (ref 9–35)

## 2023-08-16 MED ORDER — FOLIC ACID 1 MG PO TABS
1.0000 mg | ORAL_TABLET | Freq: Every day | ORAL | Status: DC
  Administered 2023-08-17 – 2023-09-01 (×16): 1 mg via ORAL
  Filled 2023-08-16 (×16): qty 1

## 2023-08-16 MED ORDER — ACETAMINOPHEN 325 MG PO TABS
650.0000 mg | ORAL_TABLET | ORAL | Status: DC | PRN
  Administered 2023-08-22 – 2023-08-25 (×2): 650 mg via ORAL
  Filled 2023-08-16 (×2): qty 2

## 2023-08-16 MED ORDER — VITAMIN B-6 100 MG PO TABS
100.0000 mg | ORAL_TABLET | Freq: Every day | ORAL | Status: AC
  Administered 2023-08-17 – 2023-08-24 (×8): 100 mg via ORAL
  Filled 2023-08-16 (×8): qty 1

## 2023-08-16 MED ORDER — ADULT MULTIVITAMIN W/MINERALS CH
1.0000 | ORAL_TABLET | Freq: Every day | ORAL | Status: DC
  Administered 2023-08-19 – 2023-09-01 (×14): 1 via ORAL
  Filled 2023-08-16 (×15): qty 1

## 2023-08-16 MED ORDER — LEVOTHYROXINE SODIUM 100 MCG PO TABS
100.0000 ug | ORAL_TABLET | Freq: Every morning | ORAL | Status: DC
  Administered 2023-08-17 – 2023-09-01 (×16): 100 ug via ORAL
  Filled 2023-08-16 (×17): qty 1

## 2023-08-16 MED ORDER — ZINC SULFATE 220 (50 ZN) MG PO CAPS
220.0000 mg | ORAL_CAPSULE | Freq: Every day | ORAL | Status: AC
  Administered 2023-08-17 – 2023-08-21 (×5): 220 mg via ORAL
  Filled 2023-08-16 (×5): qty 1

## 2023-08-16 MED ORDER — DOCUSATE SODIUM 50 MG/5ML PO LIQD
100.0000 mg | Freq: Two times a day (BID) | ORAL | Status: DC
  Administered 2023-08-16 – 2023-08-21 (×7): 100 mg via ORAL
  Filled 2023-08-16 (×11): qty 10

## 2023-08-16 MED ORDER — LEVOCARNITINE 1 GM/10ML PO SOLN
1000.0000 mg | Freq: Two times a day (BID) | ORAL | Status: DC
  Administered 2023-08-16 – 2023-08-19 (×6): 1000 mg via ORAL
  Filled 2023-08-16 (×9): qty 10

## 2023-08-16 MED ORDER — THIAMINE MONONITRATE 100 MG PO TABS
100.0000 mg | ORAL_TABLET | Freq: Every day | ORAL | Status: DC
  Administered 2023-08-17 – 2023-09-01 (×16): 100 mg via ORAL
  Filled 2023-08-16 (×16): qty 1

## 2023-08-16 MED ORDER — ADULT MULTIVITAMIN W/MINERALS CH
1.0000 | ORAL_TABLET | Freq: Two times a day (BID) | ORAL | Status: AC
  Administered 2023-08-16 – 2023-08-18 (×4): 1 via ORAL
  Filled 2023-08-16 (×4): qty 1

## 2023-08-16 MED ORDER — BETHANECHOL CHLORIDE 25 MG PO TABS
25.0000 mg | ORAL_TABLET | Freq: Three times a day (TID) | ORAL | Status: DC
  Administered 2023-08-16 – 2023-08-22 (×16): 25 mg via ORAL
  Filled 2023-08-16 (×18): qty 1

## 2023-08-16 MED ORDER — POLYETHYLENE GLYCOL 3350 17 G PO PACK
17.0000 g | PACK | Freq: Every day | ORAL | Status: DC
  Administered 2023-08-17 – 2023-08-18 (×2): 17 g via ORAL
  Filled 2023-08-16 (×2): qty 1

## 2023-08-16 MED ORDER — LACTULOSE 10 GM/15ML PO SOLN
30.0000 g | Freq: Every day | ORAL | Status: DC
  Administered 2023-08-17: 30 g via ORAL
  Filled 2023-08-16: qty 45

## 2023-08-16 MED ORDER — ASPIRIN 81 MG PO CHEW
81.0000 mg | CHEWABLE_TABLET | Freq: Every day | ORAL | Status: DC
  Administered 2023-08-17 – 2023-09-01 (×16): 81 mg via ORAL
  Filled 2023-08-16 (×16): qty 1

## 2023-08-16 NOTE — Progress Notes (Signed)
 Cardiology Progress Note  Patient ID: Sarah Davis MRN: [id number] DOB: 1960/04/04 Date of Encounter: 08/16/2023 Primary Cardiologist: None  Subjective   Chief Complaint: Confused   HPI: Reports no chest pain or trouble breathing.  Euvolemic on exam.  ROS:  All other ROS reviewed and negative. Pertinent positives noted in the HPI.     Telemetry  Overnight telemetry shows SR 80s, which I personally reviewed.    Physical Exam   Vitals:   08/15/23 2351 08/16/23 0357 08/16/23 0450 08/16/23 0729  BP: (!) 96/52 (!) 96/50  (!) 93/57  Pulse: 79 79  80  Resp: (!) 22 (!) 21  20  Temp: 99.1 F (37.3 C) 99.6 F (37.6 C)  99.1 F (37.3 C)  TempSrc: Axillary Axillary  Axillary  SpO2: 98% 99%  96%  Weight:   58 kg   Height:        Intake/Output Summary (Last 24 hours) at 08/16/2023 1029 Last data filed at 08/16/2023 0402 Gross per 24 hour  Intake 225 ml  Output 2150 ml  Net -1925 ml       08/16/2023    4:50 AM 08/15/2023    5:00 AM 08/14/2023    4:08 AM  Last 3 Weights  Weight (lbs) 127 lb 13.9 oz 137 lb 12.6 oz 131 lb 2.8 oz  Weight (kg) 58 kg 62.5 kg 59.5 kg    Body mass index is 21.28 kg/m.  General: Ill-appearing Head: Atraumatic, normal size  Eyes: PEERLA, EOMI  Neck: Supple, no JVD Endocrine: No thryomegaly Cardiac: Normal S1, S2; RRR; no murmurs, rubs, or gallops Lungs: Clear to auscultation bilaterally, no wheezing, rhonchi or rales  Abd: Soft, nontender, no hepatomegaly  Ext: No edema, pulses 2+ Musculoskeletal: No deformities, BUE and BLE strength normal and equal Skin: Warm and dry, no rashes   Neuro: Awake alert, following commands  Cardiac Studies  TTE 08/01/2023  1. Left ventricular ejection fraction, by estimation, is 45 to 50%. The  left ventricle has mildly decreased function. The left ventricle  demonstrates regional wall motion abnormalities (see scoring  diagram/findings for description). There is mild left  ventricular hypertrophy. Left ventricular  diastolic parameters are  consistent with Grade I diastolic dysfunction (impaired relaxation).   2. Right ventricular systolic function is normal. The right ventricular  size is normal.   3. A small pericardial effusion is present.   4. The mitral valve is normal in structure. Trivial mitral valve  regurgitation.   5. The aortic valve was not well visualized. Aortic valve regurgitation  is not visualized. Mild aortic valve stenosis. Vmax 2.0 m/s, MG , DI  0.52   LHC 07/31/2023 Coronary angiography 07/31/2023: LM: Normal LAD: Mid occlusion, weakly collateralized from RCA/PDA and Lcx Lcx: Patent mid Lcx stent, no restenosis RCA: Patent prox-mid stent, no restenosis   LVEDP 13 mmHg  Patient Profile  Sarah Davis is a 63 y.o. female with CAD, hypertension, tobacco abuse, hyperlipidemia, schizophrenia/bipolar disorder who was admitted on 07/28/2023 to [facility] for acute encephalopathy, non-STEMI and heart failure.  Ultimately transferred to [facility] and underwent left heart catheterization which showed CTO of the LAD.  Cardiology was asked to manage her heart failure and CAD.  Assessment & Plan   # Non-STEMI - Suspect this is multifactorial in the setting of acute illness and acute encephalopathy.  She was admitted with acute liver injury and hyperammonemia. - Left heart cath with chronic total occlusion of the LAD.  This can be managed medically. -  Continue aspirin .  Statin is on hold due to liver injury.  Can reassess this when safe per hospital medicine.  # Chronic systolic heart failure, EF 45 to 50% - Hold GDMT.  BP is too low.  She still has quite a bit of metabolic issues going on.  Suspect this is driving her low blood pressure.  Her ability to tolerate GDMT can be reassessed as an outpatient.  She clinically does not appear stable to drive aggressive heart failure medications.  # CKD 3a -stable  # Encephalopathy # Acute liver injury # Hyperammonemia #  Carnitine deficiency? - Per hospital medicine team.  [facility] will sign off.   The patient is not ready for discharge today from a cardiac standpoint. Medication Recommendations:  As above Other recommendations (labs, testing, etc):  None Follow up as an outpatient: Please notify us  once she is closer to discharge.  We can arrange outpatient follow-up.  Should her condition change or you have questions please reach out.  For questions or updates, please contact [facility] Please consult [email or web address] for contact info under    Signed, Darryle T. Barbaraann, MD, [facility]  [facility]  08/16/2023 10:29 AM

## 2023-08-16 NOTE — Progress Notes (Signed)
 Progress Note   Patient: Ceria Suminski FMW:[id number] DOB: 08/13/1960 DOA: 07/30/2023     17 DOS: the patient was seen and examined on 08/16/2023   Brief hospital course: Mrs. Rudell was transferred from [facility] to [facility] for cardiac catheterization.   63 year old female with past medical history of bipolar, CKD, COPD, coronary artery disease, history of CVA, neuropathy and hypothyroidism, who was admitted to [facility] 07/28/23 with the working diagnosis of acute metabolic encephalopathy due to hypoglycemia. Apparently she had decreased po intake while still using insulin .  Treated with D10 drip.  She was found to have NSTEMI, prompting her transfer.   Underwent cardiac catheterization 07/31/2023 without acute interventions required. Post procedure she had worsening mentation, and  acute respiratory failure. Required intubation emergently. She transferred to the intensive care unit.  6/12 - CT negative for LVO, MRI negative. No seizures on EEG.  6/14 remains somnolent.  6/16 unresponsive, ammonia back in 250 range; started carnitine, increased lactulose  6/17 ammonia back in 70s, still minimally responsive, remaining off sedation 6/21 LFTs elevated 6/23 no improvement in mental status, MRI ok, carnitine stopped as rising LFTs, possible DILI 6/24 spoke 1-2 words 6/25 speaking in phrases 6/26 held brief conversation  06/28 slowly improving mentation, tolerating well tube feedings.   Assessment and Plan: * Acute metabolic encephalopathy Multifactorial, likely related to hyperammonemia.  Liver US  with no focal lesion, with normal parenchymal echogenicity, portal vein is patent on color doppler imaging.  Peaked AST 680 and ALT 336   Possible metabolic defect, carnitine deficiency.   Continue neuro checks per unit protocol Aspiration precautions and nutritional support per cortrack.  Continue lactulose  for now.  Levocarnitine   Carnitine level is pending.  PT and OT   Acute on  chronic diastolic CHF (congestive heart failure) (HCC) Echocardiogram with mild reduction in systolic function 45 to 50%, mild LVH, RV systolic function preserved, small pericardial effusion, mild aortic stenosis   LV hypokinetic apical septal segment, apical anterior segment, apical inferior segment, and apex.   Patient has been diuresed with furosemide  Today clinically euvolemic  Limited medical therapy due to risk of hypotension.   Coronary artery disease NSTEMI with chronic occlusion of LAD.  Continue medical therapy with aspirin .   CKD stage 3a, GFR 45-59 ml/min (HCC) AKI, hypernatremia,, hypokalemia.   Stable renal function and electrolytes with serum cr at 1,34 with K at 4,4 and serum bicarbonate at 28  Na 142   Follow up BMP in 48 hrs   Malnutrition of moderate degree Continue nutritional supplements   Hypothyroidism Continue levothyroxine    Chronic anemia Reactive leukocytosis and chronic anemia Thrombocytopenia has resolved.  Hgb is 7,7  and wbc 10.5  Continue to monitor cell counts   Type 2 diabetes mellitus (HCC) Continue glucose cover and monitoring with insulin  sliding scale.  Qid short acting  Basal 15 units.  Glucose this am is 161 mg/ dl         Subjective: patient today is more reactive she is answering questions and following commands, still limited response but improved from yesterday, she still has cortrack in place.   Physical Exam: Vitals:   08/16/23 0357 08/16/23 0450 08/16/23 0729 08/16/23 1115  BP: (!) 96/50  (!) 93/57 (!) 104/57  Pulse: 79  80 88  Resp: (!) 21  20 20   Temp: 99.6 F (37.6 C)  99.1 F (37.3 C) 99.2 F (37.3 C)  TempSrc: Axillary  Axillary Axillary  SpO2: 99%  96% 100%  Weight:  58 kg  Height:       Neurology awake and alert, responds with simple words and follows commands. Shows understanding when talked to.  ENT with mild pallor, cortrack in place Cardiovascular with S1 and S2 present and regular with no  gallops or rubs, positive systolic murmur at the right lower sternal border Respiratory with no rales or wheezing, poor inspiratory effort Abdomen with mild distention, but soft to palpation and non tender No lower extremity edema.   Foley in place   Data Reviewed:    Family Communication: I spoke with patient's daughter at the bedside, we talked in detail about patient's condition, plan of care and prognosis and all questions were addressed.   Disposition: Status is: Inpatient Remains inpatient appropriate because: recovering encephalopathy   Planned Discharge Destination: Home     Author: Elidia Toribio Furnace, MD 08/16/2023 4:00 PM  For on call review [email or web address].

## 2023-08-17 ENCOUNTER — Inpatient Hospital Stay (HOSPITAL_COMMUNITY): Payer: MEDICAID

## 2023-08-17 DIAGNOSIS — N1831 Chronic kidney disease, stage 3a: Secondary | ICD-10-CM | POA: Diagnosis not present

## 2023-08-17 DIAGNOSIS — I2585 Chronic coronary microvascular dysfunction: Secondary | ICD-10-CM | POA: Diagnosis not present

## 2023-08-17 DIAGNOSIS — G9341 Metabolic encephalopathy: Secondary | ICD-10-CM | POA: Diagnosis not present

## 2023-08-17 DIAGNOSIS — I5033 Acute on chronic diastolic (congestive) heart failure: Secondary | ICD-10-CM | POA: Diagnosis not present

## 2023-08-17 LAB — GLUCOSE, CAPILLARY
Glucose-Capillary: 120 mg/dL — ABNORMAL HIGH (ref 70–99)
Glucose-Capillary: 136 mg/dL — ABNORMAL HIGH (ref 70–99)
Glucose-Capillary: 141 mg/dL — ABNORMAL HIGH (ref 70–99)
Glucose-Capillary: 152 mg/dL — ABNORMAL HIGH (ref 70–99)
Glucose-Capillary: 156 mg/dL — ABNORMAL HIGH (ref 70–99)
Glucose-Capillary: 202 mg/dL — ABNORMAL HIGH (ref 70–99)
Glucose-Capillary: 93 mg/dL (ref 70–99)

## 2023-08-17 MED ORDER — LACTULOSE 10 GM/15ML PO SOLN
30.0000 g | ORAL | Status: DC
  Administered 2023-08-19: 30 g via ORAL
  Filled 2023-08-17 (×2): qty 45

## 2023-08-17 NOTE — Plan of Care (Signed)
   Problem: Education: Goal: Knowledge of General Education information will improve Description: Including pain rating scale, medication(s)/side effects and non-pharmacologic comfort measures Outcome: Progressing   Problem: Safety: Goal: Ability to remain free from injury will improve Outcome: Progressing

## 2023-08-17 NOTE — Progress Notes (Signed)
 Progress Note   Patient: Sarah Davis FMW:[id number] DOB: 20-Apr-1960 DOA: 07/30/2023     18 DOS: the patient was seen and examined on 08/17/2023   Brief hospital course: Sarah Davis was transferred from [facility] to [facility] for cardiac catheterization.   63 year old female with past medical history of bipolar, CKD, COPD, coronary artery disease, history of CVA, neuropathy and hypothyroidism, who was admitted to [facility] 07/28/23 with the working diagnosis of acute metabolic encephalopathy due to hypoglycemia. Apparently she had decreased po intake while still using insulin .  Treated with D10 drip.  She was found to have NSTEMI, prompting her transfer.   Underwent cardiac catheterization 07/31/2023 without acute interventions required. Post procedure she had worsening mentation, and  acute respiratory failure. Required intubation emergently. She transferred to the intensive care unit.  6/12 - CT negative for LVO, MRI negative. No seizures on EEG.  6/14 remains somnolent.  6/16 unresponsive, ammonia back in 250 range; started carnitine, increased lactulose  6/17 ammonia back in 70s, still minimally responsive, remaining off sedation 6/21 LFTs elevated 6/23 no improvement in mental status, MRI ok, carnitine stopped as rising LFTs, possible DILI 6/24 spoke 1-2 words 6/25 speaking in phrases 6/26 held brief conversation  06/28 slowly improving mentation, tolerating well tube feedings.  06/29 mentation continue to improve, per her family at the bedside she is not yet back to her baseline.   Assessment and Plan: * Acute metabolic encephalopathy Multifactorial, likely related to hyperammonemia, severe hypoglycemia on original admission.  Liver US  with no focal lesion, with normal parenchymal echogenicity, portal vein is patent on color doppler imaging.   Possible metabolic deficit, carnitine deficiency.   Continue neuro checks per unit protocol Aspiration precautions and nutritional  support per cortrack.  Continue with Levocarnitine  and vitamins.  Will change lactulose  to q 48 hrs due to severe diarrhea   Carnitine level is pending.  PT and OT   Follow up with speech therapy for swallow dysfunction, hopefully cortrack can be removed soon. Continue aspiration precautions.   Remove foley today  Acute on chronic diastolic CHF (congestive heart failure) (HCC) Echocardiogram with mild reduction in systolic function 45 to 50%, mild LVH, RV systolic function preserved, small pericardial effusion, mild aortic stenosis   LV hypokinetic apical septal segment, apical anterior segment, apical inferior segment, and apex.   Patient has been diuresed with furosemide  Today clinically euvolemic  Limited medical therapy due to risk of hypotension.   Coronary artery disease NSTEMI with chronic occlusion of LAD.  Continue medical therapy with aspirin .   CKD stage 3a, GFR 45-59 ml/min (HCC) AKI, hypernatremia,, hypokalemia.   Check renal function tomorrow, follow up on electrolytes.   Malnutrition of moderate degree Continue nutritional supplements   Hypothyroidism Continue levothyroxine    Chronic anemia Reactive leukocytosis and chronic anemia Thrombocytopenia has resolved.  Hgb is 7,7  and wbc 10.5  Continue to monitor cell counts   Type 2 diabetes mellitus (HCC) Continue glucose cover and monitoring with insulin  sliding scale.  Qid short acting  Basal 15 units.  Glucose 150 range.       Subjective: Patient continue with tube feedings, she has been having diarrhea, and rectal tube in place.   Physical Exam: Vitals:   08/17/23 0727 08/17/23 0850 08/17/23 1113 08/17/23 1145  BP: (!) 94/57  (!) 91/50 (!) 97/48  Pulse: 86  83 85  Resp: 18  (!) 24 18  Temp: 98.4 F (36.9 C)  98.1 F (36.7 C)   TempSrc: Oral  Oral  SpO2: 99% 97% 97% 100%  Weight:      Height:       Neurology awake, responds to simple questions and follows simple commands, very  deconditioned and generalized weakness. Mild rigidity to passive movement with no frank asterixis   ENT with cortrack in place, mild pallor with no icterus Cardiovascular with S1 and S2 present and regular with no gallops, rubs or murmurs Respiratory with poor inspiratory effort with no rales or wheezing Abdomen with no distention  No lower extremity edema Foley in place and large amount of stool in rectal tube collecting bag.   Data Reviewed:    Family Communication: I spoke with patient's family at the bedside, we talked in detail about patient's condition, plan of care and prognosis and all questions were addressed.   Disposition: Status is: Inpatient Remains inpatient appropriate because: recovery from acute illness   Planned Discharge Destination: Skilled nursing facility    Author: Elidia Toribio Furnace, MD 08/17/2023 1:34 PM  For on call review [email or web address].

## 2023-08-18 ENCOUNTER — Inpatient Hospital Stay (HOSPITAL_COMMUNITY): Payer: MEDICAID

## 2023-08-18 DIAGNOSIS — N1831 Chronic kidney disease, stage 3a: Secondary | ICD-10-CM | POA: Diagnosis not present

## 2023-08-18 DIAGNOSIS — I2585 Chronic coronary microvascular dysfunction: Secondary | ICD-10-CM | POA: Diagnosis not present

## 2023-08-18 DIAGNOSIS — G9341 Metabolic encephalopathy: Secondary | ICD-10-CM | POA: Diagnosis not present

## 2023-08-18 DIAGNOSIS — I5033 Acute on chronic diastolic (congestive) heart failure: Secondary | ICD-10-CM | POA: Diagnosis not present

## 2023-08-18 LAB — GLUCOSE, CAPILLARY
Glucose-Capillary: 112 mg/dL — ABNORMAL HIGH (ref 70–99)
Glucose-Capillary: 177 mg/dL — ABNORMAL HIGH (ref 70–99)
Glucose-Capillary: 195 mg/dL — ABNORMAL HIGH (ref 70–99)
Glucose-Capillary: 181 mg/dL — ABNORMAL HIGH (ref 70–99)

## 2023-08-18 LAB — COMPREHENSIVE METABOLIC PANEL WITH GFR
ALT: 81 U/L — ABNORMAL HIGH (ref 0–44)
AST: 64 U/L — ABNORMAL HIGH (ref 15–41)
Albumin: <1.5 g/dL — ABNORMAL LOW (ref 3.5–5.0)
Alkaline Phosphatase: 156 U/L — ABNORMAL HIGH (ref 38–126)
Anion gap: 10 (ref 5–15)
BUN: 28 mg/dL — ABNORMAL HIGH (ref 8–23)
CO2: 27 mmol/L (ref 22–32)
Calcium: 8.5 mg/dL — ABNORMAL LOW (ref 8.9–10.3)
Chloride: 103 mmol/L (ref 98–111)
Creatinine, Ser: 0.96 mg/dL (ref 0.44–1.00)
GFR, Estimated: >60 mL/min (ref >60)
Glucose, Bld: 107 mg/dL — ABNORMAL HIGH (ref 70–99)
Potassium: 4.2 mmol/L (ref 3.5–5.1)
Sodium: 140 mmol/L (ref 135–145)
Total Bilirubin: 0.8 mg/dL (ref 0.0–1.2)
Total Protein: 5.8 g/dL — ABNORMAL LOW (ref 6.5–8.1)

## 2023-08-18 LAB — MAGNESIUM: Magnesium: 2.1 mg/dL (ref 1.7–2.4)

## 2023-08-18 MED ORDER — ENSURE PLUS HIGH PROTEIN PO LIQD
237.0000 mL | Freq: Two times a day (BID) | ORAL | Status: DC
  Administered 2023-08-19 – 2023-09-01 (×26): 237 mL via ORAL

## 2023-08-18 NOTE — TOC Progression Note (Signed)
 Transition of Care [facility]) - Progression Note    Patient Details  Name: Sarah Davis MRN: [id number] Date of Birth: 09-Oct-1960  Transition of Care [facility]) CM/SW Contact  Sarah Davis, [place] Phone Number: 08/18/2023, 11:34 AM  Clinical Narrative:   Per daily meeting with treatment team, patient has cortrak. Barriers to SNF: cortrak and insurance.  CSW called patients mother and daughter Sarah Davis and Sarah Davis) to discuss PT recs for SNF and the barrier to SNF being insurance. No facilities accept trillium at this time. CSW informed family that if patient was to go to SNF it would be private pay. Sarah Davis expressed her understanding verbally. CSW informed family that CIR could possibly be an option but she would need assistance at home after participating in CIR. Family expressed that they can assist patient at home if needed. CSW notified treatment team and CIR.   CSW inquired with Sarah Davis if patient receives disability. Sarah Davis stated patient receives SSI and it is about $900. Sarah Davis inquired about how to apply for disability for her mother. CSW informed Sarah Davis to got to DSS or the [facility].    TOC will continue to follow.    Expected Discharge Plan:  (TBD) Barriers to Discharge: Continued Medical Work up  Expected Discharge Plan and Services       Living arrangements for the past 2 months: Apartment                                       Social Determinants of Health (SDOH) Interventions SDOH Screenings   Food Insecurity: Patient Unable To Answer (08/01/2023)  Housing: Unknown (08/01/2023)  Transportation Needs: Patient Unable To Answer (08/01/2023)  Utilities: Patient Unable To Answer (08/01/2023)  Tobacco Use: High Risk (08/04/2023)    Readmission Risk Interventions     No data to display

## 2023-08-18 NOTE — Evaluation (Signed)
 Modified Barium Swallow Study  Patient Details  Name: Sarah Davis MRN: [id number] Date of Birth: [birth year]  Today's Date: 08/18/2023  Modified Barium Swallow completed.  Full report located under Chart Review in the Imaging Section.  History of Present Illness 63 year old female admitted from [facility] for cardiac cath 07/31/2023 without acute interventions required. MS changes post-procedure, somnolence and resp failure requiring emergent intubation 6/13-20. Dx acute metabolic encephalopathy, hyperammonemia, AKI, CAD with acute NSTEMI. Has cortrak. PMHx stroke 2021, polysubstance abuse, tobacco abuse, bipolar, thyroid disease.   Clinical Impression Pt presents with a mild oral dysphagia complicated primarily by her cognitive status, lack of initiation and encouragement required for full participation. Oral phase marked by slowed manipulation but adequate clearance of POs from oral cavity. There was intermittent high penetration of thin liquids into the laryngeal vestibule but NO aspiration over multiple trials.  There was adequate pharyngeal squeeze and clearance of materials through the pharynx. Pt declined the barium pill as well as regular solids.  Recommend initiating a dysphagia 2 diet with thin liquids; give meds whole in puree. SLP will follow.   DIGEST Swallow Severity Rating*  Safety: 0  Efficiency:0  Overall Pharyngeal Swallow Severity: 0- WFL; dysphagia is primarily oral, mild 1: mild; 2: moderate; 3: severe; 4: profound  *The Dynamic Imaging Grade of Swallowing Toxicity is standardized for the head and neck cancer population, however, demonstrates promising clinical applications across populations to standardize the clinical rating of pharyngeal swallow safety and severity.   Factors that may increase risk of adverse event in presence of aspiration Noe & Lianne 2021): Limited mobility;Reduced cognitive function  Swallow Evaluation Recommendations Recommendations: PO  diet PO Diet Recommendation: Dysphagia 2 (Finely chopped);Thin liquids (Level 0) Liquid Administration via: Cup;Straw Medication Administration: Whole meds with puree Supervision: Staff to assist with self-feeding Oral care recommendations: Oral care BID (2x/day)    Shalae Belmonte L. Vona, MA CCC/SLP Clinical Specialist - Acute Care SLP Acute Rehabilitation Services Office number [phone]   Vona Palma Laurice 08/18/2023,2:07 PM

## 2023-08-18 NOTE — Progress Notes (Signed)
 Nutrition Follow-up  DOCUMENTATION CODES:  Non-severe (moderate) malnutrition in context of chronic illness  INTERVENTION:  Continue current diet as ordered Ordering assistance Encourage PO intake Continue tube feeding via cortrak - will adjust as pt takes in PO: Glucerna 1.2 at 55 ml/h (1320 ml per day) 100mL free water  flush q2h (per MD) Provides 1520 kcal, 87 gm protein, 1063 ml free water  daily (TF+flush = 2263 mL of free water ) Continue the following replacements: Vitamin B6 (100mg  x 14 days), Zinc  Sulfate (220mg  x 14 days), Folic Acid  (1mg  x 30 days) MVI with minerals 1x/d Ensure Plus High Protein po BID, each supplement provides 350 kcal and 20 grams of protein  NUTRITION DIAGNOSIS:  Moderate Malnutrition related to chronic illness as evidenced by severe muscle depletion, moderate fat depletion. - remains applicable  GOAL:  Patient will meet greater than or equal to 90% of their needs - progressing  MONITOR:  TF tolerance, Skin, I & O's, Vent status, Labs  REASON FOR ASSESSMENT:  Consult Enteral/tube feeding initiation and management  ASSESSMENT:  Pt with hx of CKD3, COPD, CAD, hx CVA, and DM type 2 presented to ED from [facility] for cardiac cath.  Pt with ongoing AMS this admission. Rapid response called 6/12 and 6/13. On 6/13 pt ultimately intubated and transferred to ICU.   6/11 - admitted 6/13 - intubated 6/20 - extubated 6/26, 6/27 - BSE, NPO 6/30 - MBS, DYS2/thin liquids  Pt resting in bed at the time of assessment. Much more awake and alert than at previous visits.Pt able to pass swallow evaluation with SLP and now on a DYS2 diet. Pt agreeable to supplements. Discussed trying to eat at each meal so intake trends could be established and TF could begin to be weaned. Pt expresses understanding. Discussed with RN.  Admit weight: 55.1 kg  Current weight: 57 kg   Intake/Output Summary (Last 24 hours) at 08/18/2023 1451 Last data filed at 08/18/2023  1054 Gross per 24 hour  Intake --  Output 3000 ml  Net -3000 ml  Net IO Since Admission: 1,947.73 mL [08/18/23 1451]  Drains/Lines: Cortrak FMS UOP x 24 hours  Average Meal Intake: 6/11: 20% intake x 1 recorded meals  Nutritionally Relevant Medications: Scheduled Meds:  docusate  100 mg Oral BID   folic acid   1 mg Oral Daily   free water   100 mL Per Tube Q2H   insulin  aspart  0-15 Units Subcutaneous Q4H   insulin  aspart  5 Units Subcutaneous Q4H   insulin  glargine-yfgn  15 Units Subcutaneous Daily   lactulose   30 g Oral Q48H   levOCARNitine   1,000 mg Oral BID   levothyroxine   100 mcg Oral q morning   multivitamin with minerals  1 tablet Oral Daily   polyethylene glycol  17 g Oral Daily   pyridOXINE   100 mg Oral Daily   thiamine   100 mg Oral Daily   zinc  sulfate (50mg  elemental zinc )  220 mg Oral Daily   Continuous Infusions:  feeding supplement (GLUCERNA 1.2 CAL) 1,000 mL (08/18/23 0459)   PRN Meds: ondansetron   Labs Reviewed: BUN 28 CBG ranges from 93-202 mg/dL over the last 24 hours HgbA1c 4.8% (6/13)  Micronutrient Profile CRP 3.1, inflammation not high enough to skew labs. Results reported will be true serum levels Vitamin C - 0.5 (WNL) Vitamin D  - 82.63 (WNL) Vitamin E- alpha 15.9, gamma 3.9 (WNL) Vitamin B6 - 1.9 (low) 100mg  x 14 days supplementation added Vitamin B12 - 655 (WNL) Thiamine -  86.0 (WNL) Folate - 3.8 (low), 1mg  daily supplementation added Zinc - 42 (low) 220mg  x 14 days supplementation added Carnitine - pending  NUTRITION - FOCUSED PHYSICAL EXAM: Flowsheet Row Most Recent Value  Orbital Region Moderate depletion  Upper Arm Region No depletion  Thoracic and Lumbar Region Mild depletion  Buccal Region Moderate depletion  Temple Region Mild depletion  Clavicle Bone Region Moderate depletion  Clavicle and Acromion Bone Region Moderate depletion  Scapular Bone Region Moderate depletion  Dorsal Hand Unable to assess  [edema]   Patellar Region Severe depletion  Anterior Thigh Region Severe depletion  Posterior Calf Region Severe depletion  Edema (RD Assessment) Moderate  [hands]  Hair Reviewed  [thin]  Eyes Unable to assess  Mouth Reviewed  [blood noted on lips]  Skin Reviewed  [pin point hemorrhages]  Nails Reviewed    Diet Order:   Diet Order             DIET DYS 2 Room service appropriate? Yes with Assist; Fluid consistency: Thin  Diet effective now                   EDUCATION NEEDS:  Not appropriate for education at this time  Skin:  Skin Assessment: Reviewed RN Assessment  Last BM:  6/29 - type 7 - FMS in place  Height:  Ht Readings from Last 1 Encounters:  07/30/23 5' 5 (1.651 m)    Weight:  Wt Readings from Last 1 Encounters:  08/18/23 57 kg    Ideal Body Weight:  56.8 kg  BMI:  Body mass index is 20.91 kg/m.  Estimated Nutritional Needs:  Kcal:  1500-1700 kcal/d Protein:  75-90g/d Fluid:  >1.5L/d    Vernell Lukes, RD, LDN Registered Dietitian II Please reach out via secure chat

## 2023-08-18 NOTE — Progress Notes (Signed)
 Progress Note   Patient: Sarah Davis FMW:[id number] DOB: 03/31/1960 DOA: 07/30/2023     19 DOS: the patient was seen and examined on 08/18/2023   Brief hospital course: Mrs. Dekay was transferred from [facility] to [facility] for cardiac catheterization.   63 year old female with past medical history of bipolar, CKD, COPD, coronary artery disease, history of CVA, neuropathy and hypothyroidism, who was admitted to [facility] 07/28/23 with the working diagnosis of acute metabolic encephalopathy due to hypoglycemia. Apparently she had decreased po intake while still using insulin .  Treated with D10 drip.  She was found to have NSTEMI, prompting her transfer.   Underwent cardiac catheterization 07/31/2023 without acute interventions required. Post procedure she had worsening mentation, and  acute respiratory failure. Required intubation emergently. She transferred to the intensive care unit.  6/12 - CT negative for LVO, MRI negative. No seizures on EEG.  6/14 remains somnolent.  6/16 unresponsive, ammonia back in 250 range; started carnitine, increased lactulose  6/17 ammonia back in 70s, still minimally responsive, remaining off sedation 6/21 LFTs elevated 6/23 no improvement in mental status, MRI ok, carnitine stopped as rising LFTs, possible DILI 6/24 spoke 1-2 words 6/25 speaking in phrases 6/26 held brief conversation  06/28 slowly improving mentation, tolerating well tube feedings.  06/29 mentation continue to improve, per her family at the bedside she is not yet back to her baseline.  06/30 advanced diet, with improvement in swallow dysfunction.   Assessment and Plan: * Acute metabolic encephalopathy Multifactorial, likely related to hyperammonemia, severe hypoglycemia on original admission.  Liver US  with no focal lesion, with normal parenchymal echogenicity, portal vein is patent on color doppler imaging.   Possible metabolic deficit, carnitine deficiency.   Continue neuro checks  per unit protocol Aspiration precautions and nutritional support  Continue with Levocarnitine  and vitamins.  Continue lactulose  to q 48 hrs due to severe diarrhea   Carnitine level is pending.  PT and OT   Patient did well on her swallow evaluation and diet was advanced.  Will continue with aspiration precautions and will remove cortrak tomorrow  Patient has high risk of aspiration   Remove foley today  Acute on chronic diastolic CHF (congestive heart failure) (HCC) Echocardiogram with mild reduction in systolic function 45 to 50%, mild LVH, RV systolic function preserved, small pericardial effusion, mild aortic stenosis   LV hypokinetic apical septal segment, apical anterior segment, apical inferior segment, and apex.   Patient has been diuresed with furosemide  Today clinically euvolemic  Limited medical therapy due to risk of hypotension.   Coronary artery disease NSTEMI with chronic occlusion of LAD.  Continue medical therapy with aspirin .   CKD stage 3a, GFR 45-59 ml/min (HCC) AKI, hypernatremia,, hypokalemia.   Renal function with serum cr at 0,96 with K at 4,2 and serum bicarbonate at 27  Na 140  AST 64 and ALT 81   Plan to continue supportive medical care Follow up renal function in 48 hrs    Malnutrition of moderate degree Continue nutritional supplements  Transition to full po intake tomorrow   Hypothyroidism Continue levothyroxine    Chronic anemia Reactive leukocytosis and chronic anemia Thrombocytopenia has resolved.  Hgb is 7,7  and wbc 10.5  Continue to monitor cell counts   Type 2 diabetes mellitus (HCC) Continue glucose cover and monitoring with insulin  sliding scale.  Qid short acting  Basal 15 units.  Glucose 107 mg/dl this morning , check Aic hgb         Subjective: Patient more awake  and reactive she passed her swallow evaluation, no dyspnea or chest pain, she failed voiding trial and foley was replaced   Physical Exam: Vitals:    08/18/23 0405 08/18/23 0720 08/18/23 0750 08/18/23 1051  BP: 93/63 110/61  (!) 92/51  Pulse: 83 95  79  Resp: 20 (!) 21  (!) 23  Temp: 98.7 F (37.1 C) 98.5 F (36.9 C)  98.3 F (36.8 C)  TempSrc: Oral Axillary  Axillary  SpO2: 96% 95% 96% 97%  Weight: 57 kg     Height:       Neurology awake and alert, responds to simple questions and follows simple commands ENT with mild pallor, coretrak in place Cardiovascular with S1 and S2 present with no gallops, rubs or murmurs Respiratory with poor inspiratory effort with no wheezing or rhonchi  Abdomen with no distention  No  lower extremity edema, protective boots in place  Data Reviewed:    Family Communication: family at the bedsid3  Disposition: Status is: Inpatient Remains inpatient appropriate because: recovering from encephalopathy   Planned Discharge Destination: to be determined     Author: Elidia Toribio Furnace, MD 08/18/2023 1:32 PM  For on call review [email or web address].

## 2023-08-18 NOTE — Plan of Care (Signed)
  Problem: Education: Goal: Knowledge of General Education information will improve Description: Including pain rating scale, medication(s)/side effects and non-pharmacologic comfort measures 08/18/2023 0445 by Young Jacobsen, RN Outcome: Progressing 08/18/2023 0444 by Young Jacobsen, RN Outcome: Progressing   Problem: Skin Integrity: Goal: Risk for impaired skin integrity will decrease 08/18/2023 0445 by Young Jacobsen, RN Outcome: Progressing 08/18/2023 0444 by Young Jacobsen, RN Outcome: Progressing

## 2023-08-19 DIAGNOSIS — I2510 Atherosclerotic heart disease of native coronary artery without angina pectoris: Secondary | ICD-10-CM | POA: Diagnosis not present

## 2023-08-19 DIAGNOSIS — I5033 Acute on chronic diastolic (congestive) heart failure: Secondary | ICD-10-CM | POA: Diagnosis not present

## 2023-08-19 DIAGNOSIS — N1831 Chronic kidney disease, stage 3a: Secondary | ICD-10-CM | POA: Diagnosis not present

## 2023-08-19 DIAGNOSIS — G9341 Metabolic encephalopathy: Secondary | ICD-10-CM | POA: Diagnosis not present

## 2023-08-19 DIAGNOSIS — I2583 Coronary atherosclerosis due to lipid rich plaque: Secondary | ICD-10-CM

## 2023-08-19 LAB — GLUCOSE, CAPILLARY
Glucose-Capillary: 147 mg/dL — ABNORMAL HIGH (ref 70–99)
Glucose-Capillary: 152 mg/dL — ABNORMAL HIGH (ref 70–99)
Glucose-Capillary: 156 mg/dL — ABNORMAL HIGH (ref 70–99)
Glucose-Capillary: 182 mg/dL — ABNORMAL HIGH (ref 70–99)
Glucose-Capillary: 214 mg/dL — ABNORMAL HIGH (ref 70–99)
Glucose-Capillary: 240 mg/dL — ABNORMAL HIGH (ref 70–99)

## 2023-08-19 LAB — HEMOGLOBIN A1C
Hgb A1c MFr Bld: 6 % — ABNORMAL HIGH (ref 4.8–5.6)
Mean Plasma Glucose: 125.5 mg/dL

## 2023-08-19 MED ORDER — ENOXAPARIN SODIUM 40 MG/0.4ML IJ SOSY
40.0000 mg | PREFILLED_SYRINGE | Freq: Every day | INTRAMUSCULAR | Status: DC
  Administered 2023-08-19 – 2023-08-31 (×12): 40 mg via SUBCUTANEOUS
  Filled 2023-08-19 (×12): qty 0.4

## 2023-08-19 MED ORDER — INSULIN GLARGINE-YFGN 100 UNIT/ML ~~LOC~~ SOLN
8.0000 [IU] | Freq: Every day | SUBCUTANEOUS | Status: DC
  Administered 2023-08-20 – 2023-08-25 (×6): 8 [IU] via SUBCUTANEOUS
  Filled 2023-08-19 (×6): qty 0.08

## 2023-08-19 MED ORDER — INSULIN ASPART 100 UNIT/ML IJ SOLN
0.0000 [IU] | Freq: Three times a day (TID) | INTRAMUSCULAR | Status: DC
  Administered 2023-08-19: 2 [IU] via SUBCUTANEOUS
  Administered 2023-08-20: 3 [IU] via SUBCUTANEOUS
  Administered 2023-08-20 – 2023-08-21 (×3): 2 [IU] via SUBCUTANEOUS
  Administered 2023-08-21: 3 [IU] via SUBCUTANEOUS
  Administered 2023-08-21 – 2023-08-22 (×2): 2 [IU] via SUBCUTANEOUS
  Administered 2023-08-22: 7 [IU] via SUBCUTANEOUS
  Administered 2023-08-23: 1 [IU] via SUBCUTANEOUS
  Administered 2023-08-23 – 2023-08-24 (×3): 2 [IU] via SUBCUTANEOUS
  Administered 2023-08-24: 9 [IU] via SUBCUTANEOUS
  Administered 2023-08-24: 2 [IU] via SUBCUTANEOUS
  Administered 2023-08-25: 1 [IU] via SUBCUTANEOUS
  Administered 2023-08-25: 3 [IU] via SUBCUTANEOUS
  Administered 2023-08-25: 2 [IU] via SUBCUTANEOUS
  Administered 2023-08-26: 1 [IU] via SUBCUTANEOUS
  Administered 2023-08-26: 2 [IU] via SUBCUTANEOUS
  Administered 2023-08-27: 1 [IU] via SUBCUTANEOUS
  Administered 2023-08-27 – 2023-08-28 (×3): 2 [IU] via SUBCUTANEOUS
  Administered 2023-08-28: 1 [IU] via SUBCUTANEOUS
  Administered 2023-08-28 – 2023-08-31 (×7): 2 [IU] via SUBCUTANEOUS
  Administered 2023-08-31: 1 [IU] via SUBCUTANEOUS
  Administered 2023-08-31: 5 [IU] via SUBCUTANEOUS
  Administered 2023-09-01: 1 [IU] via SUBCUTANEOUS

## 2023-08-19 MED ORDER — INSULIN ASPART 100 UNIT/ML IJ SOLN
3.0000 [IU] | Freq: Three times a day (TID) | INTRAMUSCULAR | Status: DC
  Administered 2023-08-19 – 2023-08-25 (×16): 3 [IU] via SUBCUTANEOUS

## 2023-08-19 NOTE — TOC Progression Note (Signed)
 Transition of Care [facility]) - Progression Note    Patient Details  Name: Sarah Davis MRN: [id number] Date of Birth: 1960/08/20  [facility] CM/SW Contact  Luise JAYSON Pan, [place] Phone Number: 08/19/2023, 11:46 AM  Clinical Narrative:   CSW spoke with [name], per her request, and [name] stated she would like to know more about how Physical Therapy is working with this patient during her admission. CSW notified Physical Therapy that [name] would like to speak with them. [name] stated she would like for patient to receive as much care from PT as possible, to see if patient will recover back to baseline, before determining a plan for after this admission. Per [name] and chart review, patient has only worked with PT once since being admitted and recently today. CSW to await [name] and PT to discuss current therapy plans for admission and follow up at a later time regarding discharge plan.   TOC will continue to follow.    Expected Discharge Plan:  (TBD) Barriers to Discharge: Continued Medical Work up  Expected Discharge Plan and Services       Living arrangements for the past 2 months: Apartment                                       Social Determinants of Health (SDOH) Interventions SDOH Screenings   Food Insecurity: Patient Unable To Answer (08/01/2023)  Housing: Unknown (08/01/2023)  Transportation Needs: Patient Unable To Answer (08/01/2023)  Utilities: Patient Unable To Answer (08/01/2023)  Tobacco Use: High Risk (08/04/2023)    Readmission Risk Interventions     No data to display

## 2023-08-19 NOTE — Progress Notes (Addendum)
 Progress Note   Patient: Sarah Davis FMW:[id number] DOB: Aug 08, 1960 DOA: 07/30/2023     20 DOS: the patient was seen and examined on 08/19/2023   Brief hospital course: Sarah Davis was transferred from [facility] to [facility] for cardiac catheterization.   63 year old female with past medical history of bipolar, CKD, COPD, coronary artery disease, history of CVA, neuropathy and hypothyroidism, who was admitted to [facility] 07/28/23 with the working diagnosis of acute metabolic encephalopathy due to hypoglycemia. Apparently she had decreased po intake while still using insulin .  Treated with D10 drip.  She was found to have NSTEMI, prompting her transfer.   Underwent cardiac catheterization 07/31/2023 without acute interventions required. Post procedure she had worsening mentation, and  acute respiratory failure. Required intubation emergently. She transferred to the intensive care unit.  6/12 - CT negative for LVO, MRI negative. No seizures on EEG.  6/14 remains somnolent.  6/16 unresponsive, ammonia back in 250 range; started carnitine, increased lactulose  6/17 ammonia back in 70s, still minimally responsive, remaining off sedation 6/21 LFTs elevated 6/23 no improvement in mental status, MRI ok, carnitine stopped as rising LFTs, possible DILI 6/24 spoke 1-2 words 6/25 speaking in phrases 6/26 held brief conversation  06/28 slowly improving mentation, tolerating well tube feedings.  06/29 mentation continue to improve, per her family at the bedside she is not yet back to her baseline.  06/30 advanced diet, with improvement in swallow dysfunction.  07/01 Cortrak removed.   Assessment and Plan: * Acute metabolic encephalopathy Multifactorial, likely related to hyperammonemia, severe hypoglycemia on original admission.  Liver US  with no focal lesion, with normal parenchymal echogenicity, portal vein is patent on color doppler imaging.   Clinically she has been slowly improving.   Continue neuro checks per unit protocol Aspiration precautions and nutritional support  Carnitine level > 400, will discontinue Levocarnitine   Continue lactulose  to q 48 hrs due to severe diarrhea    PT and OT   Coretrak to be removed today and continue with po intake.   Urinary retention, she failed voiding trial on 06/29, had to replace foley cathter   Acute on chronic diastolic CHF (congestive heart failure) (HCC) Echocardiogram with mild reduction in systolic function 45 to 50%, mild LVH, RV systolic function preserved, small pericardial effusion, mild aortic stenosis   LV hypokinetic apical septal segment, apical anterior segment, apical inferior segment, and apex.   Patient has been diuresed with furosemide  Today clinically euvolemic  Limited medical therapy due to risk of hypotension.   Coronary artery disease NSTEMI with chronic occlusion of LAD.  Continue medical therapy with aspirin .   CKD stage 3a, GFR 45-59 ml/min (HCC) AKI, hypernatremia,, hypokalemia.   Renal function has been stable, will follow up renal function and electrolytes in am.   Plan to continue supportive medical care  Malnutrition of moderate degree Continue nutritional supplements.   Hypothyroidism Continue levothyroxine    Chronic anemia Reactive leukocytosis and chronic anemia Thrombocytopenia has resolved.  Hgb is 7,7  and wbc 10.5  Continue to monitor cell counts   Type 2 diabetes mellitus (HCC) Continue glucose cover and monitoring with insulin  sliding scale.  Basal and short acting pre meal  Fasting this am glucose was 152 mg/dl, now that will discontinue tube feedings will decrease dose of insulin  basal and short acting to avoid hypoglycemia.      Subjective: Patient has been more reactive, she denies any chest pain and dyspnea, continue poorly communicative   Physical Exam: Vitals:   08/19/23 0406  08/19/23 0714 08/19/23 0744 08/19/23 1101  BP: (!) 101/54 106/63  105/64   Pulse: 83 78  100  Resp: 17 (!) 21  (!) 23  Temp: 98.2 F (36.8 C) 98.6 F (37 C)  98.5 F (36.9 C)  TempSrc: Axillary Oral  Oral  SpO2: 93% 93% 97% 95%  Weight: 55 kg     Height:       Neurology awake and alert, responds to simple questions and follows simple commands ENT with mild pallor with no icterus Cardiovascular with S1 and S2 present and regular with no gallops, rubs or murmurs Respiratory with no wheezing or rhonchi on anterior auscultation, poor inspiratory effort Abdomen with no distention  No lower extremity edema, protective boots in place Foley catheter and rectal tube in place  Data Reviewed:    Family Communication: no family at the bedside   Disposition: Status is: Inpatient Remains inpatient appropriate because: neurologic recovery   Planned Discharge Destination: Skilled nursing facility/ CIR      Author: Elidia Toribio Furnace, MD 08/19/2023 3:27 PM  For on call review [email or web address].

## 2023-08-19 NOTE — Progress Notes (Signed)
 Physical Therapy Treatment Patient Details Name: Sarah Davis MRN: [id number] DOB: [birth year] Today's Date: 08/19/2023   History of Present Illness 63 yo female admitted 07/30/23. 6/12 cardiac cath with somnolence and respiratory failure post procedure requiring intubation 6/13-6/20. NSTEMI, encephalopathy and AKI impacting course. PMhx: polysubstance abuse, bipolar disorder, thyroid disease, hep C, COPD, DM, HFrEF, CVA with Rt hemiparesis    PT Comments  Pt with improved communication and participation although demonstrates left weakness compared to right but also exhibits impaired initiation and command following as well as right gaze with chart stating prior Rt hemiparesis. Pt unable to state changes from baseline and family not present. Pt moving all extremities with delay and transitioned to EOB with significant posterior bias and max assist. Will continue to follow to progress strength and function.    If plan is discharge home, recommend the following: Two people to help with walking and/or transfers;Two people to help with bathing/dressing/bathroom;Direct supervision/assist for medications management;Assistance with feeding;Assistance with cooking/housework;Direct supervision/assist for financial management;Assist for transportation;Supervision due to cognitive status   Can travel by private vehicle     No  Equipment Recommendations  Wheelchair (measurements PT);Hospital bed;Wheelchair cushion (measurements PT)    Recommendations for Other Services       Precautions / Restrictions Precautions Precautions: Fall;Other (comment) Recall of Precautions/Restrictions: Impaired Precaution/Restrictions Comments: cortrak, flexiseal     Mobility  Bed Mobility Overal bed mobility: Needs Assistance Bed Mobility: Rolling, Supine to Sit, Sit to Supine Rolling: Max assist   Supine to sit: Max assist, +2 for physical assistance, +2 for safety/equipment, HOB elevated, Used rails Sit to supine: +2  for physical assistance, +2 for safety/equipment, Total assist   General bed mobility comments: max assist to cue to look to left with increased time and multimodal cues, pt unable to fully grasp rail to assist with roll and transfer. Max +2 for semi roll to side and pivot to sit. Total +2 to return to supine and slide to [facility]. Posterior bias with max assist for sitting EOB 3 min with maintained right neck rotation and gaze    Transfers                        Ambulation/Gait                   Stairs             Wheelchair Mobility     Tilt Bed    Modified Rankin (Stroke Patients Only)       Balance Overall balance assessment: Needs assistance Sitting-balance support: Feet supported, Bilateral upper extremity supported, No upper extremity supported Sitting balance-Leahy Scale: Zero Sitting balance - Comments: max A sitting balance                                    Communication Communication Communication: Impaired Factors Affecting Communication: Difficulty expressing self  Cognition Arousal: Alert Behavior During Therapy: Flat affect   PT - Cognitive impairments: Difficult to assess Difficult to assess due to: Impaired communication                     PT - Cognition Comments: pt would state name, at times respond with short phrases, delayed response, right gaze Following commands: Impaired Following commands impaired: Follows one step commands inconsistently, Follows one step commands with increased time    Cueing Cueing Techniques: Verbal cues,  Gestural cues, Tactile cues  Exercises General Exercises - Lower Extremity Short Arc Quad: AAROM, Both, 10 reps, Supine Heel Slides: AAROM, Both, 10 reps, Supine    General Comments        Pertinent Vitals/Pain Pain Assessment Pain Assessment: No/denies pain    Home Living                          Prior Function            PT Goals (current goals can  now be found in the care plan section) Progress towards PT goals: Progressing toward goals    Frequency    Min 2X/week      PT Plan      Co-evaluation              AM-PAC PT 6 Clicks Mobility   Outcome Measure  Help needed turning from your back to your side while in a flat bed without using bedrails?: Total Help needed moving from lying on your back to sitting on the side of a flat bed without using bedrails?: Total Help needed moving to and from a bed to a chair (including a wheelchair)?: Total Help needed standing up from a chair using your arms (e.g., wheelchair or bedside chair)?: Total Help needed to walk in hospital room?: Total Help needed climbing 3-5 steps with a railing? : Total 6 Click Score: 6    End of Session   Activity Tolerance: Patient tolerated treatment well Patient left: in bed;with call bell/phone within reach;with bed alarm set Nurse Communication: Mobility status;Need for lift equipment PT Visit Diagnosis: Other abnormalities of gait and mobility (R26.89);Difficulty in walking, not elsewhere classified (R26.2);Other symptoms and signs involving the nervous system (R29.898)     Time: 9240-9176 PT Time Calculation (min) (ACUTE ONLY): 24 min  Charges:    $Therapeutic Activity: 23-37 mins PT General Charges $$ ACUTE PT VISIT: 1 Visit                     Lenoard SQUIBB, PT Acute Rehabilitation Services Office: [phone]    Lenoard NOVAK Stephen Turnbaugh 08/19/2023, 10:46 AM

## 2023-08-19 NOTE — Plan of Care (Signed)
  Problem: Clinical Measurements: Goal: Ability to maintain clinical measurements within normal limits will improve Outcome: Progressing Goal: Will remain free from infection Outcome: Progressing Goal: Diagnostic test results will improve Outcome: Progressing Goal: Respiratory complications will improve Outcome: Progressing Goal: Cardiovascular complication will be avoided Outcome: Progressing   Problem: Nutrition: Goal: Adequate nutrition will be maintained Outcome: Progressing   Problem: Coping: Goal: Level of anxiety will decrease Outcome: Progressing   Problem: Elimination: Goal: Will not experience complications related to bowel motility Outcome: Progressing   Problem: Pain Managment: Goal: General experience of comfort will improve and/or be controlled Outcome: Progressing   Problem: Safety: Goal: Ability to remain free from injury will improve Outcome: Progressing   Problem: Skin Integrity: Goal: Risk for impaired skin integrity will decrease Outcome: Progressing   Problem: Cardiac: Goal: Ability to achieve and maintain adequate cardiovascular perfusion will improve Outcome: Progressing

## 2023-08-19 NOTE — Progress Notes (Signed)
 Speech Language Pathology Treatment: Dysphagia  Patient Details Name: Sarah Davis MRN: [id number] DOB: [birth year] Today's Date: 08/19/2023 Time: 8364-8349 SLP Time Calculation (min) (ACUTE ONLY): 15 min  Assessment / Plan / Recommendation Clinical Impression  Patient seen by SLP for skilled treatment focused on dysphagia goals. Patient was awake, alert but requires frequent cues for attention and to respond to basic wants/needs and biographical questions. She declined to have any solid texture PO's but did indicate she was thirsty and when given choices, she chose cranberry juice. After SLP assisted with holding cup in her hand, she was able to direct straw to mouth to drink. Suspected swallow initiation delay versus oral delay suspected but no overt s/s aspiration even with consecutive straw sips. SLP placed cup on table and directed patient to attempt to pick it up to see if she could access it without assistance. Unfortunately, patient unable to perform this action without extensive cueing and so SLP moved PO's out of reach of patient for safety. SLP will continue to follow.   HPI HPI: 63 year old female admitted from [facility] for cardiac cath 07/31/2023 without acute interventions required. MS changes post-procedure, somnolence and resp failure requiring emergent intubation 6/13-20. Dx acute metabolic encephalopathy, hyperammonemia, AKI, CAD with acute NSTEMI. Has cortrak. PMHx stroke 2021, polysubstance abuse, tobacco abuse, bipolar, thyroid disease.      SLP Plan  Continue with current plan of care          Recommendations  Diet recommendations: Dysphagia 2 (fine chop);Thin liquid Liquids provided via: Straw Medication Administration: Whole meds with puree Supervision: Patient able to self feed;Full supervision/cueing for compensatory strategies;Staff to assist with self feeding Compensations: Slow rate;Small sips/bites;Minimize environmental distractions Postural Changes and/or  Swallow Maneuvers: Seated upright 90 degrees                  Oral care BID;Staff/trained caregiver to provide oral care   Frequent or constant Supervision/Assistance Dysphagia, oropharyngeal phase (R13.12)     Continue with current plan of care    Norleen IVAR Blase, MA, CCC-SLP Speech Therapy

## 2023-08-20 ENCOUNTER — Inpatient Hospital Stay (HOSPITAL_COMMUNITY): Payer: MEDICAID

## 2023-08-20 DIAGNOSIS — G9341 Metabolic encephalopathy: Secondary | ICD-10-CM | POA: Diagnosis not present

## 2023-08-20 LAB — COMPREHENSIVE METABOLIC PANEL WITH GFR
ALT: 83 U/L — ABNORMAL HIGH (ref 0–44)
AST: 75 U/L — ABNORMAL HIGH (ref 15–41)
Albumin: 1.7 g/dL — ABNORMAL LOW (ref 3.5–5.0)
Alkaline Phosphatase: 189 U/L — ABNORMAL HIGH (ref 38–126)
Anion gap: 11 (ref 5–15)
BUN: 25 mg/dL — ABNORMAL HIGH (ref 8–23)
CO2: 25 mmol/L (ref 22–32)
Calcium: 8.6 mg/dL — ABNORMAL LOW (ref 8.9–10.3)
Chloride: 102 mmol/L (ref 98–111)
Creatinine, Ser: 1.01 mg/dL — ABNORMAL HIGH (ref 0.44–1.00)
GFR, Estimated: >60 mL/min (ref >60)
Glucose, Bld: 168 mg/dL — ABNORMAL HIGH (ref 70–99)
Potassium: 4.5 mmol/L (ref 3.5–5.1)
Sodium: 138 mmol/L (ref 135–145)
Total Bilirubin: 0.5 mg/dL (ref 0.0–1.2)
Total Protein: 6.2 g/dL — ABNORMAL LOW (ref 6.5–8.1)

## 2023-08-20 LAB — GLUCOSE, CAPILLARY
Glucose-Capillary: 168 mg/dL — ABNORMAL HIGH (ref 70–99)
Glucose-Capillary: 202 mg/dL — ABNORMAL HIGH (ref 70–99)
Glucose-Capillary: 195 mg/dL — ABNORMAL HIGH (ref 70–99)
Glucose-Capillary: 122 mg/dL — ABNORMAL HIGH (ref 70–99)

## 2023-08-20 LAB — PROCALCITONIN: Procalcitonin: 0.78 ng/mL

## 2023-08-20 LAB — CBC
HCT: 28 % — ABNORMAL LOW (ref 36.0–46.0)
Hemoglobin: 8.5 g/dL — ABNORMAL LOW (ref 12.0–15.0)
MCH: 26.5 pg (ref 26.0–34.0)
MCHC: 30.4 g/dL (ref 30.0–36.0)
MCV: 87.2 fL (ref 80.0–100.0)
Platelets: 459 10*3/uL — ABNORMAL HIGH (ref 150–400)
RBC: 3.21 MIL/uL — ABNORMAL LOW (ref 3.87–5.11)
RDW: 20.1 % — ABNORMAL HIGH (ref 11.5–15.5)
WBC: 18.4 10*3/uL — ABNORMAL HIGH (ref 4.0–10.5)
nRBC: 0.4 % — ABNORMAL HIGH (ref 0.0–0.2)

## 2023-08-20 LAB — URINALYSIS, ROUTINE W REFLEX MICROSCOPIC
Bilirubin Urine: NEGATIVE
Glucose, UA: NEGATIVE mg/dL
Hgb urine dipstick: NEGATIVE
Ketones, ur: NEGATIVE mg/dL
Nitrite: POSITIVE — AB
Protein, ur: NEGATIVE mg/dL
Specific Gravity, Urine: 1.005 (ref 1.005–1.030)
pH: 7 (ref 5.0–8.0)

## 2023-08-20 MED ORDER — LEVOCARNITINE 1 GM/10ML PO SOLN
500.0000 mg | Freq: Two times a day (BID) | ORAL | Status: DC
  Filled 2023-08-20 (×2): qty 5

## 2023-08-20 NOTE — Progress Notes (Signed)
 Occupational Therapy Treatment Patient Details Name: Kimya Mccahill MRN: [id number] DOB: 1960/08/05 Today's Date: 08/20/2023   History of present illness 63 yo female admitted 07/30/23. 6/12 cardiac cath with somnolence and respiratory failure post procedure requiring intubation 6/13-6/20. NSTEMI, encephalopathy and AKI impacting course. PMhx: polysubstance abuse, bipolar disorder, thyroid disease, hep C, COPD, DM, HFrEF, CVA with Rt hemiparesis   OT comments  Patient received in supine with her mother present. Patient instructed on bed mobility and attempting to move BLEs off EOB and required max assist to get to EOB. Once on EOB patient required max assist with sitting balance due to posterior leaning with increased assistance needed before returning to supine. Patient able to perform reaching with RUE and wash face while seated on EOB. Patient was total assist to return to supine and positioned for comfort. Patient will benefit from continued inpatient follow up therapy, <3 hours/day. Acute OT to continue to follow to address established goals to facilitate DC to next venue of care.        If plan is discharge home, recommend the following:  A lot of help with walking and/or transfers;Two people to help with walking and/or transfers;A lot of help with bathing/dressing/bathroom;Two people to help with bathing/dressing/bathroom   Equipment Recommendations  Other (comment) (TBD)    Recommendations for Other Services      Precautions / Restrictions Precautions Precautions: Fall;Other (comment) Recall of Precautions/Restrictions: Impaired Precaution/Restrictions Comments: flexiseal Restrictions Weight Bearing Restrictions Per Provider Order: No       Mobility Bed Mobility Overal bed mobility: Needs Assistance Bed Mobility: Rolling, Supine to Sit, Sit to Supine Rolling: Max assist   Supine to sit: Max assist Sit to supine: Total assist   General bed mobility comments: patient attempting  to assist with moving BLEs towards EOB but requiring max assist to complete. Total assist to return to supine    Transfers                   General transfer comment: deferred     Balance Overall balance assessment: Needs assistance Sitting-balance support: Feet supported, Bilateral upper extremity supported, No upper extremity supported Sitting balance-Leahy Scale: Zero Sitting balance - Comments: max assist for sitting balance with strong posterior leaning Postural control: Posterior lean                                 ADL either performed or assessed with clinical judgement   ADL Overall ADL's : Needs assistance/impaired     Grooming: Wash/dry face;Minimal assistance;Sitting Grooming Details (indicate cue type and reason): used RUE to wash face while seated on EOB and min assist to complete                               General ADL Comments: able to participate in light grooming    Extremity/Trunk Assessment Upper Extremity Assessment RUE Deficits / Details: hx of CVA affecting this UE per daughter but moving this UE more than LUE during session today. able to grasp, lift this UE multiple times LUE Deficits / Details: able to grasp with this UE, less functional reaching noted            Vision       Perception     Praxis     Communication Communication Communication: Impaired Factors Affecting Communication: Difficulty expressing self   Cognition Arousal: Alert  Behavior During Therapy: Flat affect Cognition: Difficult to assess Difficult to assess due to: Impaired communication           OT - Cognition Comments: limited communication, mother present encouraging patient to participate and to be more vocal                 Following commands: Impaired Following commands impaired: Follows one step commands inconsistently, Follows one step commands with increased time      Cueing   Cueing Techniques: Verbal cues,  Gestural cues, Tactile cues  Exercises      Shoulder Instructions       General Comments      Pertinent Vitals/ Pain       Pain Assessment Pain Assessment: Faces Faces Pain Scale: Hurts a little bit Pain Location: sacral area Pain Descriptors / Indicators: Grimacing, Guarding Pain Intervention(s): Monitored during session, Limited activity within patient's tolerance, Repositioned  Home Living                                          Prior Functioning/Environment              Frequency  Min 2X/week        Progress Toward Goals  OT Goals(current goals can now be found in the care plan section)  Progress towards OT goals: Progressing toward goals  Acute Rehab OT Goals Patient Stated Goal: patient to have increased mobility OT Goal Formulation: With family Time For Goal Achievement: 08/29/23 Potential to Achieve Goals: Fair ADL Goals Pt Will Perform Grooming: with mod assist;bed level Pt Will Perform Upper Body Bathing: with mod assist;sitting Pt/caregiver will Perform Home Exercise Program: Increased strength;Both right and left upper extremity;With minimal assist;With written HEP provided Additional ADL Goal #1: Pt to follow one step commands > 75% of the time during functional tasks Additional ADL Goal #2: Pt to maintain sitting balance unsupported with no more than Min A during functional tasks  Plan      Co-evaluation                 AM-PAC OT 6 Clicks Daily Activity     Outcome Measure   Help from another person eating meals?: Total Help from another person taking care of personal grooming?: A Lot Help from another person toileting, which includes using toliet, bedpan, or urinal?: Total Help from another person bathing (including washing, rinsing, drying)?: Total Help from another person to put on and taking off regular upper body clothing?: Total Help from another person to put on and taking off regular lower body clothing?:  Total 6 Click Score: 7    End of Session    OT Visit Diagnosis: Other abnormalities of gait and mobility (R26.89);Muscle weakness (generalized) (M62.81);Other symptoms and signs involving cognitive function   Activity Tolerance Patient tolerated treatment well   Patient Left in bed;with call bell/phone within reach   Nurse Communication Mobility status        Time: 8693-8672 OT Time Calculation (min): 21 min  Charges: OT General Charges $OT Visit: 1 Visit OT Treatments $Therapeutic Activity: 8-22 mins  Dick Laine, OTA Acute Rehabilitation Services  Office [phone]   Jeb LITTIE Laine 08/20/2023, 2:19 PM

## 2023-08-20 NOTE — Progress Notes (Signed)
 Speech Language Pathology Treatment: Dysphagia  Patient Details Name: Sarah Davis MRN: [id number] DOB: 1960-10-20 Today's Date: 08/20/2023 Time: 8489-8476 SLP Time Calculation (min) (ACUTE ONLY): 13 min  Assessment / Plan / Recommendation Clinical Impression  Skilled therapy session focused on dysphagia goals. SLP facilitated session by observing patient with D2 solids and thin liquids via straw. Patient with timely mastication and complete oral clearance. Some impulsivity observed with thin liquids resulting in x1 immediate cough after consecutive straw sips. No other s/sx of aspiration observed when patient consumed small, single sips. SLP educated caregiver on finger occulding straw if patient attempts to drink liquid too quickly. Caregiver verbalized understanding. Recommend continuation of current diet with full supervision.    HPI HPI: 63 year old female admitted from [facility] for cardiac cath 07/31/2023 without acute interventions required. MS changes post-procedure, somnolence and resp failure requiring emergent intubation 6/13-20. Dx acute metabolic encephalopathy, hyperammonemia, AKI, CAD with acute NSTEMI. PMHx stroke 2021, polysubstance abuse, tobacco abuse, bipolar, thyroid disease. MBS rec D2/thin      SLP Plan  Continue with current plan of care          Recommendations  Diet recommendations: Dysphagia 2 (fine chop);Thin liquid Liquids provided via: Straw Medication Administration: Whole meds with puree Supervision: Staff to assist with self feeding Compensations: Slow rate;Small sips/bites;Minimize environmental distractions Postural Changes and/or Swallow Maneuvers: Seated upright 90 degrees        Oral care BID;Staff/trained caregiver to provide oral care   Frequent or constant Supervision/Assistance Dysphagia, oropharyngeal phase (R13.12)     Continue with current plan of care     Marna Weniger M.A., CCC-SLP 08/20/2023, 3:35 PM

## 2023-08-20 NOTE — Progress Notes (Signed)
 PROGRESS NOTE    Sarah Davis  FMW:[id number] DOB: June 12, 1960 DOA: 07/30/2023 PCP: Patient, No Pcp Per  Sarah Davis was transferred from [facility] to [facility] for cardiac catheterization.  62/F w history of bipolar, CKD, COPD, coronary artery disease, history of CVA, neuropathy and hypothyroidism, who was admitted to [facility] 07/28/23 with the working diagnosis of acute metabolic encephalopathy due to hypoglycemia.Treated with D10 drip.  She was found to have NSTEMI, prompting her transfer.  -Underwent cardiac catheterization 07/31/2023 noted to have chronic occlusion of mid LAD, medical management recommended. Post procedure she had worsening mentation, and  acute respiratory failure. Required intubation emergently. She transferred to the intensive care unit.  6/12 - CT negative for LVO, MRI negative for acute findings, advanced chronic microvascular ischemia,  no seizures on EEG.  6/14 remains somnolent.  6/16 unresponsive, ammonia back in 250 range; started carnitine, increased lactulose  6/17 ammonia back in 70s, still minimally responsive, remaining off sedation 6/21 LFTs elevated 6/23 no improvement in mental status, MRI ok, carnitine stopped as rising LFTs, possible DILI 6/24 spoke 1-2 words 6/25 speaking in phrases 6/26 held brief conversation -6/27, transferred from ICU to [facility] service   -6/28 slowly improving mentation, tolerating well tube feedings.  -6/29 mentation continues to improve, per her family at the bedside she is not yet back to her baseline.  -6/30 advanced diet, with improvement in swallow dysfunction.  -7/01 Cortrak removed.  -7/2: WBC upto 18K, foley removed   Subjective:   Assessment and Plan:  Acute metabolic encephalopathy Multifactorial, likely related to hyperammonemia, severe hypoglycemia on original admission.  Liver US  with no focal lesion, portal vein is patent -Clinically she has been slowly improving.  -Aspiration precautions, increase  activity, PT OT Carnitine level > 400, will discontinue Levocarnitine   - In terms of hyperammonemia, she may have fatty liver disease from NASH or related to psychotropic meds - Core track removed, diet as tolerated  Leukocytosis - Had urinary catheter placed for retention on 6/29, will DC catheter - Check urine culture - CBC in a.m. - Monitor for recurrent retention  Acute on chronic systolic and diastolic CHF Echocardiogram with mild reduction in systolic function 45 to 50%, mild LVH, RV systolic function preserved, small pericardial effusion, mild aortic stenosis   LV hypokinetic apical septal segment, apical anterior segment, apical inferior segment, and apex.  - Diuresed with IV Lasix  earlier this admission, now appears euvolemic, GDMT limited by hypotension -add lasix  PRN  Coronary artery disease NSTEMI with chronic occlusion of LAD.  Continue medical therapy with aspirin .   CKD stage 3a, GFR 45-59 ml/min (HCC) AKI, hypernatremia,, hypokalemia.  - AKI has resolved  Malnutrition of moderate degree Continue nutritional supplements.   Hypothyroidism Continue levothyroxine    Chronic anemia Reactive leukocytosis and chronic anemia Thrombocytopenia has resolved.  Hgb is 7,7  and wbc 10.5  Continue to monitor cell counts   Type 2 diabetes mellitus (HCC) Continue glucose cover and monitoring with insulin  sliding scale.  Basal and short acting pre meal  DVT prophylaxis: Lovenox Code Status: Full code Family Communication: None present Disposition Plan: Uncertain at this time, difficulties with SNF placement  Consultants:    Procedures:   Antimicrobials:    Objective: Vitals:   08/20/23 0402 08/20/23 0403 08/20/23 0409 08/20/23 0808  BP: (!) 96/58   115/66  Pulse: 98   86  Resp: 18   16  Temp: 99.1 F (37.3 C)   98.5 F (36.9 C)  TempSrc:    Oral  SpO2: 90%   98%  Weight:  55 kg 55 kg   Height:        Intake/Output Summary (Last 24 hours) at 08/20/2023  1138 Last data filed at 08/20/2023 0801 Gross per 24 hour  Intake 240 ml  Output 1335 ml  Net -1095 ml   Filed Weights   08/19/23 0406 08/20/23 0403 08/20/23 0409  Weight: 55 kg 55 kg 55 kg    Examination:  General exam: Appears calm and comfortable  Respiratory system: Clear to auscultation Cardiovascular system: S1 & S2 heard, RRR.  Abd: nondistended, soft and nontender.Normal bowel sounds heard. Central nervous system: Alert and oriented. No focal neurological deficits. Extremities: no edema Skin: No rashes Psychiatry:  Mood & affect appropriate.     Data Reviewed:   CBC: Recent Labs  Lab 08/14/23 0234 08/14/23 0351 08/16/23 0324 08/20/23 0423  WBC 11.1* 10.7* 10.5 18.4*  NEUTROABS 8.3*  --   --   --   HGB 6.4* 7.5* 7.7* 8.5*  HCT 20.5* 23.9* 25.2* 28.0*  MCV 87.2 86.3 86.9 87.2  PLT 264 249 321 459*   Basic Metabolic Panel: Recent Labs  Lab 08/14/23 0234 08/16/23 0324 08/18/23 0305 08/20/23 0423  NA 147* 142 140 138  K 4.0 4.4 4.2 4.5  CL 108 104 103 102  CO2 29 28 27 25   GLUCOSE 133* 161* 107* 168*  BUN 31* 31* 28* 25*  CREATININE 1.32* 1.34* 0.96 1.01*  CALCIUM  8.2* 8.3* 8.5* 8.6*  MG 2.1  --  2.1  --    GFR: Estimated Creatinine Clearance: 49.5 mL/min (A) (by C-G formula based on SCr of 1.01 mg/dL (H)). Liver Function Tests: Recent Labs  Lab 08/16/23 0324 08/18/23 0305 08/20/23 0423  AST 101* 64* 75*  ALT 115* 81* 83*  ALKPHOS 191* 156* 189*  BILITOT 0.3 0.8 0.5  PROT 5.7* 5.8* 6.2*  ALBUMIN <1.5* <1.5* 1.7*   No results for input(s): LIPASE, AMYLASE in the last 168 hours. Recent Labs  Lab 08/16/23 0326  AMMONIA 24   Coagulation Profile: No results for input(s): INR, PROTIME in the last 168 hours. Cardiac Enzymes: No results for input(s): CKTOTAL, CKMB, CKMBINDEX, TROPONINI in the last 168 hours. BNP (last 3 results) No results for input(s): PROBNP in the last 8760 hours. HbA1C: Recent Labs    08/19/23 0252   HGBA1C 6.0*   CBG: Recent Labs  Lab 08/19/23 0843 08/19/23 1102 08/19/23 1813 08/19/23 2014 08/20/23 0756  GLUCAP 214* 240* 182* 156* 168*   Lipid Profile: No results for input(s): CHOL, HDL, LDLCALC, TRIG, CHOLHDL, LDLDIRECT in the last 72 hours. Thyroid Function Tests: No results for input(s): TSH, T4TOTAL, FREET4, T3FREE, THYROIDAB in the last 72 hours. Anemia Panel: No results for input(s): VITAMINB12, FOLATE, FERRITIN, TIBC, IRON, RETICCTPCT in the last 72 hours. Urine analysis:    Component Value Date/Time   COLORURINE YELLOW 08/01/2023 1514   APPEARANCEUR CLEAR 08/01/2023 1514   LABSPEC 1.026 08/01/2023 1514   PHURINE 6.0 08/01/2023 1514   GLUCOSEU NEGATIVE 08/01/2023 1514   HGBUR NEGATIVE 08/01/2023 1514   BILIRUBINUR NEGATIVE 08/01/2023 1514   KETONESUR 5 (A) 08/01/2023 1514   PROTEINUR NEGATIVE 08/01/2023 1514   NITRITE NEGATIVE 08/01/2023 1514   LEUKOCYTESUR NEGATIVE 08/01/2023 1514   Sepsis Labs: @LABRCNTIP (procalcitonin:4,lacticidven:4)  )No results found for this or any previous visit (from the past 240 hours).   Radiology Studies: DG CHEST PORT 1 VIEW Result Date: 08/20/2023 CLINICAL DATA:  Hypoxia EXAM: PORTABLE CHEST 1 VIEW COMPARISON:  08/07/2023 FINDINGS: Cardiopericardial silhouette is at upper limits of normal for size. Interstitial markings are diffusely coarsened with chronic features. No focal consolidation, edema, or pleural effusion. No acute bony abnormality. IMPRESSION: Chronic interstitial coarsening without acute cardiopulmonary findings. Electronically Signed   By: Camellia Candle M.D.   On: 08/20/2023 07:40   DG Swallowing Func-Speech Pathology Result Date: 08/18/2023 Table formatting from the original result was not included. Modified Barium Swallow Study Patient Details Name: Christinea Brizuela MRN: [id number] Date of Birth: [birth year] Today's Date: 08/18/2023 HPI/PMH: HPI: 63 year old female admitted from [facility] for cardiac cath 07/31/2023 without acute interventions required. MS changes post-procedure, somnolence and resp failure requiring emergent intubation 6/13-20. Dx acute metabolic encephalopathy, hyperammonemia, AKI, CAD with acute NSTEMI. Has cortrak. PMHx stroke 2021, polysubstance abuse, tobacco abuse, bipolar, thyroid disease. Clinical Impression: Clinical Impression: Pt presents with a mild oral dysphagia complicated primarily by her cognitive status, lack of initiation and encouragement required for full participation. Oral phase marked by slowed manipulation but adequate clearance of POs from oral cavity. There was intermittent high penetration of thin liquids into the laryngeal vestibule but NO aspiration over multiple trials.  There was adequate pharyngeal squeeze and clearance of materials through the pharynx. Pt declined the barium pill as well as regular solids.  Recommend initiating a dysphagia 2 diet with thin liquids; give meds whole in puree. SLP will follow. DIGEST Swallow Severity Rating*  Safety: 0  Efficiency:0  Overall Pharyngeal Swallow Severity: 0- WFL; dysphagia is primarily oral, mild 1: mild; 2: moderate; 3: severe; 4: profound *The Dynamic Imaging Grade of Swallowing Toxicity is standardized for the head and neck cancer population, however, demonstrates promising clinical applications across populations to standardize the clinical rating of pharyngeal swallow safety and severity. Factors that may increase risk of adverse event in presence of aspiration Noe & Lianne 2021): Factors that may increase risk of adverse event in presence of aspiration Noe & Lianne 2021): Limited mobility; Reduced cognitive function Recommendations/Plan: Swallowing Evaluation Recommendations Swallowing Evaluation Recommendations Recommendations: PO diet PO Diet Recommendation: Dysphagia 2 (Finely chopped); Thin liquids (Level 0) Liquid Administration via: Cup; Straw Medication Administration:  Whole meds with puree Supervision: Staff to assist with self-feeding Oral care recommendations: Oral care BID (2x/day) Treatment Plan Treatment Plan Treatment recommendations: Therapy as outlined in treatment plan below Functional status assessment: Patient has had a recent decline in their functional status and demonstrates the ability to make significant improvements in function in a reasonable and predictable amount of time. Treatment frequency: Min 2x/week Treatment duration: 1 week Interventions: Trials of upgraded texture/liquids; Patient/family education; Diet toleration management by SLP Recommendations Recommendations for follow up therapy are one component of a multi-disciplinary discharge planning process, led by the attending physician.  Recommendations may be updated based on patient status, additional functional criteria and insurance authorization. Assessment: Orofacial Exam: Orofacial Exam Oral Cavity - Dentition: Edentulous Anatomy: Anatomy: Presence of cervical hardware Boluses Administered: Boluses Administered Boluses Administered: Thin liquids (Level 0); Mildly thick liquids (Level 2, nectar thick); Puree; Solid  Oral Impairment Domain: Oral Impairment Domain Lip Closure: No labial escape Tongue control during bolus hold: Cohesive bolus between tongue to palatal seal Bolus preparation/mastication: Slow prolonged chewing/mashing with complete recollection Bolus transport/lingual motion: Slow tongue motion Oral residue: Complete oral clearance Initiation of pharyngeal swallow : Pyriform sinuses  Pharyngeal Impairment Domain: Pharyngeal Impairment Domain Soft palate elevation: No bolus between soft palate (SP)/pharyngeal wall (PW) Laryngeal elevation: Complete superior movement of thyroid cartilage with complete approximation of arytenoids to  epiglottic petiole Anterior hyoid excursion: Complete anterior movement Epiglottic movement: Complete inversion Laryngeal vestibule closure: Incomplete,  narrow column air/contrast in laryngeal vestibule Pharyngeal stripping wave : Present - complete Pharyngeal contraction (A/P view only): N/A Pharyngoesophageal segment opening: Partial distention/partial duration, partial obstruction of flow Tongue base retraction: Trace column of contrast or air between tongue base and PPW Pharyngeal residue: Trace residue within or on pharyngeal structures Location of pharyngeal residue: Valleculae; Pyriform sinuses  Esophageal Impairment Domain: No data recorded Pill: Pill Consistency administered: -- (NT - pt declined) Penetration/Aspiration Scale Score: Penetration/Aspiration Scale Score 1.  Material does not enter airway: Mildly thick liquids (Level 2, nectar thick); Puree; Solid 2.  Material enters airway, remains ABOVE vocal cords then ejected out: Thin liquids (Level 0) Compensatory Strategies: Compensatory Strategies Compensatory strategies: Yes Straw: Effective   General Information: Caregiver present: No  Diet Prior to this Study: NPO; Cortrak/Small bore NG tube   Temperature : Normal   Respiratory Status: WFL   Supplemental O2: None (Room air)   History of Recent Intubation: Yes  Behavior/Cognition: Requires cueing Self-Feeding Abilities: Dependent for feeding Baseline vocal quality/speech: Normal Volitional Cough: Able to elicit Volitional Swallow: Able to elicit Exam Limitations: Excessive movement; Poor participation Goal Planning: Prognosis for improved oropharyngeal function: Good Barriers to Reach Goals: Cognitive deficits No data recorded No data recorded Consulted and agree with results and recommendations: Pt unable/family or caregiver not available Pain: Pain Assessment Pain Assessment: Faces Faces Pain Scale: 0 End of Session: Start Time:SLP Start Time (ACUTE ONLY): 1311 Stop Time: SLP Stop Time (ACUTE ONLY): 1335 Time Calculation:SLP Time Calculation (min) (ACUTE ONLY): 24 min Charges: SLP Evaluations $ SLP Speech Visit: 1 Visit SLP Evaluations $MBS Swallow:  1 Procedure SLP visit diagnosis: SLP Visit Diagnosis: Dysphagia, oropharyngeal phase (R13.12) Past Medical History: Past Medical History: Diagnosis Date  Anxiety   Bipolar 2 disorder (HCC)   Chronic hepatitis C virus genotype 1 infection (HCC)   Chronic pancreatitis (HCC)   CKD (chronic kidney disease) stage 4, GFR 15-29 ml/min (HCC)   COPD (chronic obstructive pulmonary disease) (HCC)   Coronary artery disease 11/2019  s/p PTCI  LCx & RCA. Complicated by Vfib arrest  DM (diabetes mellitus), type 2 (HCC)   HFrEF (heart failure with reduced ejection fraction) (HCC) 08/01/2023  EF 45-50%  Hyperparathyroidism, secondary (HCC)   Hypothyroid   Lithium toxicity   Marijuana abuse   NSTEMI (non-ST elevated myocardial infarction) (HCC) 07/30/2023  med management  Peripheral autonomic neuropathy due to DM [facility])   STEMI (ST elevation myocardial infarction) (HCC) 2021  Stroke [facility])   right hemiparesis  Substance abuse (HCC)   Tardive dyskinesia   Tobacco abuse  Past Surgical History: Past Surgical History: Procedure Laterality Date  CARDIAC CATHETERIZATION    CHOLECYSTECTOMY    LEFT HEART CATH AND CORONARY ANGIOGRAPHY N/A 07/31/2023  Patent LCx & RCA stents. Chronic occlussion mLAD with collaterals=med management Vona Palma Laurice 08/18/2023, 2:10 PM Amanda L. Couture, MA CCC/SLP Clinical Specialist - Acute Care SLP Acute Rehabilitation Services Office number [phone]    Scheduled Meds:  arformoterol   15 mcg Nebulization BID   aspirin   81 mg Oral Daily   bethanechol   25 mg Oral TID   Chlorhexidine  Gluconate Cloth  6 each Topical Daily   docusate  100 mg Oral BID   enoxaparin (LOVENOX) injection  40 mg Subcutaneous QHS   feeding supplement  237 mL Oral BID BM   folic acid   1 mg Oral Daily   insulin  aspart  0-9 Units Subcutaneous TID WC   insulin  aspart  3 Units Subcutaneous TID WC   insulin  glargine-yfgn  8 Units Subcutaneous Daily   lactulose   30 g Oral Q48H   levOCARNitine   500 mg Oral BID    levothyroxine   100 mcg Oral q morning   multivitamin with minerals  1 tablet Oral Daily   mouth rinse  15 mL Mouth Rinse Q4H   pyridOXINE   100 mg Oral Daily   revefenacin   175 mcg Nebulization Daily   thiamine   100 mg Oral Daily   zinc  sulfate (50mg  elemental zinc )  220 mg Oral Daily   Continuous Infusions:   LOS: 21 days    Time spent:    Sigurd Pac, MD [facility]   08/20/2023, 11:38 AM

## 2023-08-21 DIAGNOSIS — G9341 Metabolic encephalopathy: Secondary | ICD-10-CM | POA: Diagnosis not present

## 2023-08-21 LAB — CBC
HCT: 28.8 % — ABNORMAL LOW (ref 36.0–46.0)
Hemoglobin: 8.8 g/dL — ABNORMAL LOW (ref 12.0–15.0)
MCH: 26.7 pg (ref 26.0–34.0)
MCHC: 30.6 g/dL (ref 30.0–36.0)
MCV: 87.5 fL (ref 80.0–100.0)
Platelets: 458 10*3/uL — ABNORMAL HIGH (ref 150–400)
RBC: 3.29 MIL/uL — ABNORMAL LOW (ref 3.87–5.11)
RDW: 20.1 % — ABNORMAL HIGH (ref 11.5–15.5)
WBC: 14.5 10*3/uL — ABNORMAL HIGH (ref 4.0–10.5)
nRBC: 0.3 % — ABNORMAL HIGH (ref 0.0–0.2)

## 2023-08-21 LAB — BASIC METABOLIC PANEL WITH GFR
Anion gap: 11 (ref 5–15)
BUN: 25 mg/dL — ABNORMAL HIGH (ref 8–23)
CO2: 24 mmol/L (ref 22–32)
Calcium: 8.6 mg/dL — ABNORMAL LOW (ref 8.9–10.3)
Chloride: 99 mmol/L (ref 98–111)
Creatinine, Ser: 1.13 mg/dL — ABNORMAL HIGH (ref 0.44–1.00)
GFR, Estimated: 55 mL/min — ABNORMAL LOW (ref >60)
Glucose, Bld: 183 mg/dL — ABNORMAL HIGH (ref 70–99)
Potassium: 3.9 mmol/L (ref 3.5–5.1)
Sodium: 134 mmol/L — ABNORMAL LOW (ref 135–145)

## 2023-08-21 LAB — GLUCOSE, CAPILLARY
Glucose-Capillary: 185 mg/dL — ABNORMAL HIGH (ref 70–99)
Glucose-Capillary: 209 mg/dL — ABNORMAL HIGH (ref 70–99)
Glucose-Capillary: 188 mg/dL — ABNORMAL HIGH (ref 70–99)
Glucose-Capillary: 228 mg/dL — ABNORMAL HIGH (ref 70–99)

## 2023-08-21 MED ORDER — LACTULOSE 10 GM/15ML PO SOLN: 10.0000 g | ORAL | Status: DC

## 2023-08-21 MED ORDER — SODIUM CHLORIDE 0.9 % IV SOLN
1.0000 g | INTRAVENOUS | Status: DC
  Administered 2023-08-21 – 2023-08-24 (×4): 1 g via INTRAVENOUS
  Filled 2023-08-21 (×3): qty 10

## 2023-08-21 NOTE — Progress Notes (Signed)
 Nutrition Follow-up  DOCUMENTATION CODES:   Non-severe (moderate) malnutrition in context of chronic illness  INTERVENTION:   Continue current diet as ordered Ordering assistance Encourage PO intake Assist with feeding pt  Continue the following replacements: Vitamin B6 (100mg  x 14 days), Zinc  Sulfate (220mg  x 14 days), Folic Acid  (1mg  x 30 days) Continue MVI with minerals 1x/d Continue Ensure Plus High Protein po BID, each supplement provides 350 kcal and 20 grams of protein   NUTRITION DIAGNOSIS:   Moderate Malnutrition related to chronic illness as evidenced by severe muscle depletion, moderate fat depletion. - Still applicable   GOAL:   Patient will meet greater than or equal to 90% of their needs - Progressing   MONITOR:   TF tolerance, Skin, I & O's, Vent status, Labs  REASON FOR ASSESSMENT:   Consult Enteral/tube feeding initiation and management  ASSESSMENT:   Pt with hx of CKD3, COPD, CAD, hx CVA, and DM type 2 presented to ED from [facility] for cardiac cath.  6/11 - admitted 6/13 - intubated 6/20 - extubated 6/26, 6/27 - BSE, NPO 6/30 - MBS, DYS2/thin liquids 7/1 - Cortrak removed    Pt continues with ongoing AMS this admission. Did not speak to RD on visit, pt did track RD with eyes. Breakfast tray at bedside. Pt had minimal amounts of eggs and sausage but did well with liquids on her tray. Had 75% of pineapples, 50% orange juice, 100% whole milk, and 100% of an Ensure. Meals documented show since cortrak removal pt has been averaging 20-45% of her meals. Per RN drinks at least 2 Ensures per day.   Admit weight: 55.1 kg  Current weight: 54 kg   Average Meal Intake: 7/2-7/2: 20-45%% intake x 5 recorded meals  Intake/Output Summary (Last 24 hours) at 08/21/2023 1121 Last data filed at 08/20/2023 2239 Gross per 24 hour  Intake 760 ml  Output 1385 ml  Net -625 ml   Drains/Lines: FMS UOP x 24 hours  Nutritionally Relevant  Medications: Scheduled Meds:  feeding supplement  237 mL Oral BID BM   folic acid   1 mg Oral Daily   insulin  aspart  0-9 Units Subcutaneous TID WC   insulin  aspart  3 Units Subcutaneous TID WC   insulin  glargine-yfgn  8 Units Subcutaneous Daily   lactulose   30 g Oral Q48H   levothyroxine   100 mcg Oral q morning   multivitamin with minerals  1 tablet Oral Daily   mouth rinse  15 mL Mouth Rinse Q4H   pyridOXINE   100 mg Oral Daily   revefenacin   175 mcg Nebulization Daily   thiamine   100 mg Oral Daily   Continuous Infusions:  cefTRIAXone  (ROCEPHIN )  IV 1 g (08/21/23 0958)   Labs Reviewed: Sodium 134 BUN 25 Creatinine 1.13 Calcium  8.6 AP 189 Albumin 1.7 AST 75 ALT 83 Total protein 6.2 GFR 55 Vitamin B6 1.9 Zinc  42 CBG ranges from 122-185 mg/dL over the last 24 hours HgbA1c 6  Diet Order:   Diet Order             DIET DYS 2 Room service appropriate? Yes with Assist; Fluid consistency: Thin  Diet effective now                   EDUCATION NEEDS:   Not appropriate for education at this time  Skin:  Skin Assessment: Skin Integrity Issues: Skin Integrity Issues:: DTI DTI: Sacrum  Last BM:  7/2 - via FMS  Height:  Ht Readings from Last 1 Encounters:  07/30/23 5' 5 (1.651 m)    Weight:   Wt Readings from Last 1 Encounters:  08/21/23 (P) 54 kg    Ideal Body Weight:  56.8 kg  BMI:  Body mass index is 19.81 kg/m (pended).  Estimated Nutritional Needs:   Kcal:  1500-1700 kcal/d  Protein:  75-90g/d  Fluid:  >1.5L/d   Olivia Kenning, RD Registered Dietitian  See Amion for more information

## 2023-08-21 NOTE — Progress Notes (Signed)
 Assumed care at 0700, patient lying in bed with eyes closed, even unlabored respirations, skin warm and dry to the touch, call light placed within reach, safety measures are in place.

## 2023-08-21 NOTE — Progress Notes (Addendum)
 PROGRESS NOTE    Sarah Davis  FMW:[id number] DOB: 01/16/1961 DOA: 07/30/2023 PCP: Patient, No Pcp Per  Sarah Davis was transferred from [facility] to [facility] for cardiac catheterization.  62/F w history of bipolar, CKD, COPD, coronary artery disease, history of CVA, neuropathy and hypothyroidism, who was admitted to [facility] 07/28/23 with the working diagnosis of acute metabolic encephalopathy due to hypoglycemia.Treated with D10 drip.  She was found to have NSTEMI, prompting her transfer.  -Underwent cardiac catheterization 07/31/2023 noted to have chronic occlusion of mid LAD, medical management recommended. Post procedure she had worsening mentation, and  acute respiratory failure. Required intubation emergently. She transferred to the intensive care unit.  6/12 - CT negative for LVO, MRI negative for acute findings, advanced chronic microvascular ischemia,  no seizures on EEG.  6/14 remains somnolent.  6/16 unresponsive, ammonia back in 250 range; started carnitine, increased lactulose  6/17 ammonia back in 70s, still minimally responsive, remaining off sedation 6/21 LFTs elevated 6/23 no improvement in mental status, MRI ok, carnitine stopped as rising LFTs, possible DILI 6/24 spoke 1-2 words 6/25 speaking in phrases 6/26 held brief conversation -6/27, transferred from ICU to [facility] service   -6/28 slowly improving mentation, tolerating well tube feedings.  -6/29 mentation continues to improve, per her family at the bedside she is not yet back to her baseline.  -6/30 advanced diet, with improvement in swallow dysfunction.  -7/01 Cortrak removed.  -7/2: WBC upto 18K, foley removed, UA suggestive of infection   Subjective: Feels better overall, foul-smelling urine, urinary retention after catheter removal, required I/O cath early this morning   Assessment and Plan:  Acute metabolic encephalopathy Multifactorial, likely related to hyperammonemia, severe hypoglycemia on original  admission.  Liver US  really unremarkable but low quality -Clinically she has been slowly improving.  -Aspiration precautions, increase activity, PT OT Carnitine level > 400, will discontinue Levocarnitine   - In terms of hyperammonemia, she may have fatty liver disease from NASH or related to psychotropic meds, lactulose  dose decreased for diarrhea - Cortrak removed, diet as tolerated  Leukocytosis Catheter related UTI - Had urinary catheter placed for retention on 6/29, noted to have some dysuria, foul smelling urine and worsening leukocytosis yesterday, UA abnormal, follow-up urine culture -Start ceftriaxone  Monitor for recurrent retention  Acute on chronic systolic and diastolic CHF Echocardiogram with mild reduction in systolic function 45 to 50%, mild LVH, RV systolic function preserved, small pericardial effusion, mild aortic stenosis   LV hypokinetic apical septal segment, apical anterior segment, apical inferior segment, and apex.  - Diuresed with IV Lasix  earlier this admission, now appears euvolemic, GDMT limited by hypotension -add lasix  PRN  Coronary artery disease NSTEMI with chronic occlusion of LAD.  Continue medical therapy with aspirin .   CKD stage 3a, GFR 45-59 ml/min (HCC) AKI, hypernatremia,, hypokalemia.  - AKI has resolved  Malnutrition of moderate degree Continue nutritional supplements.   Hypothyroidism Continue levothyroxine    Chronic anemia Stable, monitor  Type 2 diabetes mellitus (HCC) Continue glucose cover and monitoring with insulin  sliding scale.  Basal and short acting pre meal  DVT prophylaxis: Lovenox Code Status: Full code Family Communication: None present, called and updated sister Sarah Davis Disposition Plan: Uncertain at this time, difficulties with SNF placement  Consultants:    Procedures:   Antimicrobials:    Objective: Vitals:   08/21/23 0751 08/21/23 0821 08/21/23 0827 08/21/23 0828  BP: 123/87 98/67    Pulse: 76 88 81    Resp: 19 19 (!) 22   Temp:  98 F (36.7 C) 98.6 F (37 C)    TempSrc:      SpO2: 99% 100% 99% 99%  Weight:      Height:        Intake/Output Summary (Last 24 hours) at 08/21/2023 1023 Last data filed at 08/20/2023 2239 Gross per 24 hour  Intake 760 ml  Output 1385 ml  Net -625 ml   Filed Weights   08/20/23 0403 08/20/23 0409 08/21/23 0442  Weight: 55 kg 55 kg (P) 54 kg    Examination:  General exam: Appears calm and comfortable, chronically ill, oriented to self and place only, cognitive deficits Respiratory system: Decreased breath sounds at the bases Cardiovascular system: S1 & S2 heard, RRR.  Abd: nondistended, soft and nontender.Normal bowel sounds heard. Central nervous system: Alert and oriented. No focal neurological deficits. Extremities: no edema Skin: No rashes Psychiatry: Flat affect    Data Reviewed:   CBC: Recent Labs  Lab 08/16/23 0324 08/20/23 0423 08/21/23 0901  WBC 10.5 18.4* 14.5*  HGB 7.7* 8.5* 8.8*  HCT 25.2* 28.0* 28.8*  MCV 86.9 87.2 87.5  PLT 321 459* 458*   Basic Metabolic Panel: Recent Labs  Lab 08/16/23 0324 08/18/23 0305 08/20/23 0423 08/21/23 0901  NA 142 140 138 134*  K 4.4 4.2 4.5 3.9  CL 104 103 102 99  CO2 28 27 25 24   GLUCOSE 161* 107* 168* 183*  BUN 31* 28* 25* 25*  CREATININE 1.34* 0.96 1.01* 1.13*  CALCIUM  8.3* 8.5* 8.6* 8.6*  MG  --  2.1  --   --    GFR: Estimated Creatinine Clearance: 44.2 mL/min (A) (by C-G formula based on SCr of 1.13 mg/dL (H)). Liver Function Tests: Recent Labs  Lab 08/16/23 0324 08/18/23 0305 08/20/23 0423  AST 101* 64* 75*  ALT 115* 81* 83*  ALKPHOS 191* 156* 189*  BILITOT 0.3 0.8 0.5  PROT 5.7* 5.8* 6.2*  ALBUMIN <1.5* <1.5* 1.7*   No results for input(s): LIPASE, AMYLASE in the last 168 hours. Recent Labs  Lab 08/16/23 0326  AMMONIA 24   Coagulation Profile: No results for input(s): INR, PROTIME in the last 168 hours. Cardiac Enzymes: No results for input(s):  CKTOTAL, CKMB, CKMBINDEX, TROPONINI in the last 168 hours. BNP (last 3 results) No results for input(s): PROBNP in the last 8760 hours. HbA1C: Recent Labs    08/19/23 0252  HGBA1C 6.0*   CBG: Recent Labs  Lab 08/20/23 0756 08/20/23 1236 08/20/23 1557 08/20/23 2006 08/21/23 0821  GLUCAP 168* 202* 195* 122* 185*   Lipid Profile: No results for input(s): CHOL, HDL, LDLCALC, TRIG, CHOLHDL, LDLDIRECT in the last 72 hours. Thyroid Function Tests: No results for input(s): TSH, T4TOTAL, FREET4, T3FREE, THYROIDAB in the last 72 hours. Anemia Panel: No results for input(s): VITAMINB12, FOLATE, FERRITIN, TIBC, IRON, RETICCTPCT in the last 72 hours. Urine analysis:    Component Value Date/Time   COLORURINE YELLOW 08/20/2023 2200   APPEARANCEUR HAZY (A) 08/20/2023 2200   LABSPEC 1.005 08/20/2023 2200   PHURINE 7.0 08/20/2023 2200   GLUCOSEU NEGATIVE 08/20/2023 2200   HGBUR NEGATIVE 08/20/2023 2200   BILIRUBINUR NEGATIVE 08/20/2023 2200   KETONESUR NEGATIVE 08/20/2023 2200   PROTEINUR NEGATIVE 08/20/2023 2200   NITRITE POSITIVE (A) 08/20/2023 2200   LEUKOCYTESUR MODERATE (A) 08/20/2023 2200   Sepsis Labs: @LABRCNTIP (procalcitonin:4,lacticidven:4)  )No results found for this or any previous visit (from the past 240 hours).   Radiology Studies: DG CHEST PORT 1 VIEW Result Date: 08/20/2023 CLINICAL DATA:  Hypoxia EXAM: PORTABLE CHEST 1 VIEW COMPARISON:  08/07/2023 FINDINGS: Cardiopericardial silhouette is at upper limits of normal for size. Interstitial markings are diffusely coarsened with chronic features. No focal consolidation, edema, or pleural effusion. No acute bony abnormality. IMPRESSION: Chronic interstitial coarsening without acute cardiopulmonary findings. Electronically Signed   By: Camellia Candle M.D.   On: 08/20/2023 07:40     Scheduled Meds:  arformoterol   15 mcg Nebulization BID   aspirin   81 mg Oral Daily   bethanechol    25 mg Oral TID   Chlorhexidine  Gluconate Cloth  6 each Topical Daily   docusate  100 mg Oral BID   enoxaparin (LOVENOX) injection  40 mg Subcutaneous QHS   feeding supplement  237 mL Oral BID BM   folic acid   1 mg Oral Daily   insulin  aspart  0-9 Units Subcutaneous TID WC   insulin  aspart  3 Units Subcutaneous TID WC   insulin  glargine-yfgn  8 Units Subcutaneous Daily   lactulose   30 g Oral Q48H   levothyroxine   100 mcg Oral q morning   multivitamin with minerals  1 tablet Oral Daily   mouth rinse  15 mL Mouth Rinse Q4H   pyridOXINE   100 mg Oral Daily   revefenacin   175 mcg Nebulization Daily   thiamine   100 mg Oral Daily   Continuous Infusions:  cefTRIAXone  (ROCEPHIN )  IV 1 g (08/21/23 0958)     LOS: 22 days    Time spent:    Sigurd Pac, MD [facility]   08/21/2023, 10:23 AM

## 2023-08-21 NOTE — Plan of Care (Signed)
  Problem: Education: Goal: Knowledge of General Education information will improve Description: Including pain rating scale, medication(s)/side effects and non-pharmacologic comfort measures Outcome: Progressing   Problem: Education: Goal: Knowledge of General Education information will improve Description: Including pain rating scale, medication(s)/side effects and non-pharmacologic comfort measures Outcome: Progressing   Problem: Clinical Measurements: Goal: Ability to maintain clinical measurements within normal limits will improve Outcome: Progressing Goal: Will remain free from infection Outcome: Progressing Goal: Diagnostic test results will improve Outcome: Progressing Goal: Respiratory complications will improve Outcome: Progressing Goal: Cardiovascular complication will be avoided Outcome: Progressing   Problem: Health Behavior/Discharge Planning: Goal: Ability to manage health-related needs will improve Outcome: Progressing

## 2023-08-21 NOTE — Progress Notes (Addendum)
 Speech Language Pathology Treatment: Dysphagia  Patient Details Name: Sarah Davis MRN: [id number] DOB: October 30, 1960 Today's Date: 08/21/2023 Time: 9093-9075 SLP Time Calculation (min) (ACUTE ONLY): 18 min  Assessment / Plan / Recommendation Clinical Impression  Pt was seen for dysphagia intervention.  Today she presents with marginally increased verbal exchange, although responses are still very delayed or at times do not occur at all.  Breakfast tray at bedside. Assisted with set-up and holding cup for drinking. She was unable to use utensils without hand-over-hand assist.   Eager to drink liquids - needed tactile/verbal cues to slow down rate of intake.  Without cues, she would drink from a straw in quick succession, leading to consistent coughing.  With tactile assist to slow down, no coughing occurred.   She accepted minimal eggs/sausage from tray then declined further solids.   Making slow progress; she is generally protecting her airway but needs assistance with feeding and cues due to her impulsivity.  She did not aspirate during her MBS. Breath sounds have remained stable since Monday's MBS and initiation of diet.  Mentation remains primary obstacle. SLP will follow.   HPI HPI: 63 year old female admitted from [facility] for cardiac cath 07/31/2023 without acute interventions required. MS changes post-procedure, somnolence and resp failure requiring emergent intubation 6/13-20. Dx acute metabolic encephalopathy, hyperammonemia, AKI, CAD with acute NSTEMI. PMHx stroke 2021, polysubstance abuse, tobacco abuse, bipolar, thyroid disease. MBS rec D2/thin      SLP Plan  Continue with current plan of care          Recommendations  Diet recommendations: Dysphagia 2 (fine chop);Thin liquid Liquids provided via: Cup;Straw Medication Administration: Whole meds with puree Supervision: Staff to assist with self feeding Compensations: Slow rate;Small sips/bites;Minimize environmental  distractions Postural Changes and/or Swallow Maneuvers: Seated upright 90 degrees                  Oral care BID;Staff/trained caregiver to provide oral care   Frequent or constant Supervision/Assistance Dysphagia, oropharyngeal phase (R13.12)     Continue with current plan of care    Laylah Riga L. Vona, MA CCC/SLP Clinical Specialist - Acute Care SLP Acute Rehabilitation Services Office number [phone]  Vona Palma Laurice  08/21/2023, 9:28 AM

## 2023-08-22 DIAGNOSIS — G9341 Metabolic encephalopathy: Secondary | ICD-10-CM | POA: Diagnosis not present

## 2023-08-22 LAB — BASIC METABOLIC PANEL WITH GFR
Anion gap: 10 (ref 5–15)
BUN: 29 mg/dL — ABNORMAL HIGH (ref 8–23)
CO2: 24 mmol/L (ref 22–32)
Calcium: 8.7 mg/dL — ABNORMAL LOW (ref 8.9–10.3)
Chloride: 103 mmol/L (ref 98–111)
Creatinine, Ser: 0.97 mg/dL (ref 0.44–1.00)
GFR, Estimated: >60 mL/min (ref >60)
Glucose, Bld: 150 mg/dL — ABNORMAL HIGH (ref 70–99)
Potassium: 3.8 mmol/L (ref 3.5–5.1)
Sodium: 137 mmol/L (ref 135–145)

## 2023-08-22 LAB — GLUCOSE, CAPILLARY
Glucose-Capillary: 169 mg/dL — ABNORMAL HIGH (ref 70–99)
Glucose-Capillary: 302 mg/dL — ABNORMAL HIGH (ref 70–99)
Glucose-Capillary: 207 mg/dL — ABNORMAL HIGH (ref 70–99)

## 2023-08-22 LAB — AMMONIA: Ammonia: 46 umol/L — ABNORMAL HIGH (ref 9–35)

## 2023-08-22 MED ORDER — BETHANECHOL CHLORIDE 5 MG PO TABS
5.0000 mg | ORAL_TABLET | Freq: Three times a day (TID) | ORAL | Status: AC
  Administered 2023-08-22 (×2): 5 mg via ORAL
  Filled 2023-08-22 (×2): qty 1

## 2023-08-22 MED ORDER — LACTULOSE 10 GM/15ML PO SOLN
10.0000 g | Freq: Two times a day (BID) | ORAL | Status: DC
  Administered 2023-08-22 – 2023-09-01 (×21): 10 g via ORAL
  Filled 2023-08-22 (×21): qty 15

## 2023-08-22 NOTE — Progress Notes (Signed)
 Occupational Therapy Treatment Patient Details Name: Sarah Davis MRN: [id number] DOB: June 12, 1960 Today's Date: 08/22/2023   History of present illness 63 yo female admitted 07/30/23. 6/12 cardiac cath with somnolence and respiratory failure post procedure requiring intubation 6/13-6/20. NSTEMI, encephalopathy and AKI impacting course. PMhx: polysubstance abuse, bipolar disorder, thyroid disease, hep C, COPD, DM, HFrEF, CVA with Rt hemiparesis   OT comments  Patient demonstrating good gains this treatment session with progressing OOB with use of Stedy and max assist +2. Patient demonstrating gains with grooming and self feeding while seated up in recliner. Patient's family present and educated on allowing patient to participate in self feeding and grooming tasks. Patient will benefit from continued inpatient follow up therapy, <3 hours/day.  Acute OT to continue to follow to address established goals to facilitate DC to next venue of care.        If plan is discharge home, recommend the following:  A lot of help with walking and/or transfers;Two people to help with walking and/or transfers;A lot of help with bathing/dressing/bathroom;Two people to help with bathing/dressing/bathroom   Equipment Recommendations  Other (comment) (TBD)    Recommendations for Other Services      Precautions / Restrictions Precautions Precautions: Fall;Other (comment) Recall of Precautions/Restrictions: Impaired Restrictions Weight Bearing Restrictions Per Provider Order: No       Mobility Bed Mobility Overal bed mobility: Needs Assistance Bed Mobility: Rolling, Sidelying to Sit Rolling: Max assist Sidelying to sit: Mod assist, Max assist, +2 for physical assistance       General bed mobility comments: limited participation from patient on raising trunk    Transfers Overall transfer level: Needs assistance Equipment used: Ambulation equipment used Transfers: Sit to/from Stand, Bed to  chair/wheelchair/BSC Sit to Stand: Max assist, +2 physical assistance           General transfer comment: bed pad utilized to assist with hips and patient assisting with BUEs Transfer via Lift Equipment: Stedy   Balance Overall balance assessment: Needs assistance Sitting-balance support: Feet supported, Bilateral upper extremity supported Sitting balance-Leahy Scale: Poor Sitting balance - Comments: max assist initially but progressed to CGA/min assist with patient using Stedy for support Postural control: Posterior lean Standing balance support: Bilateral upper extremity supported Standing balance-Leahy Scale: Zero Standing balance comment: reliant on therapist and Stedy for support                           ADL either performed or assessed with clinical judgement   ADL Overall ADL's : Needs assistance/impaired Eating/Feeding: Minimal assistance;Sitting Eating/Feeding Details (indicate cue type and reason): verbal cues to stay on task and occasional min assist Grooming: Wash/dry hands;Wash/dry face;Supervision/safety;Sitting Grooming Details (indicate cue type and reason): cues to initiate                               General ADL Comments: family educated on assistance to provide to allow patient to feed self    Extremity/Trunk Assessment              Vision       Perception     Praxis     Communication Communication Communication: Impaired Factors Affecting Communication: Difficulty expressing self   Cognition Arousal: Alert Behavior During Therapy: Flat affect Cognition: Difficult to assess Difficult to assess due to: Impaired communication           OT - Cognition Comments: more vocal  on this date                 Following commands: Impaired Following commands impaired: Follows one step commands inconsistently, Follows one step commands with increased time      Cueing   Cueing Techniques: Verbal cues, Gestural  cues, Tactile cues  Exercises      Shoulder Instructions       General Comments mother in the room and supportive    Pertinent Vitals/ Pain       Pain Assessment Pain Assessment: No/denies pain Pain Intervention(s): Monitored during session  Home Living                                          Prior Functioning/Environment              Frequency  Min 2X/week        Progress Toward Goals  OT Goals(current goals can now be found in the care plan section)  Progress towards OT goals: Progressing toward goals  Acute Rehab OT Goals Patient Stated Goal: for patient to get better OT Goal Formulation: With family Time For Goal Achievement: 08/29/23 Potential to Achieve Goals: Fair ADL Goals Pt Will Perform Grooming: with mod assist;bed level Pt Will Perform Upper Body Bathing: with mod assist;sitting Pt/caregiver will Perform Home Exercise Program: Increased strength;Both right and left upper extremity;With minimal assist;With written HEP provided Additional ADL Goal #1: Pt to follow one step commands > 75% of the time during functional tasks Additional ADL Goal #2: Pt to maintain sitting balance unsupported with no more than Min A during functional tasks  Plan      Co-evaluation    PT/OT/SLP Co-Evaluation/Treatment: Yes Reason for Co-Treatment: For patient/therapist safety;To address functional/ADL transfers PT goals addressed during session: Mobility/safety with mobility;Balance;Strengthening/ROM OT goals addressed during session: ADL's and self-care      AM-PAC OT 6 Clicks Daily Activity     Outcome Measure   Help from another person eating meals?: A Little Help from another person taking care of personal grooming?: A Lot Help from another person toileting, which includes using toliet, bedpan, or urinal?: Total Help from another person bathing (including washing, rinsing, drying)?: Total Help from another person to put on and taking off  regular upper body clothing?: Total Help from another person to put on and taking off regular lower body clothing?: Total 6 Click Score: 9    End of Session Equipment Utilized During Treatment: Gait belt;Other (comment) Laurent)  OT Visit Diagnosis: Other abnormalities of gait and mobility (R26.89);Muscle weakness (generalized) (M62.81);Other symptoms and signs involving cognitive function   Activity Tolerance Patient tolerated treatment well   Patient Left in chair;with call bell/phone within reach;with chair alarm set;with family/visitor present   Nurse Communication Mobility status;Need for lift equipment        Time: [phone] OT Time Calculation (min): 30 min  Charges: OT General Charges $OT Visit: 1 Visit OT Treatments $Self Care/Home Management : 8-22 mins  Dick Laine, OTA Acute Rehabilitation Services  Office [phone]   Jeb LITTIE Laine 08/22/2023, 2:44 PM

## 2023-08-22 NOTE — Progress Notes (Signed)
 Physical Therapy Treatment Patient Details Name: Ricci Paff MRN: [id number] DOB: May 14, 1960 Today's Date: 08/22/2023   History of Present Illness 63 yo female admitted 07/30/23. 6/12 cardiac cath with somnolence and respiratory failure post procedure requiring intubation 6/13-6/20. NSTEMI, encephalopathy and AKI impacting course. PMhx: polysubstance abuse, bipolar disorder, thyroid disease, hep C, COPD, DM, HFrEF, CVA with Rt hemiparesis    PT Comments  Follow-up PT visit per RN request to assist with transfer from chair to bed. Pt with decreased initiation and multiple cues required to complete task. When seated with no back support, pt fluctuated between needing MinA/CGA and MaxA for posterior lean. Able to stand and transfer with MaxAx2 and Stedy. Continue to recommend <3hrs post acute rehab to decrease caregiver burden and improve mobility. Acute PT to follow.     If plan is discharge home, recommend the following: Two people to help with walking and/or transfers;Two people to help with bathing/dressing/bathroom;Direct supervision/assist for medications management;Assistance with feeding;Assistance with cooking/housework;Direct supervision/assist for financial management;Assist for transportation;Supervision due to cognitive status   Can travel by private vehicle     No  Equipment Recommendations  Wheelchair (measurements PT);Hospital bed;Wheelchair cushion (measurements PT)       Precautions / Restrictions Precautions Precautions: Fall Recall of Precautions/Restrictions: Impaired Restrictions Weight Bearing Restrictions Per Provider Order: No     Mobility  Bed Mobility Overal bed mobility: Needs Assistance Bed Mobility: Sit to Supine     Sit to supine: Total assist, +2 for physical assistance  General bed mobility comments: TotalAx2 to return to supine using helicopter method and bed pad    Transfers Overall transfer level: Needs assistance Equipment used: Ambulation equipment  used Transfers: Sit to/from Stand, Bed to chair/wheelchair/BSC Sit to Stand: Max assist, +2 physical assistance, Via lift equipment  General transfer comment: assist needed to bring hands to stedy handle. MaxAx2 with use of bed pad and gait belt to boost-up. Transfer via Lift Equipment: Stedy     Balance Overall balance assessment: Needs assistance Sitting-balance support: Feet supported, Bilateral upper extremity supported Sitting balance-Leahy Scale: Poor Sitting balance - Comments: Fluctuated between needing MaxA for posterior lean and MinA/CGA Postural control: Posterior lean Standing balance support: Bilateral upper extremity supported Standing balance-Leahy Scale: Poor Standing balance comment: reliant on therapist and Stedy for support      Communication Communication Communication: Impaired Factors Affecting Communication: Difficulty expressing self  Cognition Arousal: Alert Behavior During Therapy: Flat affect   PT - Cognitive impairments: Attention, Initiation, Problem solving    PT - Cognition Comments: Slow to process commands with decreased verbalizations. Decreased initiation Following commands: Impaired Following commands impaired: Follows one step commands inconsistently, Follows one step commands with increased time    Cueing Cueing Techniques: Verbal cues, Gestural cues, Tactile cues  Exercises General Exercises - Lower Extremity Heel Slides: AAROM, Both, 5 reps, Supine Hip ABduction/ADduction: AAROM, Both, 5 reps, Supine    General Comments General comments (skin integrity, edema, etc.): mother in the room and supportive      Pertinent Vitals/Pain Pain Assessment Pain Assessment: No/denies pain     PT Goals (current goals can now be found in the care plan section) Acute Rehab PT Goals PT Goal Formulation: Patient unable to participate in goal setting Time For Goal Achievement: 08/29/23 Potential to Achieve Goals: Fair Progress towards PT goals:  Progressing toward goals    Frequency    Min 2X/week              AM-PAC PT 6 Clicks Mobility  Outcome Measure  Help needed turning from your back to your side while in a flat bed without using bedrails?: Total Help needed moving from lying on your back to sitting on the side of a flat bed without using bedrails?: Total Help needed moving to and from a bed to a chair (including a wheelchair)?: Total Help needed standing up from a chair using your arms (e.g., wheelchair or bedside chair)?: Total Help needed to walk in hospital room?: Total Help needed climbing 3-5 steps with a railing? : Total 6 Click Score: 6    End of Session Equipment Utilized During Treatment: Gait belt Activity Tolerance: Patient tolerated treatment well Patient left: in bed;with call bell/phone within reach;with SCD's reapplied Nurse Communication: Mobility status;Need for lift equipment PT Visit Diagnosis: Other abnormalities of gait and mobility (R26.89);Difficulty in walking, not elsewhere classified (R26.2);Other symptoms and signs involving the nervous system (R29.898)     Time: 8444-8387 PT Time Calculation (min) (ACUTE ONLY): 17 min  Charges:    $Therapeutic Activity: 8-22 mins PT General Charges $$ ACUTE PT VISIT: 1 Visit                     Kate ORN, PT, DPT Secure Chat Preferred  Rehab Office [phone]    Kate BRAVO Wendolyn 08/22/2023, 4:35 PM

## 2023-08-22 NOTE — Plan of Care (Signed)
 Problem: Education: Goal: Knowledge of General Education information will improve Description: Including pain rating scale, medication(s)/side effects and non-pharmacologic comfort measures Outcome: Progressing   Problem: Health Behavior/Discharge Planning: Goal: Ability to manage health-related needs will improve Outcome: Progressing   Problem: Clinical Measurements: Goal: Ability to maintain clinical measurements within normal limits will improve Outcome: Progressing Goal: Will remain free from infection Outcome: Progressing Goal: Diagnostic test results will improve Outcome: Progressing Goal: Respiratory complications will improve Outcome: Progressing Goal: Cardiovascular complication will be avoided Outcome: Progressing   Problem: Activity: Goal: Risk for activity intolerance will decrease Outcome: Progressing   Problem: Nutrition: Goal: Adequate nutrition will be maintained Outcome: Progressing   Problem: Coping: Goal: Level of anxiety will decrease Outcome: Progressing   Problem: Elimination: Goal: Will not experience complications related to bowel motility Outcome: Progressing Goal: Will not experience complications related to urinary retention Outcome: Progressing   Problem: Pain Managment: Goal: General experience of comfort will improve and/or be controlled Outcome: Progressing   Problem: Safety: Goal: Ability to remain free from injury will improve Outcome: Progressing   Problem: Skin Integrity: Goal: Risk for impaired skin integrity will decrease Outcome: Progressing   Problem: Cardiac: Goal: Ability to achieve and maintain adequate cardiovascular perfusion will improve Outcome: Progressing   Problem: Education: Goal: Ability to describe self-care measures that may prevent or decrease complications (Diabetes Survival Skills Education) will improve Outcome: Progressing Goal: Individualized Educational Video(s) Outcome: Progressing   Problem:  Coping: Goal: Ability to adjust to condition or change in health will improve Outcome: Progressing   Problem: Fluid Volume: Goal: Ability to maintain a balanced intake and output will improve Outcome: Progressing   Problem: Health Behavior/Discharge Planning: Goal: Ability to identify and utilize available resources and services will improve Outcome: Progressing Goal: Ability to manage health-related needs will improve Outcome: Progressing   Problem: Metabolic: Goal: Ability to maintain appropriate glucose levels will improve Outcome: Progressing   Problem: Nutritional: Goal: Maintenance of adequate nutrition will improve Outcome: Progressing Goal: Progress toward achieving an optimal weight will improve Outcome: Progressing   Problem: Skin Integrity: Goal: Risk for impaired skin integrity will decrease Outcome: Progressing   Problem: Tissue Perfusion: Goal: Adequacy of tissue perfusion will improve Outcome: Progressing   Problem: Consults Goal: Venous Thromboembolism Patient Education Description: See Patient Education Module for education specifics. Outcome: Progressing Goal: Diagnosis - Venous Thromboembolism (VTE) Description: Choose a selection Outcome: Progressing Goal: Pharmacy Consult for anticoagulation Outcome: Progressing Goal: Skin Care Protocol Initiated - if Braden Score 18 or less Description: If consults are not indicated, leave blank or document N/A Outcome: Progressing Goal: Nutrition Consult-if indicated Outcome: Progressing Goal: Diabetes Guidelines if Diabetic/Glucose > 140 Description: If diabetic or lab glucose is > 140 mg/dl - Initiate Diabetes/Hyperglycemia Guidelines & Document Interventions  Outcome: Progressing   Problem: Phase I Progression Outcomes Goal: Pain controlled with appropriate interventions Outcome: Progressing Goal: Dyspnea controlled at rest (PE) Outcome: Progressing Goal: Tolerating diet Outcome: Progressing Goal:  Initial discharge plan identified Outcome: Progressing Goal: Voiding-avoid urinary catheter unless indicated Outcome: Progressing Goal: Hemodynamically stable Outcome: Progressing Goal: Other Phase I Outcomes/Goals Outcome: Progressing   Problem: Phase II Progression Outcomes Goal: Therapeutic drug levels for anticoagulation Outcome: Progressing Goal: 02 sats trending upward/stable (PE) Outcome: Progressing Goal: Discharge plan established Outcome: Progressing Goal: Tolerating diet Outcome: Progressing Goal: Other Phase II Outcomes/Goals Outcome: Progressing   Problem: Phase III Progression Outcomes Goal: 02 sats stabilized Outcome: Progressing Goal: Activity at appropriate level-compared to baseline Description: (UP IN CHAIR FOR HEMODIALYSIS) Outcome:  Progressing Goal: Discharge plan remains appropriate-arrangements made Outcome: Progressing Goal: Other Phase III Outcomes/Goals Outcome: Progressing   Problem: Discharge Progression Outcomes Goal: Barriers To Progression Addressed/Resolved Outcome: Progressing Goal: Discharge plan in place and appropriate Outcome: Progressing Goal: Pain controlled with appropriate interventions Outcome: Progressing Goal: Hemodynamically stable Outcome: Progressing Goal: Complications resolved/controlled Outcome: Progressing Goal: Tolerating diet Outcome: Progressing Goal: Activity appropriate for discharge plan Outcome: Progressing Goal: Other Discharge Outcomes/Goals Outcome: Progressing

## 2023-08-22 NOTE — Progress Notes (Signed)
 PROGRESS NOTE    Sarah Davis  FMW:[id number] DOB: 28-Dec-1960 DOA: 07/30/2023 PCP: Patient, No Pcp Per  Sarah Davis was transferred from [facility] to [facility] for cardiac catheterization.  62/F w history of bipolar, CKD, COPD, coronary artery disease, history of CVA, neuropathy and hypothyroidism, who was admitted to [facility] 07/28/23 with the working diagnosis of acute metabolic encephalopathy due to hypoglycemia.Treated with D10 drip.  She was found to have NSTEMI, prompting her transfer.  -Underwent cardiac catheterization 07/31/2023 noted to have chronic occlusion of mid LAD, medical management recommended. Post procedure she had worsening mentation, and  acute respiratory failure. Required intubation emergently. She transferred to the intensive care unit.  6/12 - CT negative for LVO, MRI negative for acute findings, advanced chronic microvascular ischemia,  no seizures on EEG.  6/14 remains somnolent.  6/16 unresponsive, ammonia back in 250 range; started carnitine, increased lactulose  6/17 ammonia back in 70s, still minimally responsive, remaining off sedation 6/21 LFTs elevated 6/23 no improvement in mental status, MRI ok, carnitine stopped as rising LFTs, possible DILI 6/24 spoke 1-2 words 6/25 speaking in phrases 6/26 held brief conversation -6/27, transferred from ICU to [facility] service   -6/28 slowly improving mentation, tolerating well tube feedings.  -6/29 mentation continues to improve, per her family at the bedside she is not yet back to her baseline.  -6/30 advanced diet, with improvement in swallow dysfunction.  -7/01 Cortrak removed.  -7/2: WBC upto 18K, foley removed, UA suggestive of infection> started antibiotics - 7/4, recurrent retention, Foley replaced   Subjective: Feels better overall, foul-smelling urine, urinary retention after catheter removal, required I/O cath early this morning   Assessment and Plan:  Acute metabolic encephalopathy Multifactorial,  likely related to hyperammonemia, severe hypoglycemia on original admission.  Liver US  really unremarkable but low quality -Clinically she has been slowly improving.  -Aspiration precautions, increase activity, PT OT Carnitine level > 400, will discontinue Levocarnitine   - In terms of hyperammonemia, she may have fatty liver disease from NASH or related to psychotropic meds, lactulose  dose decreased for diarrhea, increase again with ammonia starting to trend up a little - Cortrak removed, diet as tolerated  Leukocytosis Catheter related UTI Urinary retention - Had urinary catheter placed for retention on 6/29, noted to have some dysuria, foul smelling urine and worsening leukocytosis 7/2, UA abnormal, follow-up urine culture - Continue IV ceftriaxone , follow-up urine culture -Unfortunately had recurrent retention overnight, Foley replaced 7/3  Acute on chronic systolic and diastolic CHF Echocardiogram with mild reduction in systolic function 45 to 50%, mild LVH, RV systolic function preserved, small pericardial effusion, mild aortic stenosis   LV hypokinetic apical septal segment, apical anterior segment, apical inferior segment, and apex.  - Diuresed with IV Lasix  earlier this admission, now appears euvolemic, GDMT limited by hypotension -add lasix  PRN  Coronary artery disease NSTEMI with chronic occlusion of LAD.  Continue medical therapy with aspirin .   CKD stage 3a, GFR 45-59 ml/min (HCC) AKI, hypernatremia,, hypokalemia.  - AKI has resolved  Malnutrition of moderate degree Continue nutritional supplements.   Hypothyroidism Continue levothyroxine    Chronic anemia Stable, monitor  Type 2 diabetes mellitus (HCC) Continue glucose cover and monitoring with insulin  sliding scale.  Basal and short acting pre meal  DVT prophylaxis: Lovenox  Code Status: Full code Family Communication: None present, called and updated sister Sarah Davis yesterday Disposition Plan: Uncertain at this  time, difficulties with SNF placement  Consultants:    Procedures:   Antimicrobials:    Objective: Vitals:  08/22/23 0530 08/22/23 0530 08/22/23 0748 08/22/23 0820  BP: 104/61 104/61  104/61  Pulse: 82 82  85  Resp: 14 14  15   Temp: 97.8 F (36.6 C) 97.8 F (36.6 C)  (!) 97.5 F (36.4 C)  TempSrc:    Axillary  SpO2: 98% 99% 98% 96%  Weight:      Height:        Intake/Output Summary (Last 24 hours) at 08/22/2023 1034 Last data filed at 08/22/2023 9073 Gross per 24 hour  Intake 175 ml  Output 2525 ml  Net -2350 ml   Filed Weights   08/20/23 0409 08/21/23 0442 08/22/23 0500  Weight: 55 kg (P) 54 kg 53 kg    Examination:  General exam: Chronically ill female appears much older than stated age, somnolent this morning but arousable, oriented to self and place HEENT: No JVD CVS: S1-S2, regular rhythm Lungs: Decreased breath sounds at the bases Abdomen: Soft, nontender, bowel sounds present, GU: Foley catheter Extremities: No edema Neuro: Somnolent but arousable, moves all extremities, no localizing signs  Skin: No rashes Psychiatry: Flat affect    Data Reviewed:   CBC: Recent Labs  Lab 08/16/23 0324 08/20/23 0423 08/21/23 0901  WBC 10.5 18.4* 14.5*  HGB 7.7* 8.5* 8.8*  HCT 25.2* 28.0* 28.8*  MCV 86.9 87.2 87.5  PLT 321 459* 458*   Basic Metabolic Panel: Recent Labs  Lab 08/16/23 0324 08/18/23 0305 08/20/23 0423 08/21/23 0901 08/22/23 0228  NA 142 140 138 134* 137  K 4.4 4.2 4.5 3.9 3.8  CL 104 103 102 99 103  CO2 28 27 25 24 24   GLUCOSE 161* 107* 168* 183* 150*  BUN 31* 28* 25* 25* 29*  CREATININE 1.34* 0.96 1.01* 1.13* 0.97  CALCIUM  8.3* 8.5* 8.6* 8.6* 8.7*  MG  --  2.1  --   --   --    GFR: Estimated Creatinine Clearance: 49.7 mL/min (by C-G formula based on SCr of 0.97 mg/dL). Liver Function Tests: Recent Labs  Lab 08/16/23 0324 08/18/23 0305 08/20/23 0423  AST 101* 64* 75*  ALT 115* 81* 83*  ALKPHOS 191* 156* 189*  BILITOT 0.3  0.8 0.5  PROT 5.7* 5.8* 6.2*  ALBUMIN <1.5* <1.5* 1.7*   No results for input(s): LIPASE, AMYLASE in the last 168 hours. Recent Labs  Lab 08/16/23 0326 08/22/23 0228  AMMONIA 24 46*   Coagulation Profile: No results for input(s): INR, PROTIME in the last 168 hours. Cardiac Enzymes: No results for input(s): CKTOTAL, CKMB, CKMBINDEX, TROPONINI in the last 168 hours. BNP (last 3 results) No results for input(s): PROBNP in the last 8760 hours. HbA1C: No results for input(s): HGBA1C in the last 72 hours.  CBG: Recent Labs  Lab 08/21/23 0821 08/21/23 1311 08/21/23 1609 08/21/23 2029 08/22/23 0821  GLUCAP 185* 209* 188* 228* 169*   Lipid Profile: No results for input(s): CHOL, HDL, LDLCALC, TRIG, CHOLHDL, LDLDIRECT in the last 72 hours. Thyroid Function Tests: No results for input(s): TSH, T4TOTAL, FREET4, T3FREE, THYROIDAB in the last 72 hours. Anemia Panel: No results for input(s): VITAMINB12, FOLATE, FERRITIN, TIBC, IRON, RETICCTPCT in the last 72 hours. Urine analysis:    Component Value Date/Time   COLORURINE YELLOW 08/20/2023 2200   APPEARANCEUR HAZY (A) 08/20/2023 2200   LABSPEC 1.005 08/20/2023 2200   PHURINE 7.0 08/20/2023 2200   GLUCOSEU NEGATIVE 08/20/2023 2200   HGBUR NEGATIVE 08/20/2023 2200   BILIRUBINUR NEGATIVE 08/20/2023 2200   KETONESUR NEGATIVE 08/20/2023 2200   PROTEINUR NEGATIVE  08/20/2023 2200   NITRITE POSITIVE (A) 08/20/2023 2200   LEUKOCYTESUR MODERATE (A) 08/20/2023 2200   Sepsis Labs: @LABRCNTIP (procalcitonin:4,lacticidven:4)  )No results found for this or any previous visit (from the past 240 hours).   Radiology Studies: No results found.    Scheduled Meds:  arformoterol   15 mcg Nebulization BID   aspirin   81 mg Oral Daily   bethanechol   25 mg Oral TID   Chlorhexidine  Gluconate Cloth  6 each Topical Daily   enoxaparin  (LOVENOX ) injection  40 mg Subcutaneous QHS   feeding  supplement  237 mL Oral BID BM   folic acid   1 mg Oral Daily   insulin  aspart  0-9 Units Subcutaneous TID WC   insulin  aspart  3 Units Subcutaneous TID WC   insulin  glargine-yfgn  8 Units Subcutaneous Daily   lactulose   10 g Oral BID   levothyroxine   100 mcg Oral q morning   multivitamin with minerals  1 tablet Oral Daily   mouth rinse  15 mL Mouth Rinse Q4H   pyridOXINE   100 mg Oral Daily   revefenacin   175 mcg Nebulization Daily   thiamine   100 mg Oral Daily   Continuous Infusions:  cefTRIAXone  (ROCEPHIN )  IV 1 g (08/22/23 0901)     LOS: 23 days    Time spent:    Sigurd Pac, MD [facility]   08/22/2023, 10:34 AM

## 2023-08-22 NOTE — Progress Notes (Signed)
 Physical Therapy Treatment Patient Details Name: Sarah Davis MRN: [id number] DOB: 04/30/1960 Today's Date: 08/22/2023   History of Present Illness 63 yo female admitted 07/30/23. 6/12 cardiac cath with somnolence and respiratory failure post procedure requiring intubation 6/13-6/20. NSTEMI, encephalopathy and AKI impacting course. PMhx: polysubstance abuse, bipolar disorder, thyroid disease, hep C, COPD, DM, HFrEF, CVA with Rt hemiparesis    PT Comments  Patient progressing to OOB today.  Stood well in stedy though fearful and needing cues for sitting during moving to chair.  She was slow to respond to commands for bed  mobility though using rail some and heavy lifting for side to sit.  She was improved with balance on EOB when holding rail on stedy sitting with S.  She will continue to benefit from skilled PT In the acute setting.  Continue to recommend post-acute inpatient rehab at d/c.     If plan is discharge home, recommend the following: Two people to help with walking and/or transfers;Two people to help with bathing/dressing/bathroom;Direct supervision/assist for medications management;Assistance with feeding;Assistance with cooking/housework;Direct supervision/assist for financial management;Assist for transportation;Supervision due to cognitive status   Can travel by private vehicle     No  Equipment Recommendations  Wheelchair (measurements PT);Hospital bed;Wheelchair cushion (measurements PT)    Recommendations for Other Services       Precautions / Restrictions Precautions Precautions: Fall Recall of Precautions/Restrictions: Impaired Restrictions Weight Bearing Restrictions Per Provider Order: No     Mobility  Bed Mobility Overal bed mobility: Needs Assistance Bed Mobility: Rolling, Sidelying to Sit Rolling: Max assist Sidelying to sit: Mod assist, Max assist, +2 for physical assistance       General bed mobility comments: limited participation from patient on raising  trunk    Transfers Overall transfer level: Needs assistance Equipment used: Ambulation equipment used Transfers: Sit to/from Stand, Bed to chair/wheelchair/BSC Sit to Stand: Max assist, +2 physical assistance           General transfer comment: bed pad utilized to assist with hips and patient assisting with BUEs Transfer via Lift Equipment: Stedy  Ambulation/Gait                   Stairs             Wheelchair Mobility     Tilt Bed    Modified Rankin (Stroke Patients Only)       Balance Overall balance assessment: Needs assistance   Sitting balance-Leahy Scale: Poor Sitting balance - Comments: max assist initially but progressed to CGA/min assist with patient using Stedy for support   Standing balance support: Bilateral upper extremity supported Standing balance-Leahy Scale: Poor Standing balance comment: reliant on therapist and Stedy for support                            Communication Communication Communication: Impaired Factors Affecting Communication: Difficulty expressing self  Cognition Arousal: Alert Behavior During Therapy: Flat affect   PT - Cognitive impairments: Attention, Initiation, Problem solving                       PT - Cognition Comments: slow processing, sustained attention, decreased initiation Following commands: Impaired Following commands impaired: Follows one step commands inconsistently, Follows one step commands with increased time    Cueing Cueing Techniques: Verbal cues, Gestural cues, Tactile cues  Exercises General Exercises - Lower Extremity Long Arc Quad: AAROM, Both, 5 reps, Seated  General Comments General comments (skin integrity, edema, etc.): mother in the room and supportive      Pertinent Vitals/Pain Pain Assessment Pain Assessment: No/denies pain    Home Living                          Prior Function            PT Goals (current goals can now be found  in the care plan section) Progress towards PT goals: Progressing toward goals    Frequency    Min 2X/week      PT Plan      Co-evaluation PT/OT/SLP Co-Evaluation/Treatment: Yes Reason for Co-Treatment: For patient/therapist safety;To address functional/ADL transfers PT goals addressed during session: Mobility/safety with mobility;Balance;Strengthening/ROM OT goals addressed during session: ADL's and self-care      AM-PAC PT 6 Clicks Mobility   Outcome Measure  Help needed turning from your back to your side while in a flat bed without using bedrails?: Total Help needed moving from lying on your back to sitting on the side of a flat bed without using bedrails?: Total Help needed moving to and from a bed to a chair (including a wheelchair)?: Total Help needed standing up from a chair using your arms (e.g., wheelchair or bedside chair)?: Total Help needed to walk in hospital room?: Total Help needed climbing 3-5 steps with a railing? : Total 6 Click Score: 6    End of Session Equipment Utilized During Treatment: Gait belt Activity Tolerance: Patient tolerated treatment well Patient left: in chair;with call bell/phone within reach;with chair alarm set;with family/visitor present Nurse Communication: Need for lift equipment PT Visit Diagnosis: Other abnormalities of gait and mobility (R26.89);Difficulty in walking, not elsewhere classified (R26.2);Other symptoms and signs involving the nervous system (R29.898)     Time: 1202-1221 PT Time Calculation (min) (ACUTE ONLY): 19 min  Charges:    $Therapeutic Activity: 8-22 mins PT General Charges $$ ACUTE PT VISIT: 1 Visit                     Sarah Davis, PT Acute Rehabilitation Services Office:[phone] 08/22/2023    Sarah Davis 08/22/2023, 2:11 PM

## 2023-08-22 NOTE — TOC Progression Note (Signed)
 Transition of Care [facility]) - Progression Note    Patient Details  Name: Sarah Davis MRN: [id number] Date of Birth: 1960-03-20  Transition of Care [facility]) CM/SW Contact  Isaiah Public, LCSWA Phone Number: 08/22/2023, 4:14 PM  Clinical Narrative:     PT to see patient. [name] and PT to discuss current therapy plans for admission and follow up at a later time regarding discharge plan. CSW continues to follow and assist with patients dc planning needs.    Expected Discharge Plan:  (TBD) Barriers to Discharge: Continued Medical Work up  Expected Discharge Plan and Services       Living arrangements for the past 2 months: Apartment                                       Social Determinants of Health (SDOH) Interventions SDOH Screenings   Food Insecurity: Patient Unable To Answer (08/01/2023)  Housing: Unknown (08/01/2023)  Transportation Needs: Patient Unable To Answer (08/01/2023)  Utilities: Patient Unable To Answer (08/01/2023)  Tobacco Use: High Risk (08/04/2023)    Readmission Risk Interventions     No data to display

## 2023-08-23 DIAGNOSIS — G9341 Metabolic encephalopathy: Secondary | ICD-10-CM | POA: Diagnosis not present

## 2023-08-23 LAB — BASIC METABOLIC PANEL WITH GFR
Anion gap: 10 (ref 5–15)
BUN: 27 mg/dL — ABNORMAL HIGH (ref 8–23)
CO2: 24 mmol/L (ref 22–32)
Calcium: 8.8 mg/dL — ABNORMAL LOW (ref 8.9–10.3)
Chloride: 103 mmol/L (ref 98–111)
Creatinine, Ser: 0.8 mg/dL (ref 0.44–1.00)
GFR, Estimated: >60 mL/min (ref >60)
Glucose, Bld: 170 mg/dL — ABNORMAL HIGH (ref 70–99)
Potassium: 3.8 mmol/L (ref 3.5–5.1)
Sodium: 137 mmol/L (ref 135–145)

## 2023-08-23 LAB — CBC
HCT: 28.4 % — ABNORMAL LOW (ref 36.0–46.0)
Hemoglobin: 8.7 g/dL — ABNORMAL LOW (ref 12.0–15.0)
MCH: 26.9 pg (ref 26.0–34.0)
MCHC: 30.6 g/dL (ref 30.0–36.0)
MCV: 87.7 fL (ref 80.0–100.0)
Platelets: 434 K/uL — ABNORMAL HIGH (ref 150–400)
RBC: 3.24 MIL/uL — ABNORMAL LOW (ref 3.87–5.11)
RDW: 19.9 % — ABNORMAL HIGH (ref 11.5–15.5)
WBC: 11.4 K/uL — ABNORMAL HIGH (ref 4.0–10.5)
nRBC: 0.2 % (ref 0.0–0.2)

## 2023-08-23 LAB — GLUCOSE, CAPILLARY
Glucose-Capillary: 175 mg/dL — ABNORMAL HIGH (ref 70–99)
Glucose-Capillary: 191 mg/dL — ABNORMAL HIGH (ref 70–99)
Glucose-Capillary: 134 mg/dL — ABNORMAL HIGH (ref 70–99)

## 2023-08-23 NOTE — Plan of Care (Signed)
 Problem: Education: Goal: Knowledge of General Education information will improve Description: Including pain rating scale, medication(s)/side effects and non-pharmacologic comfort measures Outcome: Progressing   Problem: Health Behavior/Discharge Planning: Goal: Ability to manage health-related needs will improve Outcome: Progressing   Problem: Clinical Measurements: Goal: Ability to maintain clinical measurements within normal limits will improve Outcome: Progressing Goal: Will remain free from infection Outcome: Progressing Goal: Diagnostic test results will improve Outcome: Progressing Goal: Respiratory complications will improve Outcome: Progressing Goal: Cardiovascular complication will be avoided Outcome: Progressing   Problem: Activity: Goal: Risk for activity intolerance will decrease Outcome: Progressing   Problem: Nutrition: Goal: Adequate nutrition will be maintained Outcome: Progressing   Problem: Coping: Goal: Level of anxiety will decrease Outcome: Progressing   Problem: Elimination: Goal: Will not experience complications related to bowel motility Outcome: Progressing Goal: Will not experience complications related to urinary retention Outcome: Progressing   Problem: Pain Managment: Goal: General experience of comfort will improve and/or be controlled Outcome: Progressing   Problem: Safety: Goal: Ability to remain free from injury will improve Outcome: Progressing   Problem: Skin Integrity: Goal: Risk for impaired skin integrity will decrease Outcome: Progressing   Problem: Cardiac: Goal: Ability to achieve and maintain adequate cardiovascular perfusion will improve Outcome: Progressing   Problem: Education: Goal: Ability to describe self-care measures that may prevent or decrease complications (Diabetes Survival Skills Education) will improve Outcome: Progressing Goal: Individualized Educational Video(s) Outcome: Progressing   Problem:  Coping: Goal: Ability to adjust to condition or change in health will improve Outcome: Progressing   Problem: Fluid Volume: Goal: Ability to maintain a balanced intake and output will improve Outcome: Progressing   Problem: Health Behavior/Discharge Planning: Goal: Ability to identify and utilize available resources and services will improve Outcome: Progressing Goal: Ability to manage health-related needs will improve Outcome: Progressing   Problem: Metabolic: Goal: Ability to maintain appropriate glucose levels will improve Outcome: Progressing   Problem: Nutritional: Goal: Maintenance of adequate nutrition will improve Outcome: Progressing Goal: Progress toward achieving an optimal weight will improve Outcome: Progressing   Problem: Skin Integrity: Goal: Risk for impaired skin integrity will decrease Outcome: Progressing   Problem: Tissue Perfusion: Goal: Adequacy of tissue perfusion will improve Outcome: Progressing   Problem: Consults Goal: Venous Thromboembolism Patient Education Description: See Patient Education Module for education specifics. Outcome: Progressing Goal: Diagnosis - Venous Thromboembolism (VTE) Description: Choose a selection Outcome: Progressing Goal: Pharmacy Consult for anticoagulation Outcome: Progressing Goal: Skin Care Protocol Initiated - if Braden Score 18 or less Description: If consults are not indicated, leave blank or document N/A Outcome: Progressing Goal: Nutrition Consult-if indicated Outcome: Progressing Goal: Diabetes Guidelines if Diabetic/Glucose > 140 Description: If diabetic or lab glucose is > 140 mg/dl - Initiate Diabetes/Hyperglycemia Guidelines & Document Interventions  Outcome: Progressing   Problem: Phase I Progression Outcomes Goal: Pain controlled with appropriate interventions Outcome: Progressing Goal: Dyspnea controlled at rest (PE) Outcome: Progressing Goal: Tolerating diet Outcome: Progressing Goal:  Initial discharge plan identified Outcome: Progressing Goal: Voiding-avoid urinary catheter unless indicated Outcome: Progressing Goal: Hemodynamically stable Outcome: Progressing Goal: Other Phase I Outcomes/Goals Outcome: Progressing   Problem: Phase II Progression Outcomes Goal: Therapeutic drug levels for anticoagulation Outcome: Progressing Goal: 02 sats trending upward/stable (PE) Outcome: Progressing Goal: Discharge plan established Outcome: Progressing Goal: Tolerating diet Outcome: Progressing Goal: Other Phase II Outcomes/Goals Outcome: Progressing   Problem: Phase III Progression Outcomes Goal: 02 sats stabilized Outcome: Progressing Goal: Activity at appropriate level-compared to baseline Description: (UP IN CHAIR FOR HEMODIALYSIS) Outcome:  Progressing Goal: Discharge plan remains appropriate-arrangements made Outcome: Progressing Goal: Other Phase III Outcomes/Goals Outcome: Progressing   Problem: Discharge Progression Outcomes Goal: Barriers To Progression Addressed/Resolved Outcome: Progressing Goal: Discharge plan in place and appropriate Outcome: Progressing Goal: Pain controlled with appropriate interventions Outcome: Progressing Goal: Hemodynamically stable Outcome: Progressing Goal: Complications resolved/controlled Outcome: Progressing Goal: Tolerating diet Outcome: Progressing Goal: Activity appropriate for discharge plan Outcome: Progressing Goal: Other Discharge Outcomes/Goals Outcome: Progressing

## 2023-08-23 NOTE — Progress Notes (Signed)
 PROGRESS NOTE    Sarah Davis  FMW:[id number] DOB: 07-24-1960 DOA: 07/30/2023 PCP: Patient, No Pcp Per  Sarah Davis was transferred from [facility] to [facility] for cardiac catheterization.  62/F w history of bipolar, CKD, COPD, coronary artery disease, history of CVA, neuropathy and hypothyroidism, who was admitted to [facility] 07/28/23 with the working diagnosis of acute metabolic encephalopathy due to hypoglycemia.Treated with D10 drip.  She was found to have NSTEMI, prompting her transfer.  -Underwent cardiac catheterization 07/31/2023 noted to have chronic occlusion of mid LAD, medical management recommended. Post procedure she had worsening mentation, and  acute respiratory failure. Required intubation emergently. She transferred to the intensive care unit.  6/12 - CT negative for LVO, MRI negative for acute findings, advanced chronic microvascular ischemia,  no seizures on EEG.  6/14 remains somnolent.  6/16 unresponsive, ammonia back in 250 range; started carnitine, increased lactulose  6/17 ammonia back in 70s, still minimally responsive, remaining off sedation 6/21 LFTs elevated 6/23 no improvement in mental status, MRI ok, carnitine stopped as rising LFTs, possible DILI 6/24 spoke 1-2 words 6/25 speaking in phrases 6/26 held brief conversation -6/27, transferred from ICU to [facility] service   -6/28 slowly improving mentation, tolerating well tube feedings.  -6/29 mentation continues to improve, per her family at the bedside she is not yet back to her baseline.  -6/30 advanced diet, with improvement in swallow dysfunction.  -7/01 Cortrak removed.  -7/2: WBC upto 18K, foley removed, UA suggestive of infection> started antibiotics - 7/4, recurrent retention, Foley replaced   Subjective: Okay, no events overnight, mentating a little better   Assessment and Plan:  Acute metabolic encephalopathy Multifactorial, likely related to hyperammonemia, severe hypoglycemia on original  admission.  Liver US  really unremarkable but low quality -Clinically she has been slowly improving.  -Aspiration precautions, increase activity, PT OT Carnitine level > 400, will discontinue Levocarnitine   - In terms of hyperammonemia, she may have fatty liver disease from NASH or related to psychotropic meds, lactulose  dose decreased for diarrhea, increase again with ammonia starting to trend up a little, mental status improving - Cortrak removed, diet as tolerated  Leukocytosis Catheter related UTI Urinary retention - Had urinary catheter placed for retention on 6/29, noted to have some dysuria, foul smelling urine and worsening leukocytosis 7/2, UA abnormal - Continue IV ceftriaxone , urine culture with 100,000 colonies of gram-negative rods -Unfortunately had recurrent retention overnight, Foley replaced 7/3, routine catheter care  Acute on chronic systolic and diastolic CHF Echocardiogram with mild reduction in systolic function 45 to 50%, mild LVH, RV systolic function preserved, small pericardial effusion, mild aortic stenosis   LV hypokinetic apical septal segment, apical anterior segment, apical inferior segment, and apex.  - Diuresed with IV Lasix  earlier this admission, now appears euvolemic, GDMT limited by hypotension - Monitor off diuretics for now, add PRN at DC  Coronary artery disease NSTEMI with chronic occlusion of LAD.  Continue aspirin .,  Resume statin after discharge no  CKD stage 3a, GFR 45-59 ml/min (HCC) AKI, hypernatremia,, hypokalemia.  - AKI has resolved  Malnutrition of moderate degree Continue nutritional supplements.   Hypothyroidism Continue levothyroxine    Chronic anemia Stable, monitor  Type 2 diabetes mellitus (HCC) Continue glucose cover and monitoring with insulin  sliding scale.  Basal and short acting pre meal  DVT prophylaxis: Lovenox  Code Status: Full code Family Communication: At bedside, will update daughters Disposition Plan: TOC  following, will need placement  Consultants:    Procedures:   Antimicrobials:  Objective: Vitals:   08/22/23 2006 08/23/23 0418 08/23/23 0500 08/23/23 0815  BP: (!) 104/57 (!) 98/52  (!) 94/57  Pulse: 81 74  62  Resp: 18 17  17   Temp: 98.2 F (36.8 C) 97.7 F (36.5 C)  (!) 97.2 F (36.2 C)  TempSrc: Oral Oral    SpO2: 96% 97%  99%  Weight:   53 kg   Height:        Intake/Output Summary (Last 24 hours) at 08/23/2023 1043 Last data filed at 08/23/2023 1000 Gross per 24 hour  Intake 452 ml  Output 1650 ml  Net -1198 ml   Filed Weights   08/21/23 0442 08/22/23 0500 08/23/23 0500  Weight: (P) 54 kg 53 kg 53 kg    Examination:  General exam: Chronically ill female appears much older than stated age, somnolent this morning but arousable, oriented to self and place HEENT: No JVD CVS: S1-S2, regular rhythm Lungs: Decreased breath sounds at the bases Abdomen: Soft, nontender, bowel sounds present, GU: Foley catheter Extremities: No edema Neuro: Somnolent but arousable, moves all extremities, no localizing signs  Skin: No rashes Psychiatry: Flat affect    Data Reviewed:   CBC: Recent Labs  Lab 08/20/23 0423 08/21/23 0901 08/23/23 0531  WBC 18.4* 14.5* 11.4*  HGB 8.5* 8.8* 8.7*  HCT 28.0* 28.8* 28.4*  MCV 87.2 87.5 87.7  PLT 459* 458* 434*   Basic Metabolic Panel: Recent Labs  Lab 08/18/23 0305 08/20/23 0423 08/21/23 0901 08/22/23 0228 08/23/23 0531  NA 140 138 134* 137 137  K 4.2 4.5 3.9 3.8 3.8  CL 103 102 99 103 103  CO2 27 25 24 24 24   GLUCOSE 107* 168* 183* 150* 170*  BUN 28* 25* 25* 29* 27*  CREATININE 0.96 1.01* 1.13* 0.97 0.80  CALCIUM  8.5* 8.6* 8.6* 8.7* 8.8*  MG 2.1  --   --   --   --    GFR: Estimated Creatinine Clearance: 60.2 mL/min (by C-G formula based on SCr of 0.8 mg/dL). Liver Function Tests: Recent Labs  Lab 08/18/23 0305 08/20/23 0423  AST 64* 75*  ALT 81* 83*  ALKPHOS 156* 189*  BILITOT 0.8 0.5  PROT 5.8* 6.2*   ALBUMIN <1.5* 1.7*   No results for input(s): LIPASE, AMYLASE in the last 168 hours. Recent Labs  Lab 08/22/23 0228  AMMONIA 46*   Coagulation Profile: No results for input(s): INR, PROTIME in the last 168 hours. Cardiac Enzymes: No results for input(s): CKTOTAL, CKMB, CKMBINDEX, TROPONINI in the last 168 hours. BNP (last 3 results) No results for input(s): PROBNP in the last 8760 hours. HbA1C: No results for input(s): HGBA1C in the last 72 hours.  CBG: Recent Labs  Lab 08/21/23 2029 08/22/23 0821 08/22/23 1709 08/22/23 2106 08/23/23 0809  GLUCAP 228* 169* 302* 207* 175*   Lipid Profile: No results for input(s): CHOL, HDL, LDLCALC, TRIG, CHOLHDL, LDLDIRECT in the last 72 hours. Thyroid Function Tests: No results for input(s): TSH, T4TOTAL, FREET4, T3FREE, THYROIDAB in the last 72 hours. Anemia Panel: No results for input(s): VITAMINB12, FOLATE, FERRITIN, TIBC, IRON, RETICCTPCT in the last 72 hours. Urine analysis:    Component Value Date/Time   COLORURINE YELLOW 08/20/2023 2200   APPEARANCEUR HAZY (A) 08/20/2023 2200   LABSPEC 1.005 08/20/2023 2200   PHURINE 7.0 08/20/2023 2200   GLUCOSEU NEGATIVE 08/20/2023 2200   HGBUR NEGATIVE 08/20/2023 2200   BILIRUBINUR NEGATIVE 08/20/2023 2200   KETONESUR NEGATIVE 08/20/2023 2200   PROTEINUR NEGATIVE 08/20/2023 2200  NITRITE POSITIVE (A) 08/20/2023 2200   LEUKOCYTESUR MODERATE (A) 08/20/2023 2200   Sepsis Labs: @LABRCNTIP (procalcitonin:4,lacticidven:4)  ) Recent Results (from the past 240 hours)  Urine Culture     Status: Abnormal (Preliminary result)   Collection Time: 08/21/23  7:09 AM   Specimen: Urine, Clean Catch  Result Value Ref Range Status   Specimen Description URINE, CLEAN CATCH  Final   Special Requests NONE  Final   Culture (A)  Final    >=100,000 COLONIES/mL GRAM NEGATIVE RODS SUSCEPTIBILITIES TO FOLLOW Performed at [facility], [address] [place], [place] [address]    Report Status PENDING  Incomplete     Radiology Studies: No results found.    Scheduled Meds:  arformoterol   15 mcg Nebulization BID   aspirin   81 mg Oral Daily   Chlorhexidine  Gluconate Cloth  6 each Topical Daily   enoxaparin  (LOVENOX ) injection  40 mg Subcutaneous QHS   feeding supplement  237 mL Oral BID BM   folic acid   1 mg Oral Daily   insulin  aspart  0-9 Units Subcutaneous TID WC   insulin  aspart  3 Units Subcutaneous TID WC   insulin  glargine-yfgn  8 Units Subcutaneous Daily   lactulose   10 g Oral BID   levothyroxine   100 mcg Oral q morning   multivitamin with minerals  1 tablet Oral Daily   mouth rinse  15 mL Mouth Rinse Q4H   pyridOXINE   100 mg Oral Daily   revefenacin   175 mcg Nebulization Daily   thiamine   100 mg Oral Daily   Continuous Infusions:  cefTRIAXone  (ROCEPHIN )  IV 1 g (08/23/23 1013)     LOS: 24 days    Time spent:    Sigurd Pac, MD [facility]   08/23/2023, 10:43 AM

## 2023-08-24 DIAGNOSIS — G9341 Metabolic encephalopathy: Secondary | ICD-10-CM | POA: Diagnosis not present

## 2023-08-24 LAB — CBC
HCT: 31.7 % — ABNORMAL LOW (ref 36.0–46.0)
Hemoglobin: 9.7 g/dL — ABNORMAL LOW (ref 12.0–15.0)
MCH: 26.7 pg (ref 26.0–34.0)
MCHC: 30.6 g/dL (ref 30.0–36.0)
MCV: 87.3 fL (ref 80.0–100.0)
Platelets: 431 K/uL — ABNORMAL HIGH (ref 150–400)
RBC: 3.63 MIL/uL — ABNORMAL LOW (ref 3.87–5.11)
RDW: 19.9 % — ABNORMAL HIGH (ref 11.5–15.5)
WBC: 11.2 K/uL — ABNORMAL HIGH (ref 4.0–10.5)
nRBC: 0.3 % — ABNORMAL HIGH (ref 0.0–0.2)

## 2023-08-24 LAB — BASIC METABOLIC PANEL WITH GFR
Anion gap: 9 (ref 5–15)
BUN: 22 mg/dL (ref 8–23)
CO2: 25 mmol/L (ref 22–32)
Calcium: 8.8 mg/dL — ABNORMAL LOW (ref 8.9–10.3)
Chloride: 102 mmol/L (ref 98–111)
Creatinine, Ser: 0.78 mg/dL (ref 0.44–1.00)
GFR, Estimated: >60 mL/min (ref >60)
Glucose, Bld: 190 mg/dL — ABNORMAL HIGH (ref 70–99)
Potassium: 4.3 mmol/L (ref 3.5–5.1)
Sodium: 136 mmol/L (ref 135–145)

## 2023-08-24 LAB — URINE CULTURE: Culture: >=100000 — AB

## 2023-08-24 LAB — GLUCOSE, CAPILLARY
Glucose-Capillary: 196 mg/dL — ABNORMAL HIGH (ref 70–99)
Glucose-Capillary: 359 mg/dL — ABNORMAL HIGH (ref 70–99)
Glucose-Capillary: 153 mg/dL — ABNORMAL HIGH (ref 70–99)

## 2023-08-24 LAB — AMMONIA: Ammonia: 15 umol/L (ref 9–35)

## 2023-08-24 MED ORDER — SODIUM CHLORIDE 0.9 % IV SOLN
1.0000 g | Freq: Three times a day (TID) | INTRAVENOUS | Status: AC
  Administered 2023-08-24 – 2023-08-27 (×9): 1 g via INTRAVENOUS
  Filled 2023-08-24 (×9): qty 20

## 2023-08-24 NOTE — Progress Notes (Signed)
 Pharmacy Antibiotic Note  Sarah Davis is a 63 y.o. female admitted on 07/30/2023 s/p ACS/CVA now with Kleb ESBL UTI was initally started on ceftriaxone  now   Pharmacy has been consulted for meropenem   dosing. Cr <1 crcl 71ml/min  Plan: Meropenem  1gm IV q8h  Height: 5' 5 (165.1 cm) Weight: 53.4 kg (117 lb 11.6 oz) IBW/kg (Calculated) : 57  Temp (24hrs), Avg:98 F (36.7 C), Min:97.6 F (36.4 C), Max:98.3 F (36.8 C)  Recent Labs  Lab 08/20/23 0423 08/21/23 0901 08/22/23 0228 08/23/23 0531 08/24/23 0313  WBC 18.4* 14.5*  --  11.4* 11.2*  CREATININE 1.01* 1.13* 0.97 0.80 0.78    Estimated Creatinine Clearance: 60.7 mL/min (by C-G formula based on SCr of 0.78 mg/dL).    Allergies  Allergen Reactions   Lithium Anaphylaxis   Sulfa Antibiotics Rash   Codeine Rash    Antimicrobials this admission:   Dose adjustments this admission:   Microbiology results 7/3 UCx: Kleb ESBL     Olam Chalk Pharm.D. CPP, BCPS Clinical Pharmacist [phone] 08/24/2023 11:30 AM

## 2023-08-24 NOTE — Progress Notes (Signed)
 PROGRESS NOTE    Sarah Davis  FMW:[id number] DOB: 1961/01/04 DOA: 07/30/2023 PCP: Patient, No Pcp Per  Sarah Davis was transferred from [facility] to [facility] for cardiac catheterization.  62/F w history of bipolar, CKD, COPD, coronary artery disease, history of CVA, neuropathy and hypothyroidism, who was admitted to [facility] 07/28/23 with the working diagnosis of acute metabolic encephalopathy due to hypoglycemia.Treated with D10 drip.  She was found to have NSTEMI, prompting her transfer.  -Underwent cardiac catheterization 07/31/2023 noted to have chronic occlusion of mid LAD, medical management recommended. Post procedure she had worsening mentation, and  acute respiratory failure. Required intubation emergently. She transferred to the intensive care unit.  6/12 - CT negative for LVO, MRI negative for acute findings, advanced chronic microvascular ischemia,  no seizures on EEG.  6/14 remains somnolent.  6/16 unresponsive, ammonia back in 250 range; started carnitine, increased lactulose  6/17 ammonia back in 70s, still minimally responsive, remaining off sedation 6/21 LFTs elevated 6/23 no improvement in mental status, MRI ok, carnitine stopped as rising LFTs, possible DILI 6/24 spoke 1-2 words 6/25 speaking in phrases 6/26 held brief conversation -6/27, transferred from ICU to [facility] service   -6/28 slowly improving mentation, tolerating well tube feedings.  -6/29 mentation continues to improve, per her family at the bedside she is not yet back to her baseline.  -6/30 advanced diet, with improvement in swallow dysfunction.  -7/01 Cortrak removed.  -7/2: WBC upto 18K, foley removed, UA suggestive of infection> started antibiotics - 7/4, recurrent retention, Foley replaced - 7/6, urine culture growing Klebsiella, mental status improving   Subjective: Feels okay, no events overnight,   Assessment and Plan:  Acute metabolic encephalopathy Multifactorial, likely related to  hyperammonemia, severe hypoglycemia on original admission.  Liver US  really unremarkable but low quality -Clinically she has been slowly improving.  -Aspiration precautions, increase activity, PT OT Carnitine level > 400, will discontinue Levocarnitine   - In terms of hyperammonemia, she may have fatty liver disease from NASH or related to psychotropic meds, lactulose  dose decreased for diarrhea, increase again with ammonia starting to trend up a little, mental status improving - Cortrak removed, tolerating dysphagia diet -ToC consult for SNF  Leukocytosis Catheter related UTI Urinary retention - Had urinary catheter placed for retention on 6/29, noted to have some dysuria, foul smelling urine and worsening leukocytosis 7/2, UA abnormal - Treated with IV ceftriaxone , urine culture with 100,000 colonies of Klebsiella, consistent with ESBL, change to imipenem -Unfortunately had recurrent retention overnight, Foley replaced 7/3, routine catheter care  Acute on chronic systolic and diastolic CHF Echocardiogram with mild reduction in systolic function 45 to 50%, mild LVH, RV systolic function preserved, small pericardial effusion, mild aortic stenosis   LV hypokinetic apical septal segment, apical anterior segment, apical inferior segment, and apex.  - Diuresed with IV Lasix  earlier this admission, now appears euvolemic, GDMT limited by hypotension - Monitor off diuretics for now, add PRN at DC  Coronary artery disease NSTEMI with chronic occlusion of LAD.  Continue aspirin .,  Resume statin after discharge   CKD stage 3a, GFR 45-59 ml/min (HCC) AKI, hypernatremia,, hypokalemia.  - AKI has resolved  Malnutrition of moderate degree Continue nutritional supplements.   Hypothyroidism Continue levothyroxine    Chronic anemia Stable, monitor  Type 2 diabetes mellitus (HCC) Continue glucose cover and monitoring with insulin  sliding scale.  Basal and short acting pre meal  DVT  prophylaxis: Lovenox  Code Status: Full code Family Communication: no family At bedside, called and updated daughter  Sarah Davis Disposition Plan: TOC following, will need placement  Consultants:    Procedures:   Antimicrobials:    Objective: Vitals:   08/23/23 2012 08/23/23 2046 08/24/23 0459 08/24/23 0819  BP:  116/78 107/61 (!) 93/59  Pulse:  62 67 (!) 55  Resp:  18 18   Temp:  98.3 F (36.8 C) 98 F (36.7 C) 97.6 F (36.4 C)  TempSrc:  Oral Oral   SpO2: 94% 97% 98% 96%  Weight:      Height:        Intake/Output Summary (Last 24 hours) at 08/24/2023 0944 Last data filed at 08/23/2023 1700 Gross per 24 hour  Intake 377 ml  Output 650 ml  Net -273 ml   Filed Weights   08/21/23 0442 08/22/23 0500 08/23/23 0500  Weight: (P) 54 kg 53 kg 53 kg    Examination:  General exam: Chronically ill female appears much older than stated age, somnolent this morning but arousable, oriented to self and place HEENT: No JVD CVS: S1-S2, regular rhythm Lungs: Decreased breath sounds at the bases Abdomen: Soft, nontender, bowel sounds present, GU: Foley catheter Extremities: No edema Neuro: Somnolent but arousable, moves all extremities, no localizing signs  Skin: No rashes Psychiatry: Flat affect    Data Reviewed:   CBC: Recent Labs  Lab 08/20/23 0423 08/21/23 0901 08/23/23 0531 08/24/23 0313  WBC 18.4* 14.5* 11.4* 11.2*  HGB 8.5* 8.8* 8.7* 9.7*  HCT 28.0* 28.8* 28.4* 31.7*  MCV 87.2 87.5 87.7 87.3  PLT 459* 458* 434* 431*   Basic Metabolic Panel: Recent Labs  Lab 08/18/23 0305 08/20/23 0423 08/21/23 0901 08/22/23 0228 08/23/23 0531 08/24/23 0313  NA 140 138 134* 137 137 136  K 4.2 4.5 3.9 3.8 3.8 4.3  CL 103 102 99 103 103 102  CO2 27 25 24 24 24 25   GLUCOSE 107* 168* 183* 150* 170* 190*  BUN 28* 25* 25* 29* 27* 22  CREATININE 0.96 1.01* 1.13* 0.97 0.80 0.78  CALCIUM  8.5* 8.6* 8.6* 8.7* 8.8* 8.8*  MG 2.1  --   --   --   --   --    GFR: Estimated Creatinine  Clearance: 60.2 mL/min (by C-G formula based on SCr of 0.78 mg/dL). Liver Function Tests: Recent Labs  Lab 08/18/23 0305 08/20/23 0423  AST 64* 75*  ALT 81* 83*  ALKPHOS 156* 189*  BILITOT 0.8 0.5  PROT 5.8* 6.2*  ALBUMIN <1.5* 1.7*   No results for input(s): LIPASE, AMYLASE in the last 168 hours. Recent Labs  Lab 08/22/23 0228 08/24/23 0313  AMMONIA 46* 15   Coagulation Profile: No results for input(s): INR, PROTIME in the last 168 hours. Cardiac Enzymes: No results for input(s): CKTOTAL, CKMB, CKMBINDEX, TROPONINI in the last 168 hours. BNP (last 3 results) No results for input(s): PROBNP in the last 8760 hours. HbA1C: No results for input(s): HGBA1C in the last 72 hours.  CBG: Recent Labs  Lab 08/22/23 2106 08/23/23 0809 08/23/23 1116 08/23/23 1542 08/24/23 0819  GLUCAP 207* 175* 191* 134* 196*   Lipid Profile: No results for input(s): CHOL, HDL, LDLCALC, TRIG, CHOLHDL, LDLDIRECT in the last 72 hours. Thyroid Function Tests: No results for input(s): TSH, T4TOTAL, FREET4, T3FREE, THYROIDAB in the last 72 hours. Anemia Panel: No results for input(s): VITAMINB12, FOLATE, FERRITIN, TIBC, IRON, RETICCTPCT in the last 72 hours. Urine analysis:    Component Value Date/Time   COLORURINE YELLOW 08/20/2023 2200   APPEARANCEUR HAZY (A) 08/20/2023 2200   LABSPEC  1.005 08/20/2023 2200   PHURINE 7.0 08/20/2023 2200   GLUCOSEU NEGATIVE 08/20/2023 2200   HGBUR NEGATIVE 08/20/2023 2200   BILIRUBINUR NEGATIVE 08/20/2023 2200   KETONESUR NEGATIVE 08/20/2023 2200   PROTEINUR NEGATIVE 08/20/2023 2200   NITRITE POSITIVE (A) 08/20/2023 2200   LEUKOCYTESUR MODERATE (A) 08/20/2023 2200   Sepsis Labs: @LABRCNTIP (procalcitonin:4,lacticidven:4)  ) Recent Results (from the past 240 hours)  Urine Culture     Status: Abnormal   Collection Time: 08/21/23  7:09 AM   Specimen: Urine, Clean Catch  Result Value Ref Range Status    Specimen Description URINE, CLEAN CATCH  Final   Special Requests   Final    NONE Performed at [facility], [address], [place], [place] [address]    Culture (A)  Final    >=100,000 COLONIES/mL KLEBSIELLA PNEUMONIAE Confirmed Extended Spectrum Beta-Lactamase Producer (ESBL).  In bloodstream infections from ESBL organisms, carbapenems are preferred over piperacillin/tazobactam. They are shown to have a lower risk of mortality.    Report Status 08/24/2023 FINAL  Final   Organism ID, Bacteria KLEBSIELLA PNEUMONIAE (A)  Final      Susceptibility   Klebsiella pneumoniae - MIC*    AMPICILLIN >=32 RESISTANT Resistant     CEFAZOLIN >=64 RESISTANT Resistant     CEFEPIME >=32 RESISTANT Resistant     CEFTRIAXONE  >=64 RESISTANT Resistant     CIPROFLOXACIN 1 RESISTANT Resistant     GENTAMICIN <=1 SENSITIVE Sensitive     IMIPENEM 0.5 SENSITIVE Sensitive     NITROFURANTOIN 128 RESISTANT Resistant     TRIMETH/SULFA >=320 RESISTANT Resistant     AMPICILLIN/SULBACTAM >=32 RESISTANT Resistant     PIP/TAZO 64 INTERMEDIATE Intermediate ug/mL    * >=100,000 COLONIES/mL KLEBSIELLA PNEUMONIAE     Radiology Studies: No results found.    Scheduled Meds:  arformoterol   15 mcg Nebulization BID   aspirin   81 mg Oral Daily   Chlorhexidine  Gluconate Cloth  6 each Topical Daily   enoxaparin  (LOVENOX ) injection  40 mg Subcutaneous QHS   feeding supplement  237 mL Oral BID BM   folic acid   1 mg Oral Daily   insulin  aspart  0-9 Units Subcutaneous TID WC   insulin  aspart  3 Units Subcutaneous TID WC   insulin  glargine-yfgn  8 Units Subcutaneous Daily   lactulose   10 g Oral BID   levothyroxine   100 mcg Oral q morning   multivitamin with minerals  1 tablet Oral Daily   mouth rinse  15 mL Mouth Rinse Q4H   pyridOXINE   100 mg Oral Daily   revefenacin   175 mcg Nebulization Daily   thiamine   100 mg Oral Daily   Continuous Infusions:  cefTRIAXone  (ROCEPHIN )  IV 1 g (08/23/23 1013)     LOS:  25 days    Time spent:    Sigurd Pac, MD [facility]   08/24/2023, 9:44 AM

## 2023-08-25 DIAGNOSIS — G9341 Metabolic encephalopathy: Secondary | ICD-10-CM | POA: Diagnosis not present

## 2023-08-25 LAB — BASIC METABOLIC PANEL WITH GFR
Anion gap: 10 (ref 5–15)
BUN: 22 mg/dL (ref 8–23)
CO2: 25 mmol/L (ref 22–32)
Calcium: 9 mg/dL (ref 8.9–10.3)
Chloride: 102 mmol/L (ref 98–111)
Creatinine, Ser: 0.88 mg/dL (ref 0.44–1.00)
GFR, Estimated: >60 mL/min (ref >60)
Glucose, Bld: 154 mg/dL — ABNORMAL HIGH (ref 70–99)
Potassium: 4.1 mmol/L (ref 3.5–5.1)
Sodium: 137 mmol/L (ref 135–145)

## 2023-08-25 LAB — GLUCOSE, CAPILLARY
Glucose-Capillary: 235 mg/dL — ABNORMAL HIGH (ref 70–99)
Glucose-Capillary: 147 mg/dL — ABNORMAL HIGH (ref 70–99)
Glucose-Capillary: 174 mg/dL — ABNORMAL HIGH (ref 70–99)
Glucose-Capillary: 69 mg/dL — ABNORMAL LOW (ref 70–99)
Glucose-Capillary: 209 mg/dL — ABNORMAL HIGH (ref 70–99)

## 2023-08-25 MED ORDER — INSULIN GLARGINE-YFGN 100 UNIT/ML ~~LOC~~ SOLN
12.0000 [IU] | Freq: Every day | SUBCUTANEOUS | Status: DC
  Administered 2023-08-26 – 2023-09-01 (×7): 12 [IU] via SUBCUTANEOUS
  Filled 2023-08-25 (×7): qty 0.12

## 2023-08-25 MED ORDER — INSULIN ASPART 100 UNIT/ML IJ SOLN
5.0000 [IU] | Freq: Three times a day (TID) | INTRAMUSCULAR | Status: DC
  Administered 2023-08-25 – 2023-09-01 (×19): 5 [IU] via SUBCUTANEOUS

## 2023-08-25 NOTE — TOC Progression Note (Signed)
 Transition of Care [facility]) - Progression Note    Patient Details  Name: Sarah Davis MRN: [id number] Date of Birth: 11/11/1960  [facility] CM/SW Contact  Almarie CHRISTELLA [place], [place] Phone Number: 08/25/2023, 4:35 PM  Clinical Narrative:   CSW attempted to reach patient's sister, Madelin, to discuss SNF recommendation. Left a voicemail, awaiting call back. Will attempt other family tomorrow if no call back from [place]. CSW to follow.    Expected Discharge Plan:  (TBD) Barriers to Discharge: Continued Medical Work up  Expected Discharge Plan and Services       Living arrangements for the past 2 months: Apartment                                       Social Determinants of Health (SDOH) Interventions SDOH Screenings   Food Insecurity: Patient Unable To Answer (08/01/2023)  Housing: Unknown (08/01/2023)  Transportation Needs: Patient Unable To Answer (08/01/2023)  Utilities: Patient Unable To Answer (08/01/2023)  Tobacco Use: High Risk (08/04/2023)    Readmission Risk Interventions     No data to display

## 2023-08-25 NOTE — Plan of Care (Signed)
 Problem: Education: Goal: Knowledge of General Education information will improve Description: Including pain rating scale, medication(s)/side effects and non-pharmacologic comfort measures Outcome: Progressing   Problem: Health Behavior/Discharge Planning: Goal: Ability to manage health-related needs will improve Outcome: Progressing   Problem: Clinical Measurements: Goal: Ability to maintain clinical measurements within normal limits will improve Outcome: Progressing Goal: Will remain free from infection Outcome: Progressing Goal: Diagnostic test results will improve Outcome: Progressing Goal: Respiratory complications will improve Outcome: Progressing Goal: Cardiovascular complication will be avoided Outcome: Progressing   Problem: Activity: Goal: Risk for activity intolerance will decrease Outcome: Progressing   Problem: Nutrition: Goal: Adequate nutrition will be maintained Outcome: Progressing   Problem: Coping: Goal: Level of anxiety will decrease Outcome: Progressing   Problem: Elimination: Goal: Will not experience complications related to bowel motility Outcome: Progressing Goal: Will not experience complications related to urinary retention Outcome: Progressing   Problem: Pain Managment: Goal: General experience of comfort will improve and/or be controlled Outcome: Progressing   Problem: Safety: Goal: Ability to remain free from injury will improve Outcome: Progressing   Problem: Skin Integrity: Goal: Risk for impaired skin integrity will decrease Outcome: Progressing   Problem: Cardiac: Goal: Ability to achieve and maintain adequate cardiovascular perfusion will improve Outcome: Progressing   Problem: Education: Goal: Ability to describe self-care measures that may prevent or decrease complications (Diabetes Survival Skills Education) will improve Outcome: Progressing Goal: Individualized Educational Video(s) Outcome: Progressing   Problem:  Coping: Goal: Ability to adjust to condition or change in health will improve Outcome: Progressing   Problem: Fluid Volume: Goal: Ability to maintain a balanced intake and output will improve Outcome: Progressing   Problem: Health Behavior/Discharge Planning: Goal: Ability to identify and utilize available resources and services will improve Outcome: Progressing Goal: Ability to manage health-related needs will improve Outcome: Progressing   Problem: Metabolic: Goal: Ability to maintain appropriate glucose levels will improve Outcome: Progressing   Problem: Nutritional: Goal: Maintenance of adequate nutrition will improve Outcome: Progressing Goal: Progress toward achieving an optimal weight will improve Outcome: Progressing   Problem: Skin Integrity: Goal: Risk for impaired skin integrity will decrease Outcome: Progressing   Problem: Tissue Perfusion: Goal: Adequacy of tissue perfusion will improve Outcome: Progressing   Problem: Consults Goal: Venous Thromboembolism Patient Education Description: See Patient Education Module for education specifics. Outcome: Progressing Goal: Diagnosis - Venous Thromboembolism (VTE) Description: Choose a selection Outcome: Progressing Note: DVT (Deep Vein Thrombosis) Goal: Pharmacy Consult for anticoagulation Outcome: Progressing Goal: Skin Care Protocol Initiated - if Braden Score 18 or less Description: If consults are not indicated, leave blank or document N/A Outcome: Progressing Goal: Nutrition Consult-if indicated Outcome: Progressing Goal: Diabetes Guidelines if Diabetic/Glucose > 140 Description: If diabetic or lab glucose is > 140 mg/dl - Initiate Diabetes/Hyperglycemia Guidelines & Document Interventions  Outcome: Progressing   Problem: Phase I Progression Outcomes Goal: Pain controlled with appropriate interventions Outcome: Progressing Goal: Dyspnea controlled at rest (PE) Outcome: Progressing Goal: Tolerating  diet Outcome: Progressing Goal: Initial discharge plan identified Outcome: Progressing Goal: Voiding-avoid urinary catheter unless indicated Outcome: Progressing Goal: Hemodynamically stable Outcome: Progressing Goal: Other Phase I Outcomes/Goals Outcome: Progressing   Problem: Phase II Progression Outcomes Goal: Therapeutic drug levels for anticoagulation Outcome: Progressing Goal: 02 sats trending upward/stable (PE) Outcome: Progressing Goal: Discharge plan established Outcome: Progressing Goal: Tolerating diet Outcome: Progressing Goal: Other Phase II Outcomes/Goals Outcome: Progressing   Problem: Phase III Progression Outcomes Goal: 02 sats stabilized Outcome: Progressing Goal: Activity at appropriate level-compared to baseline Description: (UP  IN CHAIR FOR HEMODIALYSIS) Outcome: Progressing Goal: Discharge plan remains appropriate-arrangements made Outcome: Progressing Goal: Other Phase III Outcomes/Goals Outcome: Progressing   Problem: Discharge Progression Outcomes Goal: Barriers To Progression Addressed/Resolved Outcome: Progressing Goal: Discharge plan in place and appropriate Outcome: Progressing Goal: Pain controlled with appropriate interventions Outcome: Progressing Goal: Hemodynamically stable Outcome: Progressing Goal: Complications resolved/controlled Outcome: Progressing Goal: Tolerating diet Outcome: Progressing Goal: Activity appropriate for discharge plan Outcome: Progressing Goal: Other Discharge Outcomes/Goals Outcome: Progressing

## 2023-08-25 NOTE — Inpatient Diabetes Management (Signed)
 Inpatient Diabetes Program Recommendations  AACE/ADA: New Consensus Statement on Inpatient Glycemic Control (2015)  Target Ranges:  Prepandial:   less than 140 mg/dL      Peak postprandial:   less than 180 mg/dL (1-2 hours)      Critically ill patients:  140 - 180 mg/dL    Latest Reference Range & Units 08/24/23 08:19 08/24/23 13:22 08/24/23 17:06  Glucose-Capillary 70 - 99 mg/dL 803 (H)  5 units Novolog   8 units Semglee  @ 1009  359 (H)  12 units Novolog   153 (H)  5 units Novolog    (H): Data is abnormally high  Latest Reference Range & Units 08/25/23 09:09  Glucose-Capillary 70 - 99 mg/dL 764 (H)  6 units Novolog   8 units Semglee   (H): Data is abnormally high      Home DM Meds: Lantus  30 units daily   Current Orders: Semglee  8 units Daily     Novolog  Sensitive Correction Scale/ SSI (0-9 units) TID AC      Novolog  3 units TID with meals    MD- Note AM CBGs elevated the last 2 days  Please consider increasing the Semglee  to 12 units daily    --Will follow patient during hospitalization--  Adina Rudolpho Arrow RN, MSN, CDCES Diabetes Coordinator Inpatient Glycemic Control Team Team Pager: [phone] (8a-5p)

## 2023-08-25 NOTE — Progress Notes (Signed)
 PROGRESS NOTE    Sarah Davis  FMW:[id number] DOB: [birth year] DOA: 07/30/2023 PCP: Patient, No Pcp Per  Sarah Davis was transferred from [facility] to [facility] for cardiac catheterization.  62/F w history of bipolar, CKD, COPD, coronary artery disease, history of CVA, neuropathy and hypothyroidism, who was admitted to [facility] 07/28/23 with the working diagnosis of acute metabolic encephalopathy due to hypoglycemia.Treated with D10 drip.  She was found to have NSTEMI, prompting her transfer.  -Underwent cardiac catheterization 07/31/2023 noted to have chronic occlusion of mid LAD, medical management recommended. Post procedure she had worsening mentation, and  acute respiratory failure. Required intubation emergently. She transferred to the intensive care unit.  6/12 - CT negative for LVO, MRI negative for acute findings, advanced chronic microvascular ischemia,  no seizures on EEG.  6/14 remains somnolent.  6/16 unresponsive, ammonia back in 250 range; started carnitine, increased lactulose  6/17 ammonia back in 70s, still minimally responsive, remaining off sedation 6/21 LFTs elevated 6/23 no improvement in mental status, MRI ok, carnitine stopped as rising LFTs, possible DILI 6/24 spoke 1-2 words 6/25 speaking in phrases 6/26 held brief conversation -6/27, transferred from ICU to [facility] service   -6/28 slowly improving mentation, tolerating well tube feedings.  -6/29 mentation continues to improve, per her family at the bedside she is not yet back to her baseline.  -6/30 advanced diet, with improvement in swallow dysfunction.  -7/01 Cortrak removed.  -7/2: WBC upto 18K, foley removed, UA suggestive of infection> started antibiotics - 7/4, recurrent retention, Foley replaced - 7/6, urine culture growing Klebsiella, mental status improving   Subjective:  Feels better, no events overnight  Assessment and Plan:  Acute metabolic encephalopathy Multifactorial, likely related to  hyperammonemia, severe hypoglycemia on original admission.  Liver US  really unremarkable but low quality -Clinically she has been slowly improving.  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 or related to psychotropic meds, lactulose  dose decreased for diarrhea, increase again with ammonia trending up,, mental status is now improving - Cortrak removed, tolerating dysphagia diet -ToC consult for SNF, DC planning  Leukocytosis Catheter related UTI Urinary retention - Had urinary catheter placed for retention on 6/29, noted to have some dysuria, foul smelling urine and worsening leukocytosis 7/2, UA abnormal - Treated with IV ceftriaxone , urine culture with 100,000 colonies of Klebsiella, consistent with ESBL, changed to imipenem day 2/3 now -Unfortunately had recurrent retention overnight, Foley replaced 7/3, routine catheter care  Acute on chronic systolic and diastolic CHF Echocardiogram with mild reduction in systolic function 45 to 50%, mild LVH, RV systolic function preserved, small pericardial effusion, mild aortic stenosis   LV hypokinetic apical septal segment, apical anterior segment, apical inferior segment, and apex.  - Diuresed with IV Lasix  earlier this admission, now appears euvolemic, GDMT limited by hypotension - Monitor off diuretics for now, add PRN at DC  Coronary artery disease NSTEMI with chronic occlusion of LAD.  Continue aspirin .,  Resume statin after discharge   CKD stage 3a, GFR 45-59 ml/min (HCC) AKI, hypernatremia,, hypokalemia.  - AKI has resolved  Malnutrition of moderate degree Continue nutritional supplements.   Hypothyroidism Continue levothyroxine    Chronic anemia Stable, monitor  Type 2 diabetes mellitus (HCC) Continue glucose cover and monitoring with insulin  sliding scale.  Basal and short acting pre meal  DVT  prophylaxis: Lovenox  Code Status: Full code Family Communication: no family At bedside, called and  updated daughter Sarah Davis yesterday Disposition Plan: TOC following, will need placement  Consultants:    Procedures:   Antimicrobials:    Objective: Vitals:   08/25/23 0438 08/25/23 0600 08/25/23 0814 08/25/23 0857  BP:  99/64 102/65   Pulse:  82 73 72  Resp:    20  Temp:  98.4 F (36.9 C) 98 F (36.7 C)   TempSrc:  Oral Oral   SpO2:  95% 98%   Weight: 53.6 kg     Height:        Intake/Output Summary (Last 24 hours) at 08/25/2023 1131 Last data filed at 08/25/2023 1119 Gross per 24 hour  Intake 100 ml  Output 1500 ml  Net -1400 ml   Filed Weights   08/23/23 0500 08/24/23 0714 08/25/23 0438  Weight: 53 kg 53.4 kg 53.6 kg    Examination:  General exam: Chronically ill female appears much older than stated age, awake alert oriented to self and place, partly to time HEENT: No JVD CVS: S1-S2, regular rhythm Lungs: Decreased breath sounds at the bases Abdomen: Soft, nontender, bowel sounds present Extremities: No edema, GU: Foley catheter Neuro: Awake alert, mild cognitive deficits, moves all extremities, no localizing signs  Psychiatry: Flat affect    Data Reviewed:   CBC: Recent Labs  Lab 08/20/23 0423 08/21/23 0901 08/23/23 0531 08/24/23 0313  WBC 18.4* 14.5* 11.4* 11.2*  HGB 8.5* 8.8* 8.7* 9.7*  HCT 28.0* 28.8* 28.4* 31.7*  MCV 87.2 87.5 87.7 87.3  PLT 459* 458* 434* 431*   Basic Metabolic Panel: Recent Labs  Lab 08/21/23 0901 08/22/23 0228 08/23/23 0531 08/24/23 0313 08/25/23 0415  NA 134* 137 137 136 137  K 3.9 3.8 3.8 4.3 4.1  CL 99 103 103 102 102  CO2 24 24 24 25 25   GLUCOSE 183* 150* 170* 190* 154*  BUN 25* 29* 27* 22 22  CREATININE 1.13* 0.97 0.80 0.78 0.88  CALCIUM  8.6* 8.7* 8.8* 8.8* 9.0   GFR: Estimated Creatinine Clearance: 55.4 mL/min (by C-G formula based on SCr of 0.88 mg/dL). Liver Function Tests: Recent Labs  Lab  08/20/23 0423  AST 75*  ALT 83*  ALKPHOS 189*  BILITOT 0.5  PROT 6.2*  ALBUMIN 1.7*   No results for input(s): LIPASE, AMYLASE in the last 168 hours. Recent Labs  Lab 08/22/23 0228 08/24/23 0313  AMMONIA 46* 15   Coagulation Profile: No results for input(s): INR, PROTIME in the last 168 hours. Cardiac Enzymes: No results for input(s): CKTOTAL, CKMB, CKMBINDEX, TROPONINI in the last 168 hours. BNP (last 3 results) No results for input(s): PROBNP in the last 8760 hours. HbA1C: No results for input(s): HGBA1C in the last 72 hours.  CBG: Recent Labs  Lab 08/23/23 1542 08/24/23 0819 08/24/23 1322 08/24/23 1706 08/25/23 0909  GLUCAP 134* 196* 359* 153* 235*   Lipid Profile: No results for input(s): CHOL, HDL, LDLCALC, TRIG, CHOLHDL, LDLDIRECT in the last 72 hours. Thyroid Function Tests: No results for input(s): TSH, T4TOTAL, FREET4, T3FREE, THYROIDAB in the last 72 hours. Anemia Panel: No results for input(s): VITAMINB12, FOLATE, FERRITIN, TIBC, IRON, RETICCTPCT in the last 72 hours. Urine analysis:    Component Value Date/Time   COLORURINE YELLOW 08/20/2023 2200   APPEARANCEUR HAZY (A) 08/20/2023 2200   LABSPEC 1.005 08/20/2023 2200   PHURINE 7.0 08/20/2023 2200   GLUCOSEU NEGATIVE 08/20/2023 2200   HGBUR NEGATIVE 08/20/2023 2200   BILIRUBINUR NEGATIVE 08/20/2023 2200   KETONESUR NEGATIVE 08/20/2023 2200   PROTEINUR NEGATIVE 08/20/2023 2200  NITRITE POSITIVE (A) 08/20/2023 2200   LEUKOCYTESUR MODERATE (A) 08/20/2023 2200   Sepsis Labs: @LABRCNTIP (procalcitonin:4,lacticidven:4)  ) Recent Results (from the past 240 hours)  Urine Culture     Status: Abnormal   Collection Time: 08/21/23  7:09 AM   Specimen: Urine, Clean Catch  Result Value Ref Range Status   Specimen Description URINE, CLEAN CATCH  Final   Special Requests   Final    NONE Performed at [facility], [address],  [place], [place] [address]    Culture (A)  Final    >=100,000 COLONIES/mL KLEBSIELLA PNEUMONIAE Confirmed Extended Spectrum Beta-Lactamase Producer (ESBL).  In bloodstream infections from ESBL organisms, carbapenems are preferred over piperacillin/tazobactam. They are shown to have a lower risk of mortality.    Report Status 08/24/2023 FINAL  Final   Organism ID, Bacteria KLEBSIELLA PNEUMONIAE (A)  Final      Susceptibility   Klebsiella pneumoniae - MIC*    AMPICILLIN >=32 RESISTANT Resistant     CEFAZOLIN >=64 RESISTANT Resistant     CEFEPIME >=32 RESISTANT Resistant     CEFTRIAXONE  >=64 RESISTANT Resistant     CIPROFLOXACIN 1 RESISTANT Resistant     GENTAMICIN <=1 SENSITIVE Sensitive     IMIPENEM 0.5 SENSITIVE Sensitive     NITROFURANTOIN 128 RESISTANT Resistant     TRIMETH/SULFA >=320 RESISTANT Resistant     AMPICILLIN/SULBACTAM >=32 RESISTANT Resistant     PIP/TAZO 64 INTERMEDIATE Intermediate ug/mL    * >=100,000 COLONIES/mL KLEBSIELLA PNEUMONIAE     Radiology Studies: No results found.    Scheduled Meds:  arformoterol   15 mcg Nebulization BID   aspirin   81 mg Oral Daily   Chlorhexidine  Gluconate Cloth  6 each Topical Daily   enoxaparin  (LOVENOX ) injection  40 mg Subcutaneous QHS   feeding supplement  237 mL Oral BID BM   folic acid   1 mg Oral Daily   insulin  aspart  0-9 Units Subcutaneous TID WC   insulin  aspart  5 Units Subcutaneous TID WC   [START ON 08/26/2023] insulin  glargine-yfgn  12 Units Subcutaneous Daily   lactulose   10 g Oral BID   levothyroxine   100 mcg Oral q morning   multivitamin with minerals  1 tablet Oral Daily   mouth rinse  15 mL Mouth Rinse Q4H   revefenacin   175 mcg Nebulization Daily   thiamine   100 mg Oral Daily   Continuous Infusions:  meropenem  (MERREM ) IV 1 g (08/25/23 0602)     LOS: 26 days    Time spent:    Sigurd Pac, MD [facility]   08/25/2023, 11:31 AM

## 2023-08-26 DIAGNOSIS — G9341 Metabolic encephalopathy: Secondary | ICD-10-CM | POA: Diagnosis not present

## 2023-08-26 LAB — CBC
HCT: 31.9 % — ABNORMAL LOW (ref 36.0–46.0)
Hemoglobin: 9.8 g/dL — ABNORMAL LOW (ref 12.0–15.0)
MCH: 26.9 pg (ref 26.0–34.0)
MCHC: 30.7 g/dL (ref 30.0–36.0)
MCV: 87.6 fL (ref 80.0–100.0)
Platelets: 403 K/uL — ABNORMAL HIGH (ref 150–400)
RBC: 3.64 MIL/uL — ABNORMAL LOW (ref 3.87–5.11)
RDW: 19.8 % — ABNORMAL HIGH (ref 11.5–15.5)
WBC: 9.5 K/uL (ref 4.0–10.5)
nRBC: 0 % (ref 0.0–0.2)

## 2023-08-26 LAB — BASIC METABOLIC PANEL WITH GFR
Anion gap: 8 (ref 5–15)
BUN: 27 mg/dL — ABNORMAL HIGH (ref 8–23)
CO2: 27 mmol/L (ref 22–32)
Calcium: 9.2 mg/dL (ref 8.9–10.3)
Chloride: 103 mmol/L (ref 98–111)
Creatinine, Ser: 0.82 mg/dL (ref 0.44–1.00)
GFR, Estimated: >60 mL/min (ref >60)
Glucose, Bld: 124 mg/dL — ABNORMAL HIGH (ref 70–99)
Potassium: 4.1 mmol/L (ref 3.5–5.1)
Sodium: 138 mmol/L (ref 135–145)

## 2023-08-26 LAB — GLUCOSE, CAPILLARY
Glucose-Capillary: 175 mg/dL — ABNORMAL HIGH (ref 70–99)
Glucose-Capillary: 145 mg/dL — ABNORMAL HIGH (ref 70–99)
Glucose-Capillary: 140 mg/dL — ABNORMAL HIGH (ref 70–99)

## 2023-08-26 MED ORDER — TAMSULOSIN HCL 0.4 MG PO CAPS
0.4000 mg | ORAL_CAPSULE | Freq: Every day | ORAL | Status: DC
  Administered 2023-08-26 – 2023-09-01 (×7): 0.4 mg via ORAL
  Filled 2023-08-26 (×7): qty 1

## 2023-08-26 NOTE — TOC Progression Note (Signed)
 Transition of Care [facility]) - Progression Note    Patient Details  Name: Sarah Davis MRN: [id number] Date of Birth: [birth year]  [facility] CM/SW Contact  Almarie CHRISTELLA [place], [place] Phone Number: 08/26/2023, 1:53 PM  Clinical Narrative:   CSW received call back from sister, [name], to discuss disposition. CSW discussed barriers with SNF placement, and [name] in agreement, preference for CIR. CSW confirmed with [name] that family would be able to provide 24/7 assistance. [name] is a Engineer, civil (consulting) and has worked at [employer] before, she indicated understanding of what was required and that the family would be able to assist. [name] discussed requests for therapy today, including the patient asking if she could walk this weekend and any additional assistance that OT could provide to help the patient feed herself (family has found that she cannot hold real silverware as they are too heavy, but she can manage with plastic utensils). CSW relayed information to PT and OT assigned to see patient today, as well as asking for recommendation for CIR. CSW to follow.    Expected Discharge Plan:  (TBD) Barriers to Discharge: Continued Medical Work up, Conservator, museum/gallery and Services       Living arrangements for the past 2 months: Apartment                                       Social Determinants of Health (SDOH) Interventions SDOH Screenings   Food Insecurity: Patient Unable To Answer (08/01/2023)  Housing: Unknown (08/01/2023)  Transportation Needs: Patient Unable To Answer (08/01/2023)  Utilities: Patient Unable To Answer (08/01/2023)  Tobacco Use: High Risk (08/04/2023)    Readmission Risk Interventions     No data to display

## 2023-08-26 NOTE — Progress Notes (Signed)
 Speech Language Pathology Treatment: Dysphagia  Patient Details Name: Sarah Davis MRN: [id number] DOB: [birth year] Today's Date: 08/26/2023 Time: 9079-9066 SLP Time Calculation (min) (ACUTE ONLY): 13 min  Assessment / Plan / Recommendation Clinical Impression  Skilled therapy session focused on dysphagia goals. SLP facilitated session by observing patient consumption of D3 solids, purees and thin liquids via straw. Patient with timely mastication, complete oral clearance and no s/sx of aspiration throughout. Patient continues to demonstrate impulsivity with thin liquids requiring occlusion of straw to reduce bolus size. Patient with increased alertness and participation this date compared to prior. Recommend upgrade to D3 solids/thin liquids with supervision and physical assistance from staff.   HPI HPI: 63 year old female admitted from [facility] for cardiac cath 07/31/2023 without acute interventions required. MS changes post-procedure, somnolence and resp failure requiring emergent intubation 6/13-20. Dx acute metabolic encephalopathy, hyperammonemia, AKI, CAD with acute NSTEMI. PMHx stroke 2021, polysubstance abuse, tobacco abuse, bipolar, thyroid disease. MBS rec D2/thin      SLP Plan  Continue with current plan of care          Recommendations  Diet recommendations: Dysphagia 3 (mechanical soft);Thin liquid Liquids provided via: Cup;Straw Medication Administration: Whole meds with puree Supervision: Staff to assist with self feeding Compensations: Slow rate;Small sips/bites;Minimize environmental distractions Postural Changes and/or Swallow Maneuvers: Seated upright 90 degrees           Oral care BID;Staff/trained caregiver to provide oral care   Frequent or constant Supervision/Assistance Dysphagia, oropharyngeal phase (R13.12)     Continue with current plan of care     Jeffre Enriques M.A., CCC-SLP 08/26/2023, 9:37 AM

## 2023-08-26 NOTE — Progress Notes (Addendum)
 PROGRESS NOTE    Sarah Davis  FMW:[id number] DOB: 1960-04-14 DOA: 07/30/2023 PCP: Patient, No Pcp Per  Mrs. Sarah Davis was transferred from [facility] to [facility] for cardiac catheterization.  62/F w history of bipolar, CKD, COPD, coronary artery disease, history of CVA, neuropathy and hypothyroidism, who was admitted to [facility] 07/28/23 with the working diagnosis of acute metabolic encephalopathy due to hypoglycemia.Treated with D10 drip.  She was found to have NSTEMI, prompting her transfer.  -Underwent cardiac catheterization 07/31/2023 noted to have chronic occlusion of mid LAD, medical management recommended. Post procedure she had worsening mentation, and  acute respiratory failure. Required intubation emergently. She transferred to the intensive care unit.  6/12 - CT negative for LVO, MRI negative for acute findings, advanced chronic microvascular ischemia,  no seizures on EEG.  6/14 remains somnolent.  6/16 unresponsive, ammonia back in 250 range; started carnitine, increased lactulose  6/17 ammonia back in 70s, still minimally responsive, remaining off sedation 6/21 LFTs elevated 6/23 no improvement in mental status, MRI ok, carnitine stopped as rising LFTs, possible DILI 6/24 spoke 1-2 words 6/25 speaking in phrases 6/26 held brief conversation -6/27, transferred from ICU to [facility] service   -6/28 slowly improving mentation, tolerating well tube feedings.  -6/29 mentation continues to improve, per her family at the bedside she is not yet back to her baseline.  -6/30 advanced diet, with improvement in swallow dysfunction.  -7/01 Cortrak removed.  -7/2: WBC upto 18K, foley removed, UA suggestive of infection> started ceftriaxone  - 7/4, recurrent retention, Foley replaced - 7/6, urine culture growing Klebsiella, mental status improving - 7/6 urine culture growing ESBL Klebsiella, ceftriaxone  changed to meropenem    Subjective:  -Feels okay, just fed breakfast, sitting up in bed,  pleasant  Assessment and Plan:  Acute metabolic encephalopathy Multifactorial, likely related to hyperammonemia, severe hypoglycemia on original admission.  Liver US  really unremarkable but low quality - Prolonged ICU for 2 weeks, clinically she has been slowly improving.  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 or related to psychotropic meds, lactulose  dose decreased for diarrhea, increased again with ammonia trending up,, mental status is now improving - Cortrak removed, tolerating dysphagia diet - Discharge planning, TOC following, CIR evaluating  Leukocytosis Catheter related UTI Urinary retention - Had urinary catheter placed for retention on 6/29, noted to have some dysuria, foul smelling urine and worsening leukocytosis 7/2, UA abnormal - Treated with IV ceftriaxone , urine culture with 100,000 colonies of Klebsiella, consistent with ESBL, changed to imipenem day 3/5 now, -Unfortunately had recurrent retention overnight, Foley replaced 7/3, routine catheter care -could complete Abx in CIR if approved  Acute on chronic systolic and diastolic CHF Echocardiogram with mild reduction in systolic function 45 to 50%, mild LVH, RV systolic function preserved, small pericardial effusion, mild aortic stenosis   LV hypokinetic apical septal segment, apical anterior segment, apical inferior segment, and apex.  - Diuresed with IV Lasix  earlier this admission, now appears euvolemic, GDMT limited by hypotension - Monitor off diuretics for now, add PRN at DC  Coronary artery disease NSTEMI with chronic occlusion of LAD.  Continue aspirin .,  Resume statin after discharge   CKD stage 3a, GFR 45-59 ml/min (HCC) AKI, hypernatremia,, hypokalemia.  - AKI has resolved  Malnutrition of moderate degree Continue nutritional supplements.   Hypothyroidism Continue  levothyroxine    Chronic anemia Stable, monitor  Type 2 diabetes mellitus (HCC) CBGs are  stable, continue glargine and SSI  DVT prophylaxis: Lovenox  Code Status: Full code Family Communication: no family At bedside, called and updated daughter Sarah Davis on 7/6, called Sister Sarah Davis today Disposition Plan: TOC following, CIR eval ongoing   Objective: Vitals:   08/26/23 0414 08/26/23 0500 08/26/23 0752 08/26/23 0855  BP: 99/65  105/66   Pulse: 62  82 78  Resp: 18  17 20   Temp: 98.4 F (36.9 C)  97.9 F (36.6 C)   TempSrc:   Oral   SpO2: 95%   94%  Weight:  53.6 kg    Height:        Intake/Output Summary (Last 24 hours) at 08/26/2023 1026 Last data filed at 08/26/2023 9161 Gross per 24 hour  Intake 820.11 ml  Output 3000 ml  Net -2179.89 ml   Filed Weights   08/24/23 0714 08/25/23 0438 08/26/23 0500  Weight: 53.4 kg 53.6 kg 53.6 kg    Examination:  General exam: Chronically ill female laying in bed, appears much older than stated age, awake alert oriented to self and place, partly to time HEENT: No JVD CVS: S1-S2, regular rhythm Lungs: Decreased breath sounds at the bases Abdomen: Soft, nontender, bowel sounds present Extremities: No edema GU: Foley catheter Neuro: Awake alert, mild cognitive deficits, moves all extremities, no localizing signs  Psychiatry: Flat affect    Data Reviewed:   CBC: Recent Labs  Lab 08/20/23 0423 08/21/23 0901 08/23/23 0531 08/24/23 0313 08/26/23 0419  WBC 18.4* 14.5* 11.4* 11.2* 9.5  HGB 8.5* 8.8* 8.7* 9.7* 9.8*  HCT 28.0* 28.8* 28.4* 31.7* 31.9*  MCV 87.2 87.5 87.7 87.3 87.6  PLT 459* 458* 434* 431* 403*   Basic Metabolic Panel: Recent Labs  Lab 08/22/23 0228 08/23/23 0531 08/24/23 0313 08/25/23 0415 08/26/23 0419  NA 137 137 136 137 138  K 3.8 3.8 4.3 4.1 4.1  CL 103 103 102 102 103  CO2 24 24 25 25 27   GLUCOSE 150* 170* 190* 154* 124*  BUN 29* 27* 22 22 27*  CREATININE 0.97 0.80 0.78 0.88 0.82  CALCIUM  8.7* 8.8*  8.8* 9.0 9.2   GFR: Estimated Creatinine Clearance: 59.4 mL/min (by C-G formula based on SCr of 0.82 mg/dL). Liver Function Tests: Recent Labs  Lab 08/20/23 0423  AST 75*  ALT 83*  ALKPHOS 189*  BILITOT 0.5  PROT 6.2*  ALBUMIN 1.7*   No results for input(s): LIPASE, AMYLASE in the last 168 hours. Recent Labs  Lab 08/22/23 0228 08/24/23 0313  AMMONIA 46* 15   Coagulation Profile: No results for input(s): INR, PROTIME in the last 168 hours. Cardiac Enzymes: No results for input(s): CKTOTAL, CKMB, CKMBINDEX, TROPONINI in the last 168 hours. BNP (last 3 results) No results for input(s): PROBNP in the last 8760 hours. HbA1C: No results for input(s): HGBA1C in the last 72 hours.  CBG: Recent Labs  Lab 08/25/23 1133 08/25/23 1712 08/25/23 2000 08/25/23 2134 08/26/23 0752  GLUCAP 147* 174* 69* 209* 175*   Lipid Profile: No results for input(s): CHOL, HDL, LDLCALC, TRIG, CHOLHDL, LDLDIRECT in the last 72 hours. Thyroid Function Tests: No results for input(s): TSH, T4TOTAL, FREET4, T3FREE, THYROIDAB in the last 72 hours. Anemia Panel: No results for input(s): VITAMINB12, FOLATE, FERRITIN, TIBC, IRON, RETICCTPCT in the last 72 hours. Urine analysis:    Component Value Date/Time   COLORURINE YELLOW 08/20/2023 2200   APPEARANCEUR HAZY (A) 08/20/2023 2200   LABSPEC 1.005 08/20/2023 2200   PHURINE 7.0 08/20/2023 2200   GLUCOSEU NEGATIVE  08/20/2023 2200   HGBUR NEGATIVE 08/20/2023 2200   BILIRUBINUR NEGATIVE 08/20/2023 2200   KETONESUR NEGATIVE 08/20/2023 2200   PROTEINUR NEGATIVE 08/20/2023 2200   NITRITE POSITIVE (A) 08/20/2023 2200   LEUKOCYTESUR MODERATE (A) 08/20/2023 2200   Sepsis Labs: @LABRCNTIP (procalcitonin:4,lacticidven:4)  ) Recent Results (from the past 240 hours)  Urine Culture     Status: Abnormal   Collection Time: 08/21/23  7:09 AM   Specimen: Urine, Clean Catch  Result Value Ref Range Status    Specimen Description URINE, CLEAN CATCH  Final   Special Requests   Final    NONE Performed at [facility], [address], [place], [place] [address]    Culture (A)  Final    >=100,000 COLONIES/mL KLEBSIELLA PNEUMONIAE Confirmed Extended Spectrum Beta-Lactamase Producer (ESBL).  In bloodstream infections from ESBL organisms, carbapenems are preferred over piperacillin/tazobactam. They are shown to have a lower risk of mortality.    Report Status 08/24/2023 FINAL  Final   Organism ID, Bacteria KLEBSIELLA PNEUMONIAE (A)  Final      Susceptibility   Klebsiella pneumoniae - MIC*    AMPICILLIN >=32 RESISTANT Resistant     CEFAZOLIN >=64 RESISTANT Resistant     CEFEPIME >=32 RESISTANT Resistant     CEFTRIAXONE  >=64 RESISTANT Resistant     CIPROFLOXACIN 1 RESISTANT Resistant     GENTAMICIN <=1 SENSITIVE Sensitive     IMIPENEM 0.5 SENSITIVE Sensitive     NITROFURANTOIN 128 RESISTANT Resistant     TRIMETH/SULFA >=320 RESISTANT Resistant     AMPICILLIN/SULBACTAM >=32 RESISTANT Resistant     PIP/TAZO 64 INTERMEDIATE Intermediate ug/mL    * >=100,000 COLONIES/mL KLEBSIELLA PNEUMONIAE     Radiology Studies: No results found.    Scheduled Meds:  arformoterol   15 mcg Nebulization BID   aspirin   81 mg Oral Daily   Chlorhexidine  Gluconate Cloth  6 each Topical Daily   enoxaparin  (LOVENOX ) injection  40 mg Subcutaneous QHS   feeding supplement  237 mL Oral BID BM   folic acid   1 mg Oral Daily   insulin  aspart  0-9 Units Subcutaneous TID WC   insulin  aspart  5 Units Subcutaneous TID WC   insulin  glargine-yfgn  12 Units Subcutaneous Daily   lactulose   10 g Oral BID   levothyroxine   100 mcg Oral q morning   multivitamin with minerals  1 tablet Oral Daily   mouth rinse  15 mL Mouth Rinse Q4H   revefenacin   175 mcg Nebulization Daily   thiamine   100 mg Oral Daily   Continuous Infusions:  meropenem  (MERREM ) IV 1 g (08/26/23 0631)     LOS: 27 days    Time spent:     Sigurd Pac, MD [facility]   08/26/2023, 10:26 AM

## 2023-08-26 NOTE — Progress Notes (Signed)
   Inpatient Rehab Admissions Coordinator :  Per therapy change in recommendations, patient was screened for CIR candidacy by Ottie Glazier RN MSN.  At this time patient appears to be a potential candidate for CIR. I will place a rehab consult per protocol for full assessment. Please call me with any questions.  Ottie Glazier RN MSN Admissions Coordinator [phone]

## 2023-08-26 NOTE — Plan of Care (Signed)
 Problem: Education: Goal: Knowledge of General Education information will improve Description: Including pain rating scale, medication(s)/side effects and non-pharmacologic comfort measures Outcome: Progressing   Problem: Health Behavior/Discharge Planning: Goal: Ability to manage health-related needs will improve Outcome: Progressing   Problem: Clinical Measurements: Goal: Ability to maintain clinical measurements within normal limits will improve Outcome: Progressing Goal: Will remain free from infection Outcome: Progressing Goal: Diagnostic test results will improve Outcome: Progressing Goal: Respiratory complications will improve Outcome: Progressing Goal: Cardiovascular complication will be avoided Outcome: Progressing   Problem: Activity: Goal: Risk for activity intolerance will decrease Outcome: Progressing   Problem: Nutrition: Goal: Adequate nutrition will be maintained Outcome: Progressing   Problem: Coping: Goal: Level of anxiety will decrease Outcome: Progressing   Problem: Elimination: Goal: Will not experience complications related to bowel motility Outcome: Progressing Goal: Will not experience complications related to urinary retention Outcome: Progressing   Problem: Pain Managment: Goal: General experience of comfort will improve and/or be controlled Outcome: Progressing   Problem: Safety: Goal: Ability to remain free from injury will improve Outcome: Progressing   Problem: Skin Integrity: Goal: Risk for impaired skin integrity will decrease Outcome: Progressing   Problem: Cardiac: Goal: Ability to achieve and maintain adequate cardiovascular perfusion will improve Outcome: Progressing   Problem: Education: Goal: Ability to describe self-care measures that may prevent or decrease complications (Diabetes Survival Skills Education) will improve Outcome: Progressing Goal: Individualized Educational Video(s) Outcome: Progressing   Problem:  Coping: Goal: Ability to adjust to condition or change in health will improve Outcome: Progressing   Problem: Fluid Volume: Goal: Ability to maintain a balanced intake and output will improve Outcome: Progressing   Problem: Health Behavior/Discharge Planning: Goal: Ability to identify and utilize available resources and services will improve Outcome: Progressing Goal: Ability to manage health-related needs will improve Outcome: Progressing   Problem: Metabolic: Goal: Ability to maintain appropriate glucose levels will improve Outcome: Progressing   Problem: Nutritional: Goal: Maintenance of adequate nutrition will improve Outcome: Progressing Goal: Progress toward achieving an optimal weight will improve Outcome: Progressing   Problem: Skin Integrity: Goal: Risk for impaired skin integrity will decrease Outcome: Progressing   Problem: Tissue Perfusion: Goal: Adequacy of tissue perfusion will improve Outcome: Progressing   Problem: Consults Goal: Venous Thromboembolism Patient Education Description: See Patient Education Module for education specifics. Outcome: Progressing Goal: Diagnosis - Venous Thromboembolism (VTE) Description: Choose a selection Outcome: Progressing Goal: Pharmacy Consult for anticoagulation Outcome: Progressing Goal: Skin Care Protocol Initiated - if Braden Score 18 or less Description: If consults are not indicated, leave blank or document N/A Outcome: Progressing Goal: Nutrition Consult-if indicated Outcome: Progressing Goal: Diabetes Guidelines if Diabetic/Glucose > 140 Description: If diabetic or lab glucose is > 140 mg/dl - Initiate Diabetes/Hyperglycemia Guidelines & Document Interventions  Outcome: Progressing   Problem: Phase I Progression Outcomes Goal: Pain controlled with appropriate interventions Outcome: Progressing Goal: Dyspnea controlled at rest (PE) Outcome: Progressing Goal: Tolerating diet Outcome: Progressing Goal:  Initial discharge plan identified Outcome: Progressing Goal: Voiding-avoid urinary catheter unless indicated Outcome: Progressing Goal: Hemodynamically stable Outcome: Progressing Goal: Other Phase I Outcomes/Goals Outcome: Progressing   Problem: Phase II Progression Outcomes Goal: Therapeutic drug levels for anticoagulation Outcome: Progressing Goal: 02 sats trending upward/stable (PE) Outcome: Progressing Goal: Discharge plan established Outcome: Progressing Goal: Tolerating diet Outcome: Progressing Goal: Other Phase II Outcomes/Goals Outcome: Progressing   Problem: Phase III Progression Outcomes Goal: 02 sats stabilized Outcome: Progressing Goal: Activity at appropriate level-compared to baseline Description: (UP IN CHAIR FOR HEMODIALYSIS) Outcome:  Progressing Goal: Discharge plan remains appropriate-arrangements made Outcome: Progressing Goal: Other Phase III Outcomes/Goals Outcome: Progressing   Problem: Discharge Progression Outcomes Goal: Barriers To Progression Addressed/Resolved Outcome: Progressing Goal: Discharge plan in place and appropriate Outcome: Progressing Goal: Pain controlled with appropriate interventions Outcome: Progressing Goal: Hemodynamically stable Outcome: Progressing Goal: Complications resolved/controlled Outcome: Progressing Goal: Tolerating diet Outcome: Progressing Goal: Activity appropriate for discharge plan Outcome: Progressing Goal: Other Discharge Outcomes/Goals Outcome: Progressing

## 2023-08-26 NOTE — Progress Notes (Signed)
 Physical Therapy Treatment Patient Details Name: Sarah Davis MRN: [id number] DOB: [birth year] Today's Date: 08/26/2023   History of Present Illness 63 yo female admitted 07/30/23. 6/12 cardiac cath with somnolence and respiratory failure post procedure requiring intubation 6/13-6/20. NSTEMI, encephalopathy and AKI impacting course. PMhx: polysubstance abuse, bipolar disorder, thyroid disease, hep C, COPD, DM, HFrEF, CVA with Rt hemiparesis    PT Comments  Patient progressing with sitting balance when able to lean forward though still demonstrates retropulsion at times.  She would engage briefly to lean forward to touch IV pole with cues and she was able to stand using LE's with +2 A though seems fearful of falling needing A to return to sitting EOB.  Transition to chair with +2 HHA with pt unable to lift her feet, sliding them on the floor to get closer to chair.  She will have help upon d/c per her mother and family is interested in acute inpatient rehab (>3 hours/day) prior to d/c home.  Given prior functional level and help at home feel this is appropriate.  PT will continue to follow.    If plan is discharge home, recommend the following: Two people to help with walking and/or transfers;Two people to help with bathing/dressing/bathroom;Supervision due to cognitive status;Assist for transportation;Help with stairs or ramp for entrance   Can travel by private vehicle     No  Equipment Recommendations  Hospital bed;Wheelchair cushion (measurements PT);Wheelchair (measurements PT)    Recommendations for Other Services Rehab consult     Precautions / Restrictions Precautions Precautions: Fall Recall of Precautions/Restrictions: Impaired     Mobility  Bed Mobility Overal bed mobility: Needs Assistance Bed Mobility: Rolling, Sidelying to Sit Rolling: Mod assist Sidelying to sit: Mod assist, +2 for safety/equipment, Used rails       General bed mobility comments: cues for technique to use  rail, pt needing help for legs off EOB due to slow initiation, assist for trunk upright.    Transfers Overall transfer level: Needs assistance Equipment used: 2 person hand held assist Transfers: Sit to/from Stand, Bed to chair/wheelchair/BSC Sit to Stand: Mod assist, Max assist, +2 safety/equipment Stand pivot transfers: Max assist, +2 physical assistance         General transfer comment: assist to initiate with anterior weight shift (touch the IV pole) then able to push up with mod A of two to stand, though c/o falling so assist to sit back slowly on EOB, seconds attempt, pt slower to reach forward and needing A due to R foot too far forward; pivot to recliner pt with difficulty picking up her feet so scooting them on the floor and continued assist for upright with +2 HHA.    Ambulation/Gait                   Stairs             Wheelchair Mobility     Tilt Bed    Modified Rankin (Stroke Patients Only)       Balance Overall balance assessment: Needs assistance Sitting-balance support: Feet supported Sitting balance-Leahy Scale: Poor Sitting balance - Comments: sitting at times with CGA though periods where she pushes back and needing max A to recover; able to redirect at times to reach forward to touch IV pole Postural control: Posterior lean Standing balance support: Bilateral upper extremity supported Standing balance-Leahy Scale: Zero Standing balance comment: bilateral UE support for balance  Communication    Cognition Arousal: Alert Behavior During Therapy: Flat affect   PT - Cognitive impairments: Attention, Initiation, Problem solving                       PT - Cognition Comments: Slow to process commands, staring at times when leaning back, but still responding; Decreased initiation Following commands: Impaired Following commands impaired: Follows one step commands inconsistently, Follows one step  commands with increased time    Cueing Cueing Techniques: Verbal cues, Gestural cues, Tactile cues  Exercises      General Comments General comments (skin integrity, edema, etc.): mother asking about OT coming to work on using lighter weight utensils for self feeding, also discussed intensive inpatient rehab, states pt would go to stay with her daughter and her two adult grandchildren can also help      Pertinent Vitals/Pain Pain Assessment Pain Assessment: Faces Faces Pain Scale: No hurt    Home Living                          Prior Function            PT Goals (current goals can now be found in the care plan section) Progress towards PT goals: Progressing toward goals    Frequency    Min 2X/week      PT Plan      Co-evaluation              AM-PAC PT 6 Clicks Mobility   Outcome Measure  Help needed turning from your back to your side while in a flat bed without using bedrails?: A Lot Help needed moving from lying on your back to sitting on the side of a flat bed without using bedrails?: Total Help needed moving to and from a bed to a chair (including a wheelchair)?: Total Help needed standing up from a chair using your arms (e.g., wheelchair or bedside chair)?: Total Help needed to walk in hospital room?: Total Help needed climbing 3-5 steps with a railing? : Total 6 Click Score: 7    End of Session Equipment Utilized During Treatment: Gait belt Activity Tolerance: Patient limited by fatigue Patient left: in chair;with chair alarm set;with call bell/phone within reach;with family/visitor present   PT Visit Diagnosis: Other abnormalities of gait and mobility (R26.89);Difficulty in walking, not elsewhere classified (R26.2);Other symptoms and signs involving the nervous system (R29.898)     Time: 8891-8862 PT Time Calculation (min) (ACUTE ONLY): 29 min  Charges:    $Therapeutic Activity: 23-37 mins PT General Charges $$ ACUTE PT VISIT: 1  Visit                     Sarah Davis, PT Acute Rehabilitation Services Office:[phone] 08/26/2023    Sarah Davis 08/26/2023, 12:37 PM

## 2023-08-26 NOTE — Progress Notes (Signed)
 Occupational Therapy Treatment Patient Details Name: Sarah Davis MRN: [id number] DOB: [birth year] Today's Date: 08/26/2023   History of present illness 63 yo female admitted 07/30/23. 6/12 cardiac cath with somnolence and respiratory failure post procedure requiring intubation 6/13-6/20. NSTEMI, encephalopathy and AKI impacting course. PMhx: polysubstance abuse, bipolar disorder, thyroid disease, hep C, COPD, DM, HFrEF, CVA with Rt hemiparesis   OT comments  Patient seen by OT to address self feeding per family request due to patient having difficulty using regular utensils. Patient up in recliner and provided setup with regular utensils. Patient was able to manage regular fork and spoon but required cues for sequencing and to stay on tasks. Patient able to manage cup with LUE. Family educated on assistance and cues to provide to allow for self feeding as well as positioning up in chair or chair position in bed. Patient assisted back to bed at end of session with use of stedy with patient demonstrating gains with sit to stands. Patient's mother and family state they will be able to assist patient following discharge and will now like to pursue AIR for continued rehab and with current gains patient appears to be a good candidate. Patient will benefit from intensive inpatient follow-up therapy, >3 hours/day. Acute OT to continue to follow to address established goals to facilitate DC to next venue of care.        If plan is discharge home, recommend the following:      Equipment Recommendations  Other (comment) (TBD)    Recommendations for Other Services      Precautions / Restrictions Precautions Precautions: Fall Recall of Precautions/Restrictions: Impaired       Mobility Bed Mobility Overal bed mobility: Needs Assistance Bed Mobility: Rolling, Sit to Supine Rolling: Mod assist     Sit to supine: Max assist, +2 for physical assistance   General bed mobility comments: cues on leaning on  RUE and bring BLE onto bed with patient requiring assistance to compelte    Transfers Overall transfer level: Needs assistance Equipment used: Ambulation equipment used Transfers: Sit to/from Stand, Bed to chair/wheelchair/BSC Sit to Stand: Mod assist, +2 physical assistance           General transfer comment: mod assist +2 to stand from recliner and min assist +2 from stedy pads Transfer via Lift Equipment: Stedy   Balance Overall balance assessment: Needs assistance Sitting-balance support: Feet supported Sitting balance-Leahy Scale: Poor Sitting balance - Comments: min assist for sitting balance on EOB due to posterior leaning Postural control: Posterior lean Standing balance support: Bilateral upper extremity supported Standing balance-Leahy Scale: Zero Standing balance comment: reliant on stedy for support                           ADL either performed or assessed with clinical judgement   ADL Overall ADL's : Needs assistance/impaired Eating/Feeding: Supervision/ safety;Set up;Cueing for sequencing;Minimal assistance Eating/Feeding Details (indicate cue type and reason): occasional min assist to load utensil, cues to stay on tasks and for sequencing Grooming: Wash/dry hands;Wash/dry face;Supervision/safety;Sitting                                 General ADL Comments: family state patient having difficulty feeding self. OT provided setup and  provided family education on assistance and cues to provide to increase patient participation with self feeding.    Extremity/Trunk Assessment Upper Extremity Assessment Upper Extremity Assessment:  Generalized weakness RUE Deficits / Details: history of CVA LUE Deficits / Details: able to use with self care tasks            Vision       Perception     Praxis     Communication Communication Communication: Impaired Factors Affecting Communication: Difficulty expressing self   Cognition Arousal:  Alert Behavior During Therapy: Flat affect Cognition: Cognition impaired   Orientation impairments: Situation, Time Awareness: Intellectual awareness impaired Memory impairment (select all impairments): Short-term memory Attention impairment (select first level of impairment): Focused attention Executive functioning impairment (select all impairments): Initiation, Sequencing, Problem solving OT - Cognition Comments: requires frequent cues to stay on task                 Following commands: Impaired Following commands impaired: Follows one step commands inconsistently, Follows one step commands with increased time      Cueing   Cueing Techniques: Verbal cues, Gestural cues, Tactile cues  Exercises      Shoulder Instructions       General Comments education provided to family on assistance and cues to provide to allow patient to feed self    Pertinent Vitals/ Pain       Pain Assessment Pain Assessment: Faces Faces Pain Scale: Hurts a little bit Pain Location: sacral area Pain Descriptors / Indicators: Discomfort, Grimacing Pain Intervention(s): Monitored during session, Repositioned  Home Living                                          Prior Functioning/Environment              Frequency  Min 2X/week        Progress Toward Goals  OT Goals(current goals can now be found in the care plan section)  Progress towards OT goals: Progressing toward goals  Acute Rehab OT Goals Patient Stated Goal: to rest OT Goal Formulation: With patient Time For Goal Achievement: 08/29/23 Potential to Achieve Goals: Fair ADL Goals Pt Will Perform Grooming: with mod assist;bed level Pt Will Perform Upper Body Bathing: with mod assist;sitting Pt/caregiver will Perform Home Exercise Program: Increased strength;Both right and left upper extremity;With minimal assist;With written HEP provided Additional ADL Goal #1: Pt to follow one step commands > 75% of the  time during functional tasks Additional ADL Goal #2: Pt to maintain sitting balance unsupported with no more than Min A during functional tasks  Plan      Co-evaluation                 AM-PAC OT 6 Clicks Daily Activity     Outcome Measure   Help from another person eating meals?: A Little Help from another person taking care of personal grooming?: A Little Help from another person toileting, which includes using toliet, bedpan, or urinal?: A Lot Help from another person bathing (including washing, rinsing, drying)?: A Lot Help from another person to put on and taking off regular upper body clothing?: A Lot Help from another person to put on and taking off regular lower body clothing?: Total 6 Click Score: 13    End of Session Equipment Utilized During Treatment: Gait belt;Other (comment) Laurent)  OT Visit Diagnosis: Other abnormalities of gait and mobility (R26.89);Muscle weakness (generalized) (M62.81);Other symptoms and signs involving cognitive function   Activity Tolerance Patient tolerated treatment well   Patient Left in  bed;with call bell/phone within reach;with bed alarm set;with family/visitor present   Nurse Communication Mobility status        Time: 8683-8657 OT Time Calculation (min): 26 min  Charges: OT General Charges $OT Visit: 1 Visit OT Treatments $Self Care/Home Management : 23-37 mins  Dick Laine, OTA Acute Rehabilitation Services  Office [phone]   Jeb LITTIE Laine 08/26/2023, 2:04 PM

## 2023-08-27 DIAGNOSIS — G9341 Metabolic encephalopathy: Secondary | ICD-10-CM | POA: Diagnosis not present

## 2023-08-27 LAB — CBC
HCT: 33.9 % — ABNORMAL LOW (ref 36.0–46.0)
Hemoglobin: 10.2 g/dL — ABNORMAL LOW (ref 12.0–15.0)
MCH: 26.4 pg (ref 26.0–34.0)
MCHC: 30.1 g/dL (ref 30.0–36.0)
MCV: 87.8 fL (ref 80.0–100.0)
Platelets: 330 K/uL (ref 150–400)
RBC: 3.86 MIL/uL — ABNORMAL LOW (ref 3.87–5.11)
RDW: 19.3 % — ABNORMAL HIGH (ref 11.5–15.5)
WBC: 10.8 K/uL — ABNORMAL HIGH (ref 4.0–10.5)
nRBC: 0 % (ref 0.0–0.2)

## 2023-08-27 LAB — BASIC METABOLIC PANEL WITH GFR
Anion gap: 13 (ref 5–15)
BUN: 23 mg/dL (ref 8–23)
CO2: 27 mmol/L (ref 22–32)
Calcium: 9.4 mg/dL (ref 8.9–10.3)
Chloride: 96 mmol/L — ABNORMAL LOW (ref 98–111)
Creatinine, Ser: 0.95 mg/dL (ref 0.44–1.00)
GFR, Estimated: >60 mL/min (ref >60)
Glucose, Bld: 221 mg/dL — ABNORMAL HIGH (ref 70–99)
Potassium: 4 mmol/L (ref 3.5–5.1)
Sodium: 136 mmol/L (ref 135–145)

## 2023-08-27 LAB — GLUCOSE, CAPILLARY
Glucose-Capillary: 132 mg/dL — ABNORMAL HIGH (ref 70–99)
Glucose-Capillary: 135 mg/dL — ABNORMAL HIGH (ref 70–99)
Glucose-Capillary: 146 mg/dL — ABNORMAL HIGH (ref 70–99)
Glucose-Capillary: 158 mg/dL — ABNORMAL HIGH (ref 70–99)
Glucose-Capillary: 158 mg/dL — ABNORMAL HIGH (ref 70–99)
Glucose-Capillary: 187 mg/dL — ABNORMAL HIGH (ref 70–99)

## 2023-08-27 LAB — AMMONIA: Ammonia: 20 umol/L (ref 9–35)

## 2023-08-27 MED ORDER — TRAZODONE HCL 50 MG PO TABS
50.0000 mg | ORAL_TABLET | Freq: Every day | ORAL | Status: DC
  Administered 2023-08-27 – 2023-08-31 (×5): 50 mg via ORAL
  Filled 2023-08-27 (×5): qty 1

## 2023-08-27 MED ORDER — BUSPIRONE HCL 5 MG PO TABS
15.0000 mg | ORAL_TABLET | Freq: Two times a day (BID) | ORAL | Status: DC
  Administered 2023-08-27 – 2023-09-01 (×11): 15 mg via ORAL
  Filled 2023-08-27 (×11): qty 1

## 2023-08-27 MED ORDER — LAMOTRIGINE 25 MG PO TABS
25.0000 mg | ORAL_TABLET | Freq: Every day | ORAL | Status: DC
  Administered 2023-08-27 – 2023-09-01 (×6): 25 mg via ORAL
  Filled 2023-08-27 (×6): qty 1

## 2023-08-27 MED ORDER — LAMOTRIGINE 25 MG PO TABS: 25.0000 mg | ORAL_TABLET | Freq: Every day | ORAL | Status: DC

## 2023-08-27 MED ORDER — LAMOTRIGINE 25 MG PO TABS: 50.0000 mg | ORAL_TABLET | Freq: Every day | ORAL | Status: DC

## 2023-08-27 MED ORDER — LAMOTRIGINE 100 MG PO TABS: 100.0000 mg | ORAL_TABLET | Freq: Every day | ORAL | Status: DC

## 2023-08-27 NOTE — Consult Note (Addendum)
 [facility] Psychiatric Consult Initial  Patient Name: .Rodnisha Blomgren  MRN: [id number]  DOB: [birth year]  Consult Order details:  Orders (From admission, onward)     Start     Ordered   08/26/23 1528  IP CONSULT TO PSYCHIATRY       Ordering Provider: Fairy Frames, MD  Provider:  (Not yet assigned)  Question Answer Comment  Location [facility]   Reason for Consult? Psych meds, known bipolar disorder.. psychotropic meds held for encephalopathy and elevated ammonia      08/26/23 1528   08/12/23 0936  IP CONSULT TO PSYCHIATRY       Ordering Provider: Annella Donnice SAUNDERS, MD  Provider:  (Not yet assigned)  Question Answer Comment  Location [facility]   Reason for Consult? encephalopathy h/o bipolar, medication assistance      08/12/23 0936             Mode of Visit: In person    Psychiatry Consult Evaluation  Service Date: August 27, 2023 LOS:  LOS: 28 days  Chief Complaint: Metabolic encephalopathy, restarting psychotropic medications in setting of history of bipolar II disorder   Primary Psychiatric Diagnoses  Bipolar II Disorder Rule out Cluster B traits GAD 3.  History of seroquel  misuse  Assessment  Kaelen Brennan is a 63 y.o. female admitted: Medically for 07/30/2023  5:42 PM for acute metabolic encephalopathy following NSTEMI s/p cardiac catheterization, subsequent hospital course complicated by UTI. She carries the psychiatric diagnoses of bipolar II disorder, polysubstance use and has a past medical history of  CKD4, COPD, CAD, T2DM, HFrEF, hypothyroidism, and NSTEMI.   Her current presentation of altered mental status is most consistent with acute metabolic encephalopathy vs delirium. Regarding psychiatric presentation, she does not exhibit any symptoms concerning for manic or depressive episode.  On exam patient was calm and cooperative with orientation to self, fair attention, and impaired recent memory, with limited response to  questions. Current outpatient psychotropic medications include Buspar  15 mg BID, Lamictal  100 mg, and Trazodone  400 mg (recently on paliperidone but has not been prescribed since March 2025), and historically she has had a moderate response to these medications, per collateral with mother. It is currently unknown if she was compliant with medications prior to admission, though dispense records show they were regularly picked up.   Based on chart review, and previous psychiatric team's conversation with mother, she was diagnosed with Bipolar II at age 41. Within her lifetime she has had possible manic episodes since of increased impulsivity, lying, being found on top of roof, but none requiring hospitalization and no history of any auditory/visual hallucinations or paranoia. From visit with Greig Furlong at [facility] in 2017, appears to be some concern for cluster B personality traits with recommendation for DBT.  Also based on chart review, she does have history of depression with possible suicide attempt in 2016 through lithium overdose, and ED presentation for Seroquel  abuse. Patient denies any current thoughts of self-harm. She also has had history of possible parkinsonism with shuffling walk, reduced facial expression, and lack of arm swinging recently, possibly due to long term antipsychotic use, so we will defer restarting antipsychotic at this time and defer to her outpatient psychiatrist.   Family would strongly prefer restarting her home medications prior to discharge. Based on most recent labs showing mild increase in AST/ALT, normal bilirubin, platelets, low concern home medications would worsen hepatic function and feel safe restarting at this time. Will plan to slowly  titrate lamictal  since she has not been taking it for at least 1 month and to prevent risk of side effects such as SJS. Will also plan to start trazodone  at a lower dose compared to her home dose of 400 mg in setting of preventing additional  sedation, altered mental status, and since she had been sleeping well while in the hospital. We will defer to outpatient psychiatry for titrating trazodone  if indicated. She can follow-up with her outpatient psychiatric provider at [facility] upon discharge.  Please see plan below for detailed recommendations.   Diagnoses:  Active Hospital problems: Principal Problem:   Acute metabolic encephalopathy Active Problems:   CKD stage 3a, GFR 45-59 ml/min (HCC)   Malnutrition of moderate degree   Coronary artery disease   Hypothyroidism   Chronic anemia   Type 2 diabetes mellitus (HCC)    Plan   ## Psychiatric Medication Recommendations:  Will plan to restart home medications.  - Restart Buspar  15 mg BID for GAD - Restart lamictal  25 mg daily for 2 weeks, then increase to lamictal  50 mg daily for 2 weeks, then increase to lamictal  100 mg daily for bipolar depression - Restart trazodone  50 mg nightly for insomnia   ## Medical Decision Making Capacity: Not specifically addressed in this encounter  ## Further Work-up:  -- None indicated -- most recent EKG on 6/24 had QtC of 484 -- Pertinent labwork reviewed earlier this admission includes: CMP, CBC   ## Disposition:-- There are no psychiatric contraindications to discharge at this time. If she does not go to CIR, per patient mother she is planning to go to daughter's house where she will have supervision with no access to weapons. Recommend follow-up with her outpatient psychiatry provider at [facility] upon discharge.   ## Behavioral / Environmental: -Delirium Precautions: Delirium Interventions for Nursing and Staff: - RN to open blinds every AM. - To Bedside: Glasses, hearing aide, and pt's own shoes. Make available to patients. when possible and encourage use. - Encourage po fluids when appropriate, keep fluids within reach. - OOB to chair with meals. - Passive ROM exercises to all extremities with AM & PM care. - RN to  assess orientation to person, time and place QAM and PRN. - Recommend extended visitation hours with familiar family/friends as feasible. - Staff to minimize disturbances at night. Turn off television when pt asleep or when not in use.    ## Safety and Observation Level:  - Based on my clinical evaluation, I estimate the patient to be at low risk of self harm in the current setting. - At this time, we recommend  routine. This decision is based on my review of the chart including patient's history and current presentation, interview of the patient, mental status examination, and consideration of suicide risk including evaluating suicidal ideation, plan, intent, suicidal or self-harm behaviors, risk factors, and protective factors. This judgment is based on our ability to directly address suicide risk, implement suicide prevention strategies, and develop a safety plan while the patient is in the clinical setting. Please contact our team if there is a concern that risk level has changed.  CSSR Risk Category:C-SSRS RISK CATEGORY: No Risk  Suicide Risk Assessment: Patient has following modifiable risk factors for suicide: pain, medical illness (ie new dx of cancer) Patient has following non-modifiable or demographic risk factors for suicide: psychiatric hospitalization Patient has the following protective factors against suicide: Access to outpatient mental health care and Supportive family  Thank you for this  consult request. Recommendations have been communicated to the primary team.  We will sign off at this time.   Johnsie Ada, MS4  I personally was present and performed or re-performed the history, physical exam and medical decision-making activities of this service and have verified that the service and findings are accurately documented in the student's note.  Justino Cornish, MD PGY-2 Psychiatry Resident 08/27/2023, 12:54 PM         History of Present Illness  Relevant Aspects of [facility] Course:  Admitted on 07/30/2023 for NSTEMI, cardiac catheterization. Following this, she developed hyperammonemia and AMS. She was transferred to  ICU and intubated for acute respiratory failure, improved with transfer to the floor on 6/27. Mentation has been slowly improving, more recently developed a UTI on 7/2.  Patient Report:  Pt is oriented to self but not time, place, or situation. Attention is fair as evident by spelling world, makes eye contact with conversation but does not squeeze hand to letter A during SAVE A HAART. Recalls 3/3 objects immediately and 2/3 in 1 minute. Asked how apple and orange are similar and she says the taste similar and when asked how they are different says they taste different.   Pt is not able to provide significant hisotry due to her mentation.  Gave permission for us  to contact her family but does not provide any specific contacts.  Pt denies having any past psychiatric conditions but says yes when asked if she has bipolar disorder. Reports that she has mood swing. Regarding manic-like episodes reports that she has had recent episodes in which she spends a lot of money, though during interview tends to repeat the last few words of interviewer's question, so unclear if they are true responses. Regarding sleep, reports good. Does not describe suicidal thoughts, homicidal thoughts, or AVH. Reports that she does not know if she needs to restart medications. She is okay with restarting them if indicated.   Psych ROS:  Depression: Denies depressed mood, reports mood swings, does not describe suicidal thoughts Anxiety:  did not assess. Has anxiety and takes meds for anxiety per mother Mania (lifetime and current): Patient denies currently, reports previous episodes of spending a lot of money, mom reports prior episodes lasting about a week where patient was impulsive (for example climbing on roof)  Psychosis: (lifetime and current): Patient denies, mom  reports no psychotic episodes throughout lifetime  Sleep: Reports sleeping well Appetite: Reports good appetite   Collateral information:  Contacted [name] Dines (patient's mother) at [phone] on 08/27/23 Mom reports she has improved, but not at her baseline Still seems to be a little confused at times. Gets things wrong once in a while. Better than she was. But could be from her infection. Would like to restart her home medications. Can't remember the names, but knows 1 for sleep, 1 for anxiety, 1 for bipolar.   Mom reports not endorsing any hallucinations. She does get impulsive at times, and family questions whether taking medications. Reports she lies to them, has to go back and check whether she's taking medications. Says she had impulsive episodes frequently that would last about a week, she would be found on roof but those have stopped as she's gotten older.   Daughter is planning for her to come to her house. No access to weapons where she's going to be staying. Outpatient psychiatrist goes to [facility] over in [place].   From prior collateral conversation on 6/24: Contacted [name] Dines at 6/24 on 11 AM  outside of patient's room:   Per mother, patient's initial bipolar 2 diagnosis is from childhood -- patient used to be hyper and told untruths and acted out.  Does not remember period of time where for 4 more days, patient needed less sleep and had elevated and expansive mood.  Patient lives by herself.  Has been off of her long-acting injectable for 6 months.  If anything, patient has been like a robot.  Notes decrease in hand swing, shuffling steps and decreased facial movement.  For 3 years has been taking paliperidone long-acting injectable but, since stopping 6 months ago, has been spending more money.  Mother otherwise denies noticeable distractibility, irritability, grandiosity, flight of ideas, rapid speech.  Denies history of delusions and auditory and visual hallucinations  indicative of a primary psychotic disorder.  Denies history of suicidal ideation.  Endorses history of delirium when hospitalized in 2016 for lithium overdose, saying that patient saw a pink elephants.  Notes that patient was also confused and could not answer questions appropriately when she returned from [place] earlier this month AGAINST MEDICAL ADVICE.  Prior to that, patient was essentially at psychiatric/cognitive baseline and could hold conversations, lived by herself.    Review of Systems  Psychiatric/Behavioral:  Negative for depression, hallucinations and suicidal ideas. The patient does not have insomnia.      Psychiatric and Social History  Psychiatric History:  Information collected from patient, chart, mother  Prev Dx/Sx: Bipolar II, Polysubstance Use, depression, tardive dyskinesia Current Psych Provider: [facility] Home Meds (current): Lamictal  100 mg daily, Buspar  15 mg BID, Trazodone  400 mg nightly Previous Med Trials: Hydroxyzine  50 mg 3 times daily as needed, Seroquel  50 mg every morning and 300 mg nightly, Risperdal, paliperidone (recently discontinued, unclear why) lithium (caused nephrogenic DI, hypothyroidism)  Therapy: Unknown  Prior Psych Hospitalization: 'i dont know but none observed per chart review  Prior Self Harm: Denies, per chart review possible suicide attempt with lithium overdose in 2016 Prior Violence: Not assessed  Family Psych History: Not assessed Family Hx suicide: Not assessed  Social History:  Developmental Hx: did not discuss Educational Hx: did not discuss Occupational Hx: did not discuss Legal Hx: did not discuss Living Situation: lives alone, has dog who she does not remember name of Social support: family close by including two children Spiritual Hx: did not discuss Access to weapons/lethal means: pt denies   Substance History Alcohol:  not that much Tobacco: denies, per chart review 0.5 PPD for 30 years Illicit  drugs: denies, marijuana per chart review Prescription drug abuse: denies, seroquel  in past per chart review Rehab hx: n/a  Exam Findings   Vital Signs:  Temp:  [97.6 F (36.4 C)-98.2 F (36.8 C)] 97.6 F (36.4 C) (07/09 1245) Pulse Rate:  [64-78] 64 (07/09 1245) Resp:  [18] 18 (07/09 0841) BP: (109-113)/(64-78) 112/67 (07/09 1245) SpO2:  [96 %-98 %] 98 % (07/09 1245) Weight:  [52 kg] 52 kg (07/09 0500) Blood pressure 112/67, pulse 64, temperature 97.6 F (36.4 C), resp. rate 18, height 5' 5 (1.651 m), weight 52 kg, SpO2 98%. Body mass index is 19.08 kg/m.  Physical Exam Constitutional:      Appearance: She is not toxic-appearing.     Comments: Frail, appears older than stated age  HENT:     Head: Normocephalic and atraumatic.  Pulmonary:     Effort: Pulmonary effort is normal.  Neurological:     Mental Status: She is alert. She is disoriented.  Psychiatric:  Behavior: Behavior normal.     Mental Status Exam: General Appearance: Casual  Orientation:  Other:  Oriented to person, partially time (1st of month, not correct month or year) not oriented to place/event  Memory:  Immediate;   3/3 object recall Recent;   2/3 object recall Remote;   Poor  Concentration:  Concentration: Poor and Attention Span: Fair  Recall:  Poor  Attention  Fair  Eye Contact:  Good  Speech:  Clear and Coherent and Normal Rate  Language:  Good  Volume:  Normal  Mood: Not formerly assessed, denies depressed mood  Affect:  Flat  Thought Process:  NA, not clearly disorganized but responses to questions were limited with multiple instances of repeated interviewer's last words   Thought Content:  Logical, denies AVH   Suicidal Thoughts:  No  Homicidal Thoughts:  No  Judgement:  Impaired  Insight:  Lacking  Psychomotor Activity:  Normal  Akathisia:  No  Fund of Knowledge:  Fair, able to partly say how apple/orange are similar/different   Assets:  Social Support  Cognition:   Impaired,  Moderate  ADL's:  Impaired  AIMS (if indicated):        Other History   These have been pulled in through the EMR, reviewed, and updated if appropriate.  Family History:  The patient's family history includes Diabetes in her father.  Medical History: Past Medical History:  Diagnosis Date   Anxiety    Bipolar 2 disorder (HCC)    Chronic hepatitis C virus genotype 1 infection (HCC)    Chronic pancreatitis (HCC)    CKD (chronic kidney disease) stage 4, GFR 15-29 ml/min (HCC)    COPD (chronic obstructive pulmonary disease) (HCC)    Coronary artery disease 11/2019   s/p PTCI  LCx & RCA. Complicated by Vfib arrest   DM (diabetes mellitus), type 2 (HCC)    HFrEF (heart failure with reduced ejection fraction) (HCC) 08/01/2023   EF 45-50%   Hyperparathyroidism, secondary (HCC)    Hypothyroid    Lithium toxicity    Marijuana abuse    NSTEMI (non-ST elevated myocardial infarction) (HCC) 07/30/2023   med management   Peripheral autonomic neuropathy due to DM [facility])    STEMI (ST elevation myocardial infarction) (HCC) 2021   Stroke [facility])    right hemiparesis   Substance abuse (HCC)    Tardive dyskinesia    Tobacco abuse     Surgical History: Past Surgical History:  Procedure Laterality Date   CARDIAC CATHETERIZATION     CHOLECYSTECTOMY     LEFT HEART CATH AND CORONARY ANGIOGRAPHY N/A 07/31/2023   Patent LCx & RCA stents. Chronic occlussion mLAD with collaterals=med management     Medications:   Current Facility-Administered Medications:    acetaminophen  (TYLENOL ) tablet 650 mg, 650 mg, Oral, Q4H PRN, Arrien, Mauricio Daniel, MD, 650 mg at 08/25/23 2215   albuterol  (PROVENTIL ) (2.5 MG/3ML) 0.083% nebulizer solution 2.5 mg, 2.5 mg, Nebulization, Q4H PRN, Hunsucker, Donnice SAUNDERS, MD   arformoterol  (BROVANA ) nebulizer solution 15 mcg, 15 mcg, Nebulization, BID, Hunsucker, Donnice SAUNDERS, MD, 15 mcg at 08/27/23 0840   aspirin  chewable tablet 81 mg, 81 mg, Oral, Daily, Arrien,  Mauricio Daniel, MD, 81 mg at 08/27/23 0910   busPIRone  (BUSPAR ) tablet 15 mg, 15 mg, Oral, BID, Lakaisha Danish, MD   Chlorhexidine  Gluconate Cloth 2 % PADS 6 each, 6 each, Topical, Daily, Hunsucker, Donnice SAUNDERS, MD, 6 each at 08/26/23 1346   enoxaparin  (LOVENOX ) injection 40 mg, 40 mg, Subcutaneous,  QHS, Arrien, Mauricio Daniel, MD, 40 mg at 08/26/23 2221   feeding supplement (ENSURE PLUS HIGH PROTEIN) liquid 237 mL, 237 mL, Oral, BID BM, Arrien, Mauricio Daniel, MD, 237 mL at 08/26/23 1509   folic acid  (FOLVITE ) tablet 1 mg, 1 mg, Oral, Daily, Arrien, Mauricio Daniel, MD, 1 mg at 08/27/23 0909   insulin  aspart (novoLOG ) injection 0-9 Units, 0-9 Units, Subcutaneous, TID WC, Arrien, Mauricio Daniel, MD, 2 Units at 08/27/23 9089   insulin  aspart (novoLOG ) injection 5 Units, 5 Units, Subcutaneous, TID WC, Joseph, Preetha, MD, 5 Units at 08/27/23 0911   insulin  glargine-yfgn (SEMGLEE ) injection 12 Units, 12 Units, Subcutaneous, Daily, Fairy Frames, MD, 12 Units at 08/26/23 1025   lactulose  (CHRONULAC ) 10 GM/15ML solution 10 g, 10 g, Oral, BID, Fairy Frames, MD, 10 g at 08/27/23 0908   lamoTRIgine  (LAMICTAL ) tablet 25 mg, 25 mg, Oral, Daily **FOLLOWED BY** [START ON 09/10/2023] lamoTRIgine  (LAMICTAL ) tablet 50 mg, 50 mg, Oral, Daily **FOLLOWED BY** [START ON 09/24/2023] lamoTRIgine  (LAMICTAL ) tablet 100 mg, 100 mg, Oral, Daily, Cornelius, Greysyn Vanderberg, MD   levothyroxine  (SYNTHROID ) tablet 100 mcg, 100 mcg, Oral, q morning, Arrien, Mauricio Daniel, MD, 100 mcg at 08/27/23 9351   multivitamin with minerals tablet 1 tablet, 1 tablet, Oral, Daily, Arrien, Mauricio Daniel, MD, 1 tablet at 08/27/23 9090   nitroGLYCERIN  (NITROSTAT ) SL tablet 0.4 mg, 0.4 mg, Sublingual, Q5 Min x 3 PRN, Hunsucker, Donnice SAUNDERS, MD   ondansetron  (ZOFRAN ) injection 4 mg, 4 mg, Intravenous, Q6H PRN, Hunsucker, Donnice SAUNDERS, MD   Oral care mouth rinse, 15 mL, Mouth Rinse, PRN, Hunsucker, Donnice SAUNDERS, MD   Oral care mouth rinse, 15 mL, Mouth Rinse,  PRN, Hunsucker, Donnice SAUNDERS, MD   Oral care mouth rinse, 15 mL, Mouth Rinse, Q4H, Hunsucker, Donnice SAUNDERS, MD, 15 mL at 08/27/23 9082   revefenacin  (YUPELRI ) nebulizer solution 175 mcg, 175 mcg, Nebulization, Daily, Hunsucker, Donnice SAUNDERS, MD, 175 mcg at 08/27/23 0841   sodium chloride  flush (NS) 0.9 % injection 3 mL, 3 mL, Intravenous, PRN, Hunsucker, Donnice SAUNDERS, MD   tamsulosin  (FLOMAX ) capsule 0.4 mg, 0.4 mg, Oral, Daily, Fairy Frames, MD, 0.4 mg at 08/27/23 9090   thiamine  (VITAMIN B1) tablet 100 mg, 100 mg, Oral, Daily, Arrien, Mauricio Daniel, MD, 100 mg at 08/27/23 9090   traZODone  (DESYREL ) tablet 50 mg, 50 mg, Oral, QHS, Aleiah Mohammed, MD  Allergies: Allergies  Allergen Reactions   Lithium Anaphylaxis   Sulfa Antibiotics Rash   Codeine Rash    Johnsie Ada MS4  I personally was present and performed or re-performed the history, physical exam and medical decision-making activities of this service and have verified that the service and findings are accurately documented in the student's note.  Justino Cornelius, MD PGY-2 Psychiatry Resident 08/27/2023, 12:54 PM

## 2023-08-27 NOTE — Progress Notes (Signed)
 PROGRESS NOTE    Sarah Davis  FMW:[id number] DOB: [birth year] DOA: 07/30/2023 PCP: Patient, No Pcp Per   Brief Narrative:  Sarah Davis was transferred from [facility] hospital to [facility] for cardiac catheterization.  62/F w history of bipolar, CKD, COPD, coronary artery disease, history of CVA, neuropathy and hypothyroidism, who was admitted to [facility] 07/28/23 with the working diagnosis of acute metabolic encephalopathy due to hypoglycemia.Treated with D10 drip.   She was found to have NSTEMI, prompting her transfer. Subsequently underwent cardiac catheterization 07/31/2023 noted to have chronic occlusion of mid LAD, medical management recommended. Post procedure she had worsening mentation, and  acute respiratory failure. Required intubation emergently. She transferred to the intensive care unit.  6/12 - CT negative for LVO, MRI negative for acute findings, advanced chronic microvascular ischemia,  no seizures on EEG.  6/14 remains somnolent.  6/16 unresponsive, ammonia back in 250 range; started carnitine, increased lactulose  6/17 ammonia back in 70s, still minimally responsive, remaining off sedation 6/21 LFTs elevated 6/23 no improvement in mental status, MRI ok, carnitine stopped as rising LFTs, possible DILI 6/24 spoke 1-2 words 6/25 speaking in phrases 6/26 held brief conversation -6/27, transferred from ICU to [facility] service   -6/28 slowly improving mentation, tolerating well tube feedings.  -6/29 mentation continues to improve, per her family at the bedside she is not yet back to her baseline.  -6/30 advanced diet, with improvement in swallow dysfunction.  -7/01 Cortrak removed.  -7/2: WBC upto 18K, foley removed, UA suggestive of infection> started ceftriaxone  - 7/4, recurrent retention, Foley replaced - 7/6, urine culture growing Klebsiella, mental status improving - 7/6 urine culture growing ESBL Klebsiella, ceftriaxone  changed to meropenem   Assessment & Plan:   Principal  Problem:   Acute metabolic encephalopathy Active Problems:   Coronary artery disease   CKD stage 3a, GFR 45-59 ml/min (HCC)   Malnutrition of moderate degree   Hypothyroidism   Chronic anemia   Type 2 diabetes mellitus (HCC)  Acute metabolic encephalopathy - Multifactorial, likely related to hyperammonemia, severe hypoglycemia on original admission.  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  - Discussed case with psych, current medications likely not planning a role in her hyperammonemia, resumed per their recommendations  - Lamictal  25 mg daily -increase every 2 weeks as tolerated/for symptom management  - Trazodone  50 mg *low threshold to discontinue if mental status changes* - Cortrak removed, tolerating dysphagia diet - Discharge planning, TOC following, CIR evaluating   Leukocytosis Catheter related UTI Urinary retention - Catheter originally placed for retention on 6/29, noted to have some dysuria, foul smelling urine and worsening leukocytosis 7/2, UA abnormal - Initially treated with IV ceftriaxone , urine culture with 100,000 colonies of ESBL Klebsiella, transitioned to 5 days of imipenem - stop date 08/28/23 -Recurrent episode of retention -> Foley replaced 7/3, routine catheter care -Consider voiding trial once UTI treatment completed   Acute on chronic systolic and diastolic CHF - Echocardiogram - EF 45 to 50%, mild LVH, RV systolic function preserved, small pericardial effusion, mild aortic stenosis; LV hypokinetic apical septal segment, apical anterior segment, apical inferior segment, and apex.  - Now appears euvolemic(off diuretics), GDMT limited by hypotension - Monitor off diuretics for now, add PRN at DC   Coronary artery disease NSTEMI with chronic occlusion of LAD.  Continue aspirin .,  Resume statin after discharge    CKD stage 3a, GFR 45-59 ml/min (HCC) AKI, hypernatremia,,  hypokalemia.  - AKI has resolved   Malnutrition of moderate degree Continue nutritional supplements.    Hypothyroidism Continue levothyroxine     Chronic anemia Stable, monitor   Type 2 diabetes mellitus (HCC) CBGs are stable, continue glargine and SSI   DVT prophylaxis: enoxaparin  (LOVENOX ) injection 40 mg Start: 08/19/23 2200 SCD's Start: 07/31/23 1526 Code Status:   Code Status: Full Code Family Communication: None present  Status is: Inpatient  Dispo: The patient is from: Home              Anticipated d/c is to: To be determined              Anticipated d/c date is: To be determined              Patient currently is medically stable for discharge  Consultants:  Psych, cardiology  Procedures:  Intubation/extubation, EEG, echo  Antimicrobials:  Imipenem stop date 08/28/2023  Subjective: No acute issues or events overnight  Objective: Vitals:   08/26/23 1608 08/26/23 2006 08/26/23 2053 08/27/23 0500  BP: 113/64  109/78   Pulse: 69  78   Resp:   18   Temp:   98.2 F (36.8 C)   TempSrc:   Oral   SpO2: 98% 97% 97%   Weight:    52 kg  Height:        Intake/Output Summary (Last 24 hours) at 08/27/2023 0804 Last data filed at 08/26/2023 2225 Gross per 24 hour  Intake 120 ml  Output 1800 ml  Net -1680 ml   Filed Weights   08/25/23 0438 08/26/23 0500 08/27/23 0500  Weight: 53.6 kg 53.6 kg 52 kg    Examination:  General:  Pleasantly resting in bed, No acute distress. HEENT:  Normocephalic atraumatic.  Sclerae nonicteric, noninjected.  Extraocular movements intact bilaterally. Neck:  Without mass or deformity.  Trachea is midline. Lungs:  Clear to auscultate bilaterally without rhonchi, wheeze, or rales. Heart:  Regular rate and rhythm.  Without murmurs, rubs, or gallops. Abdomen:  Soft, nontender, nondistended.  Without guarding or rebound. Extremities: Without cyanosis, clubbing, edema, or obvious deformity. Skin:  Warm and dry  Data Reviewed: I have  personally reviewed following labs and imaging studies  CBC: Recent Labs  Lab 08/21/23 0901 08/23/23 0531 08/24/23 0313 08/26/23 0419 08/27/23 0236  WBC 14.5* 11.4* 11.2* 9.5 10.8*  HGB 8.8* 8.7* 9.7* 9.8* 10.2*  HCT 28.8* 28.4* 31.7* 31.9* 33.9*  MCV 87.5 87.7 87.3 87.6 87.8  PLT 458* 434* 431* 403* 330   Basic Metabolic Panel: Recent Labs  Lab 08/22/23 0228 08/23/23 0531 08/24/23 0313 08/25/23 0415 08/26/23 0419  NA 137 137 136 137 138  K 3.8 3.8 4.3 4.1 4.1  CL 103 103 102 102 103  CO2 24 24 25 25 27   GLUCOSE 150* 170* 190* 154* 124*  BUN 29* 27* 22 22 27*  CREATININE 0.97 0.80 0.78 0.88 0.82  CALCIUM  8.7* 8.8* 8.8* 9.0 9.2   GFR: Estimated Creatinine Clearance: 57.6 mL/min (by C-G formula based on SCr of 0.82 mg/dL).  Recent Labs  Lab 08/22/23 0228 08/24/23 0313  AMMONIA 46* 15   CBG: Recent Labs  Lab 08/26/23 0752 08/26/23 1208 08/26/23 1608 08/27/23 0007 08/27/23 0628  GLUCAP 175* 145* 140* 135* 158*    Recent Results (from the past 240 hours)  Urine Culture     Status: Abnormal   Collection Time: 08/21/23  7:09 AM   Specimen: Urine, Clean Catch  Result Value Ref Range Status  Specimen Description URINE, CLEAN CATCH  Final   Special Requests   Final    NONE Performed at [facility], [address], [place], [place] [address]    Culture (A)  Final    >=100,000 COLONIES/mL KLEBSIELLA PNEUMONIAE Confirmed Extended Spectrum Beta-Lactamase Producer (ESBL).  In bloodstream infections from ESBL organisms, carbapenems are preferred over piperacillin/tazobactam. They are shown to have a lower risk of mortality.    Report Status 08/24/2023 FINAL  Final   Organism ID, Bacteria KLEBSIELLA PNEUMONIAE (A)  Final      Susceptibility   Klebsiella pneumoniae - MIC*    AMPICILLIN >=32 RESISTANT Resistant     CEFAZOLIN >=64 RESISTANT Resistant     CEFEPIME >=32 RESISTANT Resistant     CEFTRIAXONE  >=64 RESISTANT Resistant     CIPROFLOXACIN 1  RESISTANT Resistant     GENTAMICIN <=1 SENSITIVE Sensitive     IMIPENEM 0.5 SENSITIVE Sensitive     NITROFURANTOIN 128 RESISTANT Resistant     TRIMETH/SULFA >=320 RESISTANT Resistant     AMPICILLIN/SULBACTAM >=32 RESISTANT Resistant     PIP/TAZO 64 INTERMEDIATE Intermediate ug/mL    * >=100,000 COLONIES/mL KLEBSIELLA PNEUMONIAE         Radiology Studies: No results found.      Scheduled Meds:  arformoterol   15 mcg Nebulization BID   aspirin   81 mg Oral Daily   Chlorhexidine  Gluconate Cloth  6 each Topical Daily   enoxaparin  (LOVENOX ) injection  40 mg Subcutaneous QHS   feeding supplement  237 mL Oral BID BM   folic acid   1 mg Oral Daily   insulin  aspart  0-9 Units Subcutaneous TID WC   insulin  aspart  5 Units Subcutaneous TID WC   insulin  glargine-yfgn  12 Units Subcutaneous Daily   lactulose   10 g Oral BID   levothyroxine   100 mcg Oral q morning   multivitamin with minerals  1 tablet Oral Daily   mouth rinse  15 mL Mouth Rinse Q4H   revefenacin   175 mcg Nebulization Daily   tamsulosin   0.4 mg Oral Daily   thiamine   100 mg Oral Daily   Continuous Infusions:   LOS: 28 days   Time spent:  Elsie JAYSON Montclair, DO [facility]  If 7PM-7AM, please contact night-coverage [email or web address]  08/27/2023, 8:04 AM

## 2023-08-27 NOTE — Progress Notes (Signed)
 Speech Language Pathology Treatment: Dysphagia  Patient Details Name: Sarah Davis MRN: [id number] DOB: 1960-03-04 Today's Date: 08/27/2023 Time: 1350-1400 SLP Time Calculation (min) (ACUTE ONLY): 10 min  Assessment / Plan / Recommendation Clinical Impression  Skilled therapy session focused on dysphagia goals. SLP facilitated session by observing patient with consumption of regular solids and thin liquids via straw. Patient continues to demonstrate impulsivity with PO, attempting to take large bites/sips, though redirected given minA. Patient with timely mastication of solids and complete oral clearance. No s/sx of aspiration throughout. Recommend continuation of current diet with medications whole in puree.   HPI HPI: 63 year old female admitted from [facility] for cardiac cath 07/31/2023 without acute interventions required. MS changes post-procedure, somnolence and resp failure requiring emergent intubation 6/13-20. Dx acute metabolic encephalopathy, hyperammonemia, AKI, CAD with acute NSTEMI. PMHx stroke 2021, polysubstance abuse, tobacco abuse, bipolar, thyroid disease. MBS rec D2/thin      SLP Plan  Continue with current plan of care      Recommendations  Diet recommendations: Dysphagia 3 (mechanical soft);Thin liquid Liquids provided via: Cup;Straw Medication Administration: Whole meds with puree Supervision: Staff to assist with self feeding Compensations: Slow rate;Small sips/bites;Minimize environmental distractions Postural Changes and/or Swallow Maneuvers: Seated upright 90 degrees        Oral care BID   Frequent or constant Supervision/Assistance Dysphagia, oropharyngeal phase (R13.12)     Continue with current plan of care    Choua Ikner M.A., CCC-SLP 08/27/2023, 2:02 PM

## 2023-08-27 NOTE — Progress Notes (Signed)
 Physical Therapy Treatment Patient Details Name: Sarah Davis MRN: [id number] DOB: 02-18-1961 Today's Date: 08/27/2023   History of Present Illness 63 yo female admitted 07/30/23. 6/12 cardiac cath with somnolence and respiratory failure post procedure requiring intubation 6/13-6/20. NSTEMI, encephalopathy and AKI impacting course. PMhx: polysubstance abuse, bipolar disorder, thyroid disease, hep C, COPD, DM, HFrEF, CVA with Rt hemiparesis    PT Comments  Patient with progress despite limited session with fecal incontinence.  She admitted fearful in standing though took two steps forward while holding to b/s table.  Likely could progress with Elyn walker.  She has flexion contractures in hips, though stood much straighter first standing attempt.  PT will continue to follow.  Recommend intensive inpatient rehab prior to d/c home with family support.     If plan is discharge home, recommend the following: Two people to help with walking and/or transfers;Two people to help with bathing/dressing/bathroom;Supervision due to cognitive status;Assist for transportation;Help with stairs or ramp for entrance   Can travel by private vehicle        Equipment Recommendations  Hospital bed;Wheelchair cushion (measurements PT);Wheelchair (measurements PT)    Recommendations for Other Services       Precautions / Restrictions Precautions Precautions: Fall Recall of Precautions/Restrictions: Impaired Precaution/Restrictions Comments: fecal incontinence     Mobility  Bed Mobility Overal bed mobility: Needs Assistance Bed Mobility: Rolling, Sidelying to Sit Rolling: Min assist, Used rails, Mod assist Sidelying to sit: Mod assist, +2 for safety/equipment   Sit to supine: Max assist, +2 for physical assistance   General bed mobility comments: rolling in bed for hygiene, pt able to roll with cues and time, though needs A for further positioning for hygiene efforts; side to sit A for legs and trunk; to  supine A for most aspects due to soiled again from BM after standing trials    Transfers Overall transfer level: Needs assistance Equipment used: 2 person hand held assist Transfers: Sit to/from Stand Sit to Stand: Mod assist, +2 physical assistance           General transfer comment: stood x 2 from EOB with A for anterior weight shift and balance with pt reports fear of falling; used b/s table and pt's elbows on table second stand for improved support    Ambulation/Gait Ambulation/Gait assistance: Mod assist, Max assist, +2 physical assistance Gait Distance (Feet): 1 Feet Assistive device:  (hands on bedside table) Gait Pattern/deviations: Decreased dorsiflexion - right, Decreased dorsiflexion - left, Step-to pattern, Shuffle, Trunk flexed       General Gait Details: flexed with arms on bedside table. took 2 steps prior to needing to sit with mod cues for hip extension and max A for balance/safety, limited due to continued fecal incontinence   Stairs             Wheelchair Mobility     Tilt Bed    Modified Rankin (Stroke Patients Only)       Balance Overall balance assessment: Needs assistance   Sitting balance-Leahy Scale: Poor Sitting balance - Comments: can sit for a few minutes with CGA to S then one episode pushing back needing mod to max A to recover   Standing balance support: Bilateral upper extremity supported Standing balance-Leahy Scale: Zero Standing balance comment: mod to max A standing with UE support                            Communication Communication Communication: Impaired Factors  Affecting Communication: Difficulty expressing self (limited verbalizations)  Cognition Arousal: Alert Behavior During Therapy: Flat affect   PT - Cognitive impairments: Attention, Initiation, Problem solving, Awareness                       PT - Cognition Comments: denied having BM though was covered with soft stool from back to  front upon PT entry Following commands: Impaired Following commands impaired: Follows one step commands inconsistently, Follows one step commands with increased time    Cueing Cueing Techniques: Verbal cues, Visual cues, Tactile cues  Exercises      General Comments General comments (skin integrity, edema, etc.): applied sacral pad after hygiene due to breakdown on buttocks      Pertinent Vitals/Pain Pain Assessment Pain Assessment: No/denies pain Faces Pain Scale: No hurt    Home Living                          Prior Function            PT Goals (current goals can now be found in the care plan section) Progress towards PT goals: Progressing toward goals    Frequency    Min 2X/week      PT Plan      Co-evaluation              AM-PAC PT 6 Clicks Mobility   Outcome Measure  Help needed turning from your back to your side while in a flat bed without using bedrails?: A Lot Help needed moving from lying on your back to sitting on the side of a flat bed without using bedrails?: Total Help needed moving to and from a bed to a chair (including a wheelchair)?: Total Help needed standing up from a chair using your arms (e.g., wheelchair or bedside chair)?: Total Help needed to walk in hospital room?: Total Help needed climbing 3-5 steps with a railing? : Total 6 Click Score: 7    End of Session Equipment Utilized During Treatment: Gait belt Activity Tolerance: Patient limited by fatigue Patient left: in bed;with call bell/phone within reach   PT Visit Diagnosis: Other abnormalities of gait and mobility (R26.89);Difficulty in walking, not elsewhere classified (R26.2);Other symptoms and signs involving the nervous system (R29.898)     Time: 8499-8455 PT Time Calculation (min) (ACUTE ONLY): 44 min  Charges:    $Therapeutic Activity: 38-52 mins PT General Charges $$ ACUTE PT VISIT: 1 Visit                     [name], PT Acute  Rehabilitation Services [phone] 08/27/2023    [name] 08/27/2023, 6:30 PM

## 2023-08-27 NOTE — Progress Notes (Addendum)
  Inpatient Rehabilitation Admissions Coordinator   Met with patient and Mother at bedside for rehab assessment. We discussed goals and expectations of a possible CIR admit. They prefer CIR for rehab. Patient plans to discharge home with her daughter, August, who works from home and can provide 24/7 assist. I discussed limitations with [employer] as a payor, that goal would have to be discharged home .No SNF options at this time. I will discuss with our Rehab MD and follow up tomorrow.   Please call me with any questions.   Heron Leavell, RN, MSN Rehab Admissions Coordinator [phone]

## 2023-08-28 DIAGNOSIS — G7281 Critical illness myopathy: Secondary | ICD-10-CM

## 2023-08-28 DIAGNOSIS — G9341 Metabolic encephalopathy: Secondary | ICD-10-CM | POA: Diagnosis not present

## 2023-08-28 DIAGNOSIS — R339 Retention of urine, unspecified: Secondary | ICD-10-CM | POA: Diagnosis not present

## 2023-08-28 LAB — GLUCOSE, CAPILLARY
Glucose-Capillary: 160 mg/dL — ABNORMAL HIGH (ref 70–99)
Glucose-Capillary: 161 mg/dL — ABNORMAL HIGH (ref 70–99)
Glucose-Capillary: 127 mg/dL — ABNORMAL HIGH (ref 70–99)

## 2023-08-28 LAB — CBC
HCT: 33.5 % — ABNORMAL LOW (ref 36.0–46.0)
Hemoglobin: 10.3 g/dL — ABNORMAL LOW (ref 12.0–15.0)
MCH: 26.6 pg (ref 26.0–34.0)
MCHC: 30.7 g/dL (ref 30.0–36.0)
MCV: 86.6 fL (ref 80.0–100.0)
Platelets: 344 K/uL (ref 150–400)
RBC: 3.87 MIL/uL (ref 3.87–5.11)
RDW: 19 % — ABNORMAL HIGH (ref 11.5–15.5)
WBC: 9.1 K/uL (ref 4.0–10.5)
nRBC: 0 % (ref 0.0–0.2)

## 2023-08-28 NOTE — Progress Notes (Signed)
 PROGRESS NOTE    Sarah Davis  FMW:[id number] DOB: Jul 13, 1960 DOA: 07/30/2023 PCP: Patient, No Pcp Per   Brief Narrative:  Sarah Davis was transferred from [facility] hospital to [facility] for cardiac catheterization.  62/F w history of bipolar, CKD, COPD, coronary artery disease, history of CVA, neuropathy and hypothyroidism, who was admitted to [facility] 07/28/23 with the working diagnosis of acute metabolic encephalopathy due to hypoglycemia.Treated with D10 drip.   She was found to have NSTEMI, prompting her transfer. Subsequently underwent cardiac catheterization 07/31/2023 noted to have chronic occlusion of mid LAD, medical management recommended. Post procedure she had worsening mentation, and  acute respiratory failure. Required intubation emergently. She transferred to the intensive care unit.  6/12 - CT negative for LVO, MRI negative for acute findings, advanced chronic microvascular ischemia,  no seizures on EEG.  6/14 remains somnolent.  6/16 unresponsive, ammonia back in 250 range; started carnitine, increased lactulose  6/17 ammonia back in 70s, still minimally responsive, remaining off sedation 6/21 LFTs elevated 6/23 no improvement in mental status, MRI ok, carnitine stopped as rising LFTs, possible DILI 6/24 spoke 1-2 words 6/25 speaking in phrases 6/26 held brief conversation -6/27, transferred from ICU to [facility] service   -6/28 slowly improving mentation, tolerating well tube feedings.  -6/29 mentation continues to improve, per her family at the bedside she is not yet back to her baseline.  -6/30 advanced diet, with improvement in swallow dysfunction.  -7/01 Cortrak removed.  -7/2: WBC upto 18K, foley removed, UA suggestive of infection> started ceftriaxone  - 7/4, recurrent retention, Foley replaced - 7/6, urine culture growing Klebsiella, mental status improving - 7/6 urine culture growing ESBL Klebsiella, ceftriaxone  changed to meropenem   Assessment & Plan:   Principal  Problem:   Acute metabolic encephalopathy Active Problems:   Coronary artery disease   CKD stage 3a, GFR 45-59 ml/min (HCC)   Malnutrition of moderate degree   Hypothyroidism   Chronic anemia   Type 2 diabetes mellitus (HCC)  Acute metabolic encephalopathy - Multifactorial, likely related to hyperammonemia, severe hypoglycemia on original admission.  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  - Discussed case with psych, current medications likely not planning a role in her hyperammonemia, resumed per their recommendations  - Lamictal  25 mg daily -increase every 2 weeks as tolerated/for symptom management  - Trazodone  50 mg *low threshold to discontinue if mental status changes* - Cortrak removed, tolerating dysphagia diet - Discharge planning, TOC following, CIR evaluating   Leukocytosis Catheter related UTI Urinary retention - Catheter originally placed for retention on 6/29, noted to have some dysuria, foul smelling urine and worsening leukocytosis 7/2, UA abnormal - Initially treated with IV ceftriaxone , urine culture with 100,000 colonies of ESBL Klebsiella, transitioned to 5 days of imipenem - stop date 08/28/23 -Recurrent episode of retention -> Foley replaced 7/3, routine catheter care -Consider voiding trial once UTI treatment completed   Acute on chronic systolic and diastolic CHF - Echocardiogram - EF 45 to 50%, mild LVH, RV systolic function preserved, small pericardial effusion, mild aortic stenosis; LV hypokinetic apical septal segment, apical anterior segment, apical inferior segment, and apex.  - Now appears euvolemic(off diuretics), GDMT limited by hypotension - Monitor off diuretics for now, add PRN at DC   Coronary artery disease NSTEMI with chronic occlusion of LAD.  Continue aspirin .,  Resume statin after discharge    CKD stage 3a, GFR 45-59 ml/min (HCC) AKI, hypernatremia,,  hypokalemia.  - AKI has resolved   Malnutrition of moderate degree Continue nutritional supplements.    Hypothyroidism Continue levothyroxine     Chronic anemia Stable, monitor   Type 2 diabetes mellitus (HCC) CBGs are stable, continue glargine and SSI   DVT prophylaxis: enoxaparin  (LOVENOX ) injection 40 mg Start: 08/19/23 2200 SCD's Start: 07/31/23 1526 Code Status:   Code Status: Full Code Family Communication: None present  Status is: Inpatient  Dispo: The patient is from: Home              Anticipated d/c is to: To be determined              Anticipated d/c date is: To be determined              Patient currently is medically stable for discharge  Consultants:  Psych, cardiology  Procedures:  Intubation/extubation, EEG, echo  Antimicrobials:  Imipenem stop date 08/28/2023  Subjective: No acute issues or events overnight  Objective: Vitals:   08/27/23 2008 08/27/23 2056 08/28/23 0022 08/28/23 0508  BP:  117/78 (!) 115/53 99/60  Pulse: 76 61 63 64  Resp: 17 18 18 18   Temp:  98.9 F (37.2 C) (!) 97.5 F (36.4 C) 98.6 F (37 C)  TempSrc:  Oral Oral Oral  SpO2: 96% 98% 100% 96%  Weight:      Height:        Intake/Output Summary (Last 24 hours) at 08/28/2023 0802 Last data filed at 08/28/2023 0500 Gross per 24 hour  Intake 518 ml  Output 300 ml  Net 218 ml   Filed Weights   08/25/23 0438 08/26/23 0500 08/27/23 0500  Weight: 53.6 kg 53.6 kg 52 kg    Examination:  General:  Pleasantly resting in bed, No acute distress. HEENT:  Normocephalic atraumatic.  Sclerae nonicteric, noninjected.  Extraocular movements intact bilaterally. Neck:  Without mass or deformity.  Trachea is midline. Lungs:  Clear to auscultate bilaterally without rhonchi, wheeze, or rales. Heart:  Regular rate and rhythm.  Without murmurs, rubs, or gallops. Abdomen:  Soft, nontender, nondistended.  Without guarding or rebound. Extremities: Without cyanosis, clubbing, edema, or  obvious deformity. Skin:  Warm and dry  Data Reviewed: I have personally reviewed following labs and imaging studies  CBC: Recent Labs  Lab 08/23/23 0531 08/24/23 0313 08/26/23 0419 08/27/23 0236 08/28/23 0504  WBC 11.4* 11.2* 9.5 10.8* 9.1  HGB 8.7* 9.7* 9.8* 10.2* 10.3*  HCT 28.4* 31.7* 31.9* 33.9* 33.5*  MCV 87.7 87.3 87.6 87.8 86.6  PLT 434* 431* 403* 330 344   Basic Metabolic Panel: Recent Labs  Lab 08/23/23 0531 08/24/23 0313 08/25/23 0415 08/26/23 0419 08/27/23 1441  NA 137 136 137 138 136  K 3.8 4.3 4.1 4.1 4.0  CL 103 102 102 103 96*  CO2 24 25 25 27 27   GLUCOSE 170* 190* 154* 124* 221*  BUN 27* 22 22 27* 23  CREATININE 0.80 0.78 0.88 0.82 0.95  CALCIUM  8.8* 8.8* 9.0 9.2 9.4   GFR: Estimated Creatinine Clearance: 49.8 mL/min (by C-G formula based on SCr of 0.95 mg/dL).  Recent Labs  Lab 08/22/23 0228 08/24/23 0313 08/27/23 0808  AMMONIA 46* 15 20   CBG: Recent Labs  Lab 08/27/23 0628 08/27/23 0824 08/27/23 1244 08/27/23 1618 08/27/23 2058  GLUCAP 158* 158* 146* 187* 132*    Recent Results (from the past 240 hours)  Urine Culture     Status: Abnormal   Collection Time: 08/21/23  7:09 AM  Specimen: Urine, Clean Catch  Result Value Ref Range Status   Specimen Description URINE, CLEAN CATCH  Final   Special Requests   Final    NONE Performed at [facility], [address] [place], [place] [address]    Culture (A)  Final    >=100,000 COLONIES/mL KLEBSIELLA PNEUMONIAE Confirmed Extended Spectrum Beta-Lactamase Producer (ESBL).  In bloodstream infections from ESBL organisms, carbapenems are preferred over piperacillin/tazobactam. They are shown to have a lower risk of mortality.    Report Status 08/24/2023 FINAL  Final   Organism ID, Bacteria KLEBSIELLA PNEUMONIAE (A)  Final      Susceptibility   Klebsiella pneumoniae - MIC*    AMPICILLIN >=32 RESISTANT Resistant     CEFAZOLIN >=64 RESISTANT Resistant     CEFEPIME >=32 RESISTANT  Resistant     CEFTRIAXONE  >=64 RESISTANT Resistant     CIPROFLOXACIN 1 RESISTANT Resistant     GENTAMICIN <=1 SENSITIVE Sensitive     IMIPENEM 0.5 SENSITIVE Sensitive     NITROFURANTOIN 128 RESISTANT Resistant     TRIMETH/SULFA >=320 RESISTANT Resistant     AMPICILLIN/SULBACTAM >=32 RESISTANT Resistant     PIP/TAZO 64 INTERMEDIATE Intermediate ug/mL    * >=100,000 COLONIES/mL KLEBSIELLA PNEUMONIAE         Radiology Studies: No results found.      Scheduled Meds:  arformoterol   15 mcg Nebulization BID   aspirin   81 mg Oral Daily   busPIRone   15 mg Oral BID   Chlorhexidine  Gluconate Cloth  6 each Topical Daily   enoxaparin  (LOVENOX ) injection  40 mg Subcutaneous QHS   feeding supplement  237 mL Oral BID BM   folic acid   1 mg Oral Daily   insulin  aspart  0-9 Units Subcutaneous TID WC   insulin  aspart  5 Units Subcutaneous TID WC   insulin  glargine-yfgn  12 Units Subcutaneous Daily   lactulose   10 g Oral BID   lamoTRIgine   25 mg Oral Daily   Followed by   NOREEN ON 09/10/2023] lamoTRIgine   50 mg Oral Daily   Followed by   NOREEN ON 09/24/2023] lamoTRIgine   100 mg Oral Daily   levothyroxine   100 mcg Oral q morning   multivitamin with minerals  1 tablet Oral Daily   mouth rinse  15 mL Mouth Rinse Q4H   revefenacin   175 mcg Nebulization Daily   tamsulosin   0.4 mg Oral Daily   thiamine   100 mg Oral Daily   traZODone   50 mg Oral QHS   Continuous Infusions:   LOS: 29 days   Time spent:  Elsie JAYSON Montclair, DO [facility]  If 7PM-7AM, please contact night-coverage [email or web address]  08/28/2023, 8:02 AM

## 2023-08-28 NOTE — Progress Notes (Signed)
 Nutrition Follow-up  DOCUMENTATION CODES:   Non-severe (moderate) malnutrition in context of chronic illness  INTERVENTION:   Continue current diet as ordered Ordering assistance Encourage PO intake Assist with feeding pt  Continue the following replacements: Vitamin B6 (100mg  x 14 days), Zinc  Sulfate (220mg  x 14 days), Folic Acid  (1mg  x 30 days) Continue MVI with minerals 1x/d Continue Ensure Plus High Protein po BID, each supplement provides 350 kcal and 20 grams of protein   NUTRITION DIAGNOSIS:   Moderate Malnutrition related to chronic illness as evidenced by severe muscle depletion, moderate fat depletion. - Still applicable   GOAL:   Patient will meet greater than or equal to 90% of their needs - Progressing   MONITOR:   TF tolerance, Skin, I & O's, Vent status, Labs  REASON FOR ASSESSMENT:   Consult Enteral/tube feeding initiation and management  ASSESSMENT:   Pt with hx of CKD3, COPD, CAD, hx CVA, and DM type 2 presented to ED from [facility] for cardiac cath.  6/11 - admitted 6/13 - intubated 6/20 - extubated 6/26, 6/27 - BSE, NPO 6/30 - MBS, DYS2/thin liquids 7/1 - Cortrak removed  7/6- urine culture shows Klebsiella infection, started on meropenem  7/8- advanced to DYS 3/thin 7/9- FMS removed  Pt continues to work with SLP, will monitor any diet advancement recommendations and progression. Pt being assessed for CIR placement, otherwise pt will be discharged to home. Psychiatry consult complete.   Spoke with pt and pt's mom who was at bedside. [name] reported that pt eats well when she has assistance at meals but when she isn't there to feed pt, pt's intake is very poor. [name] reports she is typically with pt for lunch and dinner and is sure to order all meals for pt if mom isn't around. Discussed with RN/tech to help with meals and encourage intake. [name] reports pt drank 1 whole Ensure yesterday 7/9. Appears there has been slight  improvements in intake since last nutrition assessment, but overall intake < 50% of estimated needs. Encouraged mom to continue to advocate for pt and cotninue to order meals that she knows pt will enjoy.   Admit weight: 55.1 kg  Current weight: 52 kg   Average Meal Intake: 7/2-7/2: 20-45%% intake x 5 recorded meals 7/4-7/10: 10-75% intake x 6 recorded meals   Nutritionally Relevant Medications: Folic acid  SSI Novolog  TID Novolog  5 units TID w/ meals Semglee  12 units daily Lactulose  BID Levothyroxine  MVI w/ minerals daily Thiamine  100mg  daily  Labs Reviewed: CBG ranges from 132-187 mg/dL over the last 24 hours HgbA1c 6  Diet Order:   Diet Order             DIET DYS 3 Room service appropriate? Yes; Fluid consistency: Thin  Diet effective now                   EDUCATION NEEDS:   Not appropriate for education at this time  Skin:  Skin Assessment: Skin Integrity Issues: Skin Integrity Issues:: DTI DTI: Sacrum  Last BM:  7/9 type 4  Height:   Ht Readings from Last 1 Encounters:  07/30/23 5' 5 (1.651 m)   Weight:   Wt Readings from Last 1 Encounters:  08/27/23 52 kg    Ideal Body Weight:  56.8 kg  BMI:  Body mass index is 19.08 kg/m.  Estimated Nutritional Needs:   Kcal:  1500-1700 kcal/d  Protein:  75-90g/d  Fluid:  >1.5L/d   Josette Glance, MS, RDN,  LDN Clinical Dietitian I Please reach out via secure chat

## 2023-08-28 NOTE — Plan of Care (Signed)
  Problem: Education: Goal: Knowledge of General Education information will improve Description: Including pain rating scale, medication(s)/side effects and non-pharmacologic comfort measures Outcome: Progressing   Problem: Clinical Measurements: Goal: Respiratory complications will improve Outcome: Progressing Goal: Cardiovascular complication will be avoided Outcome: Progressing   Problem: Nutrition: Goal: Adequate nutrition will be maintained Outcome: Progressing   

## 2023-08-28 NOTE — Consult Note (Signed)
 Physical Medicine and Rehabilitation Consult Reason for Consult:Acute rehab Referring Physician: Dr Elsie Montclair   HPI: Sarah Davis is a 63 y.o. R handed  female  with PMHx of: NSTEMI; bipolar d/o; CKD3a; COPD; hx of CVA, neuropathy; new dx of hypothyroidism during this hospitalization admitted to [facility] 07/28/23 with metabolic encephalopathy due to hypoglycemia- given D10 gtt. However once she woke up, was determined to have NSTEMI and transferred to [facility]. She underwent a cardiac Cath 07/31/23 and found to have chronic LAD occlusion. However she developed Acute resp failure and needed intubation emergently at the end of cardiac cath. She also developed worsening of AMS. She underwent EEG- was negative, MRI was (-) for anything acute; she stayed sedated and finally found an ammonia level of 250 on 6/16.  - started lactulose  and carnitine.  6/17- ammonia dropped into 70's with treatment.  However by 6/21, her LFTs started to increase due to carnitine; by 6/25- speaking oin phrases and 6/27 transferred out of ICU and still on TFs via Cortrak. 7/1- Cotrak removed due ot advance of diet.  7/2- her WBC up to 18k; found to have UTI- started on Rocephin . But had recurrent urinary retention, so foley, which had been removed, was put back in.  7/6- her ABX was changed to Meropenem  due to ESBL Klebsiella.  Quesiton of NASH; Also had acute on chronic CHF treatment; but limited due to hypotension. she is now on D3 thin diet Her BMI is 19- she doesn't know when last had BM- mother doesn't either (chart shows yesterday AM)- but feels constipated.     Review of Systems  Unable to perform ROS: Mental acuity  Pt denies pain- but otherwise hard to get info from her.   Past Medical History:  Diagnosis Date   Anxiety    Bipolar 2 disorder (HCC)    Chronic hepatitis C virus genotype 1 infection (HCC)    Chronic pancreatitis (HCC)    CKD (chronic kidney disease) stage 4, GFR 15-29 ml/min (HCC)     COPD (chronic obstructive pulmonary disease) (HCC)    Coronary artery disease 11/2019   s/p PTCI  LCx & RCA. Complicated by Vfib arrest   DM (diabetes mellitus), type 2 (HCC)    HFrEF (heart failure with reduced ejection fraction) (HCC) 08/01/2023   EF 45-50%   Hyperparathyroidism, secondary (HCC)    Hypothyroid    Lithium toxicity    Marijuana abuse    NSTEMI (non-ST elevated myocardial infarction) (HCC) 07/30/2023   med management   Peripheral autonomic neuropathy due to DM [facility])    STEMI (ST elevation myocardial infarction) (HCC) 2021   Stroke [facility])    right hemiparesis   Substance abuse (HCC)    Tardive dyskinesia    Tobacco abuse    Past Surgical History:  Procedure Laterality Date   CARDIAC CATHETERIZATION     CHOLECYSTECTOMY     LEFT HEART CATH AND CORONARY ANGIOGRAPHY N/A 07/31/2023   Patent LCx & RCA stents. Chronic occlussion mLAD with collaterals=med management   Family History  Problem Relation Age of Onset   Diabetes Father    Social History:  reports that she has been smoking cigarettes. She started smoking about 29 years ago. She has a 14.8 pack-year smoking history. She has never used smokeless tobacco. She reports current drug use. Drugs: Benzodiazepines and Marijuana. She reports that she does not drink alcohol. Allergies:  Allergies  Allergen Reactions   Lithium Anaphylaxis   Sulfa Antibiotics Rash  Codeine Rash   Medications Prior to Admission  Medication Sig Dispense Refill   aspirin  EC 81 MG tablet Take 81 mg by mouth in the morning.     atorvastatin  (LIPITOR) 80 MG tablet Take 80 mg by mouth at bedtime.     busPIRone  (BUSPAR ) 15 MG tablet Take 15 mg by mouth 2 (two) times daily.     furosemide  (LASIX ) 20 MG tablet Take 20 mg by mouth once a week. On Mondays     gabapentin  (NEURONTIN ) 300 MG capsule Take 300-600 mg by mouth at bedtime.      lamoTRIgine  (LAMICTAL ) 100 MG tablet Take 100 mg by mouth at bedtime.  0   LANTUS  SOLOSTAR 100 UNIT/ML  Solostar Pen Inject 30 Units into the skin daily with lunch.     levothyroxine  (SYNTHROID ) 100 MCG tablet Take 100 mcg by mouth every morning.     nitroGLYCERIN  (NITROSTAT ) 0.4 MG SL tablet Place 0.4 mg under the tongue every 5 (five) minutes as needed for chest pain.     paliperidone (INVEGA) 3 MG 24 hr tablet Take 3 mg by mouth at bedtime.     traZODone  (DESYREL ) 100 MG tablet Take 400 mg by mouth at bedtime.     Vitamin D , Ergocalciferol , (DRISDOL) 1.25 MG (50000 UNIT) CAPS capsule Take 50,000 Units by mouth once a week. On Mondays      Home: Home Living Family/patient expects to be discharged to:: Private residence Living Arrangements: Alone Available Help at Discharge: Family, Available 24 hours/day Type of Home: Apartment Home Access: Elevator Home Layout: One level Bathroom Shower/Tub: Engineer, manufacturing systems: Standard Home Equipment: Agricultural consultant (2 wheels), Other (comment) (knee scooter) Additional Comments: lives in 4th floor apartment. can go to daughter's house which is single story with 6 steps to enter  Functional History: Prior Function Prior Level of Function : Needs assist Mobility Comments: walking with RW, limited distance, doesn't drive ADLs Comments: family and friends doing grocery shopping and cleaning, able to manage ADLs though difficult at times Functional Status:  Mobility: Bed Mobility Overal bed mobility: Needs Assistance Bed Mobility: Rolling, Sidelying to Sit Rolling: Min assist, Used rails, Mod assist Sidelying to sit: Mod assist, +2 for safety/equipment Supine to sit: Max assist Sit to supine: Max assist, +2 for physical assistance General bed mobility comments: rolling in bed for hygiene, pt able to roll with cues and time, though needs A for further positioning for hygiene efforts; side to sit A for legs and trunk; to supine A for most aspects due to soiled again from BM after standing trials Transfers Overall transfer level: Needs  assistance Equipment used: 2 person hand held assist Transfers: Sit to/from Stand Sit to Stand: Mod assist, +2 physical assistance Bed to/from chair/wheelchair/BSC transfer type:: Via Lift equipment Stand pivot transfers: Max assist, +2 physical assistance Transfer via Lift Equipment: Stedy General transfer comment: stood x 2 from EOB with A for anterior weight shift and balance with pt reports fear of falling; used b/s table and pt's elbows on table second stand for improved support Ambulation/Gait Ambulation/Gait assistance: Mod assist, Max assist, +2 physical assistance Gait Distance (Feet): 1 Feet Assistive device:  (hands on bedside table) Gait Pattern/deviations: Decreased dorsiflexion - right, Decreased dorsiflexion - left, Step-to pattern, Shuffle, Trunk flexed General Gait Details: flexed with arms on bedside table. took 2 steps prior to needing to sit with mod cues for hip extension and max A for balance/safety, limited due to continued fecal incontinence    ADL:  ADL Overall ADL's : Needs assistance/impaired Eating/Feeding: Supervision/ safety, Set up, Cueing for sequencing, Minimal assistance Eating/Feeding Details (indicate cue type and reason): occasional min assist to load utensil, cues to stay on tasks and for sequencing Grooming: Wash/dry hands, Wash/dry face, Supervision/safety, Sitting Grooming Details (indicate cue type and reason): cues to initiate Upper Body Bathing: Total assistance Lower Body Bathing: Total assistance Upper Body Dressing : Total assistance Lower Body Dressing: Total assistance Toileting- Clothing Manipulation and Hygiene: Total assistance, Bed level General ADL Comments: family state patient having difficulty feeding self. OT provided setup and  provided family education on assistance and cues to provide to increase patient participation with self feeding.  Cognition: Cognition Orientation Level: Oriented to person, Oriented to place,  Disoriented to time, Disoriented to situation Cognition Arousal: Alert Behavior During Therapy: Flat affect  Blood pressure 109/76, pulse 65, temperature 97.7 F (36.5 C), temperature source Oral, resp. rate 18, height 5' 5 (1.651 m), weight 52 kg, SpO2 98%. Physical Exam Vitals and nursing note reviewed. Exam conducted with a chaperone present.  Constitutional:      Comments: Pt appears undernourished and older than stated age; asleep initially; but woke- would answer questions in phrases, but still confused and very delayed responses, mother at bedside; on low air loss mattress, NAD  HENT:     Right Ear: External ear normal.     Left Ear: External ear normal.     Nose: Nose normal. No congestion.     Mouth/Throat:     Mouth: Mucous membranes are dry.     Pharynx: Oropharynx is clear. No oropharyngeal exudate.  Eyes:     General:        Right eye: No discharge.        Left eye: No discharge.  Cardiovascular:     Rate and Rhythm: Regular rhythm.     Heart sounds: Normal heart sounds. No murmur heard.    No gallop.  Pulmonary:     Effort: Pulmonary effort is normal. No respiratory distress.     Breath sounds: Normal breath sounds. No wheezing or rhonchi.  Abdominal:     General: There is no distension.     Palpations: Abdomen is soft.     Tenderness: There is no abdominal tenderness.     Comments: Hypoactive BS  Genitourinary:    Comments: Foley in place- medium amber urine- just emptied by NT Musculoskeletal:     Cervical back: Neck supple. No tenderness.     Comments:  Deltoids 2+/5; biceps/triceps 4-/5 and WE/grip 4+/5 B/L LE's- HF 2/5- not antigravity at all; KE- couldn't get pt to participate; confused with directions DF 4-/5 and PF 4/5 B/L  Skin:    General: Skin is warm and dry.     Comments: Dry crepey skin- IV R forearm- a lot of UE pruple bruising Pressure ulcer- 1/2 dollar sized- draining greenish drainage on dressing- had on foam dressing- appears either deep  stage II or superficial stage III- mother reports was not even nickel sized a few days ago    Neurological:     Comments: Very delayed responses No speaking unless asked questions multiple times in different ways  Somewhat confused- thinks 1971 and then said 2023- just stared at me when asked month or day/date. Was not able to answer. Appears slightly better with yes/no questions Decreased to light touch from knees on down B/L- intact otherwise on Ue's and proximal LEs B/L  Psychiatric:     Comments: Extremely flat- no  facial expressions; just stared at me most of interview     Results for orders placed or performed during the hospital encounter of 07/30/23 (from the past 24 hours)  Glucose, capillary     Status: Abnormal   Collection Time: 08/27/23  4:18 PM  Result Value Ref Range   Glucose-Capillary 187 (H) 70 - 99 mg/dL  Glucose, capillary     Status: Abnormal   Collection Time: 08/27/23  8:58 PM  Result Value Ref Range   Glucose-Capillary 132 (H) 70 - 99 mg/dL  CBC     Status: Abnormal   Collection Time: 08/28/23  5:04 AM  Result Value Ref Range   WBC 9.1 4.0 - 10.5 K/uL   RBC 3.87 3.87 - 5.11 MIL/uL   Hemoglobin 10.3 (L) 12.0 - 15.0 g/dL   HCT 66.4 (L) 63.9 - 53.9 %   MCV 86.6 80.0 - 100.0 fL   MCH 26.6 26.0 - 34.0 pg   MCHC 30.7 30.0 - 36.0 g/dL   RDW 80.9 (H) 88.4 - 84.4 %   Platelets 344 150 - 400 K/uL   nRBC 0.0 0.0 - 0.2 %  Glucose, capillary     Status: Abnormal   Collection Time: 08/28/23  8:08 AM  Result Value Ref Range   Glucose-Capillary 160 (H) 70 - 99 mg/dL  Glucose, capillary     Status: Abnormal   Collection Time: 08/28/23 11:13 AM  Result Value Ref Range   Glucose-Capillary 161 (H) 70 - 99 mg/dL   No results found.   Assessment/Plan: Diagnosis: Metabolic encephalopathy and ICU myopathy Does the need for close, 24 hr/day medical supervision in concert with the patient's rehab needs make it unreasonable for this patient to be served in a less  intensive setting? Yes Co-Morbidities requiring supervision/potential complications: Metabolic encephalopathy, high ammonia; dysphagia, urinary retention- delayed responses, decreased interaction; NSTEMI; bipolar d/o; CKD3a; COPD; hx of CVA, neuropathy; new dx of hypothyroidism Due to bladder management, bowel management, safety, skin/wound care, disease management, medication administration, and pain management, does the patient require 24 hr/day rehab nursing? Yes Does the patient require coordinated care of a physician, rehab nurse, therapy disciplines of PT, OT and SLP to address physical and functional deficits in the context of the above medical diagnosis(es)? Yes Addressing deficits in the following areas: balance, endurance, locomotion, strength, transferring, bowel/bladder control, bathing, dressing, feeding, grooming, toileting, cognition, language, and swallowing Can the patient actively participate in an intensive therapy program of at least 3 hrs of therapy per day at least 5 days per week? Yes The potential for patient to make measurable gains while on inpatient rehab is good Anticipated functional outcomes upon discharge from inpatient rehab are min assist  with PT, min assist with OT, min assist with SLP. Estimated rehab length of stay to reach the above functional goals is: 2.5 to 3 weeks Anticipated discharge destination: Home Overall Rehab/Functional Prognosis: good  RECOMMENDATIONS: This patient's condition is appropriate for continued rehabilitative care in the following setting: CIR most probably, however not quite ready yet- she needs ot be able to tolerate 3 hours/day of therapy Patient has agreed to participate in recommended program. Potentially Note that insurance prior authorization may be required for reimbursement for recommended care.  Comment: 1. Patient feeling constipated- per treating with lactulose , should be having BM's 2-4x/day to ideally decrease ammonia  levels- pt was significantly confused when I was there- her mother said a little more than normal- I suggest increasing her Lactulose , treatment for Hyperammonia.  2.  Trazodone  can cause increased urinary retention- I suggest that be stopped and changed to something else- I rarely suggest Restoril, but might be a better choice in her case? Cannot use Remeron due to her Bipolar d/o 3. Suggest moving her Flomax  to at bedtime- and maybe increasing to 0.8 mg at bedtime?- this might help her urinate better when foley removed- also being in AM, makes hypotension worse. 4. Pt needs to be turned q2 hours- her Sacral pressure ulcer is getting worse upon examination and d/w mother- it's deeper and larger- 5.  I suggest calling WOC for other treatments as well, due to greenish drainage from sacral pressure ulcer.  6. Based on pt's exam, she appears to have ICU myopathy, not just debility- she is much weaker in her proximal arms and legs than distally.  This is likely due to prolonged time in ICU.  7. My concern is that pt has some other dx causing her Sx's- because not just cognitive sx's, she also has urinary retention as well as proximal weakness- wondering if any chance has anoxic Brain injury? Doesn't have a reason to have urinary retention, - there might not be a unifying dx, but is possible.  I understand she has ICU myopathy and metabolic encephalopathy- however as I said above, no reason for retention. I think additional work up might be needed.    Rodd Heft, MD 08/28/2023   I spent a total of 87   minutes on total care today- >50% coordination of care- due to  Review of expansive chart, documentation took 29 minutes itself- also spoke to pt's NT as well as admissions coordinator- also made multiple medical suggestions for treatment.

## 2023-08-29 ENCOUNTER — Inpatient Hospital Stay (HOSPITAL_COMMUNITY): Payer: MEDICAID

## 2023-08-29 DIAGNOSIS — G9341 Metabolic encephalopathy: Secondary | ICD-10-CM | POA: Diagnosis not present

## 2023-08-29 LAB — GLUCOSE, CAPILLARY
Glucose-Capillary: 162 mg/dL — ABNORMAL HIGH (ref 70–99)
Glucose-Capillary: 179 mg/dL — ABNORMAL HIGH (ref 70–99)
Glucose-Capillary: 197 mg/dL — ABNORMAL HIGH (ref 70–99)
Glucose-Capillary: 89 mg/dL (ref 70–99)

## 2023-08-29 NOTE — Progress Notes (Signed)
 Occupational Therapy Treatment Patient Details Name: Sarah Davis MRN: [id number] DOB: [birth year] Today's Date: 08/29/2023   History of present illness 63 yo female admitted 07/30/23. 6/12 cardiac cath with somnolence and respiratory failure post procedure requiring intubation 6/13-6/20. NSTEMI, encephalopathy and AKI impacting course. PMhx: polysubstance abuse, bipolar disorder, thyroid disease, hep C, COPD, DM, HFrEF, CVA with Rt hemiparesis   OT comments  Pt making good progress towards OT goals, meeting 3/5 and updated accordingly. Session focused on bed level bathing, dressing and grooming tasks. With consistent cues, pt able to assist with UB/LB bathing/dressing aspects with Mod A. Pt requiring Supervision-Min A for grooming task due to observed L shoulder weakness. Pt with consistent verbalization, fair command following though cognitive deficits persist. Of note, when cued to wash legs, arms, etc, pt often initiating washing face. Provided squeeze sponge to work on grip strength. Continue to recommend consideration of intensive rehab services upon DC.      If plan is discharge home, recommend the following:  A lot of help with walking and/or transfers;Two people to help with walking and/or transfers;A lot of help with bathing/dressing/bathroom;Assistance with feeding   Equipment Recommendations  Wheelchair cushion (measurements OT);Wheelchair (measurements OT);Other (comment) (TBD)    Recommendations for Other Services Rehab consult    Precautions / Restrictions Precautions Precautions: Fall Recall of Precautions/Restrictions: Impaired Precaution/Restrictions Comments: fecal incontinence Restrictions Weight Bearing Restrictions Per Provider Order: No       Mobility Bed Mobility Overal bed mobility: Needs Assistance Bed Mobility: Rolling Rolling: Supervision         General bed mobility comments: Able to roll side to side with Supervision for bed pad adjustment. good  initiation and use of bed rails    Transfers                         Balance                                           ADL either performed or assessed with clinical judgement   ADL Overall ADL's : Needs assistance/impaired Eating/Feeding: Supervision/ safety;Bed level Eating/Feeding Details (indicate cue type and reason): Supervision w/ cues to avoid rapid swallowing from straw. holding cup with L hand Grooming: Supervision/safety;Bed level;Wash/dry face;Minimal assistance;Applying deodorant Grooming Details (indicate cue type and reason): Washing face easily- often when cued to wash other body parts, pt automatically washing face again. Assist to lift LUE for RUE to don deodorant, able to don under RUE without issues Upper Body Bathing: Moderate assistance;Bed level Upper Body Bathing Details (indicate cue type and reason): with cues, pt able to bathe underarms with assist to lift LUE. able to minimally wash chest/arms with cues/assist to manage the rest Lower Body Bathing: Moderate assistance;Bed level Lower Body Bathing Details (indicate cue type and reason): pt able to wash B upper LE and anterior peri region in bed with cues. Upper Body Dressing : Moderate assistance;Bed level   Lower Body Dressing: Moderate assistance;Bed level Lower Body Dressing Details (indicate cue type and reason): With OT donning socks on B toes, pt able to reach, bring feet to self to pull up over heel               General ADL Comments: Emphasis on bed level ADL tasks in chair position.    Extremity/Trunk Assessment Upper Extremity Assessment Upper Extremity Assessment: Generalized weakness;Right  hand dominant;LUE deficits/detail;RUE deficits/detail RUE Deficits / Details: history of CVA but moving this UE well in comparison to LUE LUE Deficits / Details: difficulty with shoulder flexion- reports weakness, no pain. requiring AAROM to lift for UB ADLs.   Lower Extremity  Assessment Lower Extremity Assessment: Defer to PT evaluation        Vision   Vision Assessment?: No apparent visual deficits   Perception     Praxis     Communication Communication Communication: Impaired Factors Affecting Communication: Difficulty expressing self   Cognition Arousal: Alert Behavior During Therapy: Flat affect Cognition: Cognition impaired Difficult to assess due to: Impaired communication   Awareness: Intellectual awareness impaired, Online awareness impaired Memory impairment (select all impairments): Working [employer] fast streamer, Short-term memory Attention impairment (select first level of impairment): Sustained attention, Selective attention Executive functioning impairment (select all impairments): Sequencing, Problem solving, Organization, Reasoning OT - Cognition Comments: improved verbalization. answering 75% of direct questions during session. noted difficulty sequencing ADLs/command following at times requiring consistent cues. able to talk to sister on phone briefly during sesion. cues for safe swallowing needed d/t rapid drinking from straw                 Following commands: Impaired Following commands impaired: Follows one step commands inconsistently, Follows one step commands with increased time      Cueing   Cueing Techniques: Verbal cues, Visual cues, Tactile cues  Exercises      Shoulder Instructions       General Comments      Pertinent Vitals/ Pain       Pain Assessment Pain Assessment: No/denies pain Pain Intervention(s): Monitored during session  Home Living                                          Prior Functioning/Environment              Frequency  Min 2X/week        Progress Toward Goals  OT Goals(current goals can now be found in the care plan section)  Progress towards OT goals: Progressing toward goals  Acute Rehab OT Goals Patient Stated Goal: i want something to drink OT Goal  Formulation: With patient Time For Goal Achievement: 09/12/23 Potential to Achieve Goals: Good  Plan      Co-evaluation                 AM-PAC OT 6 Clicks Daily Activity     Outcome Measure   Help from another person eating meals?: A Little Help from another person taking care of personal grooming?: A Little Help from another person toileting, which includes using toliet, bedpan, or urinal?: A Lot Help from another person bathing (including washing, rinsing, drying)?: A Lot Help from another person to put on and taking off regular upper body clothing?: A Lot Help from another person to put on and taking off regular lower body clothing?: A Lot 6 Click Score: 14    End of Session    OT Visit Diagnosis: Other abnormalities of gait and mobility (R26.89);Muscle weakness (generalized) (M62.81);Other symptoms and signs involving cognitive function   Activity Tolerance Patient tolerated treatment well   Patient Left in bed;with call bell/phone within reach   Nurse Communication Other (comment) (discussed with NT)        Time: 9196-9164 OT Time Calculation (min): 32 min  Charges: OT General  Charges $OT Visit: 1 Visit OT Treatments $Self Care/Home Management : 23-37 mins  Mliss NOVAK, OTR/L Acute Rehab Services Office: [phone]   Mliss Fish 08/29/2023, 8:43 AM

## 2023-08-29 NOTE — Progress Notes (Signed)
 Physical Therapy Treatment Patient Details Name: Sarah Davis MRN: [id number] DOB: Mar 17, 1960 Today's Date: 08/29/2023   History of Present Illness 63 yo female admitted 07/30/23. 6/12 cardiac cath with somnolence and respiratory failure post procedure requiring intubation 6/13-6/20. NSTEMI, encephalopathy and AKI impacting course. PMhx: polysubstance abuse, bipolar disorder, thyroid disease, hep C, COPD, DM, HFrEF, CVA with Rt hemiparesis    PT Comments  Patient limited this session due to session focus on safety with pt back to bed after found on floor and assisted for hygiene.  She denied injury though nursing still assessing.  Patient able to pull herself up in bed and assisted to sit up for placement of lift pad when on the floor.  She will need intensive inpatient rehab prior to d/c home.  Family supportive and wanting to take her home post rehab.  PT will continue to follow.     If plan is discharge home, recommend the following: Two people to help with walking and/or transfers;Two people to help with bathing/dressing/bathroom;Supervision due to cognitive status;Assist for transportation;Help with stairs or ramp for entrance   Can travel by private vehicle     No  Equipment Recommendations  Hospital bed;Wheelchair cushion (measurements PT);Wheelchair (measurements PT)    Recommendations for Other Services       Precautions / Restrictions Precautions Precautions: Fall Recall of Precautions/Restrictions: Impaired Precaution/Restrictions Comments: fecal incontinence     Mobility  Bed Mobility Overal bed mobility: Needs Assistance Bed Mobility: Rolling Rolling: Supervision     Sit to supine: Total assist   General bed mobility comments: pt in floor upon PT entry, assist back to bed with nursing staff via lift; pt scooting up in bed using rails with mod cues and min A bed in trendelenberg    Transfers Overall transfer level: Needs assistance   Transfers: Bed to  chair/wheelchair/BSC             General transfer comment: in floor upon PT entry, assisted to bed via lift; RN in to assess, replace foley bag and for hygiene due to fecal incontinence Transfer via Lift Equipment: Maximove  Ambulation/Gait                   Stairs             Wheelchair Mobility     Tilt Bed    Modified Rankin (Stroke Patients Only)       Balance                                            Communication Communication Communication: No apparent difficulties  Cognition Arousal: Alert Behavior During Therapy: Flat affect   PT - Cognitive impairments: Attention, Initiation, Problem solving, Awareness, Safety/Judgement                           Following commands impaired: Follows one step commands inconsistently, Follows one step commands with increased time    Cueing    Exercises      General Comments General comments (skin integrity, edema, etc.): soiled with BM so assisted with nursing for hygiene      Pertinent Vitals/Pain Pain Assessment Pain Assessment: No/denies pain    Home Living  Prior Function            PT Goals (current goals can now be found in the care plan section) Acute Rehab PT Goals PT Goal Formulation: Patient unable to participate in goal setting Time For Goal Achievement: 09/12/23 Potential to Achieve Goals: Fair Progress towards PT goals: Not progressing toward goals - comment;Goals updated    Frequency    Min 2X/week      PT Plan      Co-evaluation              AM-PAC PT 6 Clicks Mobility   Outcome Measure  Help needed turning from your back to your side while in a flat bed without using bedrails?: A Lot Help needed moving from lying on your back to sitting on the side of a flat bed without using bedrails?: Total Help needed moving to and from a bed to a chair (including a wheelchair)?: Total Help needed standing  up from a chair using your arms (e.g., wheelchair or bedside chair)?: Total Help needed to walk in hospital room?: Total Help needed climbing 3-5 steps with a railing? : Total 6 Click Score: 7    End of Session   Activity Tolerance: Treatment limited secondary to medical complications (Comment) (pt found on floor) Patient left: in bed;with call bell/phone within reach;with nursing/sitter in room Nurse Communication: Mobility status;Need for lift equipment PT Visit Diagnosis: Other abnormalities of gait and mobility (R26.89);Difficulty in walking, not elsewhere classified (R26.2);Other symptoms and signs involving the nervous system (R29.898)     Time: 1537-1610 PT Time Calculation (min) (ACUTE ONLY): 33 min  Charges:    $Therapeutic Activity: 23-37 mins PT General Charges $$ ACUTE PT VISIT: 1 Visit                     Sarah Davis, PT Acute Rehabilitation Services [phone] 08/29/2023    Sarah Davis 08/29/2023, 4:17 PM

## 2023-08-29 NOTE — Plan of Care (Signed)

## 2023-08-29 NOTE — Progress Notes (Addendum)
   Inpatient Rehabilitation Admissions Coordinator   I met with patient, Mom and Daughter, August. I reviewed with daughter goals and expectations of a possible CIR admit. She is aware and in agreement. She, Mom and other family can provide 24/7 physical care in Daughter's home after a CIR admit. I await further progress and will begin Auth next week for  possible Cir admit. Please Note Dr Jyl consult and recommendations to assist in that preparation moving forward. I will begin Auth with [facility] for possible admit next week.  Heron Leavell, RN, MSN Rehab Admissions Coordinator [phone] 08/29/2023 12:30 PM

## 2023-08-29 NOTE — Progress Notes (Signed)
 Patient had unwitnessed fall. PT was entering patients room to work with patient and found patient on the floor. This RN went to assist with getting patient back into bed and cleaned up as patient had a BM. Patient denies any injury or pain. Provider and primary nurse made aware, orders were placed.

## 2023-08-29 NOTE — Progress Notes (Signed)
 Speech Language Pathology Treatment: Dysphagia  Patient Details Name: Sarah Davis MRN: [id number] DOB: [birth year] Today's Date: 08/29/2023 Time: 9141-9086 SLP Time Calculation (min) (ACUTE ONLY): 15 min  Assessment / Plan / Recommendation Clinical Impression  Pt's mentation remains consistent with prior visits - poor initiation, flat affect, delayed verbal responses, paucity of output.  She continues to demonstrate some impulsivity with drinking liquids, but today this was without negative consequence. She self-fed graham crackers and drank thin liquids quickly with no s/sx of aspiration.  She states she has dentures at home. Her swallowing appears functional without concern for aspiration at this point.   Recommend advancing diet to regular, thin liquids. Give meds whole in liquid; if coughs, give in applesauce. Ms. Parkhill will need help with tray set-up and intermittent supervision depending on her initiation/mentation on any given day.  No further f/u is needed for dysphagia.  Our service will sign off. D/W RN and posted new swallowing sign at [facility].    HPI HPI: 63 year old female admitted from [facility] for cardiac cath 07/31/2023 without acute interventions required. MS changes post-procedure, somnolence and resp failure requiring emergent intubation 6/13-20. Dx acute metabolic encephalopathy, hyperammonemia, AKI, CAD with acute NSTEMI. PMHx stroke 2021, polysubstance abuse, tobacco abuse, bipolar, thyroid disease. MBS rec D2/thin      SLP Plan  All goals met          Recommendations  Diet recommendations: Regular;Thin liquid Liquids provided via: Cup;Straw Medication Administration: Whole meds with liquid Supervision:  (assist with tray set-up; intermittent supervision) Compensations: Small sips/bites Postural Changes and/or Swallow Maneuvers: Seated upright 90 degrees                  Oral care BID     Dysphagia, oropharyngeal phase (R13.12)     All goals met     Nakeeta Sebastiani L. Vona, MA CCC/SLP Clinical Specialist - Acute Care SLP Acute Rehabilitation Services Office number [phone]  Vona Palma Laurice  08/29/2023, 9:19 AM

## 2023-08-29 NOTE — Progress Notes (Addendum)
 PROGRESS NOTE    Sarah Davis  FMW:[id number] DOB: [birth year] DOA: 07/30/2023 PCP: Patient, No Pcp Per   Brief Narrative:  Sarah Davis was transferred from [facility] hospital to [facility] for cardiac catheterization.  62/F w history of bipolar, CKD, COPD, coronary artery disease, history of CVA, neuropathy and hypothyroidism, who was admitted to [facility] 07/28/23 with the working diagnosis of acute metabolic encephalopathy due to hypoglycemia.Treated with D10 drip.   She was found to have NSTEMI, prompting her transfer. Subsequently underwent cardiac catheterization 07/31/2023 noted to have chronic occlusion of mid LAD, medical management recommended. Post procedure she had worsening mentation, and  acute respiratory failure. Required intubation emergently. She transferred to the intensive care unit.   6/12 - CT negative for LVO, MRI negative for acute findings, advanced chronic microvascular ischemia,  no seizures on EEG.  6/14 remains somnolent.  6/16 unresponsive, ammonia back in 250 range; started carnitine, increased lactulose  6/17 ammonia back in 70s, still minimally responsive, remaining off sedation 6/21 LFTs elevated 6/23 no improvement in mental status, MRI ok, carnitine stopped as rising LFTs, possible DILI 6/24 spoke 1-2 words 6/25 speaking in phrases 6/26 held brief conversation -6/27, transferred from ICU to [facility] service   -6/28 slowly improving mentation, tolerating well tube feedings.  -6/29 mentation continues to improve, per her family at the bedside she is not yet back to her baseline.  -6/30 advanced diet, with improvement in swallow dysfunction.  -7/01 Cortrak removed.  -7/2: WBC upto 18K, foley removed, UA suggestive of infection> started ceftriaxone  - 7/4, recurrent retention, Foley replaced - 7/6, urine culture growing Klebsiella, mental status improving - 7/6 urine culture growing ESBL Klebsiella, ceftriaxone  changed to meropenem   - 7/8 Antibiotics  completed  Assessment & Plan:   Principal Problem:   Acute metabolic encephalopathy Active Problems:   Coronary artery disease   CKD stage 3a, GFR 45-59 ml/min (HCC)   Malnutrition of moderate degree   Hypothyroidism   Chronic anemia   Type 2 diabetes mellitus (HCC)  Acute metabolic encephalopathy - Multifactorial, likely related to hyperammonemia, severe hypoglycemia on original admission.  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  - Discussed case with psych, current medications likely not planning a role in her hyperammonemia, resumed per their recommendations  - Lamictal  25 mg daily -increase every 2 weeks as tolerated/for symptom management  - Trazodone  50 mg *low threshold to discontinue if mental status changes* - Cortrak removed, tolerating dysphagia diet - Discharge planning, TOC following, CIR evaluating   Leukocytosis Catheter related UTI, resolved Urinary retention - Catheter originally placed for retention on 6/29, noted to have some dysuria, foul smelling urine and worsening leukocytosis 7/2, UA abnormal - Initially treated with IV ceftriaxone , urine culture with 100,000 colonies of ESBL Klebsiella, transitioned to Meropenem  - stop date 08/26/23 -Recurrent episode of retention -> Foley replaced 7/3, routine catheter care -Consider voiding trial once UTI treatment completed   Acute on chronic systolic and diastolic CHF - Echocardiogram - EF 45 to 50%, mild LVH, RV systolic function preserved, small pericardial effusion, mild aortic stenosis; LV hypokinetic apical septal segment, apical anterior segment, apical inferior segment, and apex.  - Now appears euvolemic(off diuretics), GDMT limited by hypotension - Monitor off diuretics for now, add PRN at DC   Coronary artery disease NSTEMI with chronic occlusion of LAD.  Continue aspirin .,  Resume statin after discharge    CKD stage 3a, GFR  45-59  ml/min (HCC) AKI, hypernatremia,, hypokalemia.  - AKI has resolved   Malnutrition of moderate degree Continue nutritional supplements.    Hypothyroidism Continue levothyroxine     Chronic anemia Stable, monitor   Type 2 diabetes mellitus (HCC) CBGs are stable, continue glargine and SSI   DVT prophylaxis: enoxaparin  (LOVENOX ) injection 40 mg Start: 08/19/23 2200 SCD's Start: 07/31/23 1526 Code Status:   Code Status: Full Code Family Communication: None present  Status is: Inpatient  Dispo: The patient is from: Home              Anticipated d/c is to: To be determined              Anticipated d/c date is: To be determined              Patient currently is medically stable for discharge  Consultants:  Psych, cardiology  Procedures:  Intubation/extubation, EEG, echo  Antimicrobials:  Imipenem stop date 08/28/2023  Subjective: No acute issues or events overnight  Objective: Vitals:   08/28/23 0919 08/28/23 1216 08/28/23 1558 08/28/23 2025  BP:  109/76 106/66 95/66  Pulse:  65 64 77  Resp:  18 17 19   Temp:  97.7 F (36.5 C) 98.2 F (36.8 C) 98.9 F (37.2 C)  TempSrc:  Oral Oral   SpO2: 96% 98% 98% 97%  Weight:      Height:        Intake/Output Summary (Last 24 hours) at 08/29/2023 0812 Last data filed at 08/29/2023 0544 Gross per 24 hour  Intake 960 ml  Output 1900 ml  Net -940 ml   Filed Weights   08/25/23 0438 08/26/23 0500 08/27/23 0500  Weight: 53.6 kg 53.6 kg 52 kg    Examination:  General:  Pleasantly resting in bed, No acute distress. HEENT:  Normocephalic atraumatic.  Sclerae nonicteric, noninjected.  Extraocular movements intact bilaterally. Neck:  Without mass or deformity.  Trachea is midline. Lungs:  Clear to auscultate bilaterally without rhonchi, wheeze, or rales. Heart:  Regular rate and rhythm.  Without murmurs, rubs, or gallops. Abdomen:  Soft, nontender, nondistended.  Without guarding or rebound. Extremities: Without  cyanosis, clubbing, edema, or obvious deformity. Skin:  Warm and dry  Data Reviewed: I have personally reviewed following labs and imaging studies  CBC: Recent Labs  Lab 08/23/23 0531 08/24/23 0313 08/26/23 0419 08/27/23 0236 08/28/23 0504  WBC 11.4* 11.2* 9.5 10.8* 9.1  HGB 8.7* 9.7* 9.8* 10.2* 10.3*  HCT 28.4* 31.7* 31.9* 33.9* 33.5*  MCV 87.7 87.3 87.6 87.8 86.6  PLT 434* 431* 403* 330 344   Basic Metabolic Panel: Recent Labs  Lab 08/23/23 0531 08/24/23 0313 08/25/23 0415 08/26/23 0419 08/27/23 1441  NA 137 136 137 138 136  K 3.8 4.3 4.1 4.1 4.0  CL 103 102 102 103 96*  CO2 24 25 25 27 27   GLUCOSE 170* 190* 154* 124* 221*  BUN 27* 22 22 27* 23  CREATININE 0.80 0.78 0.88 0.82 0.95  CALCIUM  8.8* 8.8* 9.0 9.2 9.4   GFR: Estimated Creatinine Clearance: 49.8 mL/min (by C-G formula based on SCr of 0.95 mg/dL).  Recent Labs  Lab 08/24/23 0313 08/27/23 0808  AMMONIA 15 20   CBG: Recent Labs  Lab 08/27/23 1618 08/27/23 2058 08/28/23 0808 08/28/23 1113 08/28/23 1625  GLUCAP 187* 132* 160* 161* 127*    Recent Results (from the past 240 hours)  Urine Culture     Status: Abnormal   Collection Time: 08/21/23  7:09 AM   Specimen:  Urine, Clean Catch  Result Value Ref Range Status   Specimen Description URINE, CLEAN CATCH  Final   Special Requests   Final    NONE Performed at [facility], [address], [place], [place] [address]    Culture (A)  Final    >=100,000 COLONIES/mL KLEBSIELLA PNEUMONIAE Confirmed Extended Spectrum Beta-Lactamase Producer (ESBL).  In bloodstream infections from ESBL organisms, carbapenems are preferred over piperacillin/tazobactam. They are shown to have a lower risk of mortality.    Report Status 08/24/2023 FINAL  Final   Organism ID, Bacteria KLEBSIELLA PNEUMONIAE (A)  Final      Susceptibility   Klebsiella pneumoniae - MIC*    AMPICILLIN >=32 RESISTANT Resistant     CEFAZOLIN >=64 RESISTANT Resistant     CEFEPIME >=32  RESISTANT Resistant     CEFTRIAXONE  >=64 RESISTANT Resistant     CIPROFLOXACIN 1 RESISTANT Resistant     GENTAMICIN <=1 SENSITIVE Sensitive     IMIPENEM 0.5 SENSITIVE Sensitive     NITROFURANTOIN 128 RESISTANT Resistant     TRIMETH/SULFA >=320 RESISTANT Resistant     AMPICILLIN/SULBACTAM >=32 RESISTANT Resistant     PIP/TAZO 64 INTERMEDIATE Intermediate ug/mL    * >=100,000 COLONIES/mL KLEBSIELLA PNEUMONIAE         Radiology Studies: No results found.      Scheduled Meds:  arformoterol   15 mcg Nebulization BID   aspirin   81 mg Oral Daily   busPIRone   15 mg Oral BID   Chlorhexidine  Gluconate Cloth  6 each Topical Daily   enoxaparin  (LOVENOX ) injection  40 mg Subcutaneous QHS   feeding supplement  237 mL Oral BID BM   folic acid   1 mg Oral Daily   insulin  aspart  0-9 Units Subcutaneous TID WC   insulin  aspart  5 Units Subcutaneous TID WC   insulin  glargine-yfgn  12 Units Subcutaneous Daily   lactulose   10 g Oral BID   lamoTRIgine   25 mg Oral Daily   Followed by   NOREEN ON 09/10/2023] lamoTRIgine   50 mg Oral Daily   Followed by   NOREEN ON 09/24/2023] lamoTRIgine   100 mg Oral Daily   levothyroxine   100 mcg Oral q morning   multivitamin with minerals  1 tablet Oral Daily   mouth rinse  15 mL Mouth Rinse Q4H   revefenacin   175 mcg Nebulization Daily   tamsulosin   0.4 mg Oral Daily   thiamine   100 mg Oral Daily   traZODone   50 mg Oral QHS   Continuous Infusions:   LOS: 30 days   Time spent:  Elsie JAYSON Montclair, DO [facility]  If 7PM-7AM, please contact night-coverage [email or web address]  08/29/2023, 8:12 AM

## 2023-08-30 DIAGNOSIS — G9341 Metabolic encephalopathy: Secondary | ICD-10-CM | POA: Diagnosis not present

## 2023-08-30 LAB — GLUCOSE, CAPILLARY
Glucose-Capillary: 152 mg/dL — ABNORMAL HIGH (ref 70–99)
Glucose-Capillary: 205 mg/dL — ABNORMAL HIGH (ref 70–99)
Glucose-Capillary: 221 mg/dL — ABNORMAL HIGH (ref 70–99)
Glucose-Capillary: 114 mg/dL — ABNORMAL HIGH (ref 70–99)
Glucose-Capillary: 253 mg/dL — ABNORMAL HIGH (ref 70–99)

## 2023-08-30 MED ORDER — LACTATED RINGERS IV BOLUS
500.0000 mL | Freq: Once | INTRAVENOUS | Status: AC
  Administered 2023-08-30: 500 mL via INTRAVENOUS

## 2023-08-30 NOTE — Progress Notes (Signed)
 PROGRESS NOTE    Sarah Davis  FMW:[id number] DOB: 19-Jul-1960 DOA: 07/30/2023 PCP: Patient, No Pcp Per   Brief Narrative:  Sarah Davis was transferred from [facility] hospital to [facility] for cardiac catheterization.  62/F w history of bipolar, CKD, COPD, coronary artery disease, history of CVA, neuropathy and hypothyroidism, who was admitted to [facility] 07/28/23 with the working diagnosis of acute metabolic encephalopathy due to hypoglycemia.Treated with D10 drip.   She was found to have NSTEMI, prompting her transfer. Subsequently underwent cardiac catheterization 07/31/2023 noted to have chronic occlusion of mid LAD, medical management recommended. Post procedure she had worsening mentation, and  acute respiratory failure. Required intubation emergently. She transferred to the intensive care unit.   6/12 - CT negative for LVO, MRI negative for acute findings, advanced chronic microvascular ischemia,  no seizures on EEG.  6/14 remains somnolent.  6/16 unresponsive, ammonia back in 250 range; started carnitine, increased lactulose  6/17 ammonia back in 70s, still minimally responsive, remaining off sedation 6/21 LFTs elevated 6/23 no improvement in mental status, MRI ok, carnitine stopped as rising LFTs, possible DILI 6/24 spoke 1-2 words 6/25 speaking in phrases 6/26 held brief conversation -6/27, transferred from ICU to [name] service   -6/28 slowly improving mentation, tolerating well tube feedings.  -6/29 mentation continues to improve, per her family at the bedside she is not yet back to her baseline.  -6/30 advanced diet, with improvement in swallow dysfunction.  -7/01 Cortrak removed.  -7/2: WBC upto 18K, foley removed, UA suggestive of infection> started ceftriaxone  - 7/4, recurrent retention, Foley replaced - 7/6, urine culture growing Klebsiella, mental status improving - 7/6 urine culture growing ESBL Klebsiella, ceftriaxone  changed to meropenem   - 7/8 Antibiotics completed - 7/11  Patient had slip/fall - CT head negative - no other complaints/exam without notable physical findings  Assessment & Plan:   Principal Problem:   Acute metabolic encephalopathy Active Problems:   Coronary artery disease   CKD stage 3a, GFR 45-59 ml/min (HCC)   Malnutrition of moderate degree   Hypothyroidism   Chronic anemia   Type 2 diabetes mellitus (HCC)  Acute metabolic encephalopathy - Multifactorial, likely related to hyperammonemia, severe hypoglycemia on original admission.  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  - Discussed case with psych, current medications likely not planning a role in her hyperammonemia, resumed per their recommendations  - Lamictal  25 mg daily -increase every 2 weeks as tolerated/for symptom management  - Trazodone  50 mg *low threshold to discontinue if mental status changes* - Cortrak removed, tolerating dysphagia diet - Discharge planning, TOC following, CIR evaluating   Leukocytosis Catheter related UTI, resolved Urinary retention - Catheter originally placed for retention on 6/29, noted to have some dysuria, foul smelling urine and worsening leukocytosis 7/2, UA abnormal - Initially treated with IV ceftriaxone , urine culture with 100,000 colonies of ESBL Klebsiella, transitioned to Meropenem  - stop date 08/26/23 -Recurrent episode of retention -> Foley replaced 7/3, routine catheter care -Consider voiding trial once UTI treatment completed   Acute on chronic systolic and diastolic CHF - Echocardiogram - EF 45 to 50%, mild LVH, RV systolic function preserved, small pericardial effusion, mild aortic stenosis; LV hypokinetic apical septal segment, apical anterior segment, apical inferior segment, and apex.  - Now appears euvolemic(off diuretics), GDMT limited by hypotension - Monitor off diuretics for now, add PRN at DC   Ambulatory dysfunction, acute on chronic, POA   -  Continue to work with PT OT as able - 2+ assist/lift required recently - Fall evening 7/11 -CT head negative, physical exam otherwise negative for pain, no indication for further radiation exposure/imaging at this time  Coronary artery disease NSTEMI with chronic occlusion of LAD.  Continue aspirin .,  Resume statin after discharge    CKD stage 3a, GFR 45-59 ml/min (HCC) AKI, hypernatremia,, hypokalemia.  - AKI has resolved   Malnutrition of moderate degree Continue nutritional supplements.    Hypothyroidism Continue levothyroxine     Chronic anemia Stable, monitor   Type 2 diabetes mellitus (HCC) CBGs are stable, continue glargine and SSI   DVT prophylaxis: enoxaparin  (LOVENOX ) injection 40 mg Start: 08/19/23 2200 SCD's Start: 07/31/23 1526 Code Status:   Code Status: Full Code Family Communication: None present  Status is: Inpatient  Dispo: The patient is from: Home              Anticipated d/c is to: To be determined              Anticipated d/c date is: To be determined              Patient currently is medically stable for discharge  Consultants:  Psych, cardiology  Procedures:  Intubation/extubation, EEG, echo  Antimicrobials:  Imipenem stop date 08/28/2023  Subjective: No acute issues or events overnight  Objective: Vitals:   08/29/23 2056 08/30/23 0032 08/30/23 0500 08/30/23 0554  BP: 117/64 107/68  110/69  Pulse: 72 61  67  Resp: 16 17  17   Temp: 98.2 F (36.8 C) (!) 97.5 F (36.4 C)  97.6 F (36.4 C)  TempSrc:    Oral  SpO2: 99% 97%  98%  Weight:   54 kg   Height:        Intake/Output Summary (Last 24 hours) at 08/30/2023 0716 Last data filed at 08/30/2023 0516 Gross per 24 hour  Intake 240 ml  Output 1200 ml  Net -960 ml   Filed Weights   08/26/23 0500 08/27/23 0500 08/30/23 0500  Weight: 53.6 kg 52 kg 54 kg    Examination:  General:  Pleasantly resting in bed, No acute distress. HEENT:  Normocephalic atraumatic.  Sclerae  nonicteric, noninjected.  Extraocular movements intact bilaterally. Neck:  Without mass or deformity.  Trachea is midline. Lungs:  Clear to auscultate bilaterally without rhonchi, wheeze, or rales. Heart:  Regular rate and rhythm.  Without murmurs, rubs, or gallops. Abdomen:  Soft, nontender, nondistended.  Without guarding or rebound. Extremities: Without cyanosis, clubbing, edema, or obvious deformity. Skin:  Warm and dry  Data Reviewed: I have personally reviewed following labs and imaging studies  CBC: Recent Labs  Lab 08/24/23 0313 08/26/23 0419 08/27/23 0236 08/28/23 0504  WBC 11.2* 9.5 10.8* 9.1  HGB 9.7* 9.8* 10.2* 10.3*  HCT 31.7* 31.9* 33.9* 33.5*  MCV 87.3 87.6 87.8 86.6  PLT 431* 403* 330 344   Basic Metabolic Panel: Recent Labs  Lab 08/24/23 0313 08/25/23 0415 08/26/23 0419 08/27/23 1441  NA 136 137 138 136  K 4.3 4.1 4.1 4.0  CL 102 102 103 96*  CO2 25 25 27 27   GLUCOSE 190* 154* 124* 221*  BUN 22 22 27* 23  CREATININE 0.78 0.88 0.82 0.95  CALCIUM  8.8* 9.0 9.2 9.4   GFR: Estimated Creatinine Clearance: 51.7 mL/min (by C-G formula based on SCr of 0.95 mg/dL).  Recent Labs  Lab 08/24/23 0313 08/27/23 0808  AMMONIA 15 20   CBG: Recent Labs  Lab 08/28/23  1625 08/29/23 0820 08/29/23 1209 08/29/23 1637 08/29/23 2057  GLUCAP 127* 162* 179* 197* 89    Recent Results (from the past 240 hours)  Urine Culture     Status: Abnormal   Collection Time: 08/21/23  7:09 AM   Specimen: Urine, Clean Catch  Result Value Ref Range Status   Specimen Description URINE, CLEAN CATCH  Final   Special Requests   Final    NONE Performed at [facility], [address] [place], [place] [address]    Culture (A)  Final    >=100,000 COLONIES/mL KLEBSIELLA PNEUMONIAE Confirmed Extended Spectrum Beta-Lactamase Producer (ESBL).  In bloodstream infections from ESBL organisms, carbapenems are preferred over piperacillin/tazobactam. They are shown to have a lower  risk of mortality.    Report Status 08/24/2023 FINAL  Final   Organism ID, Bacteria KLEBSIELLA PNEUMONIAE (A)  Final      Susceptibility   Klebsiella pneumoniae - MIC*    AMPICILLIN >=32 RESISTANT Resistant     CEFAZOLIN >=64 RESISTANT Resistant     CEFEPIME >=32 RESISTANT Resistant     CEFTRIAXONE  >=64 RESISTANT Resistant     CIPROFLOXACIN 1 RESISTANT Resistant     GENTAMICIN <=1 SENSITIVE Sensitive     IMIPENEM 0.5 SENSITIVE Sensitive     NITROFURANTOIN 128 RESISTANT Resistant     TRIMETH/SULFA >=320 RESISTANT Resistant     AMPICILLIN/SULBACTAM >=32 RESISTANT Resistant     PIP/TAZO 64 INTERMEDIATE Intermediate ug/mL    * >=100,000 COLONIES/mL KLEBSIELLA PNEUMONIAE         Radiology Studies: CT HEAD WO CONTRAST ( ) Result Date: 08/29/2023 CLINICAL DATA:  Initial evaluation for acute head trauma. EXAM: CT HEAD WITHOUT CONTRAST TECHNIQUE: Contiguous axial images were obtained from the base of the skull through the vertex without intravenous contrast. RADIATION DOSE REDUCTION: This exam was performed according to the departmental dose-optimization program which includes automated exposure control, adjustment of the mA and/or kV according to patient size and/or use of iterative reconstruction technique. COMPARISON:  Prior CT from 08/07/2023. FINDINGS: Brain: Cerebral volume within normal limits. Mild chronic microvascular ischemic disease for age. No acute intracranial hemorrhage. No acute large vessel territory infarct. No mass lesion or midline shift. No hydrocephalus or extra-axial fluid collection. Vascular: No abnormal hyperdense vessel. Scattered vascular calcifications noted within the carotid siphons. Skull: Scalp soft tissues demonstrate no acute finding. Calvarium intact. Sinuses/Orbits: Punctate optic drusen bodies noted. Globes orbital soft tissues otherwise unremarkable. Paranasal sinuses are clear. No mastoid effusion. Other: None. IMPRESSION: 1. No acute intracranial  abnormality. 2. Mild chronic microvascular ischemic disease for age. Electronically Signed   By: Morene Hoard M.D.   On: 08/29/2023 20:50        Scheduled Meds:  arformoterol   15 mcg Nebulization BID   aspirin   81 mg Oral Daily   busPIRone   15 mg Oral BID   Chlorhexidine  Gluconate Cloth  6 each Topical Daily   enoxaparin  (LOVENOX ) injection  40 mg Subcutaneous QHS   feeding supplement  237 mL Oral BID BM   folic acid   1 mg Oral Daily   insulin  aspart  0-9 Units Subcutaneous TID WC   insulin  aspart  5 Units Subcutaneous TID WC   insulin  glargine-yfgn  12 Units Subcutaneous Daily   lactulose   10 g Oral BID   lamoTRIgine   25 mg Oral Daily   Followed by   NOREEN ON 09/10/2023] lamoTRIgine   50 mg Oral Daily   Followed by   NOREEN ON 09/24/2023] lamoTRIgine   100  mg Oral Daily   levothyroxine   100 mcg Oral q morning   multivitamin with minerals  1 tablet Oral Daily   mouth rinse  15 mL Mouth Rinse Q4H   revefenacin   175 mcg Nebulization Daily   tamsulosin   0.4 mg Oral Daily   thiamine   100 mg Oral Daily   traZODone   50 mg Oral QHS   Continuous Infusions:   LOS: 31 days   Time spent:  Elsie JAYSON Mont[place], DO [facility]  If 7PM-7AM, please contact night-coverage [email or web address]  08/30/2023, 7:16 AM

## 2023-08-30 NOTE — Plan of Care (Signed)
   Problem: Education: Goal: Knowledge of General Education information will improve Description: Including pain rating scale, medication(s)/side effects and non-pharmacologic comfort measures Outcome: Progressing   Problem: Elimination: Goal: Will not experience complications related to bowel motility Outcome: Progressing   Problem: Safety: Goal: Ability to remain free from injury will improve Outcome: Progressing

## 2023-08-30 NOTE — Plan of Care (Signed)
 Problem: Education: Goal: Knowledge of General Education information will improve Description: Including pain rating scale, medication(s)/side effects and non-pharmacologic comfort measures Outcome: Progressing   Problem: Health Behavior/Discharge Planning: Goal: Ability to manage health-related needs will improve Outcome: Progressing   Problem: Clinical Measurements: Goal: Ability to maintain clinical measurements within normal limits will improve Outcome: Progressing Goal: Will remain free from infection Outcome: Progressing Goal: Diagnostic test results will improve Outcome: Progressing Goal: Respiratory complications will improve Outcome: Progressing Goal: Cardiovascular complication will be avoided Outcome: Progressing   Problem: Activity: Goal: Risk for activity intolerance will decrease Outcome: Progressing   Problem: Nutrition: Goal: Adequate nutrition will be maintained Outcome: Progressing   Problem: Coping: Goal: Level of anxiety will decrease Outcome: Progressing   Problem: Elimination: Goal: Will not experience complications related to bowel motility Outcome: Progressing Goal: Will not experience complications related to urinary retention Outcome: Progressing   Problem: Pain Managment: Goal: General experience of comfort will improve and/or be controlled Outcome: Progressing   Problem: Safety: Goal: Ability to remain free from injury will improve Outcome: Progressing   Problem: Skin Integrity: Goal: Risk for impaired skin integrity will decrease Outcome: Progressing   Problem: Cardiac: Goal: Ability to achieve and maintain adequate cardiovascular perfusion will improve Outcome: Progressing   Problem: Education: Goal: Ability to describe self-care measures that may prevent or decrease complications (Diabetes Survival Skills Education) will improve Outcome: Progressing Goal: Individualized Educational Video(s) Outcome: Progressing   Problem:  Coping: Goal: Ability to adjust to condition or change in health will improve Outcome: Progressing   Problem: Fluid Volume: Goal: Ability to maintain a balanced intake and output will improve Outcome: Progressing   Problem: Health Behavior/Discharge Planning: Goal: Ability to identify and utilize available resources and services will improve Outcome: Progressing Goal: Ability to manage health-related needs will improve Outcome: Progressing   Problem: Metabolic: Goal: Ability to maintain appropriate glucose levels will improve Outcome: Progressing   Problem: Nutritional: Goal: Maintenance of adequate nutrition will improve Outcome: Progressing Goal: Progress toward achieving an optimal weight will improve Outcome: Progressing   Problem: Skin Integrity: Goal: Risk for impaired skin integrity will decrease Outcome: Progressing   Problem: Tissue Perfusion: Goal: Adequacy of tissue perfusion will improve Outcome: Progressing   Problem: Consults Goal: Venous Thromboembolism Patient Education Description: See Patient Education Module for education specifics. Outcome: Progressing Goal: Diagnosis - Venous Thromboembolism (VTE) Description: Choose a selection Outcome: Progressing Note: DVT (Deep Vein Thrombosis) Goal: Pharmacy Consult for anticoagulation Outcome: Progressing Goal: Skin Care Protocol Initiated - if Braden Score 18 or less Description: If consults are not indicated, leave blank or document N/A Outcome: Progressing Goal: Nutrition Consult-if indicated Outcome: Progressing Goal: Diabetes Guidelines if Diabetic/Glucose > 140 Description: If diabetic or lab glucose is > 140 mg/dl - Initiate Diabetes/Hyperglycemia Guidelines & Document Interventions  Outcome: Progressing   Problem: Phase I Progression Outcomes Goal: Pain controlled with appropriate interventions Outcome: Progressing Goal: Dyspnea controlled at rest (PE) Outcome: Progressing Goal: Tolerating  diet Outcome: Progressing Goal: Initial discharge plan identified Outcome: Progressing Goal: Voiding-avoid urinary catheter unless indicated Outcome: Progressing Goal: Hemodynamically stable Outcome: Progressing Goal: Other Phase I Outcomes/Goals Outcome: Progressing   Problem: Phase II Progression Outcomes Goal: Therapeutic drug levels for anticoagulation Outcome: Progressing Goal: 02 sats trending upward/stable (PE) Outcome: Progressing Goal: Discharge plan established Outcome: Progressing Goal: Tolerating diet Outcome: Progressing Goal: Other Phase II Outcomes/Goals Outcome: Progressing   Problem: Phase III Progression Outcomes Goal: 02 sats stabilized Outcome: Progressing Goal: Activity at appropriate level-compared to baseline Description: (UP  IN CHAIR FOR HEMODIALYSIS) Outcome: Progressing Goal: Discharge plan remains appropriate-arrangements made Outcome: Progressing Goal: Other Phase III Outcomes/Goals Outcome: Progressing   Problem: Discharge Progression Outcomes Goal: Barriers To Progression Addressed/Resolved Outcome: Progressing Goal: Discharge plan in place and appropriate Outcome: Progressing Goal: Pain controlled with appropriate interventions Outcome: Progressing Goal: Hemodynamically stable Outcome: Progressing Goal: Complications resolved/controlled Outcome: Progressing Goal: Tolerating diet Outcome: Progressing Goal: Activity appropriate for discharge plan Outcome: Progressing Goal: Other Discharge Outcomes/Goals Outcome: Progressing

## 2023-08-30 NOTE — Plan of Care (Signed)
 Problem: Education: Goal: Knowledge of General Education information will improve Description: Including pain rating scale, medication(s)/side effects and non-pharmacologic comfort measures Outcome: Progressing   Problem: Health Behavior/Discharge Planning: Goal: Ability to manage health-related needs will improve Outcome: Progressing   Problem: Clinical Measurements: Goal: Ability to maintain clinical measurements within normal limits will improve Outcome: Progressing Goal: Will remain free from infection Outcome: Progressing Goal: Diagnostic test results will improve Outcome: Progressing Goal: Respiratory complications will improve Outcome: Progressing Goal: Cardiovascular complication will be avoided Outcome: Progressing   Problem: Activity: Goal: Risk for activity intolerance will decrease Outcome: Progressing   Problem: Nutrition: Goal: Adequate nutrition will be maintained Outcome: Progressing   Problem: Coping: Goal: Level of anxiety will decrease Outcome: Progressing   Problem: Elimination: Goal: Will not experience complications related to bowel motility Outcome: Progressing Goal: Will not experience complications related to urinary retention Outcome: Progressing   Problem: Pain Managment: Goal: General experience of comfort will improve and/or be controlled Outcome: Progressing   Problem: Safety: Goal: Ability to remain free from injury will improve Outcome: Progressing   Problem: Skin Integrity: Goal: Risk for impaired skin integrity will decrease Outcome: Progressing   Problem: Cardiac: Goal: Ability to achieve and maintain adequate cardiovascular perfusion will improve Outcome: Progressing   Problem: Education: Goal: Ability to describe self-care measures that may prevent or decrease complications (Diabetes Survival Skills Education) will improve Outcome: Progressing Goal: Individualized Educational Video(s) Outcome: Progressing   Problem:  Coping: Goal: Ability to adjust to condition or change in health will improve Outcome: Progressing   Problem: Fluid Volume: Goal: Ability to maintain a balanced intake and output will improve Outcome: Progressing   Problem: Health Behavior/Discharge Planning: Goal: Ability to identify and utilize available resources and services will improve Outcome: Progressing Goal: Ability to manage health-related needs will improve Outcome: Progressing   Problem: Metabolic: Goal: Ability to maintain appropriate glucose levels will improve Outcome: Progressing   Problem: Nutritional: Goal: Maintenance of adequate nutrition will improve Outcome: Progressing Goal: Progress toward achieving an optimal weight will improve Outcome: Progressing   Problem: Skin Integrity: Goal: Risk for impaired skin integrity will decrease Outcome: Progressing   Problem: Tissue Perfusion: Goal: Adequacy of tissue perfusion will improve Outcome: Progressing   Problem: Consults Goal: Venous Thromboembolism Patient Education Description: See Patient Education Module for education specifics. Outcome: Progressing Goal: Diagnosis - Venous Thromboembolism (VTE) Description: Choose a selection Outcome: Progressing Goal: Pharmacy Consult for anticoagulation Outcome: Progressing Goal: Skin Care Protocol Initiated - if Braden Score 18 or less Description: If consults are not indicated, leave blank or document N/A Outcome: Progressing Goal: Nutrition Consult-if indicated Outcome: Progressing Goal: Diabetes Guidelines if Diabetic/Glucose > 140 Description: If diabetic or lab glucose is > 140 mg/dl - Initiate Diabetes/Hyperglycemia Guidelines & Document Interventions  Outcome: Progressing   Problem: Phase I Progression Outcomes Goal: Pain controlled with appropriate interventions Outcome: Progressing Goal: Dyspnea controlled at rest (PE) Outcome: Progressing Goal: Tolerating diet Outcome: Progressing Goal:  Initial discharge plan identified Outcome: Progressing Goal: Voiding-avoid urinary catheter unless indicated Outcome: Progressing Goal: Hemodynamically stable Outcome: Progressing Goal: Other Phase I Outcomes/Goals Outcome: Progressing   Problem: Phase II Progression Outcomes Goal: Therapeutic drug levels for anticoagulation Outcome: Progressing Goal: 02 sats trending upward/stable (PE) Outcome: Progressing Goal: Discharge plan established Outcome: Progressing Goal: Tolerating diet Outcome: Progressing Goal: Other Phase II Outcomes/Goals Outcome: Progressing   Problem: Phase III Progression Outcomes Goal: 02 sats stabilized Outcome: Progressing Goal: Activity at appropriate level-compared to baseline Description: (UP IN CHAIR FOR HEMODIALYSIS) Outcome:  Progressing Goal: Discharge plan remains appropriate-arrangements made Outcome: Progressing Goal: Other Phase III Outcomes/Goals Outcome: Progressing   Problem: Discharge Progression Outcomes Goal: Barriers To Progression Addressed/Resolved Outcome: Progressing Goal: Discharge plan in place and appropriate Outcome: Progressing Goal: Pain controlled with appropriate interventions Outcome: Progressing Goal: Hemodynamically stable Outcome: Progressing Goal: Complications resolved/controlled Outcome: Progressing Goal: Tolerating diet Outcome: Progressing Goal: Activity appropriate for discharge plan Outcome: Progressing Goal: Other Discharge Outcomes/Goals Outcome: Progressing

## 2023-08-31 DIAGNOSIS — G9341 Metabolic encephalopathy: Secondary | ICD-10-CM | POA: Diagnosis not present

## 2023-08-31 LAB — GLUCOSE, CAPILLARY
Glucose-Capillary: 171 mg/dL — ABNORMAL HIGH (ref 70–99)
Glucose-Capillary: 269 mg/dL — ABNORMAL HIGH (ref 70–99)
Glucose-Capillary: 140 mg/dL — ABNORMAL HIGH (ref 70–99)

## 2023-08-31 MED ORDER — LACTATED RINGERS IV BOLUS
1000.0000 mL | Freq: Once | INTRAVENOUS | Status: AC
  Administered 2023-08-31: 1000 mL via INTRAVENOUS

## 2023-08-31 NOTE — Plan of Care (Signed)
 Problem: Education: Goal: Knowledge of General Education information will improve Description: Including pain rating scale, medication(s)/side effects and non-pharmacologic comfort measures Outcome: Progressing   Problem: Health Behavior/Discharge Planning: Goal: Ability to manage health-related needs will improve Outcome: Progressing   Problem: Clinical Measurements: Goal: Ability to maintain clinical measurements within normal limits will improve Outcome: Progressing Goal: Will remain free from infection Outcome: Progressing Goal: Diagnostic test results will improve Outcome: Progressing Goal: Respiratory complications will improve Outcome: Progressing Goal: Cardiovascular complication will be avoided Outcome: Progressing   Problem: Activity: Goal: Risk for activity intolerance will decrease Outcome: Progressing   Problem: Nutrition: Goal: Adequate nutrition will be maintained Outcome: Progressing   Problem: Coping: Goal: Level of anxiety will decrease Outcome: Progressing   Problem: Elimination: Goal: Will not experience complications related to bowel motility Outcome: Progressing Goal: Will not experience complications related to urinary retention Outcome: Progressing   Problem: Pain Managment: Goal: General experience of comfort will improve and/or be controlled Outcome: Progressing   Problem: Safety: Goal: Ability to remain free from injury will improve Outcome: Progressing   Problem: Skin Integrity: Goal: Risk for impaired skin integrity will decrease Outcome: Progressing   Problem: Cardiac: Goal: Ability to achieve and maintain adequate cardiovascular perfusion will improve Outcome: Progressing   Problem: Education: Goal: Ability to describe self-care measures that may prevent or decrease complications (Diabetes Survival Skills Education) will improve Outcome: Progressing Goal: Individualized Educational Video(s) Outcome: Progressing   Problem:  Coping: Goal: Ability to adjust to condition or change in health will improve Outcome: Progressing   Problem: Fluid Volume: Goal: Ability to maintain a balanced intake and output will improve Outcome: Progressing   Problem: Health Behavior/Discharge Planning: Goal: Ability to identify and utilize available resources and services will improve Outcome: Progressing Goal: Ability to manage health-related needs will improve Outcome: Progressing   Problem: Metabolic: Goal: Ability to maintain appropriate glucose levels will improve Outcome: Progressing   Problem: Nutritional: Goal: Maintenance of adequate nutrition will improve Outcome: Progressing Goal: Progress toward achieving an optimal weight will improve Outcome: Progressing   Problem: Skin Integrity: Goal: Risk for impaired skin integrity will decrease Outcome: Progressing   Problem: Tissue Perfusion: Goal: Adequacy of tissue perfusion will improve Outcome: Progressing   Problem: Consults Goal: Venous Thromboembolism Patient Education Description: See Patient Education Module for education specifics. Outcome: Progressing Goal: Diagnosis - Venous Thromboembolism (VTE) Description: Choose a selection Outcome: Progressing Goal: Pharmacy Consult for anticoagulation Outcome: Progressing Goal: Skin Care Protocol Initiated - if Braden Score 18 or less Description: If consults are not indicated, leave blank or document N/A Outcome: Progressing Goal: Nutrition Consult-if indicated Outcome: Progressing Goal: Diabetes Guidelines if Diabetic/Glucose > 140 Description: If diabetic or lab glucose is > 140 mg/dl - Initiate Diabetes/Hyperglycemia Guidelines & Document Interventions  Outcome: Progressing   Problem: Phase I Progression Outcomes Goal: Pain controlled with appropriate interventions Outcome: Progressing Goal: Dyspnea controlled at rest (PE) Outcome: Progressing Goal: Tolerating diet Outcome: Progressing Goal:  Initial discharge plan identified Outcome: Progressing Goal: Voiding-avoid urinary catheter unless indicated Outcome: Progressing Goal: Hemodynamically stable Outcome: Progressing Goal: Other Phase I Outcomes/Goals Outcome: Progressing   Problem: Phase II Progression Outcomes Goal: Therapeutic drug levels for anticoagulation Outcome: Progressing Goal: 02 sats trending upward/stable (PE) Outcome: Progressing Goal: Discharge plan established Outcome: Progressing Goal: Tolerating diet Outcome: Progressing Goal: Other Phase II Outcomes/Goals Outcome: Progressing   Problem: Phase III Progression Outcomes Goal: 02 sats stabilized Outcome: Progressing Goal: Activity at appropriate level-compared to baseline Description: (UP IN CHAIR FOR HEMODIALYSIS) Outcome:  Progressing Goal: Discharge plan remains appropriate-arrangements made Outcome: Progressing Goal: Other Phase III Outcomes/Goals Outcome: Progressing   Problem: Discharge Progression Outcomes Goal: Barriers To Progression Addressed/Resolved Outcome: Progressing Goal: Discharge plan in place and appropriate Outcome: Progressing Goal: Pain controlled with appropriate interventions Outcome: Progressing Goal: Hemodynamically stable Outcome: Progressing Goal: Complications resolved/controlled Outcome: Progressing Goal: Tolerating diet Outcome: Progressing Goal: Activity appropriate for discharge plan Outcome: Progressing Goal: Other Discharge Outcomes/Goals Outcome: Progressing

## 2023-08-31 NOTE — PMR Pre-admission (Signed)
 PMR Admission Coordinator Pre-Admission Assessment  Patient: Sarah Davis is an 63 y.o., female MRN: [id number] DOB: 1960-10-25 Height: 5' 5 (165.1 cm) Weight: 54 kg             Insurance Information HMO:     PPO:      PCP:      IPA:      80/20:      OTHER: [place] [employer] Medicaid PRIMARY: [employer]      Policy#: [id number]      Subscriber: pt CM Name: ***      Phone#: ***     Fax#: [phone] for initial review Pre-Cert#: [id number] fax to be confirmed for concurrent review [phone]      Employer:  Benefits:  Phone #: [phone]     Name: 7/11 Eff. Date: 08/19/22     Deduct: none      Out of Pocket Max: none      Life Max: none  CIR: 100%      SNF: 100% but no indication for facilities that have contractual agreement Outpatient: 100#     Co-Pay: no indication for facilities with contracts Home Health: 100%      Co-Pay: no indication for agencies with contractual agreements DME: 100%     Co-Pay:  Providers: in network  SECONDARY: none      Policy#:       Phone#:   Artist:       Phone#:   The Data processing manager" for patients in Inpatient Rehabilitation Facilities with attached "Privacy Act Statement-Health Care Records" was provided and verbally reviewed with: Patient and Family  Emergency Contact Information Contact Information     Name Relation Home Work Mobile   Ruth,Linda Mother [phone]  [phone]   Ange,August Daughter   [phone]   Lyndel Milling    [phone]   [facility] Sister   [phone]      Other Contacts   None on File    Current Medical History  Patient Admitting Diagnosis: Metabolic encephalopathy and ICU myopathy  History of Present Illness:  Sarah Davis is a 63 y.o. R handed  female  with PMHx of: NSTEMI with stenting; bipolar d/o; CKD 3 a; COPD; hx of CVA, neuropathy; new dx of hypothyroidism during this hospitalization admitted to [facility] on 07/28/23 with metabolic encephalopathy due  to hypoglycemia- given D 10 gtt.Multiple ER visits recently for altered mental status. However once she woke up, was determined to have NSTEMI and transferred to [facility] on 07/30/23.  She underwent a cardiac Cath 07/31/23 and found to have chronic LAD occlusion.However she developed Acute resp failure and needed intubation emergently at the end of cardiac cath. She also developed worsening of AMS.She underwent EEG- was negative, MRI was (-) for anything acute; she stayed sedated and finally found an ammonia level of 250 on 6/16.  - started lactulose  and carnitine.6/17- ammonia dropped into 70's with treatment.  However by 6/21, her LFTs started to increase due to carnitine; by 6/25- speaking in phrases and 6/27 transferred out of ICU and still on TFs via Cortrak. 7/1- Cortrak removed due to advance of diet.7/2- her WBC up to 18k; found to have UTI- started on Rocephin . But had recurrent urinary retention, so foley, which had been removed, was put back in. 7/6- her ABX was changed to Meropenem  due to ESBL Klebsiella. Question of NASH.Also had acute on chronic CHF treatment; but limited due to hypotension. On 7/11 patient had a slip/fall form bed. CT head negative.  No other injuries found. 7/13 with asymptomatic hypotension, improved with fluid challenge.  Patient's medical record from [facility] and [facility] have been reviewed by the rehabilitation admission coordinator and physician.  Past Medical History  Past Medical History:  Diagnosis Date   Anxiety    Bipolar 2 disorder (HCC)    Chronic hepatitis C virus genotype 1 infection (HCC)    Chronic pancreatitis (HCC)    CKD (chronic kidney disease) stage 4, GFR 15-29 ml/min (HCC)    COPD (chronic obstructive pulmonary disease) (HCC)    Coronary artery disease 11/2019   s/p PTCI  LCx & RCA. Complicated by Vfib arrest   DM (diabetes mellitus), type 2 (HCC)    HFrEF (heart failure with reduced ejection fraction) (HCC) 08/01/2023   EF  45-50%   Hyperparathyroidism, secondary (HCC)    Hypothyroid    Lithium toxicity    Marijuana abuse    NSTEMI (non-ST elevated myocardial infarction) (HCC) 07/30/2023   med management   Peripheral autonomic neuropathy due to DM [facility])    STEMI (ST elevation myocardial infarction) (HCC) 2021   Stroke [facility])    right hemiparesis   Substance abuse (HCC)    Tardive dyskinesia    Tobacco abuse    Has the patient had major surgery during 100 days prior to admission? No  Family History  family history includes Diabetes in her father.  Current Medications   Current Facility-Administered Medications:    acetaminophen  (TYLENOL ) tablet 650 mg, 650 mg, Oral, Q4H PRN, Arrien, Mauricio Daniel, MD, 650 mg at 08/25/23 2215   albuterol  (PROVENTIL ) (2.5 MG/3ML) 0.083% nebulizer solution 2.5 mg, 2.5 mg, Nebulization, Q4H PRN, Hunsucker, Donnice SAUNDERS, MD   arformoterol  (BROVANA ) nebulizer solution 15 mcg, 15 mcg, Nebulization, BID, Hunsucker, Donnice SAUNDERS, MD, 15 mcg at 08/31/23 0757   aspirin  chewable tablet 81 mg, 81 mg, Oral, Daily, Arrien, Mauricio Daniel, MD, 81 mg at 08/31/23 1015   busPIRone  (BUSPAR ) tablet 15 mg, 15 mg, Oral, BID, McCarty, Artie, MD, 15 mg at 08/31/23 1014   Chlorhexidine  Gluconate Cloth 2 % PADS 6 each, 6 each, Topical, Daily, Hunsucker, Donnice SAUNDERS, MD, 6 each at 08/31/23 1016   enoxaparin  (LOVENOX ) injection 40 mg, 40 mg, Subcutaneous, QHS, Arrien, Mauricio Daniel, MD, 40 mg at 08/30/23 2027   feeding supplement (ENSURE PLUS HIGH PROTEIN) liquid 237 mL, 237 mL, Oral, BID BM, Arrien, Mauricio Daniel, MD, 237 mL at 08/31/23 1016   folic acid  (FOLVITE ) tablet 1 mg, 1 mg, Oral, Daily, Arrien, Mauricio Daniel, MD, 1 mg at 08/31/23 1014   insulin  aspart (novoLOG ) injection 0-9 Units, 0-9 Units, Subcutaneous, TID WC, Arrien, Mauricio Daniel, MD, 2 Units at 08/31/23 1015   insulin  aspart (novoLOG ) injection 5 Units, 5 Units, Subcutaneous, TID WC, Joseph, Preetha, MD, 5 Units at 08/31/23 1015    insulin  glargine-yfgn (SEMGLEE ) injection 12 Units, 12 Units, Subcutaneous, Daily, Fairy Frames, MD, 12 Units at 08/31/23 1016   lactulose  (CHRONULAC ) 10 GM/15ML solution 10 g, 10 g, Oral, BID, Fairy Frames, MD, 10 g at 08/31/23 1013   lamoTRIgine  (LAMICTAL ) tablet 25 mg, 25 mg, Oral, Daily, 25 mg at 08/31/23 1014 **FOLLOWED BY** [START ON 09/10/2023] lamoTRIgine  (LAMICTAL ) tablet 50 mg, 50 mg, Oral, Daily **FOLLOWED BY** [START ON 09/24/2023] lamoTRIgine  (LAMICTAL ) tablet 100 mg, 100 mg, Oral, Daily, Cornelius Dines, MD   levothyroxine  (SYNTHROID ) tablet 100 mcg, 100 mcg, Oral, q morning, Arrien, Mauricio Daniel, MD, 100 mcg at 08/31/23 0520   multivitamin with minerals tablet 1 tablet,  1 tablet, Oral, Daily, Arrien, Elidia Sieving, MD, 1 tablet at 08/31/23 1014   nitroGLYCERIN  (NITROSTAT ) SL tablet 0.4 mg, 0.4 mg, Sublingual, Q5 Min x 3 PRN, Hunsucker, Donnice SAUNDERS, MD   ondansetron  (ZOFRAN ) injection 4 mg, 4 mg, Intravenous, Q6H PRN, Hunsucker, Donnice SAUNDERS, MD   Oral care mouth rinse, 15 mL, Mouth Rinse, PRN, Hunsucker, Donnice SAUNDERS, MD   Oral care mouth rinse, 15 mL, Mouth Rinse, PRN, Hunsucker, Donnice SAUNDERS, MD   Oral care mouth rinse, 15 mL, Mouth Rinse, Q4H, Hunsucker, Donnice SAUNDERS, MD, 15 mL at 08/31/23 1017   revefenacin  (YUPELRI ) nebulizer solution 175 mcg, 175 mcg, Nebulization, Daily, Hunsucker, Donnice SAUNDERS, MD, 175 mcg at 08/31/23 0757   sodium chloride  flush (NS) 0.9 % injection 3 mL, 3 mL, Intravenous, PRN, Hunsucker, Donnice SAUNDERS, MD   tamsulosin  (FLOMAX ) capsule 0.4 mg, 0.4 mg, Oral, Daily, Fairy Frames, MD, 0.4 mg at 08/31/23 1015   thiamine  (VITAMIN B1) tablet 100 mg, 100 mg, Oral, Daily, Arrien, Mauricio Daniel, MD, 100 mg at 08/31/23 1014   traZODone  (DESYREL ) tablet 50 mg, 50 mg, Oral, QHS, McCarty, Artie, MD, 50 mg at 08/30/23 2027  Patients Current Diet:  Diet Order             Diet regular Room service appropriate? Yes with Assist; Fluid consistency: Thin  Diet effective now                   Precautions / Restrictions Precautions Precautions: Fall Precaution/Restrictions Comments: fecal incontinence Restrictions Weight Bearing Restrictions Per Provider Order: No   Has the patient had 2 or more falls or a fall with injury in the past year?No  Prior Activity Level Limited Community (1-2x/wk): Mod I with RW. Did not drive  Prior Functional Level Prior Function Prior Level of Function : Needs assist Mobility Comments: walking with RW, limited distance, doesn't drive ADLs Comments: family and friends doing grocery shopping and cleaning, able to manage ADLs though difficult at times  Self Care: Did the patient need help bathing, dressing, using the toilet or eating?  Independent  Indoor Mobility: Did the patient need assistance with walking from room to room (with or without device)? Independent  Stairs: Did the patient need assistance with internal or external stairs (with or without device)? Independent  Functional Cognition: Did the patient need help planning regular tasks such as shopping or remembering to take medications? Independent  Patient Information Are you of Hispanic, Latino/a,or Spanish origin?: A. No, not of Hispanic, Latino/a, or Spanish origin What is your race?: A. White Do you need or want an interpreter to communicate with a doctor or health care staff?: 0. No  Patient's Response To:  Health Literacy and Transportation Is the patient able to respond to health literacy and transportation needs?: Yes Health Literacy - How often do you need to have someone help you when you read instructions, pamphlets, or other written material from your doctor or pharmacy?: Sometimes In the past 12 months, has lack of transportation kept you from medical appointments or from getting medications?: No In the past 12 months, has lack of transportation kept you from meetings, work, or from getting things needed for daily living?: No  Home Assistive Devices  / Equipment Home Equipment: Agricultural consultant (2 wheels), Other (comment) (knee scooter)  Prior Device Use: Indicate devices/aids used by the patient prior to current illness, exacerbation or injury? Walker  Current Functional Level Cognition  Orientation Level: Oriented to person, Oriented to time, Disoriented to  place, Disoriented to situation    Extremity Assessment (includes Sensation/Coordination)  Upper Extremity Assessment: Generalized weakness, Right hand dominant, LUE deficits/detail, RUE deficits/detail RUE Deficits / Details: history of CVA but moving this UE well in comparison to LUE LUE Deficits / Details: difficulty with shoulder flexion- reports weakness, no pain. requiring AAROM to lift for UB ADLs.  Lower Extremity Assessment: Defer to PT evaluation RLE Deficits / Details: pt able to wiggle toes bil, PROM WFL no active quad or hamstring activation despite cueing and assist LLE Deficits / Details: pt able to wiggle toes bil, PROM WFL no active quad or hamstring activation despite cueing and assist    ADLs  Overall ADL's : Needs assistance/impaired Eating/Feeding: Supervision/ safety, Bed level Eating/Feeding Details (indicate cue type and reason): Supervision w/ cues to avoid rapid swallowing from straw. holding cup with L hand Grooming: Supervision/safety, Bed level, Wash/dry face, Minimal assistance, Applying deodorant Grooming Details (indicate cue type and reason): Washing face easily- often when cued to wash other body parts, pt automatically washing face again. Assist to lift LUE for RUE to don deodorant, able to don under RUE without issues Upper Body Bathing: Moderate assistance, Bed level Upper Body Bathing Details (indicate cue type and reason): with cues, pt able to bathe underarms with assist to lift LUE. able to minimally wash chest/arms with cues/assist to manage the rest Lower Body Bathing: Moderate assistance, Bed level Lower Body Bathing Details (indicate cue  type and reason): pt able to wash B upper LE and anterior peri region in bed with cues. Upper Body Dressing : Moderate assistance, Bed level Lower Body Dressing: Moderate assistance, Bed level Lower Body Dressing Details (indicate cue type and reason): With OT donning socks on B toes, pt able to reach, bring feet to self to pull up over heel Toileting- Clothing Manipulation and Hygiene: Total assistance, Bed level General ADL Comments: Emphasis on bed level ADL tasks in chair position.    Mobility  Overal bed mobility: Needs Assistance Bed Mobility: Rolling Rolling: Supervision Sidelying to sit: Mod assist, +2 for safety/equipment Supine to sit: Max assist Sit to supine: Total assist General bed mobility comments: pt in floor upon PT entry, assist back to bed with nursing staff via lift; pt scooting up in bed using rails with mod cues and min A bed in trendelenberg    Transfers  Overall transfer level: Needs assistance Equipment used: 2 person hand held assist Transfers: Bed to chair/wheelchair/BSC Sit to Stand: Mod assist, +2 physical assistance Bed to/from chair/wheelchair/BSC transfer type:: Via Lift equipment Stand pivot transfers: Max assist, +2 physical assistance Transfer via Lift Equipment: Maximove General transfer comment: in floor upon PT entry, assisted to bed via lift; RN in to assess, replace foley bag and for hygiene due to fecal incontinence    Ambulation / Gait / Stairs / Wheelchair Mobility  Ambulation/Gait Ambulation/Gait assistance: Mod assist, Max assist, +2 physical assistance Gait Distance (Feet): 1 Feet Assistive device:  (hands on bedside table) Gait Pattern/deviations: Decreased dorsiflexion - right, Decreased dorsiflexion - left, Step-to pattern, Shuffle, Trunk flexed General Gait Details: flexed with arms on bedside table. took 2 steps prior to needing to sit with mod cues for hip extension and max A for balance/safety, limited due to continued fecal  incontinence    Posture / Balance Dynamic Sitting Balance Sitting balance - Comments: can sit for a few minutes with CGA to S then one episode pushing back needing mod to max A to recover Balance Overall balance assessment:  Needs assistance Sitting-balance support: Feet supported Sitting balance-Leahy Scale: Poor Sitting balance - Comments: can sit for a few minutes with CGA to S then one episode pushing back needing mod to max A to recover Postural control: Posterior lean Standing balance support: Bilateral upper extremity supported Standing balance-Leahy Scale: Zero Standing balance comment: mod to max A standing with UE support    Special needs/care consideration Fall/Slip without injury 7/11 Indwelling catheter 7/4\bowel incontinence Pressure injury to sacrum note 6/21   Previous Home Environment Living Arrangements: Alone  Lives With: Alone Available Help at Discharge:  (at d/c to dtr's home, [place], with Mom, [name] and family 24/7) Type of Home:  (low income Apt in [place]) Home Layout: One level Home Access: Elevator Bathroom Shower/Tub: Engineer, manufacturing systems: Standard Bathroom Accessibility: Yes How Accessible: Accessible via walker Home Care Services: No Additional Comments: lives in 4th floor apartment. can go to daughter's house which is single story with 6 steps to enter  Discharge Living Setting Plans for Discharge Living Setting: Lives with (comment), House (dtr's home, [place]) Type of Home at Discharge: House Discharge Home Layout: One level Discharge Home Access: Stairs to enter Entrance Stairs-Rails: Can reach both Entrance Stairs-Number of Steps: 6 Discharge Bathroom Shower/Tub: Tub/shower unit Discharge Bathroom Toilet: Standard Discharge Bathroom Accessibility: Yes How Accessible: Accessible via walker Does the patient have any problems obtaining your medications?: No  Social/Family/Support Systems Patient Roles: Parent Contact  Information: Justice [name] and dtr, August Anticipated Caregiver: Mom , dtr and other family members Anticipated Caregiver's Contact Information: see contacts Ability/Limitations of Caregiver: Dtr works from home, MOm will come to her home daily to asisst, dtr checking on PCS services Caregiver Availability: 24/7 Discharge Plan Discussed with Primary Caregiver: Yes Is Caregiver In Agreement with Plan?: Yes Does Caregiver/Family have Issues with Lodging/Transportation while Pt is in Rehab?: No  Goals Patient/Family Goal for Rehab: min assist with PT, OT and SLP Expected length of stay: ELOS 2.5 to 3 weeks Additional Information: Mom and DTr aware that SNF will not be able to be obtained after CIR admit due to [facility]. ONly option is to go home from CIR. I have had extensive discussions. Pt/Family Agrees to Admission and willing to participate: Yes Program Orientation Provided & Reviewed with Pt/Caregiver Including Roles  & Responsibilities: Yes Additional Information Needs: Dtr seems to be overwhelmed with Mom's condition  Decrease burden of Care through IP rehab admission: n/a  Possible need for SNF placement upon discharge:extensive discussions with Mom and dtr that SNF is not an option due to payor, [facility]   Patient Condition: This patient's condition remains as documented in the consult dated 08/28/23, in which the Rehabilitation Physician determined and documented that the patient's condition is appropriate for intensive rehabilitative care in an inpatient rehabilitation facility. Will admit to inpatient rehab today.  Preadmission Screen Completed By:  Alison Heron Lot, RN MSN 08/31/2023 12:28 PM ______________________________________________________________________   Discussed status with Dr. PIERRETTEon***at *** and received approval for admission today.  Admission Coordinator:  Alison Heron Lot, RN MSN time***/Date***

## 2023-08-31 NOTE — Progress Notes (Signed)
 BP taken after LR bolus was administered, = 90/66. Provider was notified. Will continue to monitor.

## 2023-08-31 NOTE — Progress Notes (Signed)
 PROGRESS NOTE    Sarah Davis  FMW:[id number] DOB: 02/07/1961 DOA: 07/30/2023 PCP: Patient, No Pcp Per   Brief Narrative:  Sarah Davis was transferred from [facility] to [facility] for cardiac catheterization.  62/F w history of bipolar, CKD, COPD, coronary artery disease, history of CVA, neuropathy and hypothyroidism, who was admitted to [facility] 07/28/23 with the working diagnosis of acute metabolic encephalopathy due to hypoglycemia.Treated with D10 drip.   She was found to have NSTEMI, prompting her transfer. Subsequently underwent cardiac catheterization 07/31/2023 noted to have chronic occlusion of mid LAD, medical management recommended. Post procedure she had worsening mentation, and  acute respiratory failure. Required intubation emergently. She transferred to the intensive care unit.   6/12 - CT negative for LVO, MRI negative for acute findings, advanced chronic microvascular ischemia,  no seizures on EEG.  6/14 remains somnolent.  6/16 unresponsive, ammonia back in 250 range; started carnitine, increased lactulose  6/17 ammonia back in 70s, still minimally responsive, remaining off sedation 6/21 LFTs elevated 6/23 no improvement in mental status, MRI ok, carnitine stopped as rising LFTs, possible DILI 6/24 spoke 1-2 words 6/25 speaking in phrases 6/26 held brief conversation -6/27, transferred from ICU to [facility] service -6/28 slowly improving mentation, tolerating well tube feedings.  -6/29 mentation continues to improve, per her family at the bedside she is not yet back to her baseline.  -6/30 advanced diet, with improvement in swallow dysfunction.  -7/01 Cortrak removed.  -7/2: WBC upto 18K, foley removed, UA suggestive of infection> started ceftriaxone  - 7/4, recurrent retention, Foley replaced - 7/6, urine culture growing Klebsiella, mental status improving - 7/6 urine culture growing ESBL Klebsiella, ceftriaxone  changed to meropenem  - 7/8 Antibiotics complete - 7/11 Patient  had slip/fall - CT head negative - no other complaints/exam without notable physical findings 7/13 asymptomatic hypotension, fluid challenge improved SBP to 100  Assessment & Plan:   Principal Problem:   Acute metabolic encephalopathy Active Problems:   Coronary artery disease   CKD stage 3a, GFR 45-59 ml/min (HCC)   Malnutrition of moderate degree   Hypothyroidism   Chronic anemia   Type 2 diabetes mellitus (HCC)  Acute metabolic encephalopathy - Multifactorial, likely related to hyperammonemia, severe hypoglycemia on original admission.  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  - Discussed case with psych, current medications likely not planning a role in her hyperammonemia, resumed per their recommendations  - Lamictal  25 mg daily -increase every 2 weeks as tolerated/for symptom management  - Trazodone  50 mg *low threshold to discontinue if mental status changes* - Cortrak removed, tolerating dysphagia diet - Discharge planning, TOC following, CIR evaluating   Leukocytosis Catheter related UTI, resolved Urinary retention - Catheter originally placed for retention on 6/29, noted to have some dysuria, foul smelling urine and worsening leukocytosis 7/2, UA abnormal - Initially treated with IV ceftriaxone , urine culture with 100,000 colonies of ESBL Klebsiella, transitioned to Meropenem  - stop date 08/26/23 -Recurrent episode of retention -> Foley replaced 7/3, routine catheter care -Consider voiding trial once UTI treatment completed   Acute on chronic systolic and diastolic CHF - Echocardiogram - EF 45 to 50%, mild LVH, RV systolic function preserved, small pericardial effusion, mild aortic stenosis; LV hypokinetic apical septal segment, apical anterior segment, apical inferior segment, and apex.  - Now appears euvolemic(off diuretics), GDMT limited by hypotension - Monitor off diuretics for now, add  PRN at DC   Ambulatory dysfunction,  acute on chronic, POA  - Continue to work with PT OT as able - 2+ assist/lift required recently - Fall evening 7/11 -CT head negative, physical exam otherwise negative for pain, no indication for further radiation exposure/imaging at this time  Coronary artery disease NSTEMI with chronic occlusion of LAD.  Continue aspirin .,  Resume statin after discharge    CKD stage 3a, GFR 45-59 ml/min (HCC) AKI, hypernatremia,, hypokalemia.  - AKI has resolved   Malnutrition of moderate degree Continue nutritional supplements.    Hypothyroidism Continue levothyroxine     Chronic anemia Stable, monitor   Type 2 diabetes mellitus (HCC) CBGs are stable, continue glargine and SSI   DVT prophylaxis: enoxaparin  (LOVENOX ) injection 40 mg Start: 08/19/23 2200 SCD's Start: 07/31/23 1526 Code Status:   Code Status: Full Code Family Communication: None present  Status is: Inpatient  Dispo: The patient is from: Home              Anticipated d/c is to: To be determined              Anticipated d/c date is: To be determined              Patient currently is medically stable for discharge  Consultants:  Psych, cardiology  Procedures:  Intubation/extubation, EEG, echo  Antimicrobials:  Imipenem stop date 08/28/2023  Subjective: No acute issues or events overnight  Objective: Vitals:   08/31/23 0000 08/31/23 0245 08/31/23 0347 08/31/23 0351  BP: (!) 86/68 90/66  106/76  Pulse: (!) 107 73  (!) 109  Resp: 18 17  18   Temp: 98.7 F (37.1 C) 98.5 F (36.9 C)  98.1 F (36.7 C)  TempSrc: Oral Axillary  Oral  SpO2: 93%   100%  Weight:   54 kg   Height:        Intake/Output Summary (Last 24 hours) at 08/31/2023 0735 Last data filed at 08/31/2023 0346 Gross per 24 hour  Intake 1243 ml  Output 550 ml  Net 693 ml   Filed Weights   08/27/23 0500 08/30/23 0500 08/31/23 0347  Weight: 52 kg 54 kg 54 kg    Examination:  General:  Pleasantly resting in  bed, No acute distress. HEENT:  Normocephalic atraumatic.  Sclerae nonicteric, noninjected.  Extraocular movements intact bilaterally. Neck:  Without mass or deformity.  Trachea is midline. Lungs:  Clear to auscultate bilaterally without rhonchi, wheeze, or rales. Heart:  Regular rate and rhythm.  Without murmurs, rubs, or gallops. Abdomen:  Soft, nontender, nondistended.  Without guarding or rebound. Extremities: Without cyanosis, clubbing, edema, or obvious deformity. Skin:  Warm and dry  Data Reviewed: I have personally reviewed following labs and imaging studies  CBC: Recent Labs  Lab 08/26/23 0419 08/27/23 0236 08/28/23 0504  WBC 9.5 10.8* 9.1  HGB 9.8* 10.2* 10.3*  HCT 31.9* 33.9* 33.5*  MCV 87.6 87.8 86.6  PLT 403* 330 344   Basic Metabolic Panel: Recent Labs  Lab 08/25/23 0415 08/26/23 0419 08/27/23 1441  NA 137 138 136  K 4.1 4.1 4.0  CL 102 103 96*  CO2 25 27 27   GLUCOSE 154* 124* 221*  BUN 22 27* 23  CREATININE 0.88 0.82 0.95  CALCIUM  9.0 9.2 9.4   GFR: Estimated Creatinine Clearance: 51.7 mL/min (by C-G formula based on SCr of 0.95 mg/dL).  Recent Labs  Lab 08/27/23 0808  AMMONIA 20   CBG: Recent Labs  Lab 08/30/23 0729 08/30/23 1134 08/30/23 1305 08/30/23 1610 08/30/23 2011  GLUCAP 152*  205* 221* 114* 253*    No results found for this or any previous visit (from the past 240 hours).        Radiology Studies: CT HEAD WO CONTRAST ( ) Result Date: 08/29/2023 CLINICAL DATA:  Initial evaluation for acute head trauma. EXAM: CT HEAD WITHOUT CONTRAST TECHNIQUE: Contiguous axial images were obtained from the base of the skull through the vertex without intravenous contrast. RADIATION DOSE REDUCTION: This exam was performed according to the departmental dose-optimization program which includes automated exposure control, adjustment of the mA and/or kV according to patient size and/or use of iterative reconstruction technique. COMPARISON:  Prior  CT from 08/07/2023. FINDINGS: Brain: Cerebral volume within normal limits. Mild chronic microvascular ischemic disease for age. No acute intracranial hemorrhage. No acute large vessel territory infarct. No mass lesion or midline shift. No hydrocephalus or extra-axial fluid collection. Vascular: No abnormal hyperdense vessel. Scattered vascular calcifications noted within the carotid siphons. Skull: Scalp soft tissues demonstrate no acute finding. Calvarium intact. Sinuses/Orbits: Punctate optic drusen bodies noted. Globes orbital soft tissues otherwise unremarkable. Paranasal sinuses are clear. No mastoid effusion. Other: None. IMPRESSION: 1. No acute intracranial abnormality. 2. Mild chronic microvascular ischemic disease for age. Electronically Signed   By: Morene Hoard M.D.   On: 08/29/2023 20:50        Scheduled Meds:  arformoterol   15 mcg Nebulization BID   aspirin   81 mg Oral Daily   busPIRone   15 mg Oral BID   Chlorhexidine  Gluconate Cloth  6 each Topical Daily   enoxaparin  (LOVENOX ) injection  40 mg Subcutaneous QHS   feeding supplement  237 mL Oral BID BM   folic acid   1 mg Oral Daily   insulin  aspart  0-9 Units Subcutaneous TID WC   insulin  aspart  5 Units Subcutaneous TID WC   insulin  glargine-yfgn  12 Units Subcutaneous Daily   lactulose   10 g Oral BID   lamoTRIgine   25 mg Oral Daily   Followed by   NOREEN ON 09/10/2023] lamoTRIgine   50 mg Oral Daily   Followed by   NOREEN ON 09/24/2023] lamoTRIgine   100 mg Oral Daily   levothyroxine   100 mcg Oral q morning   multivitamin with minerals  1 tablet Oral Daily   mouth rinse  15 mL Mouth Rinse Q4H   revefenacin   175 mcg Nebulization Daily   tamsulosin   0.4 mg Oral Daily   thiamine   100 mg Oral Daily   traZODone   50 mg Oral QHS   Continuous Infusions:   LOS: 32 days   Time spent:  Elsie JAYSON Mont[place], DO [facility]  If 7PM-7AM, please contact night-coverage [email or web address]  08/31/2023, 7:35 AM

## 2023-08-31 NOTE — Progress Notes (Signed)
 CHG Bath plus foley care done.

## 2023-08-31 NOTE — Progress Notes (Signed)
 [facility] night cross cover note:   I was notified by the patient's RN that the patient's blood pressure is running slightly softer relative to blood pressures on day shift.  Specifically, blood pressure around 2000 this evening was 87/66 with heart rate of 106, compared to the systolic blood pressures throughout dayshift, which were noted to be in the 90s, with heart rates in the high 80s to low 100s.  Other vital signs at this time appear stable, including afebrile, with respiratory rate 17-18, and oxygen saturation in the mid to high 90s on room air.  RN conveys no new or worsening symptoms associated with these lower blood pressures.  I subsequently ordered a 500 cc LR bolus, following which there was no significant change in blood pressure or heart rate.  I then ordered a 1 L LR bolus, following which her pressure has increased to 106/76, with heart rate similar to prior.  Most recent oxygen saturation after these ivf's is noted to be 100% on RA.     Eva Pore, DO Hospitalist

## 2023-09-01 ENCOUNTER — Encounter (HOSPITAL_COMMUNITY): Payer: Self-pay | Admitting: Physical Medicine and Rehabilitation

## 2023-09-01 ENCOUNTER — Other Ambulatory Visit: Payer: Self-pay

## 2023-09-01 ENCOUNTER — Inpatient Hospital Stay (HOSPITAL_COMMUNITY)
Admission: AD | Admit: 2023-09-01 | Discharge: 2023-09-23 | DRG: 945 | Disposition: A | Discharge status: Home Health | Payer: MEDICAID | Source: Intra-hospital | Attending: Physical Medicine and Rehabilitation | Admitting: Physical Medicine and Rehabilitation

## 2023-09-01 DIAGNOSIS — Z7984 Long term (current) use of oral hypoglycemic drugs: Secondary | ICD-10-CM

## 2023-09-01 DIAGNOSIS — L89150 Pressure ulcer of sacral region, unstageable: Secondary | ICD-10-CM | POA: Diagnosis present

## 2023-09-01 DIAGNOSIS — K625 Hemorrhage of anus and rectum: Secondary | ICD-10-CM | POA: Diagnosis not present

## 2023-09-01 DIAGNOSIS — E440 Moderate protein-calorie malnutrition: Secondary | ICD-10-CM | POA: Diagnosis present

## 2023-09-01 DIAGNOSIS — E1143 Type 2 diabetes mellitus with diabetic autonomic (poly)neuropathy: Secondary | ICD-10-CM | POA: Diagnosis present

## 2023-09-01 DIAGNOSIS — R4189 Other symptoms and signs involving cognitive functions and awareness: Secondary | ICD-10-CM | POA: Diagnosis present

## 2023-09-01 DIAGNOSIS — N1831 Chronic kidney disease, stage 3a: Secondary | ICD-10-CM | POA: Diagnosis not present

## 2023-09-01 DIAGNOSIS — I69351 Hemiplegia and hemiparesis following cerebral infarction affecting right dominant side: Secondary | ICD-10-CM | POA: Diagnosis not present

## 2023-09-01 DIAGNOSIS — D631 Anemia in chronic kidney disease: Secondary | ICD-10-CM | POA: Diagnosis present

## 2023-09-01 DIAGNOSIS — F3181 Bipolar II disorder: Secondary | ICD-10-CM | POA: Diagnosis present

## 2023-09-01 DIAGNOSIS — I214 Non-ST elevation (NSTEMI) myocardial infarction: Secondary | ICD-10-CM | POA: Diagnosis present

## 2023-09-01 DIAGNOSIS — Z716 Tobacco abuse counseling: Secondary | ICD-10-CM

## 2023-09-01 DIAGNOSIS — R5381 Other malaise: Principal | ICD-10-CM | POA: Diagnosis present

## 2023-09-01 DIAGNOSIS — I959 Hypotension, unspecified: Secondary | ICD-10-CM | POA: Diagnosis present

## 2023-09-01 DIAGNOSIS — R000 Tachycardia, unspecified: Secondary | ICD-10-CM | POA: Diagnosis not present

## 2023-09-01 DIAGNOSIS — Z5320 Procedure and treatment not carried out because of patient's decision for unspecified reasons: Secondary | ICD-10-CM | POA: Diagnosis not present

## 2023-09-01 DIAGNOSIS — Z885 Allergy status to narcotic agent status: Secondary | ICD-10-CM

## 2023-09-01 DIAGNOSIS — N183 Chronic kidney disease, stage 3 unspecified: Secondary | ICD-10-CM | POA: Diagnosis present

## 2023-09-01 DIAGNOSIS — B961 Klebsiella pneumoniae [K. pneumoniae] as the cause of diseases classified elsewhere: Secondary | ICD-10-CM | POA: Diagnosis present

## 2023-09-01 DIAGNOSIS — Z794 Long term (current) use of insulin: Secondary | ICD-10-CM

## 2023-09-01 DIAGNOSIS — E860 Dehydration: Secondary | ICD-10-CM | POA: Diagnosis not present

## 2023-09-01 DIAGNOSIS — R159 Full incontinence of feces: Secondary | ICD-10-CM | POA: Diagnosis present

## 2023-09-01 DIAGNOSIS — N2581 Secondary hyperparathyroidism of renal origin: Secondary | ICD-10-CM | POA: Diagnosis present

## 2023-09-01 DIAGNOSIS — N179 Acute kidney failure, unspecified: Secondary | ICD-10-CM | POA: Diagnosis not present

## 2023-09-01 DIAGNOSIS — K861 Other chronic pancreatitis: Secondary | ICD-10-CM | POA: Diagnosis present

## 2023-09-01 DIAGNOSIS — Z1612 Extended spectrum beta lactamase (ESBL) resistance: Secondary | ICD-10-CM | POA: Diagnosis not present

## 2023-09-01 DIAGNOSIS — Z7982 Long term (current) use of aspirin: Secondary | ICD-10-CM

## 2023-09-01 DIAGNOSIS — B9629 Other Escherichia coli [E. coli] as the cause of diseases classified elsewhere: Secondary | ICD-10-CM

## 2023-09-01 DIAGNOSIS — D649 Anemia, unspecified: Secondary | ICD-10-CM | POA: Diagnosis present

## 2023-09-01 DIAGNOSIS — E119 Type 2 diabetes mellitus without complications: Secondary | ICD-10-CM

## 2023-09-01 DIAGNOSIS — I6522 Occlusion and stenosis of left carotid artery: Secondary | ICD-10-CM | POA: Diagnosis present

## 2023-09-01 DIAGNOSIS — E1122 Type 2 diabetes mellitus with diabetic chronic kidney disease: Secondary | ICD-10-CM | POA: Diagnosis present

## 2023-09-01 DIAGNOSIS — G7281 Critical illness myopathy: Secondary | ICD-10-CM | POA: Diagnosis present

## 2023-09-01 DIAGNOSIS — Z833 Family history of diabetes mellitus: Secondary | ICD-10-CM

## 2023-09-01 DIAGNOSIS — Z882 Allergy status to sulfonamides status: Secondary | ICD-10-CM

## 2023-09-01 DIAGNOSIS — B182 Chronic viral hepatitis C: Secondary | ICD-10-CM | POA: Diagnosis present

## 2023-09-01 DIAGNOSIS — F17210 Nicotine dependence, cigarettes, uncomplicated: Secondary | ICD-10-CM | POA: Diagnosis present

## 2023-09-01 DIAGNOSIS — R300 Dysuria: Secondary | ICD-10-CM | POA: Diagnosis not present

## 2023-09-01 DIAGNOSIS — G9341 Metabolic encephalopathy: Principal | ICD-10-CM | POA: Diagnosis present

## 2023-09-01 DIAGNOSIS — Z79890 Hormone replacement therapy (postmenopausal): Secondary | ICD-10-CM

## 2023-09-01 DIAGNOSIS — E039 Hypothyroidism, unspecified: Secondary | ICD-10-CM | POA: Diagnosis present

## 2023-09-01 DIAGNOSIS — E871 Hypo-osmolality and hyponatremia: Secondary | ICD-10-CM | POA: Diagnosis present

## 2023-09-01 DIAGNOSIS — G479 Sleep disorder, unspecified: Secondary | ICD-10-CM | POA: Diagnosis not present

## 2023-09-01 DIAGNOSIS — R471 Dysarthria and anarthria: Secondary | ICD-10-CM | POA: Diagnosis present

## 2023-09-01 DIAGNOSIS — Z8674 Personal history of sudden cardiac arrest: Secondary | ICD-10-CM | POA: Diagnosis not present

## 2023-09-01 DIAGNOSIS — N390 Urinary tract infection, site not specified: Secondary | ICD-10-CM | POA: Diagnosis present

## 2023-09-01 DIAGNOSIS — Z79899 Other long term (current) drug therapy: Secondary | ICD-10-CM

## 2023-09-01 DIAGNOSIS — I5033 Acute on chronic diastolic (congestive) heart failure: Secondary | ICD-10-CM | POA: Diagnosis present

## 2023-09-01 DIAGNOSIS — Z9181 History of falling: Secondary | ICD-10-CM

## 2023-09-01 DIAGNOSIS — N319 Neuromuscular dysfunction of bladder, unspecified: Secondary | ICD-10-CM | POA: Diagnosis present

## 2023-09-01 DIAGNOSIS — E1142 Type 2 diabetes mellitus with diabetic polyneuropathy: Secondary | ICD-10-CM | POA: Diagnosis not present

## 2023-09-01 DIAGNOSIS — Z888 Allergy status to other drugs, medicaments and biological substances status: Secondary | ICD-10-CM

## 2023-09-01 DIAGNOSIS — J449 Chronic obstructive pulmonary disease, unspecified: Secondary | ICD-10-CM | POA: Diagnosis present

## 2023-09-01 DIAGNOSIS — R339 Retention of urine, unspecified: Secondary | ICD-10-CM

## 2023-09-01 DIAGNOSIS — E7220 Disorder of urea cycle metabolism, unspecified: Secondary | ICD-10-CM | POA: Diagnosis present

## 2023-09-01 DIAGNOSIS — F1290 Cannabis use, unspecified, uncomplicated: Secondary | ICD-10-CM | POA: Diagnosis present

## 2023-09-01 DIAGNOSIS — Z9049 Acquired absence of other specified parts of digestive tract: Secondary | ICD-10-CM

## 2023-09-01 DIAGNOSIS — M25512 Pain in left shoulder: Secondary | ICD-10-CM

## 2023-09-01 DIAGNOSIS — I2510 Atherosclerotic heart disease of native coronary artery without angina pectoris: Secondary | ICD-10-CM | POA: Diagnosis present

## 2023-09-01 DIAGNOSIS — E785 Hyperlipidemia, unspecified: Secondary | ICD-10-CM | POA: Diagnosis present

## 2023-09-01 DIAGNOSIS — K5900 Constipation, unspecified: Secondary | ICD-10-CM | POA: Diagnosis present

## 2023-09-01 DIAGNOSIS — Z596 Low income: Secondary | ICD-10-CM

## 2023-09-01 DIAGNOSIS — F319 Bipolar disorder, unspecified: Secondary | ICD-10-CM | POA: Diagnosis present

## 2023-09-01 LAB — CBC
HCT: 31.6 % — ABNORMAL LOW (ref 36.0–46.0)
Hemoglobin: 10 g/dL — ABNORMAL LOW (ref 12.0–15.0)
MCH: 28.2 pg (ref 26.0–34.0)
MCHC: 31.6 g/dL (ref 30.0–36.0)
MCV: 89.3 fL (ref 80.0–100.0)
Platelets: 235 K/uL (ref 150–400)
RBC: 3.54 MIL/uL — ABNORMAL LOW (ref 3.87–5.11)
RDW: 18.6 % — ABNORMAL HIGH (ref 11.5–15.5)
WBC: 8 K/uL (ref 4.0–10.5)
nRBC: 0 % (ref 0.0–0.2)

## 2023-09-01 LAB — GLUCOSE, CAPILLARY
Glucose-Capillary: 148 mg/dL — ABNORMAL HIGH (ref 70–99)
Glucose-Capillary: 172 mg/dL — ABNORMAL HIGH (ref 70–99)
Glucose-Capillary: 188 mg/dL — ABNORMAL HIGH (ref 70–99)
Glucose-Capillary: 112 mg/dL — ABNORMAL HIGH (ref 70–99)
Glucose-Capillary: 187 mg/dL — ABNORMAL HIGH (ref 70–99)

## 2023-09-01 LAB — CREATININE, SERUM
Creatinine, Ser: 0.94 mg/dL (ref 0.44–1.00)
GFR, Estimated: >60 mL/min (ref >60)

## 2023-09-01 MED ORDER — ACETAMINOPHEN 325 MG PO TABS
650.0000 mg | ORAL_TABLET | ORAL | Status: DC | PRN
  Administered 2023-09-19: 650 mg via ORAL
  Filled 2023-09-01: qty 2

## 2023-09-01 MED ORDER — INSULIN ASPART 100 UNIT/ML IJ SOLN
0.0000 [IU] | Freq: Three times a day (TID) | INTRAMUSCULAR | Status: DC
  Administered 2023-09-01 – 2023-09-02 (×3): 2 [IU] via SUBCUTANEOUS
  Administered 2023-09-02: 1 [IU] via SUBCUTANEOUS
  Administered 2023-09-03 (×2): 2 [IU] via SUBCUTANEOUS
  Administered 2023-09-03 – 2023-09-04 (×2): 1 [IU] via SUBCUTANEOUS
  Administered 2023-09-04 – 2023-09-05 (×2): 2 [IU] via SUBCUTANEOUS
  Administered 2023-09-05 – 2023-09-07 (×2): 1 [IU] via SUBCUTANEOUS
  Administered 2023-09-07 – 2023-09-08 (×4): 2 [IU] via SUBCUTANEOUS
  Administered 2023-09-09: 3 [IU] via SUBCUTANEOUS
  Administered 2023-09-09 – 2023-09-10 (×3): 1 [IU] via SUBCUTANEOUS
  Administered 2023-09-10: 2 [IU] via SUBCUTANEOUS
  Administered 2023-09-11: 1 [IU] via SUBCUTANEOUS
  Administered 2023-09-11: 2 [IU] via SUBCUTANEOUS
  Administered 2023-09-12: 1 [IU] via SUBCUTANEOUS
  Administered 2023-09-12 – 2023-09-13 (×3): 2 [IU] via SUBCUTANEOUS
  Administered 2023-09-13: 1 [IU] via SUBCUTANEOUS
  Administered 2023-09-14 – 2023-09-15 (×4): 2 [IU] via SUBCUTANEOUS
  Administered 2023-09-15 – 2023-09-16 (×2): 1 [IU] via SUBCUTANEOUS
  Administered 2023-09-16 (×2): 2 [IU] via SUBCUTANEOUS
  Administered 2023-09-17: 5 [IU] via SUBCUTANEOUS
  Administered 2023-09-17 – 2023-09-18 (×2): 2 [IU] via SUBCUTANEOUS
  Administered 2023-09-18: 5 [IU] via SUBCUTANEOUS
  Administered 2023-09-19: 1 [IU] via SUBCUTANEOUS
  Administered 2023-09-19: 2 [IU] via SUBCUTANEOUS
  Administered 2023-09-20: 0 [IU] via SUBCUTANEOUS
  Administered 2023-09-20: 1 [IU] via SUBCUTANEOUS
  Administered 2023-09-20: 3 [IU] via SUBCUTANEOUS
  Administered 2023-09-21: 1 [IU] via SUBCUTANEOUS
  Administered 2023-09-21 (×2): 2 [IU] via SUBCUTANEOUS
  Administered 2023-09-22 (×2): 1 [IU] via SUBCUTANEOUS
  Administered 2023-09-23: 3 [IU] via SUBCUTANEOUS

## 2023-09-01 MED ORDER — ALBUTEROL SULFATE (2.5 MG/3ML) 0.083% IN NEBU
2.5000 mg | INHALATION_SOLUTION | RESPIRATORY_TRACT | Status: DC | PRN
Start: 2023-09-01 — End: 2023-09-23

## 2023-09-01 MED ORDER — CHLORHEXIDINE GLUCONATE CLOTH 2 % EX PADS: 6.0000 | MEDICATED_PAD | Freq: Every day | CUTANEOUS | Status: DC

## 2023-09-01 MED ORDER — ZINC SULFATE 220 (50 ZN) MG PO CAPS
220.0000 mg | ORAL_CAPSULE | Freq: Every day | ORAL | Status: DC
  Administered 2023-09-01: 220 mg via ORAL
  Filled 2023-09-01: qty 1

## 2023-09-01 MED ORDER — REVEFENACIN 175 MCG/3ML IN SOLN
175.0000 ug | Freq: Every day | RESPIRATORY_TRACT | Status: DC
  Administered 2023-09-02 – 2023-09-22 (×17): 175 ug via RESPIRATORY_TRACT
  Filled 2023-09-01 (×22): qty 3

## 2023-09-01 MED ORDER — TRAZODONE HCL 50 MG PO TABS
50.0000 mg | ORAL_TABLET | Freq: Every day | ORAL | Status: DC
  Administered 2023-09-01 – 2023-09-10 (×10): 50 mg via ORAL
  Filled 2023-09-01 (×10): qty 1

## 2023-09-01 MED ORDER — LACTULOSE 10 GM/15ML PO SOLN
10.0000 g | Freq: Two times a day (BID) | ORAL | Status: DC
  Administered 2023-09-01 – 2023-09-09 (×16): 10 g via ORAL
  Filled 2023-09-01 (×16): qty 15

## 2023-09-01 MED ORDER — ADULT MULTIVITAMIN W/MINERALS CH
1.0000 | ORAL_TABLET | Freq: Every day | ORAL | Status: DC
  Administered 2023-09-02 – 2023-09-23 (×22): 1 via ORAL
  Filled 2023-09-01 (×22): qty 1

## 2023-09-01 MED ORDER — ENSURE PLUS HIGH PROTEIN PO LIQD
237.0000 mL | Freq: Two times a day (BID) | ORAL | Status: DC
  Administered 2023-09-01 – 2023-09-22 (×31): 237 mL via ORAL

## 2023-09-01 MED ORDER — LAMOTRIGINE 100 MG PO TABS: 100.0000 mg | ORAL_TABLET | Freq: Every day | ORAL | Status: DC

## 2023-09-01 MED ORDER — TAMSULOSIN HCL 0.4 MG PO CAPS
0.4000 mg | ORAL_CAPSULE | Freq: Every day | ORAL | Status: DC
  Administered 2023-09-02 – 2023-09-03 (×2): 0.4 mg via ORAL
  Filled 2023-09-01 (×2): qty 1

## 2023-09-01 MED ORDER — THIAMINE MONONITRATE 100 MG PO TABS
100.0000 mg | ORAL_TABLET | Freq: Every day | ORAL | Status: DC
Start: 2023-09-02 — End: 2023-09-02
  Administered 2023-09-02: 100 mg via ORAL
  Filled 2023-09-01: qty 1

## 2023-09-01 MED ORDER — INSULIN ASPART 100 UNIT/ML IJ SOLN
5.0000 [IU] | Freq: Three times a day (TID) | INTRAMUSCULAR | Status: DC
  Administered 2023-09-01 – 2023-09-22 (×38): 5 [IU] via SUBCUTANEOUS

## 2023-09-01 MED ORDER — ARFORMOTEROL TARTRATE 15 MCG/2ML IN NEBU
15.0000 ug | INHALATION_SOLUTION | Freq: Two times a day (BID) | RESPIRATORY_TRACT | Status: DC
  Administered 2023-09-01 – 2023-09-21 (×35): 15 ug via RESPIRATORY_TRACT
  Filled 2023-09-01 (×40): qty 2

## 2023-09-01 MED ORDER — FOLIC ACID 1 MG PO TABS
1.0000 mg | ORAL_TABLET | Freq: Every day | ORAL | Status: DC
  Administered 2023-09-02 – 2023-09-23 (×22): 1 mg via ORAL
  Filled 2023-09-01 (×22): qty 1

## 2023-09-01 MED ORDER — ORAL CARE MOUTH RINSE
15.0000 mL | OROMUCOSAL | Status: DC | PRN
  Administered 2023-09-12: 15 mL via OROMUCOSAL

## 2023-09-01 MED ORDER — LEVOTHYROXINE SODIUM 100 MCG PO TABS
100.0000 ug | ORAL_TABLET | Freq: Every morning | ORAL | Status: DC
  Administered 2023-09-02 – 2023-09-23 (×22): 100 ug via ORAL
  Filled 2023-09-01 (×22): qty 1

## 2023-09-01 MED ORDER — VITAMIN C 500 MG PO TABS
1000.0000 mg | ORAL_TABLET | Freq: Every day | ORAL | Status: DC
  Administered 2023-09-01 – 2023-09-23 (×23): 1000 mg via ORAL
  Filled 2023-09-01 (×23): qty 2

## 2023-09-01 MED ORDER — ORAL CARE MOUTH RINSE: 15.0000 mL | OROMUCOSAL | Status: DC | PRN

## 2023-09-01 MED ORDER — BUSPIRONE HCL 15 MG PO TABS
15.0000 mg | ORAL_TABLET | Freq: Two times a day (BID) | ORAL | Status: DC
  Administered 2023-09-01 – 2023-09-23 (×44): 15 mg via ORAL
  Filled 2023-09-01 (×45): qty 1

## 2023-09-01 MED ORDER — CHLORHEXIDINE GLUCONATE CLOTH 2 % EX PADS
6.0000 | MEDICATED_PAD | Freq: Two times a day (BID) | CUTANEOUS | Status: DC
  Administered 2023-09-01 – 2023-09-02 (×3): 6 via TOPICAL

## 2023-09-01 MED ORDER — ASPIRIN 81 MG PO CHEW
81.0000 mg | CHEWABLE_TABLET | Freq: Every day | ORAL | Status: DC
  Administered 2023-09-02 – 2023-09-23 (×22): 81 mg via ORAL
  Filled 2023-09-01 (×22): qty 1

## 2023-09-01 MED ORDER — MEDIHONEY WOUND/BURN DRESSING EX PSTE
1.0000 | PASTE | Freq: Every day | CUTANEOUS | Status: DC
  Administered 2023-09-01 – 2023-09-02 (×2): 1 via TOPICAL
  Filled 2023-09-01: qty 44

## 2023-09-01 MED ORDER — ENOXAPARIN SODIUM 40 MG/0.4ML IJ SOSY: 40.0000 mg | PREFILLED_SYRINGE | INTRAMUSCULAR | Status: DC

## 2023-09-01 MED ORDER — ORAL CARE MOUTH RINSE
15.0000 mL | OROMUCOSAL | Status: DC
  Administered 2023-09-01 – 2023-09-23 (×88): 15 mL via OROMUCOSAL

## 2023-09-01 MED ORDER — NITROGLYCERIN 0.4 MG SL SUBL: 0.4000 mg | SUBLINGUAL_TABLET | SUBLINGUAL | Status: DC | PRN

## 2023-09-01 MED ORDER — LAMOTRIGINE 25 MG PO TABS
50.0000 mg | ORAL_TABLET | Freq: Every day | ORAL | Status: AC
  Administered 2023-09-10 – 2023-09-23 (×14): 50 mg via ORAL
  Filled 2023-09-01 (×15): qty 2

## 2023-09-01 MED ORDER — ENOXAPARIN SODIUM 40 MG/0.4ML IJ SOSY
40.0000 mg | PREFILLED_SYRINGE | Freq: Every day | INTRAMUSCULAR | Status: DC
  Administered 2023-09-01 – 2023-09-22 (×22): 40 mg via SUBCUTANEOUS
  Filled 2023-09-01 (×22): qty 0.4

## 2023-09-01 MED ORDER — INSULIN GLARGINE-YFGN 100 UNIT/ML ~~LOC~~ SOLN
12.0000 [IU] | Freq: Every day | SUBCUTANEOUS | Status: DC
  Administered 2023-09-02 – 2023-09-23 (×22): 12 [IU] via SUBCUTANEOUS
  Filled 2023-09-01 (×23): qty 0.12

## 2023-09-01 MED ORDER — LAMOTRIGINE 25 MG PO TABS
25.0000 mg | ORAL_TABLET | Freq: Every day | ORAL | Status: AC
  Administered 2023-09-02 – 2023-09-09 (×8): 25 mg via ORAL
  Filled 2023-09-01 (×8): qty 1

## 2023-09-01 NOTE — Plan of Care (Signed)
  Problem: Consults Goal: RH GENERAL PATIENT EDUCATION Description: See Patient Education module for education specifics. Outcome: Progressing   Problem: RH BOWEL ELIMINATION Goal: RH STG MANAGE BOWEL WITH ASSISTANCE Description: STG Manage Bowel with minimal  Assistance. Outcome: Progressing   Problem: RH BLADDER ELIMINATION Goal: RH STG MANAGE BLADDER WITH ASSISTANCE Description: STG Manage Bladder With minimal  Assistance Outcome: Progressing   Problem: RH SKIN INTEGRITY Goal: RH STG SKIN FREE OF INFECTION/BREAKDOWN Description: Manage skin free of infection/breakdown with minimal assistance Outcome: Progressing   Problem: RH SAFETY Goal: RH STG ADHERE TO SAFETY PRECAUTIONS W/ASSISTANCE/DEVICE Description: STG Adhere to Safety Precautions With minimal  Assistance/Device. Outcome: Progressing

## 2023-09-01 NOTE — H&P (Signed)
 Physical Medicine and Rehabilitation Admission H&P     HPI: Sarah Davis is a 63 year old right-handed female with past medical history of bipolar disorder on lamictal  and Buspar , CKD stage III, COPD tobacco use as well as marijuana, chronic pancreatitis, history of CVA with right hemiparesis, chronic hepatitis C virus genotype, diastolic congestive heart failure, diabetes mellitus with peripheral neuropathy, tardive dyskinesia.  Per chart review patient lives alone.  1 level apartment with elevator.  Uses a rolling walker for mobility.  She does not drive.  Family and friends do her grocery shopping and housecleaning.  Presented 07/28/2023 to [facility] with metabolic encephalopathy due to hypoglycemia given D10 gtt.  However as patient improved from hypoglycemic event found to have non-STEMI and transferred to [facility].  She underwent cardiac catheterization 07/31/2023 found to have chronic LAD.  Hospital course patient developed acute respiratory failure required intubation emergently at the end of catheterization.  She was also noted to have worsening altered mental status.  EEG was negative.  MRI negative for anything acute she stayed sedated and found to have ammonia level of 250 on 6/16 and started on lactulose  and carnitine.  Her ammonia levels did improve into the 70s with treatment however by 6/21 her LFTs started to increase due to carnitine and was transferred to the ICU.  A Cortrak was placed and diet slowly advanced to a regular consistency.  Developed leukocytosis 18,000 found to have UTI started on Rocephin  initially with and her antibiotics were changed to meropenem  due to ESBL Klebsiella and antibiotic regimen has been completed and remains on contact precautions.  Follow-up cardiology services again after non-STEMI as well as acute on chronic CHF treatment but limited due to hypotension.  She did sustain a fall on 7/11 when she slipped in her room cranial CT scan repeated that  again was negative.  She remains on Lovenox  for DVT prophylaxis.  Aspirin  ongoing after non-STEMI.  Therapy evaluations completed due to patient's debility was admitted for a comprehensive rehab program.  Review of Systems  Constitutional:  Negative for chills and fever.  HENT:  Negative for hearing loss.   Eyes:  Negative for blurred vision and double vision.  Respiratory:  Negative for cough, shortness of breath and wheezing.   Cardiovascular:  Positive for palpitations and leg swelling. Negative for chest pain.  Gastrointestinal:  Positive for blood in stool. Negative for heartburn, nausea and vomiting.  Genitourinary:  Negative for dysuria, flank pain and hematuria.  Musculoskeletal:  Positive for joint pain and myalgias.  Skin:  Negative for rash.  Psychiatric/Behavioral:         Anxiety/bipolar disorder  All other systems reviewed and are negative.  Past Medical History:  Diagnosis Date   Anxiety    Bipolar 2 disorder (HCC)    Chronic hepatitis C virus genotype 1 infection (HCC)    Chronic pancreatitis (HCC)    CKD (chronic kidney disease) stage 4, GFR 15-29 ml/min (HCC)    COPD (chronic obstructive pulmonary disease) (HCC)    Coronary artery disease 11/2019   s/p PTCI  LCx & RCA. Complicated by Vfib arrest   DM (diabetes mellitus), type 2 (HCC)    HFrEF (heart failure with reduced ejection fraction) (HCC) 08/01/2023   EF 45-50%   Hyperparathyroidism, secondary (HCC)    Hypothyroid    Lithium toxicity    Marijuana abuse    NSTEMI (non-ST elevated myocardial infarction) (HCC) 07/30/2023   med management   Peripheral autonomic neuropathy due to  DM (HCC)    STEMI (ST elevation myocardial infarction) (HCC) 2021   Stroke [facility])    right hemiparesis   Substance abuse (HCC)    Tardive dyskinesia    Tobacco abuse    Past Surgical History:  Procedure Laterality Date   CARDIAC CATHETERIZATION     CHOLECYSTECTOMY     LEFT HEART CATH AND CORONARY ANGIOGRAPHY N/A 07/31/2023    Patent LCx & RCA stents. Chronic occlussion mLAD with collaterals=med management   Family History  Problem Relation Age of Onset   Diabetes Father    Social History:  reports that she has been smoking cigarettes. She started smoking about 29 years ago. She has a 14.8 pack-year smoking history. She has never used smokeless tobacco. She reports current drug use. Drugs: Benzodiazepines and Marijuana. She reports that she does not drink alcohol. Allergies:  Allergies  Allergen Reactions   Lithium Anaphylaxis   Sulfa Antibiotics Rash   Codeine Rash   Medications Prior to Admission  Medication Sig Dispense Refill   aspirin  EC 81 MG tablet Take 81 mg by mouth in the morning.     atorvastatin  (LIPITOR) 80 MG tablet Take 80 mg by mouth at bedtime.     busPIRone  (BUSPAR ) 15 MG tablet Take 15 mg by mouth 2 (two) times daily.     furosemide  (LASIX ) 20 MG tablet Take 20 mg by mouth once a week. On Mondays     gabapentin  (NEURONTIN ) 300 MG capsule Take 300-600 mg by mouth at bedtime.      lamoTRIgine  (LAMICTAL ) 100 MG tablet Take 100 mg by mouth at bedtime.  0   LANTUS  SOLOSTAR 100 UNIT/ML Solostar Pen Inject 30 Units into the skin daily with lunch.     levothyroxine  (SYNTHROID ) 100 MCG tablet Take 100 mcg by mouth every morning.     nitroGLYCERIN  (NITROSTAT ) 0.4 MG SL tablet Place 0.4 mg under the tongue every 5 (five) minutes as needed for chest pain.     paliperidone (INVEGA) 3 MG 24 hr tablet Take 3 mg by mouth at bedtime.     traZODone  (DESYREL ) 100 MG tablet Take 400 mg by mouth at bedtime.     Vitamin D , Ergocalciferol , (DRISDOL) 1.25 MG (50000 UNIT) CAPS capsule Take 50,000 Units by mouth once a week. On Mondays        Home: Home Living Family/patient expects to be discharged to:: Private residence Living Arrangements: Alone Available Help at Discharge:  (at d/c to dtr's home, [place], with Mom, [name] and family 24/7) Type of Home:  (low income Apt in [place]) Home Access:  Elevator Home Layout: One level Bathroom Shower/Tub: Engineer, manufacturing systems: Standard Bathroom Accessibility: Yes Home Equipment: Agricultural consultant (2 wheels), Other (comment) (knee scooter) Additional Comments: lives in 4th floor apartment. can go to daughter's house which is single story with 6 steps to enter  Lives With: Alone   Functional History: Prior Function Prior Level of Function : Needs assist Mobility Comments: walking with RW, limited distance, doesn't drive ADLs Comments: family and friends doing grocery shopping and cleaning, able to manage ADLs though difficult at times  Functional Status:  Mobility: Bed Mobility Overal bed mobility: Needs Assistance Bed Mobility: Rolling Rolling: Supervision Sidelying to sit: Mod assist, +2 for safety/equipment Supine to sit: Max assist Sit to supine: Total assist General bed mobility comments: pt in floor upon PT entry, assist back to bed with nursing staff via lift; pt scooting up in bed using rails with mod cues and min  A bed in trendelenberg Transfers Overall transfer level: Needs assistance Equipment used: 2 person hand held assist Transfers: Bed to chair/wheelchair/BSC Sit to Stand: Mod assist, +2 physical assistance Bed to/from chair/wheelchair/BSC transfer type:: Via Lift equipment Stand pivot transfers: Max assist, +2 physical assistance Transfer via Lift Equipment: Maximove General transfer comment: in floor upon PT entry, assisted to bed via lift; RN in to assess, replace foley bag and for hygiene due to fecal incontinence Ambulation/Gait Ambulation/Gait assistance: Mod assist, Max assist, +2 physical assistance Gait Distance (Feet): 1 Feet Assistive device:  (hands on bedside table) Gait Pattern/deviations: Decreased dorsiflexion - right, Decreased dorsiflexion - left, Step-to pattern, Shuffle, Trunk flexed General Gait Details: flexed with arms on bedside table. took 2 steps prior to needing to sit with mod  cues for hip extension and max A for balance/safety, limited due to continued fecal incontinence    ADL: ADL Overall ADL's : Needs assistance/impaired Eating/Feeding: Supervision/ safety, Bed level Eating/Feeding Details (indicate cue type and reason): Supervision w/ cues to avoid rapid swallowing from straw. holding cup with L hand Grooming: Supervision/safety, Bed level, Wash/dry face, Minimal assistance, Applying deodorant Grooming Details (indicate cue type and reason): Washing face easily- often when cued to wash other body parts, pt automatically washing face again. Assist to lift LUE for RUE to don deodorant, able to don under RUE without issues Upper Body Bathing: Moderate assistance, Bed level Upper Body Bathing Details (indicate cue type and reason): with cues, pt able to bathe underarms with assist to lift LUE. able to minimally wash chest/arms with cues/assist to manage the rest Lower Body Bathing: Moderate assistance, Bed level Lower Body Bathing Details (indicate cue type and reason): pt able to wash B upper LE and anterior peri region in bed with cues. Upper Body Dressing : Moderate assistance, Bed level Lower Body Dressing: Moderate assistance, Bed level Lower Body Dressing Details (indicate cue type and reason): With OT donning socks on B toes, pt able to reach, bring feet to self to pull up over heel Toileting- Clothing Manipulation and Hygiene: Total assistance, Bed level General ADL Comments: Emphasis on bed level ADL tasks in chair position.  Cognition: Cognition Orientation Level: Oriented to person, Oriented to time, Disoriented to place, Disoriented to situation Cognition Arousal: Alert Behavior During Therapy: Flat affect  Physical Exam: Blood pressure 103/69, pulse 83, temperature 97.7 F (36.5 C), resp. rate 18, height 5' 5 (1.651 m), weight 54 kg, SpO2 100%. Physical Exam Constitutional:      General: She is not in acute distress.    Appearance: She is  ill-appearing.  HENT:     Head: Normocephalic and atraumatic.     Nose: Nose normal.     Mouth/Throat:     Mouth: Mucous membranes are moist.  Eyes:     Extraocular Movements: Extraocular movements intact.     Pupils: Pupils are equal, round, and reactive to light.  Cardiovascular:     Rate and Rhythm: Normal rate and regular rhythm.     Heart sounds: No murmur heard.    No gallop.  Pulmonary:     Effort: No respiratory distress.     Breath sounds: No stridor.  Abdominal:     General: Bowel sounds are normal. There is distension.  Musculoskeletal:        General: Tenderness present. No swelling.     Cervical back: Normal range of motion.  Skin:    Findings: Bruising (lower abdomen) present.     Comments: Abrasion left neck  Neurological:     Comments: Pt is alert. Oriented to self, [facility], not [place], knew month and year. Couldn't tell me why she was here. Couldn't provide address or consistent biographical information. CN non focal. Speech clear, normal language. BUE motor 4- prox to 4/5 distally with tremor. LE 2+ to 3-/5 prox to 4/5 distally. Sensory exam normal for light touch and pain in all 4 limbs. No limb ataxia or cerebellar signs. No abnormal tone appreciated.    Psychiatric:     Comments: Pt pleasant but flat.      Results for orders placed or performed during the hospital encounter of 07/30/23 (from the past 48 hours)  Glucose, capillary     Status: Abnormal   Collection Time: 08/30/23 11:34 AM  Result Value Ref Range   Glucose-Capillary 205 (H) 70 - 99 mg/dL    Comment: Glucose reference range applies only to samples taken after fasting for at least 8 hours.  Glucose, capillary     Status: Abnormal   Collection Time: 08/30/23  1:05 PM  Result Value Ref Range   Glucose-Capillary 221 (H) 70 - 99 mg/dL    Comment: Glucose reference range applies only to samples taken after fasting for at least 8 hours.  Glucose, capillary     Status: Abnormal   Collection Time:  08/30/23  4:10 PM  Result Value Ref Range   Glucose-Capillary 114 (H) 70 - 99 mg/dL    Comment: Glucose reference range applies only to samples taken after fasting for at least 8 hours.  Glucose, capillary     Status: Abnormal   Collection Time: 08/30/23  8:11 PM  Result Value Ref Range   Glucose-Capillary 253 (H) 70 - 99 mg/dL    Comment: Glucose reference range applies only to samples taken after fasting for at least 8 hours.  Glucose, capillary     Status: Abnormal   Collection Time: 08/31/23  8:11 AM  Result Value Ref Range   Glucose-Capillary 171 (H) 70 - 99 mg/dL    Comment: Glucose reference range applies only to samples taken after fasting for at least 8 hours.  Glucose, capillary     Status: Abnormal   Collection Time: 08/31/23 11:55 AM  Result Value Ref Range   Glucose-Capillary 269 (H) 70 - 99 mg/dL    Comment: Glucose reference range applies only to samples taken after fasting for at least 8 hours.  Glucose, capillary     Status: Abnormal   Collection Time: 08/31/23  4:27 PM  Result Value Ref Range   Glucose-Capillary 140 (H) 70 - 99 mg/dL    Comment: Glucose reference range applies only to samples taken after fasting for at least 8 hours.  Glucose, capillary     Status: Abnormal   Collection Time: 09/01/23  8:33 AM  Result Value Ref Range   Glucose-Capillary 148 (H) 70 - 99 mg/dL    Comment: Glucose reference range applies only to samples taken after fasting for at least 8 hours.   No results found.    Blood pressure 103/69, pulse 83, temperature 97.7 F (36.5 C), resp. rate 18, height 5' 5 (1.651 m), weight 54 kg, SpO2 100%.  Medical Problem List and Plan: 1. Functional deficits secondary to metabolic encephalopathy/ICU myopathy  -patient may  shower  -ELOS/Goals: 14-17 days, min assist goals 2.  Antithrombotics: -DVT/anticoagulation:  Pharmaceutical: Lovenox   -antiplatelet therapy: Aspirin  81 mg daily 3. Pain Management: Tylenol  as needed 4.  Mood/Behavior/Sleep/bipolar disorder: BuSpar  15 mg twice daily, Lamictal   titrating to 100 mg daily  -antipsychotic agents: N/A 5. Neuropsych/cognition: This patient is not capable of making decisions on her own behalf. 6. Skin/Wound Care: Routine skin checks 7. Fluids/Electrolytes/Nutrition: Routine in and outs with follow-up chemistries 8.  CAD/Non-STEMI/acute on chronic diastolic congestive heart failure.  Cardiac catheterization found to have chronic LAD.  Continue aspirin  therapy 9.  Diabetes mellitus with peripheral neuropathy.  Hemoglobin A1c 4.8.  NovoLog  5 units 3 times daily, Semglee  12 units daily  -sugars under fair control at present 10.  Leukocytosis/UTI.  Antibiotic therapy completed 11.  Hyperammonemia.  Continue Chronulac  10 mg twice daily.  Follow-up ammonia levels  -last ammonia level 20 on 7/9 12.  Hypothyroidism.  Synthroid  13.  Documented CKD stage III.  Latest creatinine 0.95.  Follow-up chemistries.  Follow-up with [facility] nephrology 14.  Chronic anemia.  Follow-up CBC 15.  Decreased nutritional storage.  Diet advanced to regular.  Follow-up dietary services  -intake poor/inconsistent  -engage RD for assistance  16.  COPD/tobacco use/marijuana use.  Continue inhalers as directed.  Check oxygen saturations every shift.  Urine drug screen negative.  Provide counseling  Toribio JINNY Pitch, PA-C 09/01/2023

## 2023-09-01 NOTE — H&P (Signed)
 Physical Medicine and Rehabilitation Admission H&P       HPI: Sarah Davis is a 63 year old right-handed female with past medical history of bipolar disorder on lamictal  and Buspar , CKD stage III, COPD tobacco use as well as marijuana, chronic pancreatitis, history of CVA with right hemiparesis, chronic hepatitis C virus genotype, diastolic congestive heart failure, diabetes mellitus with peripheral neuropathy, tardive dyskinesia.  Per chart review patient lives alone.  1 level apartment with elevator.  Uses a rolling walker for mobility.  She does not drive.  Family and friends do her grocery shopping and housecleaning.  Presented 07/28/2023 to [facility] with metabolic encephalopathy due to hypoglycemia given D10 gtt.  However as patient improved from hypoglycemic event found to have non-STEMI and transferred to [facility].  She underwent cardiac catheterization 07/31/2023 found to have chronic LAD.  Hospital course patient developed acute respiratory failure required intubation emergently at the end of catheterization.  She was also noted to have worsening altered mental status.  EEG was negative.  MRI negative for anything acute she stayed sedated and found to have ammonia level of 250 on 6/16 and started on lactulose  and carnitine.  Her ammonia levels did improve into the 70s with treatment however by 6/21 her LFTs started to increase due to carnitine and was transferred to the ICU.  A Cortrak was placed and diet slowly advanced to a regular consistency.  Developed leukocytosis 18,000 found to have UTI started on Rocephin  initially with and her antibiotics were changed to meropenem  due to ESBL Klebsiella and antibiotic regimen has been completed and remains on contact precautions.  Follow-up cardiology services again after non-STEMI as well as acute on chronic CHF treatment but limited due to hypotension.  She did sustain a fall on 7/11 when she slipped in her room cranial CT scan repeated that  again was negative.  She remains on Lovenox  for DVT prophylaxis.  Aspirin  ongoing after non-STEMI.  Therapy evaluations completed due to patient's debility was admitted for a comprehensive rehab program.   Review of Systems  Constitutional:  Negative for chills and fever.  HENT:  Negative for hearing loss.   Eyes:  Negative for blurred vision and double vision.  Respiratory:  Negative for cough, shortness of breath and wheezing.   Cardiovascular:  Positive for palpitations and leg swelling. Negative for chest pain.  Gastrointestinal:  Positive for blood in stool. Negative for heartburn, nausea and vomiting.  Genitourinary:  Negative for dysuria, flank pain and hematuria.  Musculoskeletal:  Positive for joint pain and myalgias.  Skin:  Negative for rash.  Psychiatric/Behavioral:         Anxiety/bipolar disorder  All other systems reviewed and are negative.      Past Medical History:  Diagnosis Date   Anxiety     Bipolar 2 disorder (HCC)     Chronic hepatitis C virus genotype 1 infection (HCC)     Chronic pancreatitis (HCC)     CKD (chronic kidney disease) stage 4, GFR 15-29 ml/min (HCC)     COPD (chronic obstructive pulmonary disease) (HCC)     Coronary artery disease 11/2019    s/p PTCI  LCx & RCA. Complicated by Vfib arrest   DM (diabetes mellitus), type 2 (HCC)     HFrEF (heart failure with reduced ejection fraction) (HCC) 08/01/2023    EF 45-50%   Hyperparathyroidism, secondary (HCC)     Hypothyroid     Lithium toxicity     Marijuana abuse  NSTEMI (non-ST elevated myocardial infarction) (HCC) 07/30/2023    med management   Peripheral autonomic neuropathy due to DM [facility])     STEMI (ST elevation myocardial infarction) (HCC) 2021   Stroke [facility])      right hemiparesis   Substance abuse (HCC)     Tardive dyskinesia     Tobacco abuse               Past Surgical History:  Procedure Laterality Date   CARDIAC CATHETERIZATION       CHOLECYSTECTOMY       LEFT HEART CATH  AND CORONARY ANGIOGRAPHY N/A 07/31/2023    Patent LCx & RCA stents. Chronic occlussion mLAD with collaterals=med management             Family History  Problem Relation Age of Onset   Diabetes Father          Social History:  reports that she has been smoking cigarettes. She started smoking about 29 years ago. She has a 14.8 pack-year smoking history. She has never used smokeless tobacco. She reports current drug use. Drugs: Benzodiazepines and Marijuana. She reports that she does not drink alcohol. Allergies:  Allergies      Allergies  Allergen Reactions   Lithium Anaphylaxis   Sulfa Antibiotics Rash   Codeine Rash            Medications Prior to Admission  Medication Sig Dispense Refill   aspirin  EC 81 MG tablet Take 81 mg by mouth in the morning.       atorvastatin  (LIPITOR) 80 MG tablet Take 80 mg by mouth at bedtime.       busPIRone  (BUSPAR ) 15 MG tablet Take 15 mg by mouth 2 (two) times daily.       furosemide  (LASIX ) 20 MG tablet Take 20 mg by mouth once a week. On Mondays       gabapentin  (NEURONTIN ) 300 MG capsule Take 300-600 mg by mouth at bedtime.        lamoTRIgine  (LAMICTAL ) 100 MG tablet Take 100 mg by mouth at bedtime.   0   LANTUS  SOLOSTAR 100 UNIT/ML Solostar Pen Inject 30 Units into the skin daily with lunch.       levothyroxine  (SYNTHROID ) 100 MCG tablet Take 100 mcg by mouth every morning.       nitroGLYCERIN  (NITROSTAT ) 0.4 MG SL tablet Place 0.4 mg under the tongue every 5 (five) minutes as needed for chest pain.       paliperidone (INVEGA) 3 MG 24 hr tablet Take 3 mg by mouth at bedtime.       traZODone  (DESYREL ) 100 MG tablet Take 400 mg by mouth at bedtime.       Vitamin D , Ergocalciferol , (DRISDOL) 1.25 MG (50000 UNIT) CAPS capsule Take 50,000 Units by mouth once a week. On Mondays                  Home: Home Living Family/patient expects to be discharged to:: Private residence Living Arrangements: Alone Available Help at Discharge:  (at d/c  to dtr's home, [place], with Mom, [name] and family 24/7) Type of Home:  (low income Apt in [place]) Home Access: Elevator Home Layout: One level Bathroom Shower/Tub: Engineer, manufacturing systems: Standard Bathroom Accessibility: Yes Home Equipment: Agricultural consultant (2 wheels), Other (comment) (knee scooter) Additional Comments: lives in 4th floor apartment. can go to daughter's house which is single story with 6 steps to enter  Lives With: Alone   Functional History:  Prior Function Prior Level of Function : Needs assist Mobility Comments: walking with RW, limited distance, doesn't drive ADLs Comments: family and friends doing grocery shopping and cleaning, able to manage ADLs though difficult at times   Functional Status:  Mobility: Bed Mobility Overal bed mobility: Needs Assistance Bed Mobility: Rolling Rolling: Supervision Sidelying to sit: Mod assist, +2 for safety/equipment Supine to sit: Max assist Sit to supine: Total assist General bed mobility comments: pt in floor upon PT entry, assist back to bed with nursing staff via lift; pt scooting up in bed using rails with mod cues and min A bed in trendelenberg Transfers Overall transfer level: Needs assistance Equipment used: 2 person hand held assist Transfers: Bed to chair/wheelchair/BSC Sit to Stand: Mod assist, +2 physical assistance Bed to/from chair/wheelchair/BSC transfer type:: Via Lift equipment Stand pivot transfers: Max assist, +2 physical assistance Transfer via Lift Equipment: Maximove General transfer comment: in floor upon PT entry, assisted to bed via lift; RN in to assess, replace foley bag and for hygiene due to fecal incontinence Ambulation/Gait Ambulation/Gait assistance: Mod assist, Max assist, +2 physical assistance Gait Distance (Feet): 1 Feet Assistive device:  (hands on bedside table) Gait Pattern/deviations: Decreased dorsiflexion - right, Decreased dorsiflexion - left, Step-to pattern, Shuffle,  Trunk flexed General Gait Details: flexed with arms on bedside table. took 2 steps prior to needing to sit with mod cues for hip extension and max A for balance/safety, limited due to continued fecal incontinence   ADL: ADL Overall ADL's : Needs assistance/impaired Eating/Feeding: Supervision/ safety, Bed level Eating/Feeding Details (indicate cue type and reason): Supervision w/ cues to avoid rapid swallowing from straw. holding cup with L hand Grooming: Supervision/safety, Bed level, Wash/dry face, Minimal assistance, Applying deodorant Grooming Details (indicate cue type and reason): Washing face easily- often when cued to wash other body parts, pt automatically washing face again. Assist to lift LUE for RUE to don deodorant, able to don under RUE without issues Upper Body Bathing: Moderate assistance, Bed level Upper Body Bathing Details (indicate cue type and reason): with cues, pt able to bathe underarms with assist to lift LUE. able to minimally wash chest/arms with cues/assist to manage the rest Lower Body Bathing: Moderate assistance, Bed level Lower Body Bathing Details (indicate cue type and reason): pt able to wash B upper LE and anterior peri region in bed with cues. Upper Body Dressing : Moderate assistance, Bed level Lower Body Dressing: Moderate assistance, Bed level Lower Body Dressing Details (indicate cue type and reason): With OT donning socks on B toes, pt able to reach, bring feet to self to pull up over heel Toileting- Clothing Manipulation and Hygiene: Total assistance, Bed level General ADL Comments: Emphasis on bed level ADL tasks in chair position.   Cognition: Cognition Orientation Level: Oriented to person, Oriented to time, Disoriented to place, Disoriented to situation Cognition Arousal: Alert Behavior During Therapy: Flat affect   Physical Exam: Blood pressure 103/69, pulse 83, temperature 97.7 F (36.5 C), resp. rate 18, height 5' 5 (1.651 m), weight 54  kg, SpO2 100%. Physical Exam Constitutional:      General: She is not in acute distress.    Appearance: She is ill-appearing.  HENT:     Head: Normocephalic and atraumatic.     Nose: Nose normal.     Mouth/Throat:     Mouth: Mucous membranes are moist.  Eyes:     Extraocular Movements: Extraocular movements intact.     Pupils: Pupils are equal, round, and reactive  to light.  Cardiovascular:     Rate and Rhythm: Normal rate and regular rhythm.     Heart sounds: No murmur heard.    No gallop.  Pulmonary:     Effort: No respiratory distress.     Breath sounds: No stridor.  Abdominal:     General: Bowel sounds are normal. There is distension.  Musculoskeletal:        General: Tenderness present. No swelling.     Cervical back: Normal range of motion.  Skin:    Findings: Bruising (lower abdomen) present.     Comments: Abrasion left neck  Neurological:     Comments: Pt is alert. Oriented to self, [facility], not [place], knew month and year. Couldn't tell me why she was here. Couldn't provide address or consistent biographical information. CN non focal. Speech clear, normal language. BUE motor 4- prox to 4/5 distally with tremor. LE 2+ to 3-/5 prox to 4/5 distally. Sensory exam normal for light touch and pain in all 4 limbs. No limb ataxia or cerebellar signs. No abnormal tone appreciated.    Psychiatric:     Comments: Pt pleasant but flat.        Lab Results Last 48 Hours        Results for orders placed or performed during the hospital encounter of 07/30/23 (from the past 48 hours)  Glucose, capillary     Status: Abnormal    Collection Time: 08/30/23 11:34 AM  Result Value Ref Range    Glucose-Capillary 205 (H) 70 - 99 mg/dL      Comment: Glucose reference range applies only to samples taken after fasting for at least 8 hours.  Glucose, capillary     Status: Abnormal    Collection Time: 08/30/23  1:05 PM  Result Value Ref Range    Glucose-Capillary 221 (H) 70 - 99 mg/dL       Comment: Glucose reference range applies only to samples taken after fasting for at least 8 hours.  Glucose, capillary     Status: Abnormal    Collection Time: 08/30/23  4:10 PM  Result Value Ref Range    Glucose-Capillary 114 (H) 70 - 99 mg/dL      Comment: Glucose reference range applies only to samples taken after fasting for at least 8 hours.  Glucose, capillary     Status: Abnormal    Collection Time: 08/30/23  8:11 PM  Result Value Ref Range    Glucose-Capillary 253 (H) 70 - 99 mg/dL      Comment: Glucose reference range applies only to samples taken after fasting for at least 8 hours.  Glucose, capillary     Status: Abnormal    Collection Time: 08/31/23  8:11 AM  Result Value Ref Range    Glucose-Capillary 171 (H) 70 - 99 mg/dL      Comment: Glucose reference range applies only to samples taken after fasting for at least 8 hours.  Glucose, capillary     Status: Abnormal    Collection Time: 08/31/23 11:55 AM  Result Value Ref Range    Glucose-Capillary 269 (H) 70 - 99 mg/dL      Comment: Glucose reference range applies only to samples taken after fasting for at least 8 hours.  Glucose, capillary     Status: Abnormal    Collection Time: 08/31/23  4:27 PM  Result Value Ref Range    Glucose-Capillary 140 (H) 70 - 99 mg/dL      Comment: Glucose reference range applies only  to samples taken after fasting for at least 8 hours.  Glucose, capillary     Status: Abnormal    Collection Time: 09/01/23  8:33 AM  Result Value Ref Range    Glucose-Capillary 148 (H) 70 - 99 mg/dL      Comment: Glucose reference range applies only to samples taken after fasting for at least 8 hours.      Imaging Results (Last 48 hours)  No results found.         Blood pressure 103/69, pulse 83, temperature 97.7 F (36.5 C), resp. rate 18, height 5' 5 (1.651 m), weight 54 kg, SpO2 100%.   Medical Problem List and Plan: 1. Functional deficits secondary to metabolic encephalopathy/ICU myopathy              -patient may  shower             -ELOS/Goals: 14-17 days, min assist goals 2.  Antithrombotics: -DVT/anticoagulation:  Pharmaceutical: Lovenox              -antiplatelet therapy: Aspirin  81 mg daily 3. Pain Management: Tylenol  as needed 4. Mood/Behavior/Sleep/bipolar disorder: BuSpar  15 mg twice daily, Lamictal  titrating to 100 mg daily             -antipsychotic agents: N/A 5. Neuropsych/cognition: This patient is not capable of making decisions on her own behalf. 6. Skin/Wound Care: Routine skin checks 7. Fluids/Electrolytes/Nutrition: Routine in and outs with follow-up chemistries 8.  CAD/Non-STEMI/acute on chronic diastolic congestive heart failure.  Cardiac catheterization found to have chronic LAD.  Continue aspirin  therapy 9.  Diabetes mellitus with peripheral neuropathy.  Hemoglobin A1c 4.8.  NovoLog  5 units 3 times daily, Semglee  12 units daily             -sugars under fair control at present 10.  Leukocytosis/UTI.  Antibiotic therapy completed 11.  Hyperammonemia.  Continue Chronulac  10 mg twice daily.  Follow-up ammonia levels             -last ammonia level 20 on 7/9 12.  Hypothyroidism.  Synthroid  13.  Documented CKD stage III.  Latest creatinine 0.95.  Follow-up chemistries.  Follow-up with [facility] nephrology 14.  Chronic anemia.  Follow-up CBC 15.  Decreased nutritional storage.  Diet advanced to regular.  Follow-up dietary services             -intake poor/inconsistent             -engage RD for assistance  16.  COPD/tobacco use/marijuana use.  Continue inhalers as directed.  Check oxygen saturations every shift.  Urine drug screen negative.  Provide counseling   Toribio JINNY Pitch, PA-C 09/01/2023  I have personally performed a face to face diagnostic evaluation of this patient and formulated the key components of the plan.  Additionally, I have personally reviewed laboratory data, imaging studies, as well as relevant notes and concur with the physician assistant's  documentation above.  The patient's status has not changed from the original H&P.  Any changes in documentation from the acute care chart have been noted above.  Arthea IVAR Gunther, MD, [facility]

## 2023-09-01 NOTE — Progress Notes (Signed)
 Met with patient to review current situation, team conference and plan of care. Patient has poor initiation, needs help with feeding, alert to self, disoriented with situation, time and place. Patient with telesitter for safety. Will follow up when daughter is here.Continue to follow along to provide educational needs to facilitate preparation for discharge.

## 2023-09-01 NOTE — Progress Notes (Signed)
 PMR Admission Coordinator Pre-Admission Assessment   Patient: Sarah Davis is an 63 y.o., female MRN: [id number] DOB: 12-26-1960 Height: 5' 5 (165.1 cm) Weight: 54 kg                                                                                                                                      Insurance Information HMO:     PPO:      PCP:      IPA:      80/20:      OTHER: [place] Medicaid PRIMARY: [employer]      Policy#: [id number]      Subscriber: pt CM Name:       Phone#:      Fax#: [phone] for initial review Pre-Cert#: [id number], Ref # [id number] fax to be confirmed for concurrent review [phone]. Approved from 7/14-7/21 2025      Benefits:  Phone #: [phone]     Name: 7/11 Eff. Date: 08/19/22     Deduct: none      Out of Pocket Max: none      Life Max: none  CIR: 100%      SNF: 100% but no indication for facilities that have contractual agreement Outpatient: 100#     Co-Pay: no indication for facilities with contracts Home Health: 100%      Co-Pay: no indication for agencies with contractual agreements DME: 100%     Co-Pay:  Providers: in network  SECONDARY: none      Policy#:       Phone#:    Artist:       Phone#:    The Data processing manager" for patients in Inpatient Rehabilitation Facilities with attached "Privacy Act Statement-Health Care Records" was provided and verbally reviewed with: Patient and Family   Emergency Contact Information Contact Information       Name Relation Home Work Mobile    Ruth,Linda Mother [phone]   [phone]    Ange,August Daughter     [phone]    Lyndel Milling       [phone]    [facility] Sister     [phone]         Other Contacts   None on File      Current Medical History  Patient Admitting Diagnosis: Metabolic encephalopathy and ICU myopathy   History of Present Illness:  Sarah Davis is a 63 y.o. R handed  female  with PMHx of: NSTEMI with stenting;  bipolar d/o; CKD 3 a; COPD; hx of CVA, neuropathy; new dx of hypothyroidism during this hospitalization admitted to [facility] on 07/28/23 with metabolic encephalopathy due to hypoglycemia- given D 10 gtt.Multiple ER visits recently for altered mental status. However once she woke up, was determined to have NSTEMI and transferred to [facility] on 07/30/23.   She underwent a cardiac Cath 07/31/23 and found to have chronic LAD occlusion.However she developed Acute resp failure  and needed intubation emergently at the end of cardiac cath. She also developed worsening of AMS.She underwent EEG- was negative, MRI was (-) for anything acute; she stayed sedated and finally found an ammonia level of 250 on 6/16.  - started lactulose  and carnitine.6/17- ammonia dropped into 70's with treatment.  However by 6/21, her LFTs started to increase due to carnitine; by 6/25- speaking in phrases and 6/27 transferred out of ICU and still on TFs via Cortrak. 7/1- Cortrak removed due to advance of diet.7/2- her WBC up to 18k; found to have UTI- started on Rocephin . But had recurrent urinary retention, so foley, which had been removed, was put back in. 7/6- her ABX was changed to Meropenem  due to ESBL Klebsiella. Question of NASH.Also had acute on chronic CHF treatment; but limited due to hypotension. On 7/11 patient had a slip/fall form bed. CT head negative. No other injuries found. Telesitter in room. 7/13 with asymptomatic hypotension, improved with fluid challenge.   Patient's medical record from [facility] and [facility] have been reviewed by the rehabilitation admission coordinator and physician.   Past Medical History      Past Medical History:  Diagnosis Date   Anxiety     Bipolar 2 disorder (HCC)     Chronic hepatitis C virus genotype 1 infection (HCC)     Chronic pancreatitis (HCC)     CKD (chronic kidney disease) stage 4, GFR 15-29 ml/min (HCC)     COPD (chronic obstructive pulmonary disease) (HCC)      Coronary artery disease 11/2019    s/p PTCI  LCx & RCA. Complicated by Vfib arrest   DM (diabetes mellitus), type 2 (HCC)     HFrEF (heart failure with reduced ejection fraction) (HCC) 08/01/2023    EF 45-50%   Hyperparathyroidism, secondary (HCC)     Hypothyroid     Lithium toxicity     Marijuana abuse     NSTEMI (non-ST elevated myocardial infarction) (HCC) 07/30/2023    med management   Peripheral autonomic neuropathy due to DM [facility])     STEMI (ST elevation myocardial infarction) (HCC) 2021   Stroke [facility])      right hemiparesis   Substance abuse (HCC)     Tardive dyskinesia     Tobacco abuse          Has the patient had major surgery during 100 days prior to admission? No   Family History  family history includes Diabetes in her father.   Current Medications   Current Medications    Current Facility-Administered Medications:    acetaminophen  (TYLENOL ) tablet 650 mg, 650 mg, Oral, Q4H PRN, Arrien, Mauricio Daniel, MD, 650 mg at 08/25/23 2215   albuterol  (PROVENTIL ) (2.5 MG/3ML) 0.083% nebulizer solution 2.5 mg, 2.5 mg, Nebulization, Q4H PRN, Hunsucker, Donnice SAUNDERS, MD   arformoterol  (BROVANA ) nebulizer solution 15 mcg, 15 mcg, Nebulization, BID, Hunsucker, Donnice SAUNDERS, MD, 15 mcg at 08/31/23 0757   aspirin  chewable tablet 81 mg, 81 mg, Oral, Daily, Arrien, Mauricio Daniel, MD, 81 mg at 08/31/23 1015   busPIRone  (BUSPAR ) tablet 15 mg, 15 mg, Oral, BID, McCarty, Artie, MD, 15 mg at 08/31/23 1014   Chlorhexidine  Gluconate Cloth 2 % PADS 6 each, 6 each, Topical, Daily, Hunsucker, Donnice SAUNDERS, MD, 6 each at 08/31/23 1016   enoxaparin  (LOVENOX ) injection 40 mg, 40 mg, Subcutaneous, QHS, Arrien, Mauricio Daniel, MD, 40 mg at 08/30/23 2027   feeding supplement (ENSURE PLUS HIGH PROTEIN) liquid 237 mL, 237 mL, Oral, BID  BM, Arrien, Elidia Sieving, MD, 237 mL at 08/31/23 1016   folic acid  (FOLVITE ) tablet 1 mg, 1 mg, Oral, Daily, Arrien, Mauricio Daniel, MD, 1 mg at 08/31/23 1014    insulin  aspart (novoLOG ) injection 0-9 Units, 0-9 Units, Subcutaneous, TID WC, Arrien, Mauricio Daniel, MD, 2 Units at 08/31/23 1015   insulin  aspart (novoLOG ) injection 5 Units, 5 Units, Subcutaneous, TID WC, Joseph, Preetha, MD, 5 Units at 08/31/23 1015   insulin  glargine-yfgn (SEMGLEE ) injection 12 Units, 12 Units, Subcutaneous, Daily, Joseph, Preetha, MD, 12 Units at 08/31/23 1016   lactulose  (CHRONULAC ) 10 GM/15ML solution 10 g, 10 g, Oral, BID, Fairy Frames, MD, 10 g at 08/31/23 1013   lamoTRIgine  (LAMICTAL ) tablet 25 mg, 25 mg, Oral, Daily, 25 mg at 08/31/23 1014 **FOLLOWED BY** [START ON 09/10/2023] lamoTRIgine  (LAMICTAL ) tablet 50 mg, 50 mg, Oral, Daily **FOLLOWED BY** [START ON 09/24/2023] lamoTRIgine  (LAMICTAL ) tablet 100 mg, 100 mg, Oral, Daily, Cornelius, Artie, MD   levothyroxine  (SYNTHROID ) tablet 100 mcg, 100 mcg, Oral, q morning, Arrien, Mauricio Daniel, MD, 100 mcg at 08/31/23 0520   multivitamin with minerals tablet 1 tablet, 1 tablet, Oral, Daily, Arrien, Mauricio Daniel, MD, 1 tablet at 08/31/23 1014   nitroGLYCERIN  (NITROSTAT ) SL tablet 0.4 mg, 0.4 mg, Sublingual, Q5 Min x 3 PRN, Hunsucker, Donnice SAUNDERS, MD   ondansetron  (ZOFRAN ) injection 4 mg, 4 mg, Intravenous, Q6H PRN, Hunsucker, Donnice SAUNDERS, MD   Oral care mouth rinse, 15 mL, Mouth Rinse, PRN, Hunsucker, Donnice SAUNDERS, MD   Oral care mouth rinse, 15 mL, Mouth Rinse, PRN, Hunsucker, Donnice SAUNDERS, MD   Oral care mouth rinse, 15 mL, Mouth Rinse, Q4H, Hunsucker, Donnice SAUNDERS, MD, 15 mL at 08/31/23 1017   revefenacin  (YUPELRI ) nebulizer solution 175 mcg, 175 mcg, Nebulization, Daily, Hunsucker, Donnice SAUNDERS, MD, 175 mcg at 08/31/23 0757   sodium chloride  flush (NS) 0.9 % injection 3 mL, 3 mL, Intravenous, PRN, Hunsucker, Donnice SAUNDERS, MD   tamsulosin  (FLOMAX ) capsule 0.4 mg, 0.4 mg, Oral, Daily, Fairy Frames, MD, 0.4 mg at 08/31/23 1015   thiamine  (VITAMIN B1) tablet 100 mg, 100 mg, Oral, Daily, Arrien, Mauricio Daniel, MD, 100 mg at 08/31/23 1014    traZODone  (DESYREL ) tablet 50 mg, 50 mg, Oral, QHS, McCarty, Artie, MD, 50 mg at 08/30/23 2027     Patients Current Diet:  Diet Order                  Diet regular Room service appropriate? Yes with Assist; Fluid consistency: Thin  Diet effective now                       Precautions / Restrictions Precautions Precautions: Fall Precaution/Restrictions Comments: fecal incontinence Restrictions Weight Bearing Restrictions Per Provider Order: No    Has the patient had 2 or more falls or a fall with injury in the past year?No   Prior Activity Level Limited Community (1-2x/wk): Mod I with RW. Did not drive   Prior Functional Level Prior Function Prior Level of Function : Needs assist Mobility Comments: walking with RW, limited distance, doesn't drive ADLs Comments: family and friends doing grocery shopping and cleaning, able to manage ADLs though difficult at times   Self Care: Did the patient need help bathing, dressing, using the toilet or eating?  Independent   Indoor Mobility: Did the patient need assistance with walking from room to room (with or without device)? Independent   Stairs: Did the patient need assistance with internal or external stairs (  with or without device)? Independent   Functional Cognition: Did the patient need help planning regular tasks such as shopping or remembering to take medications? Independent   Patient Information Are you of Hispanic, Latino/a,or Spanish origin?: A. No, not of Hispanic, Latino/a, or Spanish origin What is your race?: A. White Do you need or want an interpreter to communicate with a doctor or health care staff?: 0. No   Patient's Response To:  Health Literacy and Transportation Is the patient able to respond to health literacy and transportation needs?: Yes Health Literacy - How often do you need to have someone help you when you read instructions, pamphlets, or other written material from your doctor or pharmacy?:  Sometimes In the past 12 months, has lack of transportation kept you from medical appointments or from getting medications?: No In the past 12 months, has lack of transportation kept you from meetings, work, or from getting things needed for daily living?: No   Home Assistive Devices / Equipment Home Equipment: Agricultural consultant (2 wheels), Other (comment) (knee scooter)   Prior Device Use: Indicate devices/aids used by the patient prior to current illness, exacerbation or injury? Walker   Current Functional Level Cognition   Orientation Level: Oriented to person, Oriented to time, Disoriented to place, Disoriented to situation    Extremity Assessment (includes Sensation/Coordination)   Upper Extremity Assessment: Generalized weakness, Right hand dominant, LUE deficits/detail, RUE deficits/detail RUE Deficits / Details: history of CVA but moving this UE well in comparison to LUE LUE Deficits / Details: difficulty with shoulder flexion- reports weakness, no pain. requiring AAROM to lift for UB ADLs.  Lower Extremity Assessment: Defer to PT evaluation RLE Deficits / Details: pt able to wiggle toes bil, PROM WFL no active quad or hamstring activation despite cueing and assist LLE Deficits / Details: pt able to wiggle toes bil, PROM WFL no active quad or hamstring activation despite cueing and assist     ADLs   Overall ADL's : Needs assistance/impaired Eating/Feeding: Supervision/ safety, Bed level Eating/Feeding Details (indicate cue type and reason): Supervision w/ cues to avoid rapid swallowing from straw. holding cup with L hand Grooming: Supervision/safety, Bed level, Wash/dry face, Minimal assistance, Applying deodorant Grooming Details (indicate cue type and reason): Washing face easily- often when cued to wash other body parts, pt automatically washing face again. Assist to lift LUE for RUE to don deodorant, able to don under RUE without issues Upper Body Bathing: Moderate assistance,  Bed level Upper Body Bathing Details (indicate cue type and reason): with cues, pt able to bathe underarms with assist to lift LUE. able to minimally wash chest/arms with cues/assist to manage the rest Lower Body Bathing: Moderate assistance, Bed level Lower Body Bathing Details (indicate cue type and reason): pt able to wash B upper LE and anterior peri region in bed with cues. Upper Body Dressing : Moderate assistance, Bed level Lower Body Dressing: Moderate assistance, Bed level Lower Body Dressing Details (indicate cue type and reason): With OT donning socks on B toes, pt able to reach, bring feet to self to pull up over heel Toileting- Clothing Manipulation and Hygiene: Total assistance, Bed level General ADL Comments: Emphasis on bed level ADL tasks in chair position.     Mobility   Overal bed mobility: Needs Assistance Bed Mobility: Rolling Rolling: Supervision Sidelying to sit: Mod assist, +2 for safety/equipment Supine to sit: Max assist Sit to supine: Total assist General bed mobility comments: pt in floor upon PT entry, assist back  to bed with nursing staff via lift; pt scooting up in bed using rails with mod cues and min A bed in trendelenberg     Transfers   Overall transfer level: Needs assistance Equipment used: 2 person hand held assist Transfers: Bed to chair/wheelchair/BSC Sit to Stand: Mod assist, +2 physical assistance Bed to/from chair/wheelchair/BSC transfer type:: Via Lift equipment Stand pivot transfers: Max assist, +2 physical assistance Transfer via Lift Equipment: Maximove General transfer comment: in floor upon PT entry, assisted to bed via lift; RN in to assess, replace foley bag and for hygiene due to fecal incontinence     Ambulation / Gait / Stairs / Wheelchair Mobility   Ambulation/Gait Ambulation/Gait assistance: Mod assist, Max assist, +2 physical assistance Gait Distance (Feet): 1 Feet Assistive device:  (hands on bedside table) Gait  Pattern/deviations: Decreased dorsiflexion - right, Decreased dorsiflexion - left, Step-to pattern, Shuffle, Trunk flexed General Gait Details: flexed with arms on bedside table. took 2 steps prior to needing to sit with mod cues for hip extension and max A for balance/safety, limited due to continued fecal incontinence     Posture / Balance Dynamic Sitting Balance Sitting balance - Comments: can sit for a few minutes with CGA to S then one episode pushing back needing mod to max A to recover Balance Overall balance assessment: Needs assistance Sitting-balance support: Feet supported Sitting balance-Leahy Scale: Poor Sitting balance - Comments: can sit for a few minutes with CGA to S then one episode pushing back needing mod to max A to recover Postural control: Posterior lean Standing balance support: Bilateral upper extremity supported Standing balance-Leahy Scale: Zero Standing balance comment: mod to max A standing with UE support     Special needs/care consideration Fall/Slip without injury 7/11 Indwelling catheter 7/4\bowel incontinence Pressure injury to sacrum note 6/21    Previous Home Environment Living Arrangements: Alone  Lives With: Alone Available Help at Discharge:  (at d/c to dtr's home, [place], with Mom, [name] and family 24/7) Type of Home:  (low income Apt in [place]) Home Layout: One level Home Access: Elevator Bathroom Shower/Tub: Engineer, manufacturing systems: Standard Bathroom Accessibility: Yes How Accessible: Accessible via walker Home Care Services: No Additional Comments: lives in 4th floor apartment. can go to daughter's house which is single story with 6 steps to enter   Discharge Living Setting Plans for Discharge Living Setting: Lives with (comment), House (dtr's home, [place]) Type of Home at Discharge: House Discharge Home Layout: One level Discharge Home Access: Stairs to enter Entrance Stairs-Rails: Can reach both Entrance Stairs-Number of  Steps: 6 Discharge Bathroom Shower/Tub: Tub/shower unit Discharge Bathroom Toilet: Standard Discharge Bathroom Accessibility: Yes How Accessible: Accessible via walker Does the patient have any problems obtaining your medications?: No   Social/Family/Support Systems Patient Roles: Parent Contact Information: Justice [name] and dtr, August Anticipated Caregiver: Mom , dtr and other family members Anticipated Caregiver's Contact Information: see contacts Ability/Limitations of Caregiver: Dtr works from home, MOm will come to her home daily to asisst, dtr checking on PCS services Caregiver Availability: 24/7 Discharge Plan Discussed with Primary Caregiver: Yes Is Caregiver In Agreement with Plan?: Yes Does Caregiver/Family have Issues with Lodging/Transportation while Pt is in Rehab?: No   Goals Patient/Family Goal for Rehab: min assist with PT, OT and SLP Expected length of stay: ELOS 2.5 to 3 weeks Additional Information: Mom and DTr aware that SNF will not be able to be obtained after CIR admit due to [facility]. ONly option is to go home from  CIR. I have had extensive discussions. Pt/Family Agrees to Admission and willing to participate: Yes Program Orientation Provided & Reviewed with Pt/Caregiver Including Roles  & Responsibilities: Yes Additional Information Needs: Dtr seems to be overwhelmed with Mom's condition   Decrease burden of Care through IP rehab admission: n/a   Possible need for SNF placement upon discharge:extensive discussions with Mom and dtr that SNF is not an option due to payor, [facility]    Patient Condition: This patient's condition remains as documented in the consult dated 08/28/23, in which the Rehabilitation Physician determined and documented that the patient's condition is appropriate for intensive rehabilitative care in an inpatient rehabilitation facility. Will admit to inpatient rehab today.   Preadmission Screen Completed By:  Alison Heron Lot, RN MSN  08/31/2023 12:28 PM, updates provided by Dontario Evetts, PT 09/01/23 ______________________________________________________________________   Discussed status with Dr. Babs on 09/01/23 at 9:00 am and received approval for admission today.   Admission Coordinator:  Alison Heron Lot, RN MSN, updates provided by Ardith Test, PT  time 9:15/Date7/14/25

## 2023-09-01 NOTE — Progress Notes (Signed)
 Physical Medicine and Rehabilitation Consult Reason for Consult:Acute rehab Referring Physician: Dr Elsie Montclair     HPI: Sarah Davis is a 63 y.o. R handed  female  with PMHx of: NSTEMI; bipolar d/o; CKD3a; COPD; hx of CVA, neuropathy; new dx of hypothyroidism during this hospitalization admitted to [facility] 07/28/23 with metabolic encephalopathy due to hypoglycemia- given D10 gtt. However once she woke up, was determined to have NSTEMI and transferred to [facility]. She underwent a cardiac Cath 07/31/23 and found to have chronic LAD occlusion. However she developed Acute resp failure and needed intubation emergently at the end of cardiac cath. She also developed worsening of AMS. She underwent EEG- was negative, MRI was (-) for anything acute; she stayed sedated and finally found an ammonia level of 250 on 6/16.  - started lactulose  and carnitine.  6/17- ammonia dropped into 70's with treatment.  However by 6/21, her LFTs started to increase due to carnitine; by 6/25- speaking oin phrases and 6/27 transferred out of ICU and still on TFs via Cortrak. 7/1- Cotrak removed due ot advance of diet.  7/2- her WBC up to 18k; found to have UTI- started on Rocephin . But had recurrent urinary retention, so foley, which had been removed, was put back in.  7/6- her ABX was changed to Meropenem  due to ESBL Klebsiella.  Quesiton of NASH; Also had acute on chronic CHF treatment; but limited due to hypotension. she is now on D3 thin diet Her BMI is 19- she doesn't know when last had BM- mother doesn't either (chart shows yesterday AM)- but feels constipated.        Review of Systems  Unable to perform ROS: Mental acuity  Pt denies pain- but otherwise hard to get info from her.        Past Medical History:  Diagnosis Date   Anxiety     Bipolar 2 disorder (HCC)     Chronic hepatitis C virus genotype 1 infection (HCC)     Chronic pancreatitis (HCC)     CKD (chronic kidney disease) stage 4, GFR 15-29 ml/min  (HCC)     COPD (chronic obstructive pulmonary disease) (HCC)     Coronary artery disease 11/2019    s/p PTCI  LCx & RCA. Complicated by Vfib arrest   DM (diabetes mellitus), type 2 (HCC)     HFrEF (heart failure with reduced ejection fraction) (HCC) 08/01/2023    EF 45-50%   Hyperparathyroidism, secondary (HCC)     Hypothyroid     Lithium toxicity     Marijuana abuse     NSTEMI (non-ST elevated myocardial infarction) (HCC) 07/30/2023    med management   Peripheral autonomic neuropathy due to DM [facility])     STEMI (ST elevation myocardial infarction) (HCC) 2021   Stroke [facility])      right hemiparesis   Substance abuse (HCC)     Tardive dyskinesia     Tobacco abuse               Past Surgical History:  Procedure Laterality Date   CARDIAC CATHETERIZATION       CHOLECYSTECTOMY       LEFT HEART CATH AND CORONARY ANGIOGRAPHY N/A 07/31/2023    Patent LCx & RCA stents. Chronic occlussion mLAD with collaterals=med management             Family History  Problem Relation Age of Onset   Diabetes Father          Social History:  reports that she  has been smoking cigarettes. She started smoking about 29 years ago. She has a 14.8 pack-year smoking history. She has never used smokeless tobacco. She reports current drug use. Drugs: Benzodiazepines and Marijuana. She reports that she does not drink alcohol. Allergies:  Allergies      Allergies  Allergen Reactions   Lithium Anaphylaxis   Sulfa Antibiotics Rash   Codeine Rash            Medications Prior to Admission  Medication Sig Dispense Refill   aspirin  EC 81 MG tablet Take 81 mg by mouth in the morning.       atorvastatin  (LIPITOR) 80 MG tablet Take 80 mg by mouth at bedtime.       busPIRone  (BUSPAR ) 15 MG tablet Take 15 mg by mouth 2 (two) times daily.       furosemide  (LASIX ) 20 MG tablet Take 20 mg by mouth once a week. On Mondays       gabapentin  (NEURONTIN ) 300 MG capsule Take 300-600 mg by mouth at bedtime.         lamoTRIgine  (LAMICTAL ) 100 MG tablet Take 100 mg by mouth at bedtime.   0   LANTUS  SOLOSTAR 100 UNIT/ML Solostar Pen Inject 30 Units into the skin daily with lunch.       levothyroxine  (SYNTHROID ) 100 MCG tablet Take 100 mcg by mouth every morning.       nitroGLYCERIN  (NITROSTAT ) 0.4 MG SL tablet Place 0.4 mg under the tongue every 5 (five) minutes as needed for chest pain.       paliperidone (INVEGA) 3 MG 24 hr tablet Take 3 mg by mouth at bedtime.       traZODone  (DESYREL ) 100 MG tablet Take 400 mg by mouth at bedtime.       Vitamin D , Ergocalciferol , (DRISDOL) 1.25 MG (50000 UNIT) CAPS capsule Take 50,000 Units by mouth once a week. On Mondays              Home: Home Living Family/patient expects to be discharged to:: Private residence Living Arrangements: Alone Available Help at Discharge: Family, Available 24 hours/day Type of Home: Apartment Home Access: Elevator Home Layout: One level Bathroom Shower/Tub: Engineer, manufacturing systems: Standard Home Equipment: Agricultural consultant (2 wheels), Other (comment) (knee scooter) Additional Comments: lives in 4th floor apartment. can go to daughter's house which is single story with 6 steps to enter  Functional History: Prior Function Prior Level of Function : Needs assist Mobility Comments: walking with RW, limited distance, doesn't drive ADLs Comments: family and friends doing grocery shopping and cleaning, able to manage ADLs though difficult at times Functional Status:  Mobility: Bed Mobility Overal bed mobility: Needs Assistance Bed Mobility: Rolling, Sidelying to Sit Rolling: Min assist, Used rails, Mod assist Sidelying to sit: Mod assist, +2 for safety/equipment Supine to sit: Max assist Sit to supine: Max assist, +2 for physical assistance General bed mobility comments: rolling in bed for hygiene, pt able to roll with cues and time, though needs A for further positioning for hygiene efforts; side to sit A for legs and trunk;  to supine A for most aspects due to soiled again from BM after standing trials Transfers Overall transfer level: Needs assistance Equipment used: 2 person hand held assist Transfers: Sit to/from Stand Sit to Stand: Mod assist, +2 physical assistance Bed to/from chair/wheelchair/BSC transfer type:: Via Lift equipment Stand pivot transfers: Max assist, +2 physical assistance Transfer via Lift Equipment: Stedy General transfer comment: stood x 2 from EOB  with A for anterior weight shift and balance with pt reports fear of falling; used b/s table and pt's elbows on table second stand for improved support Ambulation/Gait Ambulation/Gait assistance: Mod assist, Max assist, +2 physical assistance Gait Distance (Feet): 1 Feet Assistive device:  (hands on bedside table) Gait Pattern/deviations: Decreased dorsiflexion - right, Decreased dorsiflexion - left, Step-to pattern, Shuffle, Trunk flexed General Gait Details: flexed with arms on bedside table. took 2 steps prior to needing to sit with mod cues for hip extension and max A for balance/safety, limited due to continued fecal incontinence   ADL: ADL Overall ADL's : Needs assistance/impaired Eating/Feeding: Supervision/ safety, Set up, Cueing for sequencing, Minimal assistance Eating/Feeding Details (indicate cue type and reason): occasional min assist to load utensil, cues to stay on tasks and for sequencing Grooming: Wash/dry hands, Wash/dry face, Supervision/safety, Sitting Grooming Details (indicate cue type and reason): cues to initiate Upper Body Bathing: Total assistance Lower Body Bathing: Total assistance Upper Body Dressing : Total assistance Lower Body Dressing: Total assistance Toileting- Clothing Manipulation and Hygiene: Total assistance, Bed level General ADL Comments: family state patient having difficulty feeding self. OT provided setup and  provided family education on assistance and cues to provide to increase patient  participation with self feeding.   Cognition: Cognition Orientation Level: Oriented to person, Oriented to place, Disoriented to time, Disoriented to situation Cognition Arousal: Alert Behavior During Therapy: Flat affect   Blood pressure 109/76, pulse 65, temperature 97.7 F (36.5 C), temperature source Oral, resp. rate 18, height 5' 5 (1.651 m), weight 52 kg, SpO2 98%. Physical Exam Vitals and nursing note reviewed. Exam conducted with a chaperone present.  Constitutional:      Comments: Pt appears undernourished and older than stated age; asleep initially; but woke- would answer questions in phrases, but still confused and very delayed responses, mother at bedside; on low air loss mattress, NAD  HENT:     Right Ear: External ear normal.     Left Ear: External ear normal.     Nose: Nose normal. No congestion.     Mouth/Throat:     Mouth: Mucous membranes are dry.     Pharynx: Oropharynx is clear. No oropharyngeal exudate.  Eyes:     General:        Right eye: No discharge.        Left eye: No discharge.  Cardiovascular:     Rate and Rhythm: Regular rhythm.     Heart sounds: Normal heart sounds. No murmur heard.    No gallop.  Pulmonary:     Effort: Pulmonary effort is normal. No respiratory distress.     Breath sounds: Normal breath sounds. No wheezing or rhonchi.  Abdominal:     General: There is no distension.     Palpations: Abdomen is soft.     Tenderness: There is no abdominal tenderness.     Comments: Hypoactive BS  Genitourinary:    Comments: Foley in place- medium amber urine- just emptied by NT Musculoskeletal:     Cervical back: Neck supple. No tenderness.     Comments:  Deltoids 2+/5; biceps/triceps 4-/5 and WE/grip 4+/5 B/L LE's- HF 2/5- not antigravity at all; KE- couldn't get pt to participate; confused with directions DF 4-/5 and PF 4/5 B/L  Skin:    General: Skin is warm and dry.     Comments: Dry crepey skin- IV R forearm- a lot of UE pruple  bruising Pressure ulcer- 1/2 dollar sized- draining greenish drainage on  dressing- had on foam dressing- appears either deep stage II or superficial stage III- mother reports was not even nickel sized a few days ago    Neurological:     Comments: Very delayed responses No speaking unless asked questions multiple times in different ways  Somewhat confused- thinks 1971 and then said 2023- just stared at me when asked month or day/date. Was not able to answer. Appears slightly better with yes/no questions Decreased to light touch from knees on down B/L- intact otherwise on Ue's and proximal LEs B/L  Psychiatric:     Comments: Extremely flat- no facial expressions; just stared at me most of interview       Lab Results Last 24 Hours       Results for orders placed or performed during the hospital encounter of 07/30/23 (from the past 24 hours)  Glucose, capillary     Status: Abnormal    Collection Time: 08/27/23  4:18 PM  Result Value Ref Range    Glucose-Capillary 187 (H) 70 - 99 mg/dL  Glucose, capillary     Status: Abnormal    Collection Time: 08/27/23  8:58 PM  Result Value Ref Range    Glucose-Capillary 132 (H) 70 - 99 mg/dL  CBC     Status: Abnormal    Collection Time: 08/28/23  5:04 AM  Result Value Ref Range    WBC 9.1 4.0 - 10.5 K/uL    RBC 3.87 3.87 - 5.11 MIL/uL    Hemoglobin 10.3 (L) 12.0 - 15.0 g/dL    HCT 66.4 (L) 63.9 - 46.0 %    MCV 86.6 80.0 - 100.0 fL    MCH 26.6 26.0 - 34.0 pg    MCHC 30.7 30.0 - 36.0 g/dL    RDW 80.9 (H) 88.4 - 15.5 %    Platelets 344 150 - 400 K/uL    nRBC 0.0 0.0 - 0.2 %  Glucose, capillary     Status: Abnormal    Collection Time: 08/28/23  8:08 AM  Result Value Ref Range    Glucose-Capillary 160 (H) 70 - 99 mg/dL  Glucose, capillary     Status: Abnormal    Collection Time: 08/28/23 11:13 AM  Result Value Ref Range    Glucose-Capillary 161 (H) 70 - 99 mg/dL      Imaging Results (Last 48 hours)  No results found.        Assessment/Plan: Diagnosis: Metabolic encephalopathy and ICU myopathy Does the need for close, 24 hr/day medical supervision in concert with the patient's rehab needs make it unreasonable for this patient to be served in a less intensive setting? Yes Co-Morbidities requiring supervision/potential complications: Metabolic encephalopathy, high ammonia; dysphagia, urinary retention- delayed responses, decreased interaction; NSTEMI; bipolar d/o; CKD3a; COPD; hx of CVA, neuropathy; new dx of hypothyroidism Due to bladder management, bowel management, safety, skin/wound care, disease management, medication administration, and pain management, does the patient require 24 hr/day rehab nursing? Yes Does the patient require coordinated care of a physician, rehab nurse, therapy disciplines of PT, OT and SLP to address physical and functional deficits in the context of the above medical diagnosis(es)? Yes Addressing deficits in the following areas: balance, endurance, locomotion, strength, transferring, bowel/bladder control, bathing, dressing, feeding, grooming, toileting, cognition, language, and swallowing Can the patient actively participate in an intensive therapy program of at least 3 hrs of therapy per day at least 5 days per week? Yes The potential for patient to make measurable gains while on inpatient rehab is good Anticipated  functional outcomes upon discharge from inpatient rehab are min assist  with PT, min assist with OT, min assist with SLP. Estimated rehab length of stay to reach the above functional goals is: 2.5 to 3 weeks Anticipated discharge destination: Home Overall Rehab/Functional Prognosis: good   RECOMMENDATIONS: This patient's condition is appropriate for continued rehabilitative care in the following setting: CIR most probably, however not quite ready yet- she needs ot be able to tolerate 3 hours/day of therapy Patient has agreed to participate in recommended program.  Potentially Note that insurance prior authorization may be required for reimbursement for recommended care.   Comment: 1. Patient feeling constipated- per treating with lactulose , should be having BM's 2-4x/day to ideally decrease ammonia levels- pt was significantly confused when I was there- her mother said a little more than normal- I suggest increasing her Lactulose , treatment for Hyperammonia.  2. Trazodone  can cause increased urinary retention- I suggest that be stopped and changed to something else- I rarely suggest Restoril, but might be a better choice in her case? Cannot use Remeron due to her Bipolar d/o 3. Suggest moving her Flomax  to at bedtime- and maybe increasing to 0.8 mg at bedtime?- this might help her urinate better when foley removed- also being in AM, makes hypotension worse. 4. Pt needs to be turned q2 hours- her Sacral pressure ulcer is getting worse upon examination and d/w mother- it's deeper and larger- 5.  I suggest calling [facility] for other treatments as well, due to greenish drainage from sacral pressure ulcer.  6. Based on pt's exam, she appears to have ICU myopathy, not just debility- she is much weaker in her proximal arms and legs than distally.  This is likely due to prolonged time in ICU.  7. My concern is that pt has some other dx causing her Sx's- because not just cognitive sx's, she also has urinary retention as well as proximal weakness- wondering if any chance has anoxic Brain injury? Doesn't have a reason to have urinary retention, - there might not be a unifying dx, but is possible.  I understand she has ICU myopathy and metabolic encephalopathy- however as I said above, no reason for retention. I think additional work up might be needed.      Megan Lovorn, MD 08/28/2023    I spent a total of 87   minutes on total care today- >50% coordination of care- due to  Review of expansive chart, documentation took 29 minutes itself- also spoke to pt's NT as well as  admissions coordinator- also made multiple medical suggestions for treatment.

## 2023-09-01 NOTE — Discharge Instructions (Addendum)
 Inpatient Rehab Discharge Instructions  Sarah Davis Discharge date and time:  09/23/23  Activities/Precautions/ Functional Status: Activity: activity as tolerated Diet: cardiac diet--soft foods Wound Care: Routine skin checks   Functional status:  ___ No restrictions     ___ Walk up steps independently _X__ 24/7 supervision/assistance   ___ Walk up steps with assistance ___ Intermittent supervision/assistance  ___ Bathe/dress independently ___ Walk with walker     _X__ Bathe/dress with assistance ___ Walk Independently    ___ Shower independently _X__ Walk with Contact assist.    ___ Shower with assistance _X__ No alcohol     ___ Return to work/school ________  Special Instructions: No driving, smoking or alcohol She has been doing well on current dose of Trazodone  and continue this till follow up appointment with [facility] for input on medication changes. .   My questions have been answered and I understand these instructions. I will adhere to these goals and the provided educational materials after my discharge from the hospital.  Patient/Caregiver Signature _______________________________ Date __________  Clinician Signature _______________________________________ Date __________  Please bring this form and your medication list with you to all your follow-up doctor's appointments.

## 2023-09-01 NOTE — Progress Notes (Signed)
 Arrived to CIR today. Discussed unit policies, scheduling, and alarm requirements. Provider aware of wound to sacrum area- new orders placed. Admission/Assessment complete.  Geni Armor, LPN

## 2023-09-01 NOTE — Discharge Summary (Signed)
 Physician Discharge Summary  Patient ID: Sarah Davis MRN: [id number] DOB/AGE: [birth year] 63 y.o.  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 No cardiomegaly. No acute fracture or destructive lesion. IMPRESSION: No acute cardiopulmonary abnormality. Electronically Signed   By: Rogelia Myers M.D.   On: 09/16/2023 16:46   CT RENAL STONE STUDY Result Date: 09/11/2023 CLINICAL DATA:  Neurogenic bladder dysfunction retention. History of stones. EXAM: CT ABDOMEN AND PELVIS WITHOUT CONTRAST TECHNIQUE: Multidetector CT imaging of the abdomen and pelvis was performed following the standard protocol without IV contrast. RADIATION DOSE REDUCTION: This exam was performed according to the departmental dose-optimization program which includes automated exposure control, adjustment of the mA and/or kV according to patient size and/or use of iterative reconstruction technique. COMPARISON:  Renal ultrasound 09/08/2023. Contrast enhanced CT 05/26/2014 FINDINGS: Lower chest: Coronary artery calcifications. Breathing motion artifact with mild atelectasis in the dependent lower lobes. Hepatobiliary: Motion artifact through the liver. Allowing for this, no evidence of focal liver abnormality. Cholecystectomy with stable biliary prominence. Pancreas: Motion artifact through  the pancreas, no obvious pancreatic inflammation. No ductal dilatation. Spleen: Normal in size without focal abnormality. Adrenals/Urinary Tract: Normal adrenal glands. Dilatation of the left renal pelvis may represent extrarenal pelvis configuration, there is no calyceal or ureteral distension. No renal or ureteral stones. Mild symmetric perinephric fat stranding. Nondistended urinary bladder which is thick walled. There is periventricular fat stranding. Stomach/Bowel: The stomach is distended with ingested material. No bowel obstruction. Normal appendix visualized. Moderate volume of stool throughout the colon. Colonic diverticulosis. No diverticulitis. Vascular/Lymphatic: Aortic and branch atherosclerosis. No aortic aneurysm. Scattered retroperitoneal lymph nodes are not enlarged by size criteria. Reproductive: Unremarkable. Other: No free air, free fluid, or intra-abdominal fluid collection. Musculoskeletal: There are no acute or suspicious osseous abnormalities. Mild soft tissue thickening overlies the coccyx. No discrete fluid collection. IMPRESSION: 1. No renal or ureteral stones. 2. Nondistended urinary bladder which is thick walled with perivesicular fat stranding. Findings may represent cystitis. 3. Colonic diverticulosis without diverticulitis. Aortic Atherosclerosis (ICD10-I70.0). Electronically Signed   By: Andrea Gasman M.D.   On: 09/11/2023 19:11   US  RENAL Result Date: 09/08/2023 CLINICAL DATA:  Urinary retention. EXAM: RENAL / URINARY TRACT ULTRASOUND COMPLETE COMPARISON:  None Available. FINDINGS: Right Kidney: Renal measurements: 10.5 cm x 4.5 cm x 4.2 cm = volume: 103.0 mL. Echogenicity within normal limits. No mass or hydronephrosis visualized. Left Kidney: Renal measurements: 10.1 cm x 4.7 cm x 4.8 cm = volume: 117.2 mL. Echogenicity within normal limits. No mass is visualized. There is mild left-sided hydronephrosis. Bladder: Appears normal for degree of bladder distention. The bilateral  ureteral jets are visualized. Other: None. IMPRESSION: Mild left-sided hydronephrosis. Electronically Signed   By: Suzen Dials M.D.   On: 09/08/2023 20:12   DG Shoulder Left Result Date: 09/02/2023 CLINICAL DATA:  [id number] Left arm weakness [id number] EXAM: LEFT SHOULDER - 2+ VIEW COMPARISON:  None Available. FINDINGS: No acute fracture or dislocation. There is no evidence of arthropathy or other focal bone abnormality. Calcified  carotid plaque on the left. IMPRESSION: 1. No acute fracture or dislocation. 2. Calcified carotid plaque on the left. If not previously evaluated, nonemergent carotid ultrasound versus CTA of the neck should be considered for further characterization. These results will be called to the ordering clinician or representative by the Radiologist Assistant and communication documented in the PACS or [employer]. Electronically Signed   By: Rogelia Myers M.D.   On: 09/02/2023 16:01   CT HEAD WO CONTRAST ( ) Result Date: 08/29/2023 CLINICAL DATA:  Initial evaluation for acute head trauma. EXAM: CT HEAD WITHOUT CONTRAST TECHNIQUE: Contiguous axial images were obtained from the base of the skull through the vertex without intravenous contrast. RADIATION DOSE REDUCTION: This exam was performed according to the departmental dose-optimization program which includes automated exposure control, adjustment of the mA and/or kV according to patient size and/or use of iterative reconstruction technique. COMPARISON:  Prior CT from 08/07/2023. FINDINGS: Brain: Cerebral volume within normal limits. Mild chronic microvascular ischemic disease for age. No acute intracranial hemorrhage. No acute large vessel territory infarct. No mass lesion or midline shift. No hydrocephalus or extra-axial fluid collection. Vascular: No abnormal hyperdense vessel. Scattered vascular calcifications noted within the carotid siphons. Skull: Scalp soft tissues demonstrate no acute finding. Calvarium intact.  Sinuses/Orbits: Punctate optic drusen bodies noted. Globes orbital soft tissues otherwise unremarkable. Paranasal sinuses are clear. No mastoid effusion. Other: None. IMPRESSION: 1. No acute intracranial abnormality. 2. Mild chronic microvascular ischemic disease for age. Electronically Signed   By: Morene Hoard M.D.   On: 08/29/2023 20:50    Labs:  Basic Metabolic Panel: Recent Labs  Lab 09/18/23 0522 09/19/23 1835 09/20/23 1112 09/22/23 0910  NA 139 137 137 139  K 3.9 4.2 3.7 4.1  CL 105 101 104 103  CO2 26 23 23 24   GLUCOSE 161* 162* 234* 177*  BUN 43* 35* 29* 32*  CREATININE 1.04* 1.52* 1.11* 1.15*  CALCIUM  9.7 9.8 9.0 9.6  MG  --  2.1  --   --   PHOS  --  4.9*  --   --     CBC: Recent Labs  Lab 09/19/23 2050 09/20/23 1112 09/22/23 0910  WBC 10.8* 9.1 10.6*  NEUTROABS 6.7 5.7 6.3  HGB 10.8* 10.5* 11.4*  HCT 34.2* 33.4* 36.7  MCV 89.8 90.3 90.6  PLT 265 260 291    CBG: Recent Labs  Lab 09/21/23 2139 09/22/23 0550 09/22/23 1202 09/22/23 1631 09/23/23 0821  GLUCAP 95 130* 141* 114* 215*  Family history.  Father with diabetes.  Denies any colon cancer or esophageal cancer or rectal cancer   Brief HPI:   Vivan Vanderveer is a 63 y.o. right-handed female with history significant for bipolar disorder maintained on Lamictal  as well as BuSpar , CKD stage III followed by [facility] nephrology, COPD tobacco use as well as marijuana, chronic pancreatitis history of CVA with right hemiparesis chronic hepatitis C virus genotype diastolic congestive heart failure, diabetes mellitus peripheral neuropathy and tardive dyskinesia.  Per chart review patient lives alone.  1 level apartment with elevator.  Use a rolling walker for mobility.  She does not drive.  Family and friends do her grocery shopping and housekeeping.  Presented 07/28/2023 to [facility] with metabolic encephalopathy due to hypoglycemia given D10 gtt.  However as patient improved from hypoglycemic event found  to have non-STEMI and transferred to [facility].  She underwent cardiac catheterization 07/31/2023 found to have chronic LAD.  Hospital course developed acute respiratory failure requiring intubation  emergently at the end of catheterization.  She was also noted to have worsening altered mental status.  EEG negative.  MRI negative for anything acute she stayed sedated and found to have ammonia level of 250 on 6/16 started on lactulose  and carnitine.  Her ammonia levels did improve into the 70s with treatment however by 6/21 her LFTs started to increase due to carnitine was transferred to the ICU.  A nasogastric tube was placed diet slowly advanced to regular consistency.  Developed leukocytosis 18,000 found to have UTI started on Rocephin  initially with her antibiotics changed to meropenem  due to ESBL Klebsiella and antibiotic regimen completed and remain on contact precautions.  Follow-up cardiology services again after non-STEMI as well as acute on chronic CHF treatment but limited due to hypotension.  She did sustain a fall 7/11 when she slipped in her room cranial CT scan negative.  She remained on Lovenox  for DVT prophylaxis.  Aspirin  ongoing after non-STEMI.  Therapy evaluations completed due to patient's decreased functional mobility was admitted for a comprehensive rehab program.   Hospital Course: Gizzelle Lacomb was admitted to rehab 09/01/2023 for inpatient therapies to consist of PT, ST and OT at least three hours five days a week. Past admission physiatrist, therapy team and rehab RN have worked together to provide customized collaborative inpatient rehab.  Pertaining to patient's metabolic encephalopathy/ICU myopathy.  She continued to improve cranial CT scan MRI negative.  Maintained on Lovenox  for DVT prophylaxis.  Hospital course complicated by non-STEMI cardiac catheterization found to have chronic LAD remain on aspirin  therapy she would follow-up with cardiology services.  Blood sugars overall  controlled hemoglobin A1c 4.8 insulin  therapy as directed.  Leukocytosis/UTI/ESBL antibiotic therapy completed and remained on contact precautions.    Elevated ammonia levels continue to improve maintain on Chronulac  and adjusted as needed with latest ammonia level of 20.  Hormone supplement ongoing for hypothyroidism.  Documented history of CKD stage III latest creatinine 0.95 followed at [facility] nephrology.  Chronic anemia stable no bleeding episodes.  Decreased nutritional stores are diet advanced to regular follow-up dietary services.  Mood stabilization with history of bipolar disorder continue on Lamictal  as well as BuSpar .  COPD tobacco marijuana use urine drug screen negative monitoring of oxygen saturations every shift continue inhalers as directed.   Rehab course: During patient's stay in rehab weekly team conferences were held to monitor patient's progress, set goals and discuss barriers to discharge. At admission, patient required  He/She  has had improvement in activity tolerance, balance, postural control as well as ability to compensate for deficits. He/She has had improvement in functional use RUE/LUE  and RLE/LLE as well as improvement in awareness.  Bed mobility independent.  Ambulates 200 feet to the gym without assistive device with cues for increasing trunk rotation.  Completes times twelve 6 inch steps with cues for step sequencing.  Completed bed mobility edge of bed with supervision.  She doffed all clothing with good initiation.  Completed sit to stand from edge of bed with supervision.  Contact-guard supervision level functional mobility to the shower.  She completed shower level bathing seated on T TB with close supervision.  SLP follow-up reevaluated patient's cognitive-linguistic's scoring within functional limits on all subtest despite moderate deficits in registration, calculations and judgment.  Patient with functional expressive receptive language this date was 100%  intelligible at the conversational level.  Full family teaching completed plan discharge to home       Disposition:    Diet: Regular  Special Instructions: No driving smoking alcohol or illicit drug use  Patient was advised to follow-up with PCP for ongoing medical management  Medications at discharge 1.  Tylenol  as needed 2.  Vitamin C  1000 mg p.o. daily 3.  Aspirin  81 mg p.o. daily 4.  BuSpar  15 mg p.o. twice daily 5.  Folic acid  1 mg p.o. daily 6.  Lamictal  7.  Synthroid  100 mcg every morning 8.  Multivitamin daily 9.  Nitroglycerin  as needed chest pain 10.  Yupelri  175 mcg nebulizer daily 11.  Thiamine  100 mg p.o. daily 12.  Trazodone  50 mg p.o. nightly 13.  Zinc  sulfate 220 mg p.o. nightly 14.  Lipitor 80 mg nightly 15.  Lasix  20 mg by mouth every Monday 16.  Neurontin  300-600 mg by mouth bedtime 17.  Lantus  insulin  30 units into the skin with lunch 18.  Invega 3 mg nightly 19 vitamin D  50,000 units every Monday   30-35 minutes were spent completing discharge summary and discharge planning  Discharge Instructions     AMB referral to sports medicine   Complete by: As directed    Left shoulder pain/evaluate for RTC pathology   Ambulatory referral to Urology   Complete by: As directed    Seen in hospital. See asap after discharge for UDS, neurogenic bladder workup. Discharge pending 09/23/23        Follow-up Information     Emeline Joesph BROCKS, DO Follow up.   Specialty: Physical Medicine and Rehabilitation Why: No formal follow up needed Contact information: [address] [place] [address] [phone]         Micaela Acie LABOR, MD Follow up.   Specialty: Specialist Why: Call in 1-2 days for post hospital follow up Contact information: [address] [place] [address] [phone]         Raylene Debby MATSU., MD Follow up.   Specialty: Cardiology Why: Call in 1-2 days for post hospital follow up Contact  information: [address] [place] [address] [phone]         [facility] Follow up.   Why: Call in 1-2 days for post hospital follow up Contact information: [address] [place]  [address] [phone]        Servando Leeroy Ground, NP Follow up on 10/06/2023.   Specialty: Adult Health Nurse Practitioner Why: appointment at 2 pm for post hospital follow up Contact information: [address], [address] [place] [address] [phone]                 Signed: Sharlet GORMAN Schmitz 09/23/2023, 11:33 AM

## 2023-09-01 NOTE — Plan of Care (Addendum)
 Wound Plan   Wounds present: Wound 08/09/23 0534 Pressure Injury Sacrum Mid Unstageable - Full thickness tissue loss in which the base of the injury is covered by slough (yellow, tan, gray, green or brown) and/or eschar (tan, brown or black) in the wound bed.   Interventions: Regular diet /thin liquids eating 50-75 % Ensure plus high protein 2x daily in between meals Multivitamin 1 tablet daily Air mattress Turn q 2 hours Prevalon boot WOC consult Skin care regimen  Medi honey topical daily Vitamin C  1000mg  daily Zinc  220mg  daily  Braden Score: 14  Sensory: 3  Moisture: 3  Activity: 1  Mobility: 2  Nutrition: 3  Friction: 2   Contributors: Dr. Swartz/ Dr. Emeline Share Anguilli ,PA/ Daphne Finders NP Eulalio Falls BSN, RN-BC, [facility]

## 2023-09-01 NOTE — Discharge Summary (Signed)
 Physician Discharge Summary  Sarah Davis FMW:[id number] DOB: 1960-05-30 DOA: 07/30/2023  PCP: Patient, No Pcp Per  Admit date: 07/30/2023 Discharge date: 09/01/2023  Admitted From: Home Disposition: CIR  Recommendations for Outpatient Follow-up:  Follow up with PCP in 1-2 weeks:  Discharge Condition: Stable CODE STATUS: Full Diet recommendation: Low-salt low-fat low-carb diet  Brief/Interim Summary: Sarah Davis was transferred from [facility] hospital to [facility] for cardiac catheterization.  62/F w history of bipolar, CKD, COPD, coronary artery disease, history of CVA, neuropathy and hypothyroidism, who was admitted to [facility] 07/28/23 with the working diagnosis of acute metabolic encephalopathy due to hypoglycemia.Treated with D10 drip.    She was found to have NSTEMI, prompting her transfer. Subsequently underwent cardiac catheterization 07/31/2023 noted to have chronic occlusion of mid LAD, medical management recommended. Post procedure she had worsening mentation, and  acute respiratory failure. Required intubation emergently. She transferred to the intensive care unit.  Summary of events as below:   6/12 - CT negative for LVO, MRI negative for acute findings, advanced chronic microvascular ischemia,  no seizures on EEG.  6/14 remains somnolent.  6/16 unresponsive, ammonia back in 250 range; started carnitine, increased lactulose  6/17 ammonia back in 70s, still minimally responsive, remaining off sedation 6/21 LFTs elevated 6/23 no improvement in mental status, MRI ok, carnitine stopped as rising LFTs, possible DILI 6/24 spoke 1-2 words 6/25 speaking in phrases 6/26 held brief conversation -6/27, transferred from ICU to [facility] service -6/28 slowly improving mentation, tolerating well tube feedings.  -6/29 mentation continues to improve, per her family at the bedside she is not yet back to her baseline.  -6/30 advanced diet, with improvement in swallow dysfunction.  -7/01 Cortrak removed.   -7/2: WBC upto 18K, foley removed, UA suggestive of infection> started ceftriaxone  - 7/4, recurrent retention, Foley replaced - 7/6, urine culture growing Klebsiella, mental status improving - 7/6 urine culture growing ESBL Klebsiella, ceftriaxone  changed to meropenem  - 7/8 Antibiotics complete - 7/11 Patient had slip/fall - CT head negative - no other complaints/exam without notable physical findings 7/13 asymptomatic hypotension, fluid challenge improved SBP to 100  At this time patient continues to improve, transfer to inpatient rehab for ongoing physical therapy and treatment.  Ultimate disposition Home with family once patient recovers to acceptable level.  Continue current medications as listed below, otherwise patient stable and she and family are agreeable for discharge to inpatient rehab now that she has insurance approval.  Discharge Diagnoses:  Principal Problem:   Acute metabolic encephalopathy Active Problems:   Coronary artery disease   CKD stage 3a, GFR 45-59 ml/min (HCC)   Malnutrition of moderate degree   Hypothyroidism   Chronic anemia   Type 2 diabetes mellitus (HCC)  Acute metabolic encephalopathy - Multifactorial, likely related to hyperammonemia, severe hypoglycemia on original admission.  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  - Discussed case with psych, current medications likely not planning a role in her hyperammonemia, resumed per their recommendations  - Lamictal  25 mg daily -increase every 2 weeks as tolerated/for symptom management  - Trazodone  50 mg *low threshold to discontinue if mental status changes* - Cortrak removed, tolerating dysphagia diet - Discharge to inpatient rehab as above   Leukocytosis Catheter related UTI, resolved Urinary retention - Catheter originally placed for retention on 6/29, noted to have some dysuria, foul smelling urine and worsening  leukocytosis 7/2, UA abnormal - Initially treated with IV ceftriaxone ,  urine culture with 100,000 colonies of ESBL Klebsiella, transitioned to Meropenem  -completed 08/26/23 -Recurrent episode of retention -> Foley replaced 7/3, routine catheter care - Voiding trial failed, follow-up outpatient, would be reasonable to repeat voiding trial at inpatient rehab, if she continues to fail would recommend outpatient urology follow-up   Acute on chronic systolic and diastolic CHF - Echocardiogram - EF 45 to 50%, mild LVH, RV systolic function preserved, small pericardial effusion, mild aortic stenosis; LV hypokinetic apical septal segment, apical anterior segment, apical inferior segment, and apex.  - Now appears euvolemic(off diuretics), GDMT limited by hypotension - Monitor off diuretics for now, add PRN at DC   Ambulatory dysfunction, acute on chronic, POA  - Continue to work with PT OT as able - 2+ assist/lift required recently - Fall evening 7/11 -CT head negative, physical exam otherwise negative for pain, no indication for further radiation exposure/imaging at this time   Coronary artery disease NSTEMI with chronic occlusion of LAD.  Continue aspirin .,  Resume statin    CKD stage 3a, GFR 45-59 ml/min (HCC) AKI, hypernatremia,, hypokalemia.  - AKI has resolved   Malnutrition of moderate degree Continue nutritional supplements.    Hypothyroidism Continue levothyroxine     Chronic anemia Stable, monitor   Type 2 diabetes mellitus (HCC) - Continue home regimen Lab Results  Component Value Date   HGBA1C 6.0 (H) 08/19/2023       Discharge Instructions  Discharge Instructions     Amb Referral to Cardiac Rehabilitation   Complete by: As directed    Referral placed per protocol.   Diagnosis: NSTEMI   After initial evaluation and assessments completed: Virtual Based Care may be provided alone or in conjunction with Phase 2 Cardiac Rehab based on patient barriers.: Yes   Intensive  Cardiac Rehabilitation (ICR) [facility] location only OR Traditional Cardiac Rehabilitation (TCR) *If criteria for ICR are not met will enroll in TCR ([facility] only): Yes      Allergies as of 09/01/2023       Reactions   Lithium Anaphylaxis   Sulfa Antibiotics Rash   Codeine Rash        Medication List     ASK your doctor about these medications    aspirin  EC 81 MG tablet Take 81 mg by mouth in the morning.   atorvastatin  80 MG tablet Commonly known as: LIPITOR Take 80 mg by mouth at bedtime.   busPIRone  15 MG tablet Commonly known as: BUSPAR  Take 15 mg by mouth 2 (two) times daily.   furosemide  20 MG tablet Commonly known as: LASIX  Take 20 mg by mouth once a week. On Mondays   gabapentin  300 MG capsule Commonly known as: NEURONTIN  Take 300-600 mg by mouth at bedtime.   lamoTRIgine  100 MG tablet Commonly known as: LAMICTAL  Take 100 mg by mouth at bedtime.   Lantus  SoloStar 100 UNIT/ML Solostar Pen Generic drug: insulin  glargine Inject 30 Units into the skin daily with lunch.   levothyroxine  100 MCG tablet Commonly known as: SYNTHROID  Take 100 mcg by mouth every morning.   nitroGLYCERIN  0.4 MG SL tablet Commonly known as: NITROSTAT  Place 0.4 mg under the tongue every 5 (five) minutes as needed for chest pain.   paliperidone 3 MG 24 hr tablet Commonly known as: INVEGA Take 3 mg by mouth at bedtime.   traZODone  100 MG tablet Commonly known as: DESYREL  Take 400 mg by mouth at bedtime.   Vitamin D  (Ergocalciferol ) 1.25 MG (50000 UNIT) Caps capsule Commonly known as: DRISDOL Take  50,000 Units by mouth once a week. On Mondays        Allergies  Allergen Reactions   Lithium Anaphylaxis   Sulfa Antibiotics Rash   Codeine Rash    Consultations: Psychiatry, PCCM, cardiology   Procedures/Studies: CT HEAD WO CONTRAST ( ) Result Date: 08/29/2023 CLINICAL DATA:  Initial evaluation for acute head trauma. EXAM: CT HEAD WITHOUT CONTRAST TECHNIQUE: Contiguous axial  images were obtained from the base of the skull through the vertex without intravenous contrast. RADIATION DOSE REDUCTION: This exam was performed according to the departmental dose-optimization program which includes automated exposure control, adjustment of the mA and/or kV according to patient size and/or use of iterative reconstruction technique. COMPARISON:  Prior CT from 08/07/2023. FINDINGS: Brain: Cerebral volume within normal limits. Mild chronic microvascular ischemic disease for age. No acute intracranial hemorrhage. No acute large vessel territory infarct. No mass lesion or midline shift. No hydrocephalus or extra-axial fluid collection. Vascular: No abnormal hyperdense vessel. Scattered vascular calcifications noted within the carotid siphons. Skull: Scalp soft tissues demonstrate no acute finding. Calvarium intact. Sinuses/Orbits: Punctate optic drusen bodies noted. Globes orbital soft tissues otherwise unremarkable. Paranasal sinuses are clear. No mastoid effusion. Other: None. IMPRESSION: 1. No acute intracranial abnormality. 2. Mild chronic microvascular ischemic disease for age. Electronically Signed   By: Morene Hoard M.D.   On: 08/29/2023 20:50   DG CHEST PORT 1 VIEW Result Date: 08/20/2023 CLINICAL DATA:  Hypoxia EXAM: PORTABLE CHEST 1 VIEW COMPARISON:  08/07/2023 FINDINGS: Cardiopericardial silhouette is at upper limits of normal for size. Interstitial markings are diffusely coarsened with chronic features. No focal consolidation, edema, or pleural effusion. No acute bony abnormality. IMPRESSION: Chronic interstitial coarsening without acute cardiopulmonary findings. Electronically Signed   By: Camellia Candle M.D.   On: 08/20/2023 07:40   DG Swallowing Func-Speech Pathology Result Date: 08/18/2023 Table formatting from the original result was not included. Modified Barium Swallow Study Patient Details Name: Sarah Davis MRN: [id number] Date of Birth: [birth year] Today's Date: 08/18/2023  HPI/PMH: HPI: 63 year old female admitted from [facility] for cardiac cath 07/31/2023 without acute interventions required. MS changes post-procedure, somnolence and resp failure requiring emergent intubation 6/13-20. Dx acute metabolic encephalopathy, hyperammonemia, AKI, CAD with acute NSTEMI. Has cortrak. PMHx stroke 2021, polysubstance abuse, tobacco abuse, bipolar, thyroid disease. Clinical Impression: Clinical Impression: Pt presents with a mild oral dysphagia complicated primarily by her cognitive status, lack of initiation and encouragement required for full participation. Oral phase marked by slowed manipulation but adequate clearance of POs from oral cavity. There was intermittent high penetration of thin liquids into the laryngeal vestibule but NO aspiration over multiple trials.  There was adequate pharyngeal squeeze and clearance of materials through the pharynx. Pt declined the barium pill as well as regular solids.  Recommend initiating a dysphagia 2 diet with thin liquids; give meds whole in puree. SLP will follow. DIGEST Swallow Severity Rating*  Safety: 0  Efficiency:0  Overall Pharyngeal Swallow Severity: 0- WFL; dysphagia is primarily oral, mild 1: mild; 2: moderate; 3: severe; 4: profound *The Dynamic Imaging Grade of Swallowing Toxicity is standardized for the head and neck cancer population, however, demonstrates promising clinical applications across populations to standardize the clinical rating of pharyngeal swallow safety and severity. Factors that may increase risk of adverse event in presence of aspiration Noe & Lianne 2021): Factors that may increase risk of adverse event in presence of aspiration Noe & Lianne 2021): Limited mobility; Reduced cognitive function Recommendations/Plan: Swallowing Evaluation Recommendations Swallowing Evaluation Recommendations Recommendations: PO  diet PO Diet Recommendation: Dysphagia 2 (Finely chopped); Thin liquids (Level 0) Liquid  Administration via: Cup; Straw Medication Administration: Whole meds with puree Supervision: Staff to assist with self-feeding Oral care recommendations: Oral care BID (2x/day) Treatment Plan Treatment Plan Treatment recommendations: Therapy as outlined in treatment plan below Functional status assessment: Patient has had a recent decline in their functional status and demonstrates the ability to make significant improvements in function in a reasonable and predictable amount of time. Treatment frequency: Min 2x/week Treatment duration: 1 week Interventions: Trials of upgraded texture/liquids; Patient/family education; Diet toleration management by SLP Recommendations Recommendations for follow up therapy are one component of a multi-disciplinary discharge planning process, led by the attending physician.  Recommendations may be updated based on patient status, additional functional criteria and insurance authorization. Assessment: Orofacial Exam: Orofacial Exam Oral Cavity - Dentition: Edentulous Anatomy: Anatomy: Presence of cervical hardware Boluses Administered: Boluses Administered Boluses Administered: Thin liquids (Level 0); Mildly thick liquids (Level 2, nectar thick); Puree; Solid  Oral Impairment Domain: Oral Impairment Domain Lip Closure: No labial escape Tongue control during bolus hold: Cohesive bolus between tongue to palatal seal Bolus preparation/mastication: Slow prolonged chewing/mashing with complete recollection Bolus transport/lingual motion: Slow tongue motion Oral residue: Complete oral clearance Initiation of pharyngeal swallow : Pyriform sinuses  Pharyngeal Impairment Domain: Pharyngeal Impairment Domain Soft palate elevation: No bolus between soft palate (SP)/pharyngeal wall (PW) Laryngeal elevation: Complete superior movement of thyroid cartilage with complete approximation of arytenoids to epiglottic petiole Anterior hyoid excursion: Complete anterior movement Epiglottic movement:  Complete inversion Laryngeal vestibule closure: Incomplete, narrow column air/contrast in laryngeal vestibule Pharyngeal stripping wave : Present - complete Pharyngeal contraction (A/P view only): N/A Pharyngoesophageal segment opening: Partial distention/partial duration, partial obstruction of flow Tongue base retraction: Trace column of contrast or air between tongue base and PPW Pharyngeal residue: Trace residue within or on pharyngeal structures Location of pharyngeal residue: Valleculae; Pyriform sinuses  Esophageal Impairment Domain: No data recorded Pill: Pill Consistency administered: -- (NT - pt declined) Penetration/Aspiration Scale Score: Penetration/Aspiration Scale Score 1.  Material does not enter airway: Mildly thick liquids (Level 2, nectar thick); Puree; Solid 2.  Material enters airway, remains ABOVE vocal cords then ejected out: Thin liquids (Level 0) Compensatory Strategies: Compensatory Strategies Compensatory strategies: Yes Straw: Effective   General Information: Caregiver present: No  Diet Prior to this Study: NPO; Cortrak/Small bore NG tube   Temperature : Normal   Respiratory Status: WFL   Supplemental O2: None (Room air)   History of Recent Intubation: Yes  Behavior/Cognition: Requires cueing Self-Feeding Abilities: Dependent for feeding Baseline vocal quality/speech: Normal Volitional Cough: Able to elicit Volitional Swallow: Able to elicit Exam Limitations: Excessive movement; Poor participation Goal Planning: Prognosis for improved oropharyngeal function: Good Barriers to Reach Goals: Cognitive deficits No data recorded No data recorded Consulted and agree with results and recommendations: Pt unable/family or caregiver not available Pain: Pain Assessment Pain Assessment: Faces Faces Pain Scale: 0 End of Session: Start Time:SLP Start Time (ACUTE ONLY): 1311 Stop Time: SLP Stop Time (ACUTE ONLY): 1335 Time Calculation:SLP Time Calculation (min) (ACUTE ONLY): 24 min Charges: SLP  Evaluations $ SLP Speech Visit: 1 Visit SLP Evaluations $MBS Swallow: 1 Procedure SLP visit diagnosis: SLP Visit Diagnosis: Dysphagia, oropharyngeal phase (R13.12) Past Medical History: Past Medical History: Diagnosis Date  Anxiety   Bipolar 2 disorder (HCC)   Chronic hepatitis C virus genotype 1 infection (HCC)   Chronic pancreatitis (HCC)   CKD (chronic kidney disease) stage 4, GFR 15-29  ml/min (HCC)   COPD (chronic obstructive pulmonary disease) (HCC)   Coronary artery disease 11/2019  s/p PTCI  LCx & RCA. Complicated by Vfib arrest  DM (diabetes mellitus), type 2 (HCC)   HFrEF (heart failure with reduced ejection fraction) (HCC) 08/01/2023  EF 45-50%  Hyperparathyroidism, secondary (HCC)   Hypothyroid   Lithium toxicity   Marijuana abuse   NSTEMI (non-ST elevated myocardial infarction) (HCC) 07/30/2023  med management  Peripheral autonomic neuropathy due to DM [facility])   STEMI (ST elevation myocardial infarction) (HCC) 2021  Stroke [facility])   right hemiparesis  Substance abuse (HCC)   Tardive dyskinesia   Tobacco abuse  Past Surgical History: Past Surgical History: Procedure Laterality Date  CARDIAC CATHETERIZATION    CHOLECYSTECTOMY    LEFT HEART CATH AND CORONARY ANGIOGRAPHY N/A 07/31/2023  Patent LCx & RCA stents. Chronic occlussion mLAD with collaterals=med management Vona Palma Laurice 08/18/2023, 2:10 PM Amanda L. Vona, MA CCC/SLP Clinical Specialist - Acute Care SLP Acute Rehabilitation Services Office number [phone]  MR BRAIN W WO CONTRAST Result Date: 08/11/2023 CLINICAL DATA:  Nystagmus.  Altered mental status. EXAM: MRI HEAD WITHOUT AND WITH CONTRAST TECHNIQUE: Multiplanar, multiecho pulse sequences of the brain and surrounding structures were obtained without and with intravenous contrast. CONTRAST:  6mL GADAVIST  GADOBUTROL  1 MMOL/ML IV SOLN COMPARISON:  Head CT 08/07/2023 and MRI 08/02/2023 FINDINGS: The study is intermittently up to moderately motion degraded. Brain: There is no evidence  of an acute infarct, mass, midline shift, or extra-axial fluid collection. There may be a single chronic microhemorrhage in the left temporoparietal region. Scattered small T2 hyperintensities in the cerebral white matter bilaterally are unchanged and nonspecific but compatible with mild-to-moderate chronic small vessel ischemic disease. Small chronic cortical infarcts are again noted in the posterior left frontal and left parietal lobes, and there is a chronic lacunar infarct in the left lentiform nucleus. No abnormal enhancement is identified. There is mild cerebral atrophy. The brainstem is normal in signal. Vascular: Major intracranial vascular flow voids are preserved. Skull and upper cervical spine: Unremarkable bone marrow signal. Sinuses/Orbits: Unremarkable orbits. Clear paranasal sinuses. Minimal bilateral mastoid fluid. Other: None. IMPRESSION: 1. No acute intracranial abnormality. 2. Chronic ischemia with old infarcts as above. Electronically Signed   By: Dasie Hamburg M.D.   On: 08/11/2023 14:41   Overnight EEG with video Result Date: 08/11/2023 Shelton Arlin KIDD, MD     08/11/2023 10:45 PM Patient Name: Sarah Davis MRN: [id number] Epilepsy Attending: Arlin KIDD Shelton Referring Physician/Provider: Khaliqdina, Salman, MD Duration: 08/10/2023 1112 to 08/11/2023 9044  Patient history: 62yo F with ams. EEG to evaluate for seizure.  Level of alertness: awake, asleep  AEDs during EEG study: None  Technical aspects: This EEG study was done with scalp electrodes positioned according to the 10-20 International system of electrode placement. Electrical activity was reviewed with band pass filter of 1-70Hz , sensitivity of 7 uV/mm, display speed of 35mm/sec with a 60Hz  notched filter applied as appropriate. EEG data were recorded continuously and digitally stored.  Video monitoring was available and reviewed as appropriate. Description: The posterior dominant rhythm consists of 8-9 Hz activity of moderate voltage  (25-35 uV) seen predominantly in posterior head regions, symmetric and reactive to eye opening and eye closing.  Sleep was characterized by vertex waves, sleep spindles (12 to 14 Hz), maximal frontocentral region. EEG showed  intermittent generalized 3 to 6 Hz theta-delta slowing. Hyperventilation and photic stimulation were not performed.  Of note, parts of study were technically difficult  due to significant myogenic artifact. ABNORMALITY - Intermittent slow, generalized IMPRESSION: This technically difficult study is suggestive of mild diffuse encephalopathy. No seizures or epileptiform discharges were seen throughout the recording. Priyanka O Yadav   VAS US  UPPER EXTREMITY VENOUS DUPLEX Result Date: 08/07/2023 UPPER VENOUS STUDY  Patient Name:  Sarah Davis  Date of Exam:   08/07/2023 Medical Rec #: [id number]  Accession #:    [id number] Date of Birth: Oct 27, 1960  Patient Gender: F Patient Age:   11 years Exam Location:  [facility] Procedure:      VAS US  UPPER EXTREMITY VENOUS DUPLEX Referring Phys: LEITA GASKINS --------------------------------------------------------------------------------  Indications: Edema Limitations: Poor ultrasound/tissue interface, edema, line, bandages, and open wound. Comparison Study: No prior study Performing Technologist: Alberta Lis RVS  Examination Guidelines: A complete evaluation includes B-mode imaging, spectral Doppler, color Doppler, and power Doppler as needed of all accessible portions of each vessel. Bilateral testing is considered an integral part of a complete examination. Limited examinations for reoccurring indications may be performed as noted.  Right Findings: +----------+------------+---------+-----------+----------+-----------------+ RIGHT     CompressiblePhasicitySpontaneousProperties     Summary      +----------+------------+---------+-----------+----------+-----------------+ IJV           Full       Yes       Yes                                 +----------+------------+---------+-----------+----------+-----------------+ Subclavian               Yes       Yes                                +----------+------------+---------+-----------+----------+-----------------+ Axillary      Full       Yes       Yes                                +----------+------------+---------+-----------+----------+-----------------+ Brachial      Full       Yes       Yes                                +----------+------------+---------+-----------+----------+-----------------+ Radial                   Yes       Yes                                +----------+------------+---------+-----------+----------+-----------------+ Ulnar                    Yes       Yes                                +----------+------------+---------+-----------+----------+-----------------+ Cephalic                                             Not visualized   +----------+------------+---------+-----------+----------+-----------------+ Basilic       None  Age Indeterminate +----------+------------+---------+-----------+----------+-----------------+  Left Findings: +----------+------------+---------+-----------+----------+-----------------+ LEFT      CompressiblePhasicitySpontaneousProperties     Summary      +----------+------------+---------+-----------+----------+-----------------+ IJV           Full       Yes       Yes                                +----------+------------+---------+-----------+----------+-----------------+ Subclavian    Full       Yes       Yes                                +----------+------------+---------+-----------+----------+-----------------+ Axillary      Full       Yes       Yes                                +----------+------------+---------+-----------+----------+-----------------+ Brachial      Full       Yes       Yes                                 +----------+------------+---------+-----------+----------+-----------------+ Radial        Full       Yes       Yes                                +----------+------------+---------+-----------+----------+-----------------+ Ulnar                    Yes       Yes                                +----------+------------+---------+-----------+----------+-----------------+ Cephalic                                             Not visualized   +----------+------------+---------+-----------+----------+-----------------+ Basilic       None       No        No               Age Indeterminate +----------+------------+---------+-----------+----------+-----------------+  Summary:  Right: No evidence of deep vein thrombosis in the upper extremity. Findings consistent with age indeterminate superficial vein thrombosis involving the right basilic vein.  Left: No evidence of deep vein thrombosis in the upper extremity. Findings consistent with age indeterminate superficial vein thrombosis involving the left basilic vein.  *See table(s) above for measurements and observations.  Diagnosing physician: Debby Robertson Electronically signed by Debby Robertson on 08/07/2023 at 5:44:21 PM.    Final    DG CHEST PORT 1 VIEW Result Date: 08/07/2023 CLINICAL DATA:  Leukocytosis. EXAM: PORTABLE CHEST 1 VIEW COMPARISON:  AP radiograph chest dated August 01 2023. FINDINGS: The heart is normal in size. Pulmonary vasculature is unremarkable. There are hazy and reticular opacities in the left lung base. The right lung appears clear. An endotracheal tube and feeding tube remain in place. IMPRESSION: 1. Hazy, reticular left basilar opacification/consolidation. Electronically Signed  By: Evalene Coho M.D.   On: 08/07/2023 10:47   CT HEAD WO CONTRAST ( ) Result Date: 08/07/2023 CLINICAL DATA:  Encephalopathy. EXAM: CT HEAD WITHOUT CONTRAST TECHNIQUE: Contiguous axial images were obtained from the base of the skull  through the vertex without intravenous contrast. RADIATION DOSE REDUCTION: This exam was performed according to the departmental dose-optimization program which includes automated exposure control, adjustment of the mA and/or kV according to patient size and/or use of iterative reconstruction technique. COMPARISON:  CT of the head dated 04/03/2023. FINDINGS: Brain: Mild focal encephalomalacia changes within the left frontal and parietal lobes related to chronic cortical infarcts. Chronic lacunar infarct in left basal ganglia. Mild subcortical white matter disease. No evidence of hemorrhage, mass, acute cortical infarct or hydrocephalus. Vascular: Atheromatous calcifications within the carotid siphons. Skull: Intact and normally mineralized.  No lesions are present. Sinuses/Orbits: Negative. Other: None. IMPRESSION: 1. Chronic focal encephalomalacia changes within the left frontal and parietal lobes and chronic ischemic small vessel disease. No apparent acute process. Electronically Signed   By: Evalene Coho M.D.   On: 08/07/2023 10:46   EEG adult Result Date: 08/04/2023 Shelton Arlin KIDD, MD     08/04/2023 10:10 AM Patient Name: Sarah Davis MRN: [id number] Epilepsy Attending: Arlin KIDD Shelton Referring Physician/Provider: Gretta Leita SQUIBB, DO Date: 08/04/2023 Duration: 22.25 mins Patient history: 63yo F with ams. EEG to evaluate for seizure. Level of alertness: comatose/ lethargic AEDs during EEG study: None during eeg ( was on propofol  before EEG) Technical aspects: This EEG study was done with scalp electrodes positioned according to the 10-20 International system of electrode placement. Electrical activity was reviewed with band pass filter of 1-70Hz , sensitivity of 7 uV/mm, display speed of 88mm/sec with a 60Hz  notched filter applied as appropriate. EEG data were recorded continuously and digitally stored.  Video monitoring was available and reviewed as appropriate. Description: EEG showed continuous  generalized low amplitude 2-3Hz  delta slowing. Hyperventilation and photic stimulation were not performed.   ABNORMALITY - Continuous slow, generalized IMPRESSION: This study is suggestive of severe to profound diffuse encephalopathy. No seizures or epileptiform discharges were seen throughout the recording. Priyanka KIDD Shelton     Subjective: No acute issues or events overnight denies nausea vomit diarrhea constipation fever chills or chest pain   Discharge Exam: Vitals:   09/01/23 0834 09/01/23 1215  BP: 103/69 (!) 109/54  Pulse: 83 74  Resp: 18 18  Temp: 97.7 F (36.5 C) (!) 97.4 F (36.3 C)  SpO2: 100%    Vitals:   09/01/23 0357 09/01/23 0744 09/01/23 0834 09/01/23 1215  BP: 106/60  103/69 (!) 109/54  Pulse: 70  83 74  Resp: 16  18 18   Temp: 97.8 F (36.6 C)  97.7 F (36.5 C) (!) 97.4 F (36.3 C)  TempSrc: Oral   Oral  SpO2: 97% 100% 100%   Weight:      Height:        General: Pt is alert, awake, not in acute distress Cardiovascular: RRR, S1/S2 +, no rubs, no gallops Respiratory: CTA bilaterally, no wheezing, no rhonchi Abdominal: Soft, NT, ND, bowel sounds + Extremities: no edema, no cyanosis    The results of significant diagnostics from this hospitalization (including imaging, microbiology, ancillary and laboratory) are listed below for reference.     Microbiology: No results found for this or any previous visit (from the past 240 hours).   Labs: BNP (last 3 results) No results for input(s): BNP in the last 8760 hours. Basic Metabolic  Panel: Recent Labs  Lab 08/26/23 0419 08/27/23 1441  NA 138 136  K 4.1 4.0  CL 103 96*  CO2 27 27  GLUCOSE 124* 221*  BUN 27* 23  CREATININE 0.82 0.95  CALCIUM  9.2 9.4   Liver Function Tests: No results for input(s): AST, ALT, ALKPHOS, BILITOT, PROT, ALBUMIN in the last 168 hours. No results for input(s): LIPASE, AMYLASE in the last 168 hours. Recent Labs  Lab 08/27/23 0808  AMMONIA 20    CBC: Recent Labs  Lab 08/26/23 0419 08/27/23 0236 08/28/23 0504  WBC 9.5 10.8* 9.1  HGB 9.8* 10.2* 10.3*  HCT 31.9* 33.9* 33.5*  MCV 87.6 87.8 86.6  PLT 403* 330 344   Cardiac Enzymes: No results for input(s): CKTOTAL, CKMB, CKMBINDEX, TROPONINI in the last 168 hours. BNP: Invalid input(s): POCBNP CBG: Recent Labs  Lab 08/31/23 0811 08/31/23 1155 08/31/23 1627 09/01/23 0833 09/01/23 1210  GLUCAP 171* 269* 140* 148* 172*   D-Dimer No results for input(s): DDIMER in the last 72 hours. Hgb A1c No results for input(s): HGBA1C in the last 72 hours. Lipid Profile No results for input(s): CHOL, HDL, LDLCALC, TRIG, CHOLHDL, LDLDIRECT in the last 72 hours. Thyroid function studies No results for input(s): TSH, T4TOTAL, T3FREE, THYROIDAB in the last 72 hours.  Invalid input(s): FREET3 Anemia work up No results for input(s): VITAMINB12, FOLATE, FERRITIN, TIBC, IRON, RETICCTPCT in the last 72 hours. Urinalysis    Component Value Date/Time   COLORURINE YELLOW 08/20/2023 2200   APPEARANCEUR HAZY (A) 08/20/2023 2200   LABSPEC 1.005 08/20/2023 2200   PHURINE 7.0 08/20/2023 2200   GLUCOSEU NEGATIVE 08/20/2023 2200   HGBUR NEGATIVE 08/20/2023 2200   BILIRUBINUR NEGATIVE 08/20/2023 2200   KETONESUR NEGATIVE 08/20/2023 2200   PROTEINUR NEGATIVE 08/20/2023 2200   NITRITE POSITIVE (A) 08/20/2023 2200   LEUKOCYTESUR MODERATE (A) 08/20/2023 2200   Sepsis Labs Recent Labs  Lab 08/26/23 0419 08/27/23 0236 08/28/23 0504  WBC 9.5 10.8* 9.1   Microbiology No results found for this or any previous visit (from the past 240 hours).   Time coordinating discharge: Over 30 minutes  SIGNED:   Elsie JAYSON [place], DO [facility] 09/01/2023, 12:55 PM Pager   If 7PM-7AM, please contact night-coverage [email or web address]

## 2023-09-01 NOTE — Progress Notes (Signed)
 Inpatient Rehabilitation Admission Medication Review by a Pharmacist  A complete drug regimen review was completed for this patient to identify any potential clinically significant medication issues.  High Risk Drug Classes Is patient taking? Indication by Medication  Antipsychotic No   Anticoagulant Yes Lovenox  - VTE prophylaxis  Antibiotic No   Opioid No   Antiplatelet Yes Aspirin - h/o CVA  Hypoglycemics/insulin  Yes Insulin  aspart SSI, semglee  insulin - DM  Vasoactive Medication Yes Tamsulosin  (Flomax ) -BPH  Chemotherapy No   Other Yes Acetaminophen - pain Albuterol , arformoterol , revefenacin - COPD/tobacco use/marijuana use.  Buspirone , lamictal  - anxiety, bipolar disorder/ mood/behavior Ensure plus - nutritional supplement Lactulose  - Hyper ammonemia Levothyroxine -hypothyroid Thiamine , Folic acid , multi- vitamin, zinc , vitamin C  supplements Nitroglycerine SL- angina/ chest pain Trazodone  - sleep     Type of Medication Issue Identified Description of Issue Recommendation(s)  Drug Interaction(s) (clinically significant)     Duplicate Therapy     Allergy     No Medication Administration End Date     Incorrect Dose     Additional Drug Therapy Needed     Significant med changes from prior encounter (inform family/care partners about these prior to discharge). Atorvastatin , furosemide , ,paliperidone (Invega), gabapentin   not resumed yet. Restart PTA meds when and if necessary during CIR admission or at time of discharge, if warranted.   Communicate to patient /family/ caregiver prior to discharge.    Other       Clinically significant medication issues were identified that warrant physician communication and completion of prescribed/recommended actions by midnight of the next day:  No  Name of provider notified for urgent issues identified:   Provider Method of Notification:    Pharmacist comments:   Time spent performing this drug regimen review (minutes):      Levorn Gaskins, RPh Clinical Pharmacist 09/01/2023 3:33 PM

## 2023-09-01 NOTE — Progress Notes (Signed)
Foley care done.

## 2023-09-01 NOTE — Progress Notes (Signed)
 Physical Therapy Treatment Patient Details Name: Sarah Davis MRN: [id number] DOB: [birth year] Today's Date: 09/01/2023   History of Present Illness 63 yo female admitted 07/30/23. 6/12 cardiac cath with somnolence and respiratory failure post procedure requiring intubation 6/13-6/20. NSTEMI, encephalopathy and AKI impacting course. PMhx: polysubstance abuse, bipolar disorder, thyroid disease, hep C, COPD, DM, HFrEF, CVA with Rt hemiparesis    PT Comments  Pt presents with both cognitive and functional deficits. Pt requiring max tactile cues to initiate tasks however was able to amb 15' with bilat HHA this date. Pt with ataxic like movement and poor trunk control. Continue to recommend aggressive rehab program > 3 hrs a day to address above deficits and progress towards mod I level of function as pt was PTA. Acute PT to cont to follow.    If plan is discharge home, recommend the following: Two people to help with walking and/or transfers;Two people to help with bathing/dressing/bathroom;Supervision due to cognitive status;Assist for transportation;Help with stairs or ramp for entrance   Can travel by private vehicle     No  Equipment Recommendations  Hospital bed;Wheelchair cushion (measurements PT);Wheelchair (measurements PT)    Recommendations for Other Services Rehab consult     Precautions / Restrictions Precautions Precautions: Fall Recall of Precautions/Restrictions: Impaired Restrictions Weight Bearing Restrictions Per Provider Order: No     Mobility  Bed Mobility Overal bed mobility: Needs Assistance Bed Mobility: Rolling Rolling: Min assist Sidelying to sit: Mod assist       General bed mobility comments: pt requiring max tactile cues to initiate transfer, modA for trunk elevation to sit EOB    Transfers Overall transfer level: Needs assistance Equipment used: 2 person hand held assist Transfers: Sit to/from Stand Sit to Stand: Mod assist, +2 physical assistance            General transfer comment: modA x2 to initiate and power up, posterior bias    Ambulation/Gait Ambulation/Gait assistance: Mod assist, Max assist, +2 physical assistance Gait Distance (Feet): 15 Feet Assistive device: 2 person hand held assist Gait Pattern/deviations: Decreased dorsiflexion - right, Decreased dorsiflexion - left, Step-to pattern, Shuffle, Trunk flexed (bilat LEs in external rotation) Gait velocity: dec     General Gait Details: pt with posterior bias, pt able to clear bilat feet for first 7 feet then began shuffling due to onset of fatigue requiring maxA to maintain upright position   Stairs             Wheelchair Mobility     Tilt Bed    Modified Rankin (Stroke Patients Only)       Balance Overall balance assessment: Needs assistance Sitting-balance support: Feet supported Sitting balance-Leahy Scale: Poor Sitting balance - Comments: can sit for a few minutes with CGA to S then one episode pushing back needing mod to max A to recover Postural control: Posterior lean Standing balance support: Bilateral upper extremity supported Standing balance-Leahy Scale: Zero Standing balance comment: mod to max A standing with UE support                            Communication Communication Communication: No apparent difficulties Factors Affecting Communication: Difficulty expressing self (only answered full name and birthdate, otherwise non-verbal)  Cognition Arousal: Alert Behavior During Therapy: Flat affect   PT - Cognitive impairments: Attention, Initiation, Problem solving, Awareness, Safety/Judgement Difficult to assess due to: Impaired communication  PT - Cognition Comments: pt answering yes/no questions 50% of time appropriately. Pt following simple commands < 50% of time, requires tactile cues to initiate task Following commands: Impaired Following commands impaired: Follows one step commands  inconsistently    Cueing Cueing Techniques: Verbal cues, Visual cues, Tactile cues  Exercises      General Comments General comments (skin integrity, edema, etc.): vss      Pertinent Vitals/Pain Pain Assessment Pain Assessment: No/denies pain    Home Living                          Prior Function            PT Goals (current goals can now be found in the care plan section) Acute Rehab PT Goals PT Goal Formulation: Patient unable to participate in goal setting Time For Goal Achievement: 09/12/23 Potential to Achieve Goals: Fair Progress towards PT goals: Progressing toward goals    Frequency    Min 2X/week      PT Plan      Co-evaluation              AM-PAC PT 6 Clicks Mobility   Outcome Measure  Help needed turning from your back to your side while in a flat bed without using bedrails?: A Lot Help needed moving from lying on your back to sitting on the side of a flat bed without using bedrails?: Total Help needed moving to and from a bed to a chair (including a wheelchair)?: Total Help needed standing up from a chair using your arms (e.g., wheelchair or bedside chair)?: Total Help needed to walk in hospital room?: Total Help needed climbing 3-5 steps with a railing? : Total 6 Click Score: 7    End of Session Equipment Utilized During Treatment: Gait belt Activity Tolerance: Patient tolerated treatment well Patient left: with call bell/phone within reach;with nursing/sitter in room;in chair;with chair alarm set Nurse Communication: Mobility status;Need for lift equipment PT Visit Diagnosis: Other abnormalities of gait and mobility (R26.89);Difficulty in walking, not elsewhere classified (R26.2);Other symptoms and signs involving the nervous system (R29.898)     Time: 0810-0830 PT Time Calculation (min) (ACUTE ONLY): 20 min  Charges:    $Gait Training: 8-22 mins PT General Charges $$ ACUTE PT VISIT: 1 Visit                      Norene Ames, PT, DPT Acute Rehabilitation Services Secure chat preferred Office #: [phone]    Norene CHRISTELLA Ames 09/01/2023, 11:03 AM

## 2023-09-01 NOTE — Progress Notes (Addendum)
 Inpatient Rehab Admissions Coordinator:   Addendum: MD has cleared patient for discharge today. Will admit to CIR.   We received insurance approval for inpatient rehab and have bed available today. Await attending MD clearance for patient discharge.   Rehab Admissons Coordinator Evans Levee, [place], [place] [phone]

## 2023-09-02 ENCOUNTER — Inpatient Hospital Stay (HOSPITAL_COMMUNITY): Payer: MEDICAID

## 2023-09-02 DIAGNOSIS — G9341 Metabolic encephalopathy: Secondary | ICD-10-CM | POA: Diagnosis not present

## 2023-09-02 LAB — CBC WITH DIFFERENTIAL/PLATELET
Abs Immature Granulocytes: 0.02 K/uL (ref 0.00–0.07)
Basophils Absolute: 0.1 K/uL (ref 0.0–0.1)
Basophils Relative: 1 %
Eosinophils Absolute: 0.3 K/uL (ref 0.0–0.5)
Eosinophils Relative: 4 %
HCT: 31.3 % — ABNORMAL LOW (ref 36.0–46.0)
Hemoglobin: 9.8 g/dL — ABNORMAL LOW (ref 12.0–15.0)
Immature Granulocytes: 0 %
Lymphocytes Relative: 31 %
Lymphs Abs: 2.5 K/uL (ref 0.7–4.0)
MCH: 27.8 pg (ref 26.0–34.0)
MCHC: 31.3 g/dL (ref 30.0–36.0)
MCV: 88.7 fL (ref 80.0–100.0)
Monocytes Absolute: 0.6 K/uL (ref 0.1–1.0)
Monocytes Relative: 8 %
Neutro Abs: 4.4 K/uL (ref 1.7–7.7)
Neutrophils Relative %: 56 %
Platelets: 242 K/uL (ref 150–400)
RBC: 3.53 MIL/uL — ABNORMAL LOW (ref 3.87–5.11)
RDW: 18.7 % — ABNORMAL HIGH (ref 11.5–15.5)
WBC: 7.9 K/uL (ref 4.0–10.5)
nRBC: 0 % (ref 0.0–0.2)

## 2023-09-02 LAB — COMPREHENSIVE METABOLIC PANEL WITH GFR
ALT: 31 U/L (ref 0–44)
AST: 24 U/L (ref 15–41)
Albumin: 2.3 g/dL — ABNORMAL LOW (ref 3.5–5.0)
Alkaline Phosphatase: 112 U/L (ref 38–126)
Anion gap: 11 (ref 5–15)
BUN: 21 mg/dL (ref 8–23)
CO2: 25 mmol/L (ref 22–32)
Calcium: 9.1 mg/dL (ref 8.9–10.3)
Chloride: 102 mmol/L (ref 98–111)
Creatinine, Ser: 0.97 mg/dL (ref 0.44–1.00)
GFR, Estimated: >60 mL/min (ref >60)
Glucose, Bld: 138 mg/dL — ABNORMAL HIGH (ref 70–99)
Potassium: 4 mmol/L (ref 3.5–5.1)
Sodium: 138 mmol/L (ref 135–145)
Total Bilirubin: 0.5 mg/dL (ref 0.0–1.2)
Total Protein: 5.9 g/dL — ABNORMAL LOW (ref 6.5–8.1)

## 2023-09-02 LAB — GLUCOSE, CAPILLARY
Glucose-Capillary: 131 mg/dL — ABNORMAL HIGH (ref 70–99)
Glucose-Capillary: 185 mg/dL — ABNORMAL HIGH (ref 70–99)
Glucose-Capillary: 167 mg/dL — ABNORMAL HIGH (ref 70–99)
Glucose-Capillary: 88 mg/dL (ref 70–99)

## 2023-09-02 MED ORDER — POLYETHYLENE GLYCOL 3350 17 G PO PACK
17.0000 g | PACK | Freq: Every day | ORAL | Status: DC
  Administered 2023-09-02 – 2023-09-09 (×8): 17 g via ORAL
  Filled 2023-09-02 (×8): qty 1

## 2023-09-02 MED ORDER — JUVEN PO PACK
1.0000 | PACK | Freq: Two times a day (BID) | ORAL | Status: DC
  Administered 2023-09-03 – 2023-09-22 (×27): 1 via ORAL
  Filled 2023-09-02 (×31): qty 1

## 2023-09-02 MED ORDER — SENNOSIDES-DOCUSATE SODIUM 8.6-50 MG PO TABS
1.0000 | ORAL_TABLET | Freq: Two times a day (BID) | ORAL | Status: DC
  Administered 2023-09-02 – 2023-09-09 (×15): 1 via ORAL
  Filled 2023-09-02 (×15): qty 1

## 2023-09-02 NOTE — Patient Care Conference (Cosign Needed)
 Inpatient RehabilitationTeam Conference and Plan of Care Update Date: 09/02/2023   Time: 1013 am    Patient Name: Sarah Davis      Medical Record Number: [id number]  Date of Birth: 1961-02-18 Sex: Female         Room/Bed: 4W13C/4W13C-01 Payor Info: Payor: [employer] / Plan: [employer] PLAN / Product Type: *No Product type* /    Admit Date/Time:  09/01/2023 12:49 PM  Primary Diagnosis:  Acute metabolic encephalopathy  Hospital Problems: Principal Problem:   Acute metabolic encephalopathy    Expected Discharge Date: Expected Discharge Date:  (pending)  Team Members Present: Physician leading conference: Dr. Joesph Likes Social Worker Present: Graeme Jude, LCSW Nurse Present: Eulalio Falls, RN PT Present: Kirt Dawn, PT OT Present: Nena Moats, OT SLP Present: Recardo Mole, SLP     Current Status/Progress Goal Weekly Team Focus  Bowel/Bladder   Patient has a foley cath and is incontinent of stool   Patient will be able to call out when needing to have a BM   Assess pt's need to have BM PRN and ensure daily CHG's to remain free from infection    Swallow/Nutrition/ Hydration   reg/thin           ADL's   Max A ADLs d/t profound cognitive deficits- apraxia, motor planning, ataxic   min A overall   Cognitive retraining, ADLs, transfers, d/c planning    Mobility   eval pending           Communication   eval pending            Safety/Cognition/ Behavioral Observations  evla pending            Pain   Pain denies pain   Patient remains free from pain   Assess pain QS and PRN and admin PRNs if necessary    Skin   Patient has sacral wound   Patient's sacral wound will heal and wounds will not form anywhere else  Q2 turns, perform wound care as ordered. Continue air mattress      Discharge Planning:  TBA. Per EMR, pt will d/c to home with her mother and daughter assisting. PCS services will be explored. SW will confirm there are  no barriers to discharge.    Team Discussion: Patient was admitted post debility due to metabolic encephalopathy/ ICU myopathy. Patient limited by impulsivity, profound cognitive deficits ,severe motor planning , apraxia and poor initiation.  Patient on target to meet rehab goals: Evals pending  *See Care Plan and progress notes for long and short-term goals.   Revisions to Treatment Plan:  Telesitter  Full supervision with meals  Psych consult WOC consult Air mattress Turn q 2 hours Dietician consult Voiding trials Wound care plan initiated  Teaching Needs: Safety, medications, toileting, transfers, etc.   Current Barriers to Discharge: Decreased caregiver support, Home enviroment access/layout, and Behavior  Possible Resolutions to Barriers: Family Education     Medical Summary Current Status: medically complicate by encephalopathy, diabetes, CHF, COPD, CKD stage III, anemia, protein-calorie malutrition, urinary retention, sacral wound, and anxiety  Barriers to Discharge: Behavior/Mood;Cardiac Complications;Complicated Wound;Incontinence;Medical stability;Renal Insufficiency/Failure;Self-care education;Uncontrolled Diabetes   Possible Resolutions to [employer]: monitor labs and vitals, titrate diabetes regimen, DC foley trial, wound care consultation for sacral wound, monitor volume and respiratory status   Continued Need for Acute Rehabilitation Level of Care: The patient requires daily medical management by a physician with specialized training in physical medicine and rehabilitation for the following reasons:  Direction of a multidisciplinary physical rehabilitation program to maximize functional independence : Yes Medical management of patient stability for increased activity during participation in an intensive rehabilitation regime.: Yes Analysis of laboratory values and/or radiology reports with any subsequent need for medication adjustment and/or medical  intervention. : Yes   I attest that I was present, lead the team conference, and concur with the assessment and plan of the team.   Ashani Pumphrey Gayo 09/02/2023, 1013 am

## 2023-09-02 NOTE — Plan of Care (Signed)
  Problem: Consults Goal: RH GENERAL PATIENT EDUCATION Description: See Patient Education module for education specifics. Outcome: Progressing   Problem: RH BOWEL ELIMINATION Goal: RH STG MANAGE BOWEL WITH ASSISTANCE Description: STG Manage Bowel with minimal Assistance. Outcome: Progressing   Problem: RH BLADDER ELIMINATION Goal: RH STG MANAGE BLADDER WITH ASSISTANCE Description: STG Manage Bladder With minimal Assistance Outcome: Progressing   Problem: RH SKIN INTEGRITY Goal: RH STG SKIN FREE OF INFECTION/BREAKDOWN Description: Manage skin free of infection/breakdown with minimal assistance Outcome: Progressing   Problem: RH SAFETY Goal: RH STG ADHERE TO SAFETY PRECAUTIONS W/ASSISTANCE/DEVICE Description: STG Adhere to Safety Precautions With minimal Assistance/Device. Outcome: Progressing   Problem: RH PAIN MANAGEMENT Goal: RH STG PAIN MANAGED AT OR BELOW PT'S PAIN GOAL Description: < 4 w/prns Outcome: Progressing   Problem: RH KNOWLEDGE DEFICIT GENERAL Goal: RH STG INCREASE KNOWLEDGE OF SELF CARE AFTER HOSPITALIZATION Description: Manage increase knowledge of self care after hospitalization with minimal assistance from family using educational materials provided Outcome: Progressing   Problem: Consults Goal: RH GENERAL PATIENT EDUCATION Description: See Patient Education module for education specifics. Outcome: Progressing   Problem: RH BOWEL ELIMINATION Goal: RH STG MANAGE BOWEL WITH ASSISTANCE Description: STG Manage Bowel with minimal Assistance. Outcome: Progressing   Problem: RH SKIN INTEGRITY Goal: RH STG SKIN FREE OF INFECTION/BREAKDOWN Description: Manage skin free of infection/breakdown with minimal assistance Outcome: Progressing   Problem: RH PAIN MANAGEMENT Goal: RH STG PAIN MANAGED AT OR BELOW PT'S PAIN GOAL Description: < 4 w/prns Outcome: Progressing   Problem: RH SAFETY Goal: RH STG ADHERE TO SAFETY PRECAUTIONS  W/ASSISTANCE/DEVICE Description: STG Adhere to Safety Precautions With minimal Assistance/Device. Outcome: Progressing

## 2023-09-02 NOTE — Progress Notes (Signed)
 Foley removed per MD order patient tolerated well.   Geni Armor, LPN

## 2023-09-02 NOTE — Progress Notes (Signed)
 PROGRESS NOTE   Subjective/Complaints:  No events overnight. Vitals stable     09/02/2023    8:48 AM 09/02/2023    7:00 AM 09/02/2023    6:09 AM  Vitals with BMI  Weight  115 lbs 5 oz   BMI  22.52   Systolic   105  Diastolic   60  Pulse 82  82    Recent Labs    09/01/23 1708 09/01/23 2140 09/02/23 0651  GLUCAP 112* 187* 131*     P.o. intakes 30-75% Has foley  ROS: Denies fevers, chills, N/V, abdominal pain, constipation, diarrhea, SOB, cough, chest pain, new weakness or paraesthesias.    Objective:   No results found. Recent Labs    09/01/23 1401 09/02/23 0518  WBC 8.0 7.9  HGB 10.0* 9.8*  HCT 31.6* 31.3*  PLT 235 242   Recent Labs    09/01/23 1401 09/02/23 0518  NA  --  138  K  --  4.0  CL  --  102  CO2  --  25  GLUCOSE  --  138*  BUN  --  21  CREATININE 0.94 0.97  CALCIUM   --  9.1    Intake/Output Summary (Last 24 hours) at 09/02/2023 0924 Last data filed at 09/02/2023 0800 Gross per 24 hour  Intake 596 ml  Output 3350 ml  Net -2754 ml        Physical Exam: Vital Signs Blood pressure 105/60, pulse 82, temperature 97.7 F (36.5 C), temperature source Oral, resp. rate 18, height 5' (1.524 m), weight 52.3 kg, SpO2 99%.  Constitutional: No apparent distress. Appropriate appearance for age.  HENT: No JVD. Neck Supple. Trachea midline. Atraumatic, normocephalic. Eyes: PERRLA. EOMI. Visual fields grossly intact.  Cardiovascular: RRR, no murmurs/rub/gallops. No Edema. Peripheral pulses 2+  Respiratory: CTAB. No rales, rhonchi, or wheezing. On RA.  Abdomen: + bowel sounds, normoactive. No distention or tenderness.  GU: Not examined. +Foley, draining clear urine.  Skin: C/D/I. No apparent lesions. MSK:      No apparent deformity. LUE shoulder ROM <50 degrees abduction, flexion.    Neurologic exam:  Cognition: AAO to person, place, time and event.  Language: Fluent, Mild dysarthria.   Memory: mild deficits Insight: Fair insight into current condition.  Mood: Flat affect; denies SI/HI Sensation: Equal and intact in BL UE and Les.  Reflexes: 2+ in BL UE and LEs. Negative Hoffman's and babinski signs bilaterally.  CN: 2-12 grossly intact.  Coordination: BL UE intention tremor.  Spasticity: MAS 0 in all extremities.   Strength: 4/5 BL UE, 2/5 proximal and 4/5 distal BL LE     Assessment/Plan: 1. Functional deficits which require 3+ hours per day of interdisciplinary therapy in a comprehensive inpatient rehab setting. Physiatrist is providing close team supervision and 24 hour management of active medical problems listed below. Physiatrist and rehab team continue to assess barriers to discharge/monitor patient progress toward functional and medical goals  Care Tool:  Bathing              Bathing assist       Upper Body Dressing/Undressing Upper body dressing        Upper body assist  Lower Body Dressing/Undressing Lower body dressing            Lower body assist       Toileting Toileting    Toileting assist Assist for toileting: Dependent - Patient 0%     Transfers Chair/bed transfer  Transfers assist  Chair/bed transfer activity did not occur: Safety/medical concerns        Locomotion Ambulation   Ambulation assist              Walk 10 feet activity   Assist           Walk 50 feet activity   Assist           Walk 150 feet activity   Assist           Walk 10 feet on uneven surface  activity   Assist           Wheelchair     Assist               Wheelchair 50 feet with 2 turns activity    Assist            Wheelchair 150 feet activity     Assist          Blood pressure 105/60, pulse 82, temperature 97.7 F (36.5 C), temperature source Oral, resp. rate 18, height 5' (1.524 m), weight 52.3 kg, SpO2 99%.  Medical Problem List and Plan: 1. Functional  deficits secondary to metabolic encephalopathy/ICU myopathy             -patient may  shower             -ELOS/Goals: 14-17 days, min assist goals   - Stable to continue IRF  7/15: Profound cognitive deficits. Severe motor planning and apraxia, impulsive ex. Tried to sit mid-air. Max A overall but can power up with Min A. Will need 24/7 Min A on discharge.   2.  Antithrombotics: -DVT/anticoagulation:  Pharmaceutical: Lovenox              -antiplatelet therapy: Aspirin  81 mg daily  3. Pain Management: Tylenol  as needed  4. Mood/Behavior/Sleep/bipolar disorder II - poorly controlled: BuSpar  15 mg twice daily, Lamictal  titrating to 100 mg daily             -antipsychotic agents: N/A   - 7/15: Very flat affect and poor initiation per nursing; will need full SPV for meals per nursing. Continue current medication  titration - no mania, SI, HI  - Per psych consult inpatient: Will plan to slowly titrate lamictal  since she has not been taking it for at least 1 month and to prevent risk of side effects such as SJS. Will also plan to start trazodone  at a lower dose compared to her home dose of 400 mg in setting of preventing additional sedation, altered mental status, and since she had been sleeping well while in the hospital. We will defer to outpatient psychiatry for titrating trazodone  if indicated. She can follow-up with her outpatient psychiatric provider at [facility] upon discharge.  - ?resumption on invega 3 mg; will defer to OP   5. Neuropsych/cognition: This patient is not capable of making decisions on her own behalf. 6. Skin/Wound Care: Routine skin checks   - 7/15: Unstageable sacral PI; awaiting WC consult. Safety zone report completed.   Cleanse with Vashe [id number], not rinse, pat dry the peri-wound skin. Apply Xeroform on the wound bed daily. Cover with sacrum foam dressing changing every 3  days or PRN soiling.   7. Fluids/Electrolytes/Nutrition: Routine in and outs with follow-up  chemistries   - 7/15: Albumin 2.3; monitor PO intakes, Ensure BID. See #15 below.   8.  CAD/Non-STEMI/acute on chronic diastolic congestive heart failure.  Cardiac catheterization found to have chronic LAD.  Continue aspirin  therapy   - daily weights  Filed Weights   09/02/23 0700  Weight: 52.3 kg    9.  Diabetes mellitus with peripheral neuropathy.  Hemoglobin A1c 4.8.  NovoLog  5 units 3 times daily, Semglee  12 units daily             -sugars under fair control at present--continue current regimen Recent Labs    09/01/23 1708 09/01/23 2140 09/02/23 0651  GLUCAP 112* 187* 131*     10.  Leukocytosis/UTI.  Antibiotic therapy completed 11.  Hyperammonemia.  Continue Chronulac  10 mg twice daily.  Follow-up ammonia levels             -last ammonia level 20 on 7/9  12.  Hypothyroidism.  Synthroid  13.  Documented CKD stage III.  Latest creatinine 0.95.  Follow-up chemistries.  Follow-up with [facility] nephrology 14.  Chronic anemia.  Follow-up CBC 15.  Decreased nutritional storage.  Diet advanced to regular.  Follow-up dietary services             -intake poor/inconsistent             -engage RD for assistance   16.  COPD/tobacco use/marijuana use.  Continue inhalers as directed.  Check oxygen saturations every shift.  Urine drug screen negative.  Provide counseling   17. Urinary retention. Foley replaced 7/4. Awaiting WC consult as above for recs prior to removal--see above, DC foley trial started 7/15.   18. Constipation. Small, hard BM this AM. Add Miralax  daily, sennakot s 1 tab BID.   19. L drop arm. ?RTC. Patient endorses fall 2 months ago with no workup. Will xray.   LOS: 1 days A FACE TO FACE EVALUATION WAS PERFORMED  Joesph JAYSON Likes 09/02/2023, 9:24 AM

## 2023-09-02 NOTE — Consult Note (Addendum)
 WOC Nurse Consult Note: Reason for Consult: requested to assess a pressure injury on sacrum. Performed remotely after assessing photos and notes. Wound type: PI unstageable on sacrum. The first assessment was on 06/21 at the previous admission.  Pressure Injury POA: Yes Measurement: 2.5 cm x 2 cm (see flowsheet) Wound bed: 100% yellow slough. Drainage (amount, consistency, odor)  Periwound: intact, redness, maceration between 6 to 9 o'clock position. Dressing procedure/placement/frequency: Cleanse with Vashe [id number], not rinse, pat dry the peri-wound skin. Apply Xeroform on the wound bed daily. Cover with sacrum foam dressing changing every 3 days or PRN soiling.  Bed nurse recommendations: - Turn and repositioning per hospital policy  - If the pt is able to go in the chair, add a chair pressure retribution pad.   WOC team will follow weekly. Please reconsult if further assistance is needed. Thank-you,  Lela Holm BSN, CNS, RN, [employer], WOCN  (Phone [phone])

## 2023-09-02 NOTE — Evaluation (Signed)
 Occupational Therapy Assessment and Plan  Patient Details  Name: Sarah Davis MRN: [id number] Date of Birth: 30-Sep-1960  OT Diagnosis: apraxia, ataxia, cognitive deficits, and muscle weakness (generalized) Rehab Potential: Rehab Potential (ACUTE ONLY): Fair ELOS: 21-25 days   Today's Date: 09/02/2023 OT Individual Time: 851 - 1000 69 minutes        Hospital Problem: Principal Problem:   Acute metabolic encephalopathy   Past Medical History:  Past Medical History:  Diagnosis Date   Anxiety    Bipolar 2 disorder (HCC)    Chronic hepatitis C virus genotype 1 infection (HCC)    Chronic pancreatitis (HCC)    CKD (chronic kidney disease) stage 4, GFR 15-29 ml/min (HCC)    COPD (chronic obstructive pulmonary disease) (HCC)    Coronary artery disease 11/2019   s/p PTCI  LCx & RCA. Complicated by Vfib arrest   DM (diabetes mellitus), type 2 (HCC)    HFrEF (heart failure with reduced ejection fraction) (HCC) 08/01/2023   EF 45-50%   Hyperparathyroidism, secondary (HCC)    Hypothyroid    Lithium toxicity    Marijuana abuse    NSTEMI (non-ST elevated myocardial infarction) (HCC) 07/30/2023   med management   Peripheral autonomic neuropathy due to DM [facility])    STEMI (ST elevation myocardial infarction) (HCC) 2021   Stroke [facility])    right hemiparesis   Substance abuse (HCC)    Tardive dyskinesia    Tobacco abuse    Past Surgical History:  Past Surgical History:  Procedure Laterality Date   CARDIAC CATHETERIZATION     CHOLECYSTECTOMY     LEFT HEART CATH AND CORONARY ANGIOGRAPHY N/A 07/31/2023   Patent LCx & RCA stents. Chronic occlussion mLAD with collaterals=med management    Assessment & Plan Clinical Impression: Kiran Lapine is a 63 year old right-handed female with past medical history of bipolar disorder on lamictal  and Buspar , CKD stage III, COPD tobacco use as well as marijuana, chronic pancreatitis, history of CVA with right hemiparesis, chronic hepatitis C virus genotype,  diastolic congestive heart failure, diabetes mellitus with peripheral neuropathy, tardive dyskinesia. Per chart review patient lives alone. 1 level apartment with elevator. Uses a rolling walker for mobility. She does not drive. Family and friends do her grocery shopping and housecleaning. Presented 07/28/2023 to [facility] with metabolic encephalopathy due to hypoglycemia given D10 gtt. However as patient improved from hypoglycemic event found to have non-STEMI and transferred to [facility]. She underwent cardiac catheterization 07/31/2023 found to have chronic LAD. Hospital course patient developed acute respiratory failure required intubation emergently at the end of catheterization. She was also noted to have worsening altered mental status. EEG was negative. MRI negative for anything acute she stayed sedated and found to have ammonia level of 250 on 6/16 and started on lactulose  and carnitine. Her ammonia levels did improve into the 70s with treatment however by 6/21 her LFTs started to increase due to carnitine and was transferred to the ICU. A Cortrak was placed and diet slowly advanced to a regular consistency. Developed leukocytosis 18,000 found to have UTI started on Rocephin  initially with and her antibiotics were changed to meropenem  due to ESBL Klebsiella and antibiotic regimen has been completed and remains on contact precautions. Follow-up cardiology services again after non-STEMI as well as acute on chronic CHF treatment but limited due to hypotension. She did sustain a fall on 7/11 when she slipped in her room cranial CT scan repeated that again was negative. She remains on Lovenox  for DVT prophylaxis.  Aspirin  ongoing after non-STEMI. Therapy evaluations completed due to patient's debility was admitted for a comprehensive rehab program.  Patient transferred to CIR on 09/01/2023 .    Patient currently requires max with basic self-care skills secondary to muscle weakness, decreased  cardiorespiratoy endurance, decreased motor planning and ideational apraxia, decreased initiation, decreased attention, decreased awareness, decreased problem solving, decreased safety awareness, decreased memory, and delayed processing, and decreased sitting balance, decreased standing balance, decreased postural control, and decreased balance strategies.  Prior to hospitalization, patient could complete ADLs with modified independent .  Patient will benefit from skilled intervention to increase independence with basic self-care skills prior to discharge home with care partner.  Anticipate patient will require 24 hour supervision and minimal physical assistance and follow up home health.  OT - End of Session Activity Tolerance: Tolerates 10 - 20 min activity with multiple rests Endurance Deficit: Yes OT Assessment Rehab Potential (ACUTE ONLY): Fair OT Barriers to Discharge: Decreased caregiver support;Home environment access/layout OT Barriers to Discharge Comments: Stairs to enter per chart and unclear 24/7 support OT Patient demonstrates impairments in the following area(s): Balance;Perception;Behavior;Cognition;Safety;Sensory;Endurance;Skin Integrity;Motor OT Basic ADL's Functional Problem(s): Eating;Grooming;Bathing;Dressing;Toileting OT Transfers Functional Problem(s): Toilet;Tub/Shower OT Additional Impairment(s): None OT Plan OT Intensity: Minimum of 1-2 x/day, 45 to 90 minutes OT Frequency: 5 out of 7 days OT Duration/Estimated Length of Stay: 21-25 days OT Treatment/Interventions: Balance/vestibular training;Discharge planning;Pain management;Self Care/advanced ADL retraining;Therapeutic Activities;UE/LE Coordination activities;Therapeutic Exercise;Skin care/wound managment;Patient/family education;Functional mobility training;Disease mangement/prevention;Cognitive remediation/compensation;DME/adaptive equipment instruction;Community reintegration;Psychosocial support;UE/LE Strength  taining/ROM;Wheelchair propulsion/positioning OT Self Feeding Anticipated Outcome(s): (S) OT Basic Self-Care Anticipated Outcome(s): min A OT Toileting Anticipated Outcome(s): min A OT Bathroom Transfers Anticipated Outcome(s): min A OT Recommendation Recommendations for Other Services: Speech consult;Neuropsych consult Patient destination: Home Follow Up Recommendations: Home health OT Equipment Recommended: To be determined   OT Evaluation Precautions/Restrictions  Restrictions Weight Bearing Restrictions Per Provider Order: No General   Vital Signs Therapy Vitals Pulse Rate: 82 Resp: 18 Patient Position (if appropriate): Lying Oxygen Therapy O2 Device: Room Air Pain Pain Assessment Pain Scale: 0-10 Pain Score: 0-No pain Home Living/Prior Functioning Home Living Family/patient expects to be discharged to:: Private residence Living Arrangements: Children, Other relatives Available Help at Discharge: Other (Comment) (per [facility] pt will dc to daughters house) Type of Home: Apartment Home Access: Elevator Home Layout: One level Bathroom Shower/Tub: Engineer, manufacturing systems: Standard Bathroom Accessibility: Yes Additional Comments: lives in 4th floor apartment. can go to daughter's house which is single story with 6 steps to enter- per EMR. Pt unreliable historian  Lives With: Alone IADL History Homemaking Responsibilities:  (unclear- will confirm with family) Prior Function Level of Independence: Requires assistive device for independence, Independent with basic ADLs, Independent with gait (need to confirm with family) Vision Baseline Vision/History: 0 No visual deficits Ability to See in Adequate Light: 0 Adequate Patient Visual Report: No change from baseline Vision Assessment?: No apparent visual deficits Perception  Perception: Within Functional Limits Praxis Praxis: Impaired Praxis Impairment Details: Initiation;Ideation;Ideomotor;Motor  planning Cognition Cognition Overall Cognitive Status: Impaired/Different from baseline Arousal/Alertness: Awake/alert Orientation Level: Person;Place;Situation Person: Oriented Place: Oriented Situation: Disoriented Memory: Impaired Memory Impairment: Storage deficit;Retrieval deficit;Decreased short term memory Decreased Short Term Memory: Verbal basic Attention: Sustained Sustained Attention: Impaired Sustained Attention Impairment: Verbal basic;Functional basic Awareness: Impaired Awareness Impairment: Intellectual impairment Problem Solving: Impaired Problem Solving Impairment: Verbal basic;Functional basic Executive Function: Initiating;Self Monitoring;Self Correcting Initiating: Impaired Initiating Impairment: Verbal basic;Functional basic Self Monitoring: Impaired Self Monitoring Impairment: Verbal basic;Functional basic Self Correcting: Impaired  Self Correcting Impairment: Verbal basic;Functional basic Safety/Judgment: Impaired Brief Interview for Mental Status (BIMS) Repetition of Three Words (First Attempt): 3 Temporal Orientation: Year: Correct Temporal Orientation: Month: Accurate within 5 days Temporal Orientation: Day: Incorrect Recall: Sock: No, could not recall Recall: Blue: No, could not recall Recall: Bed: No, could not recall BIMS Summary Score: 8 Sensation Sensation Light Touch: Impaired Detail Central sensation comments: Hx of neuropathy- pt unable to participate in testing 2/2 cognition. reports numbness in hands and bottom of feet Coordination Gross Motor Movements are Fluid and Coordinated: No Fine Motor Movements are Fluid and Coordinated: Yes Coordination and Movement Description: Ataxic, poor trunk control, generalized weakness Finger Nose Finger Test: WNL Motor  Motor Motor: Ataxia  Trunk/Postural Assessment  Cervical Assessment Cervical Assessment: Within Functional Limits Thoracic Assessment Thoracic Assessment: Within  Functional Limits Lumbar Assessment Lumbar Assessment: Within Functional Limits Postural Control Postural Control: Deficits on evaluation Trunk Control: decreased Righting Reactions: delayed  Balance Balance Balance Assessed: Yes Static Sitting Balance Static Sitting - Balance Support: Feet supported Static Sitting - Level of Assistance: 4: Min assist Dynamic Sitting Balance Dynamic Sitting - Balance Support: Feet supported Dynamic Sitting - Level of Assistance: 4: Min assist Static Standing Balance Static Standing - Balance Support: During functional activity;Bilateral upper extremity supported Static Standing - Level of Assistance: 3: Mod assist Dynamic Standing Balance Dynamic Standing - Balance Support: During functional activity;Bilateral upper extremity supported Dynamic Standing - Level of Assistance: 2: Max assist Extremity/Trunk Assessment RUE Assessment RUE Assessment: Exceptions to [facility] General Strength Comments: Holds in flexion at times, likely from old CVA. Proximal strength at 3+/5 and functional in use LUE Assessment LUE Assessment: Exceptions to [facility] General Strength Comments: Can flex shoulder to about 30 degrees. Positive drop arm test. reports she injured in a fall  Care Tool Care Tool Self Care Eating   Eating Assist Level: Total Assistance - Patient < 25%    Oral Care    Oral Care Assist Level: Total assistance - Patient < 25%    Bathing   Body parts bathed by patient: Right arm;Left arm;Chest;Abdomen;Front perineal area;Right upper leg;Left upper leg;Right lower leg;Left lower leg;Face Body parts bathed by helper: Buttocks   Assist Level: Maximal Assistance - Patient 24 - 49%    Upper Body Dressing(including orthotics)   What is the patient wearing?: Pull over shirt   Assist Level: Total Assistance - Patient < 25%    Lower Body Dressing (excluding footwear)   What is the patient wearing?: Underwear/pull up;Pants Assist for lower body dressing:  Total Assistance - Patient < 25%    Putting on/Taking off footwear   What is the patient wearing?: Non-skid slipper socks Assist for footwear: Dependent - Patient 0%       Care Tool Toileting Toileting activity   Assist for toileting: Dependent - Patient 0%     Care Tool Bed Mobility Roll left and right activity   Roll left and right assist level: Moderate Assistance - Patient 50 - 74%    Sit to lying activity   Sit to lying assist level: Moderate Assistance - Patient 50 - 74%    Lying to sitting on side of bed activity   Lying to sitting on side of bed assist level: the ability to move from lying on the back to sitting on the side of the bed with no back support.: Maximal Assistance - Patient 25 - 49%     Care Tool Transfers Sit to stand transfer   Sit to  stand assist level: Moderate Assistance - Patient 50 - 74%    Chair/bed transfer   Chair/bed transfer assist level: Moderate Assistance - Patient 50 - 74%     Toilet transfer   Assist Level: Moderate Assistance - Patient 50 - 74%     Care Tool Cognition  Expression of Ideas and Wants Expression of Ideas and Wants: 2. Frequent difficulty - frequently exhibits difficulty with expressing needs and ideas  Understanding Verbal and Non-Verbal Content Understanding Verbal and Non-Verbal Content: 2. Sometimes understands - understands only basic conversations or simple, direct phrases. Frequently requires cues to understand   Memory/Recall Ability Memory/Recall Ability : That he or she is in a hospital/hospital unit   Refer to Care Plan for Long Term Goals  SHORT TERM GOAL WEEK 1 OT Short Term Goal 1 (Week 1): Pt will complete UB dressing with mod A and no more than mod cueing OT Short Term Goal 2 (Week 1): Pt will orient her shirt correctly with min cueing OT Short Term Goal 3 (Week 1): Pt will complete stand pivot [facility] with min A OT Short Term Goal 4 (Week 1): Pt will complete toileting hygiene with mod  A  Recommendations for other services: Neuropsych   Skilled Therapeutic Intervention ADL ADL Eating: Maximal assistance Where Assessed-Eating: Bed level Grooming: Moderate assistance Where Assessed-Grooming: Sitting at sink Upper Body Bathing: Moderate assistance Where Assessed-Upper Body Bathing: Sitting at sink Lower Body Bathing: Maximal assistance Where Assessed-Lower Body Bathing: Sitting at sink;Standing at sink Upper Body Dressing: Maximal assistance Where Assessed-Upper Body Dressing: Sitting at sink Lower Body Dressing: Maximal assistance Where Assessed-Lower Body Dressing: Sitting at sink;Standing at sink Toileting: Dependent Where Assessed-Toileting: Teacher, adult education: Moderate assistance Toilet Transfer Method: Stand pivot Toilet Transfer Equipment: Bedside commode Mobility  Bed Mobility Bed Mobility: Supine to Sit;Sit to Supine Supine to Sit: Maximal Assistance - Patient - Patient 25-49% Sit to Supine: Maximal Assistance - Patient 25-49% Transfers Sit to Stand: Moderate Assistance - Patient 50-74% Stand to Sit: Moderate Assistance - Patient 50-74%  Skilled OT evaluation completed with the creation of pt centered OT POC. Pt educated on condition, ELOS, rehab expectations, and fall risk reduction strategies throughout session. Pt limited by severe cognitive deficits, with apraxia, attention, and sequencing deficits impacting ADLs at all levels. She has poor awareness/insight into deficits and had a near fall d/t sitting suddenly during a transfer. Anticipate she will require 24/7 supervision and min A at discharge. Pt required max cueing for attention to having a BM. ADL transfers at mod A level. She returned to her bed and was left with bed alarm set, all needs within reach.    Discharge Criteria: Patient will be discharged from OT if patient refuses treatment 3 consecutive times without medical reason, if treatment goals not met, if there is a change in medical  status, if patient makes no progress towards goals or if patient is discharged from hospital.  The above assessment, treatment plan, treatment alternatives and goals were discussed and mutually agreed upon: No family available/patient unable  [name] 09/02/2023, 12:40 PM

## 2023-09-02 NOTE — Progress Notes (Signed)
 Initial Nutrition Assessment  DOCUMENTATION CODES:   Not applicable  INTERVENTION:  Continue current diet as ordered Ordering assistance Encourage PO intake Assist with feeding pt  Discontinue micronutrients Zinc  sulfate, folic acid , and Vitamin B6 as repletion course finished, recheck labs in 30 days  Continue MVI with minerals 1x/d Continue Ensure Plus High Protein po BID, each supplement provides 350 kcal and 20 grams of protein Add 1 packet Juven BID, each packet provides 95 calories, 2.5 grams of protein (collagen  NUTRITION DIAGNOSIS:   Increased nutrient needs related to wound healing as evidenced by estimated needs.   GOAL:   Patient will meet greater than or equal to 90% of their needs   MONITOR:   PO intake, Supplement acceptance, Diet advancement, Labs, Skin  REASON FOR ASSESSMENT:   Consult Assessment of nutrition requirement/status, Other (Comment), Wound healing (Maintain follow up from acute care)  ASSESSMENT:  Pt with hx of CKD3, COPD, CAD, hx CVA, pancreatitis, right hemiparesis, Hepatitis C, CHF,  and DM type 2 presented to ED from [facility] for non-STEMI and underwent cardiac cath where she was found to have chronic LAD. Now transferred to CIR.  6/11 - admitted 6/13 - intubated 6/16 - Tube feeds started via OGT 6/18 - Cortrak placed  6/20 - extubated 6/26, 6/27 - BSE, NPO 6/30 - MBS, DYS2/thin liquids 7/1 - Cortrak removed  7/6- urine culture shows Klebsiella infection, started on meropenem  7/8- advanced to DYS 3/thin 7/9- FMS removed 7/14 - Transferred to CIR, advanced to regular diet  7/15 - Dysphagia 3 diet, thin liquids    Pt transferred to CIR yesterday, received consult to continue to follow patient through rehab stay.   Pt working with PT on visit. Discussed intake with RN. RN reports pt has been doing well and had 75% of her lunch and dinner last night. Only had 30% of her breakfast this morning and 45% of her lunch. Pt still  needs cueing during meal times but can feed herself. NFPE deferred to FUP. Suspect pt is still with a degree of malnutrition. Pt has lost around 6 lbs since initial admission 6/11. Some weight loss is to be expected due to inactivity.   Admit weight: 55.1 kg Current weight: 52.3 kg    Average Meal Intake: 7/14: 75% intake x 1 recorded meals  Nutritionally Relevant Medications: Scheduled Meds:  arformoterol   15 mcg Nebulization BID   ascorbic acid   1,000 mg Oral Daily   aspirin   81 mg Oral Daily   busPIRone   15 mg Oral BID   Chlorhexidine  Gluconate Cloth  6 each Topical BID   enoxaparin  (LOVENOX ) injection  40 mg Subcutaneous QHS   feeding supplement  237 mL Oral BID BM   folic acid   1 mg Oral Daily   insulin  aspart  0-9 Units Subcutaneous TID WC   insulin  aspart  5 Units Subcutaneous TID WC   insulin  glargine-yfgn  12 Units Subcutaneous Daily   lactulose   10 g Oral BID   lamoTRIgine   25 mg Oral Daily   Followed by   NOREEN ON 09/10/2023] lamoTRIgine   50 mg Oral Daily   Followed by   NOREEN ON 09/24/2023] lamoTRIgine   100 mg Oral Daily   leptospermum manuka honey  1 Application Topical Daily   levothyroxine   100 mcg Oral q morning   multivitamin with minerals  1 tablet Oral Daily   [START ON 09/03/2023] nutrition supplement (JUVEN)  1 packet Oral BID BM   mouth rinse  15 mL  Mouth Rinse Q4H   polyethylene glycol  17 g Oral Daily   revefenacin   175 mcg Nebulization Daily   senna-docusate  1 tablet Oral BID   tamsulosin   0.4 mg Oral Daily   traZODone   50 mg Oral QHS   Labs Reviewed: Albumin 2.3 Total protein 5.9 CBG ranges from 131-187 mg/dL over the last 24 hours HgbA1c 6  NUTRITION - FOCUSED PHYSICAL EXAM: - Deferred to FUP   Diet Order:   Diet Order             DIET DYS 3 Fluid consistency: Thin  Diet effective now                   EDUCATION NEEDS:   Not appropriate for education at this time  Skin:  Skin Assessment: Skin Integrity Issues: Skin Integrity  Issues:: Unstageable Unstageable: Sacrum  Last BM:  08/31/2023 type 4  Height:   Ht Readings from Last 1 Encounters:  09/01/23 5' (1.524 m)    Weight:   Wt Readings from Last 1 Encounters:  09/02/23 52.3 kg    BMI:  Body mass index is 22.52 kg/m.  Estimated Nutritional Needs:   Kcal:  1500-1700 kcal  Protein:  75-90 gm /day  Fluid:  >1.5L/day   Sarah Davis, RD Registered Dietitian  See Amion for more information

## 2023-09-02 NOTE — Progress Notes (Signed)
 Notified Dr. Emeline of shoulder x-ray results.   Geni Armor, LPN

## 2023-09-02 NOTE — Progress Notes (Signed)
 Inpatient Rehabilitation Care Coordinator Assessment and Plan Patient Details  Name: Sarah Davis MRN: [id number] Date of Birth: 30-Apr-1960  Today's Date: 09/02/2023  Hospital Problems: Principal Problem:   Acute metabolic encephalopathy  Past Medical History:  Past Medical History:  Diagnosis Date   Anxiety    Bipolar 2 disorder (HCC)    Chronic hepatitis C virus genotype 1 infection (HCC)    Chronic pancreatitis (HCC)    CKD (chronic kidney disease) stage 4, GFR 15-29 ml/min (HCC)    COPD (chronic obstructive pulmonary disease) (HCC)    Coronary artery disease 11/2019   s/p PTCI  LCx & RCA. Complicated by Vfib arrest   DM (diabetes mellitus), type 2 (HCC)    HFrEF (heart failure with reduced ejection fraction) (HCC) 08/01/2023   EF 45-50%   Hyperparathyroidism, secondary (HCC)    Hypothyroid    Lithium toxicity    Marijuana abuse    NSTEMI (non-ST elevated myocardial infarction) (HCC) 07/30/2023   med management   Peripheral autonomic neuropathy due to DM [facility])    STEMI (ST elevation myocardial infarction) (HCC) 2021   Stroke [facility])    right hemiparesis   Substance abuse (HCC)    Tardive dyskinesia    Tobacco abuse    Past Surgical History:  Past Surgical History:  Procedure Laterality Date   CARDIAC CATHETERIZATION     CHOLECYSTECTOMY     LEFT HEART CATH AND CORONARY ANGIOGRAPHY N/A 07/31/2023   Patent LCx & RCA stents. Chronic occlussion mLAD with collaterals=med management   Social History:  reports that she has been smoking cigarettes. She started smoking about 29 years ago. She has a 14.8 pack-year smoking history. She has never used smokeless tobacco. She reports current drug use. Drugs: Benzodiazepines and Marijuana. She reports that she does not drink alcohol.  Family / Support Systems Marital Status: Single Patient Roles: Parent Spouse/Significant Other: N/A Children: 3 children- August (lives in [place] and will d/c to her home), Ubaldo Don (lives in  [place]), and Georgette Dines (lives in [place] but travels for work often). Other Supports: mother- [name] Dines Anticipated Caregiver: Mother and dtr Ability/Limitations of Caregiver: Pt dtr works during the day, and her mother will be available to assist. SW will confirm there are no barriers to discharge. Caregiver Availability: 24/7 Family Dynamics: Pt was living alone and has a roommate? Vera that stays in her home.  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 Legal History/Current Legal Issues: jail hx due to fraud  years ago reported by pt and mother. No current issues. Guardian/Conservator: Denies. Discussed with pt mother to consider interim guardianship considering that pt has some deficits.   Abuse/Neglect Abuse/Neglect Assessment Can Be Completed: Unable to assess, patient is non-responsive or altered mental status Physical Abuse: Denies Verbal Abuse: Denies Sexual Abuse: Denies Exploitation of patient/patient's resources: Denies Self-Neglect: Denies  Patient response to: Social Isolation - How often do you feel lonely or isolated from those around you?: Patient unable to respond  Emotional Status Pt's affect, behavior and adjustment status: Pt has flat affect. Requires assistance with answering SW questions. She appears to be in good spirits at time of visit. Recent Psychosocial  Issues: Denies Psychiatric History: HX of bipolar disorder; provider in [facility]. Lsat psych hospitalizations was about 10 years ago. Substance Abuse History: Pt admits to smoking one pk of  cigarettes per day and smokes marijuana. Denies etoh use.  Patient / Family Perceptions, Expectations & Goals Pt/Family understanding of illness & functional limitations: Pt family has a general understanding of care needs. Premorbid pt/family roles/activities: Independent Anticipated changes in roles/activities/participation: Assistance with ADLs/IADLs Pt/family expectations/goals: Pt goal is to work on walking, getting strength back in legs, and getting her life back on track/. Pt mother would like to work on her getting back to where she was before, mental health, and focusing.  Community Resources [employer]: None Premorbid Home Care/DME Agencies: None Transportation available at discharge: TBD Is the patient able to respond to transportation needs?: Yes In the past 12 months, has lack of transportation kept you from medical appointments or from getting medications?: No In the past 12 months, has lack of transportation kept you from meetings, work, or from getting things needed for daily living?: No Resource referrals recommended: Neuropsychology  Discharge Planning Living Arrangements: Non-relatives/Friends Support Systems: Children, Parent Type of Residence: Private residence Insurance Resources: Media planner (specify) Secondary school teacher) Financial Resources: [employer] Financial Screen Referred: No Living Expenses: Rent Money Management: Patient Does the patient have any problems obtaining your medications?: No Home Management: Pt managed all home care needs Patient/Family Preliminary Plans: PT will need assistance. Pt mother reports they discovered there are new financial issues that they were not aware of primary to her coming into the hospital. Care Coordinator Barriers to Discharge: Decreased caregiver support, Insurance for SNF coverage, Lack of/limited family support Care Coordinator Anticipated Follow Up Needs: HH/OP  Clinical Impression SW met with pt and pt  mother in room. Pt mother answered majority of SW questions as pt has poor attention, and did not answer questions correctly. Pt mother [name] will be primary contact. She confirms pt will d/c to her daughter's home in [place]. SW will follow-up with updates as available.   Jobin Montelongo A Malaak Stach 09/02/2023, 3:10 PM

## 2023-09-02 NOTE — Evaluation (Signed)
 Speech Language Pathology Assessment and Plan  Patient Details  Name: Sarah Davis MRN: [id number] Date of Birth: 1961-01-12  SLP Diagnosis: Aphasia;Cognitive Impairments;Speech and Language deficits;Apraxia;Dysphagia  Rehab Potential: Good ELOS: 4 weeks    Today's Date: 09/02/2023 SLP Individual Time: 1100-1200 SLP Individual Time Calculation (min): 60 min   Hospital Problem: Principal Problem:   Acute metabolic encephalopathy  Past Medical History:  Past Medical History:  Diagnosis Date   Anxiety    Bipolar 2 disorder (HCC)    Chronic hepatitis C virus genotype 1 infection (HCC)    Chronic pancreatitis (HCC)    CKD (chronic kidney disease) stage 4, GFR 15-29 ml/min (HCC)    COPD (chronic obstructive pulmonary disease) (HCC)    Coronary artery disease 11/2019   s/p PTCI  LCx & RCA. Complicated by Vfib arrest   DM (diabetes mellitus), type 2 (HCC)    HFrEF (heart failure with reduced ejection fraction) (HCC) 08/01/2023   EF 45-50%   Hyperparathyroidism, secondary (HCC)    Hypothyroid    Lithium toxicity    Marijuana abuse    NSTEMI (non-ST elevated myocardial infarction) (HCC) 07/30/2023   med management   Peripheral autonomic neuropathy due to DM [facility])    STEMI (ST elevation myocardial infarction) (HCC) 2021   Stroke [facility])    right hemiparesis   Substance abuse (HCC)    Tardive dyskinesia    Tobacco abuse    Past Surgical History:  Past Surgical History:  Procedure Laterality Date   CARDIAC CATHETERIZATION     CHOLECYSTECTOMY     LEFT HEART CATH AND CORONARY ANGIOGRAPHY N/A 07/31/2023   Patent LCx & RCA stents. Chronic occlussion mLAD with collaterals=med management    Assessment / Plan / Recommendation Clinical Impression Pt is a 63 year old right-handed female with past medical history of bipolar disorder on lamictal  and Buspar , CKD stage III, COPD tobacco use as well as marijuana, chronic pancreatitis, history of CVA with right hemiparesis, chronic hepatitis C  virus genotype, diastolic congestive heart failure, diabetes mellitus with peripheral neuropathy, tardive dyskinesia. Per chart review patient lives alone. 1 level apartment with elevator. Uses a rolling walker for mobility. She does not drive. Family and friends do her grocery shopping and housecleaning. Presented [facility]/28/[facility] to [facility] with metabolic encephalopathy due to hypoglycemia given D10 gtt. However as patient improved from hypoglycemic event found to have non-STEMI and transferred to [facility]. She underwent cardiac catheterization 07/31/2023 found to have chronic LAD. Hospital course patient developed acute respiratory failure required intubation emergently at the end of catheterization. She was also noted to have worsening altered mental status. EEG was negative. MRI negative for anything acute she stayed sedated and found to have ammonia level of 250 on 6/16 and started on lactulose  and carnitine. Her ammonia levels did improve into the 70s with treatment however by 6/21 her LFTs started to increase due to carnitine and was transferred to the ICU. A Cortrak was placed and diet slowly advanced to a regular consistency. Developed leukocytosis 18,000 found to have UTI started on Rocephin  initially with and her antibiotics were changed to meropenem  due to ESBL Klebsiella and antibiotic regimen has been completed and remains on contact precautions. Follow-up cardiology services again after non-STEMI as well as acute on chronic CHF treatment but limited due to hypotension. She did sustain a fall on 7/11 when she slipped in her room cranial CT scan repeated that again was negative. She remains on Lovenox  for DVT prophylaxis. Aspirin  ongoing after non-STEMI. Therapy evaluations  completed due to patient's debility was admitted for a comprehensive rehab program.   Cognitive-Linguistic: Pt presented w/ profound cognitive-linguistic deficits overall. She completed portions of the Bedside WAB,  which revealed severe deficits w/ auditory comprehension of 2-3 step commands and mildly complex Y/N questions. She also demonstrated moderate deficits answering simple Y/N questions when gathering her history. Expressive deficits were severe to profound overall, though variable performance noted. Initially, she was unable to verbalize her birthday and age, however, was able to verbalize age, DOB, and address by the end of eval tasks. Also presented w/ adequate speech intelligibility overall, though moments of reduced articulatory precision and vocal intensity were noted intermittently. Anticipate attention deficits significantly impacted her performance. In addition to attention deficits, she also demonstrated orientation and STM deficits (despite binary choices) as well as profound initiation deficits. Pt frequently required repetitions and cues to initiate responses and no spontaneous utterances were noted. She demonstrated no awareness of errors throughout.   Bedside Swallow: PO trials completed w/ regular textures (saltines) and thin liquids via straw. No overt s/s of airway invasion noted. Timely mastication noted, however, pt did endorse difficulty w/ certain textures given ill fitting bottom dentures. Given dentition and overall reduced attention/alertness, she was agreeable to Dys3 textures and thin liquids. Recommend continued full supervision during meals given cognitive deficits and meds whole via liquid.   She would benefit from skilled ST services to target aforementioned deficits, maximize pt independence/communication/swallow safety, reduce caregiver burden, and facilitate return to prev roles/responsibilities.       Skilled Therapeutic Interventions          SLP facilitated a cognitive-linguistic evaluation and bedside swallow screen to assess pt's cognitive-communication skills and determine need for additional skilled ST services. See above for more information.    SLP Assessment   Patient will need skilled Speech Lanaguage Pathology Services during CIR admission    Recommendations  Medication Administration: Whole meds with liquid Supervision: Full supervision/cueing for compensatory strategies;Patient able to self feed Compensations: Small sips/bites;Slow rate;Minimize environmental distractions Postural Changes and/or Swallow Maneuvers: Seated upright 90 degrees Oral Care Recommendations: Oral care BID Recommendations for Other Services: Neuropsych consult Patient destination: Home Follow up Recommendations: 24 hour supervision/assistance;Home Health SLP;Outpatient SLP Equipment Recommended: None recommended by SLP    SLP Frequency 3 to 5 out of 7 days   SLP Duration  SLP Intensity  SLP Treatment/Interventions 4 weeks  Minumum of 1-2 x/day, 30 to 90 minutes  Cognitive remediation/compensation;Functional tasks;Cueing hierarchy;Internal/external aids;Patient/family education;Speech/Language facilitation;Dysphagia/aspiration precaution training;Therapeutic Exercise    Pain  No pain reported  Prior Functioning Type of Home: Apartment  Lives With: Alone Available Help at Discharge: Other (Comment) (per [facility] pt will dc to daughters house)  SLP Evaluation Cognition Overall Cognitive Status: Impaired/Different from baseline Arousal/Alertness: Awake/alert Orientation Level: Oriented to person;Oriented to place Year: 2025 Month: July Day of Week: Incorrect Attention: Sustained Sustained Attention: Impaired Sustained Attention Impairment: Verbal basic;Functional basic Memory: Impaired Memory Impairment: Storage deficit;Retrieval deficit;Decreased short term memory Decreased Short Term Memory: Verbal basic Awareness: Impaired Awareness Impairment: Intellectual impairment Problem Solving: Impaired Problem Solving Impairment: Verbal basic;Functional basic Executive Function: Initiating;Self Monitoring;Self Correcting Initiating: Impaired Initiating  Impairment: Verbal basic;Functional basic Self Monitoring: Impaired Self Monitoring Impairment: Verbal basic;Functional basic Self Correcting: Impaired Self Correcting Impairment: Verbal basic;Functional basic Safety/Judgment: Impaired  Comprehension Auditory Comprehension Overall Auditory Comprehension: Impaired Yes/No Questions: Impaired Basic Biographical Questions: 76-100% accurate Basic Immediate Environment Questions: 75-100% accurate Complex Questions: 75-100% accurate Commands: Impaired One Step Basic Commands: 75-100% accurate  Two Step Basic Commands: 50-74% accurate Multistep Basic Commands: 25-49% accurate Conversation: Simple Interfering Components: Attention;Motor planning;Processing speed;Working Radio broadcast assistant: Pharmacist, community Discrimination: Not tested Reading Comprehension Reading Status: Within funtional limits (for sentence level) Expression Expression Primary Mode of Expression: Verbal Verbal Expression Overall Verbal Expression: Impaired Initiation: Impaired Level of Generative/Spontaneous Verbalization: Phrase Repetition: No impairment Naming: Impairment Responsive: 76-100% accurate Confrontation: Impaired Divergent: 50-74% accurate Verbal Errors: Semantic paraphasias;Perseveration;Echolalia Pragmatics: Impairment Impairments: Abnormal affect;Topic maintenance;Turn Taking Interfering Components: Attention;Speech intelligibility Effective Techniques: Sentence completion;Phonemic cues Written Expression Dominant Hand: Right Written Expression: Exceptions to [facility] Dictation Ability: Phrase Self Formulation Ability: Word Oral Motor Motor Speech Overall Motor Speech: Impaired (variable, depending on attention) Respiration: Within functional limits Resonance: Within functional limits Articulation: Impaired Intelligibility: Intelligibility reduced (variable depending on  attention) Word: 75-100% accurate Phrase: 75-100% accurate Sentence: 50-74% accurate Conversation: 50-74% accurate Motor Speech Errors: Unaware Interfering Components: Inadequate dentition (no bottom dentures) Effective Techniques: Increased vocal intensity;Over-articulate  Care Tool Care Tool Cognition Ability to hear (with hearing aid or hearing appliances if normally used Ability to hear (with hearing aid or hearing appliances if normally used): 0. Adequate - no difficulty in normal conservation, social interaction, listening to TV   Expression of Ideas and Wants Expression of Ideas and Wants: 2. Frequent difficulty - frequently exhibits difficulty with expressing needs and ideas   Understanding Verbal and Non-Verbal Content Understanding Verbal and Non-Verbal Content: 2. Sometimes understands - understands only basic conversations or simple, direct phrases. Frequently requires cues to understand  Memory/Recall Ability Memory/Recall Ability : That he or she is in a hospital/hospital unit   Motor Speech Assessment  Intelligibility: Intelligibility reduced (variable depending on attention) Word: 75-100% accurate Phrase: 75-100% accurate Sentence: 50-74% accurate Conversation: 50-74% accurate  Bedside Swallowing Assessment General Date of Onset: 07/31/23 Diet Prior to this Study: Thin liquids (Level 0);Regular Temperature Spikes Noted: No Respiratory Status: Room air History of Recent Intubation: Yes Total duration of intubation (days): 7 days Date extubated: 08/08/23 Behavior/Cognition: Alert;Requires cueing Oral Cavity - Dentition: Dentures, top Self-Feeding Abilities: Able to feed self;Needs assist;Needs set up Patient Positioning: Upright in bed Baseline Vocal Quality: Normal Volitional Cough: Strong Volitional Swallow: Able to elicit    Short Term Goals: Week 1: SLP Short Term Goal 1 (Week 1): Pt will utilize calendar as needed to verbalize orientation information w/  modA SLP Short Term Goal 2 (Week 1): Pt will utilize external memory aids as needed to recall recent information w/ 50% accuracy given maxA SLP Short Term Goal 3 (Week 1): Pt will complete functional language tasks w/ modA SLP Short Term Goal 4 (Week 1): Pt will sustain attention for 10 mins w/ maxA SLP Short Term Goal 5 (Week 1): Pt will improve initiation during functional and structured tasks to maxA SLP Short Term Goal 6 (Week 1): Pt will solve simple problems during functional and structured cognitive tasks w/ maxA  Refer to Care Plan for Long Term Goals  Recommendations for other services: Neuropsych and Therapeutic Recreation  Pet therapy  Discharge Criteria: Patient will be discharged from SLP if patient refuses treatment 3 consecutive times without medical reason, if treatment goals not met, if there is a change in medical status, if patient makes no progress towards goals or if patient is discharged from hospital.  The above assessment, treatment plan, treatment alternatives and goals were discussed and mutually agreed upon: by patient  Recardo DELENA Mole 09/02/2023, 3:45 PM

## 2023-09-02 NOTE — Plan of Care (Signed)
  Problem: RH Swallowing Goal: LTG Patient will consume least restrictive diet using compensatory strategies with assistance (SLP) Description: LTG:  Patient will consume least restrictive diet using compensatory strategies with assistance (SLP) Flowsheets (Taken 09/02/2023 1617) LTG: Pt Patient will consume least restrictive diet using compensatory strategies with assistance of (SLP): Supervision   Problem: RH Cognition - SLP Goal: RH LTG Patient will demonstrate orientation with cues Description:  LTG:  Patient will demonstrate orientation to person/place/time/situation with cues (SLP)   Flowsheets (Taken 09/02/2023 1617) LTG Patient will demonstrate orientation to:  Place  Person  Time  Situation LTG: Patient will demonstrate orientation using cueing (SLP): Minimal Assistance - Patient > 75% Note: W/ external memory aids as needed   Problem: RH Expression Communication Goal: LTG Patient will verbally express basic/complex needs(SLP) Description: LTG:  Patient will verbally express basic/complex needs, wants or ideas with cues  (SLP) Flowsheets (Taken 09/02/2023 1617) LTG: Patient will verbally express basic/complex needs, wants or ideas (SLP): Supervision Goal: LTG Patient will increase word finding of common (SLP) Description: LTG:  Patient will increase word finding of common objects/daily info/abstract thoughts with cues using compensatory strategies (SLP). Flowsheets (Taken 09/02/2023 1617) LTG: Patient will increase word finding of common (SLP): Supervision   Problem: RH Problem Solving Goal: LTG Patient will demonstrate problem solving for (SLP) Description: LTG:  Patient will demonstrate problem solving for basic/complex daily situations with cues  (SLP) Flowsheets (Taken 09/02/2023 1617) LTG: Patient will demonstrate problem solving for (SLP): Basic daily situations LTG Patient will demonstrate problem solving for: Minimal Assistance - Patient > 75%   Problem: RH  Memory Goal: LTG Patient will use memory compensatory aids to (SLP) Description: LTG:  Patient will use memory compensatory aids to recall biographical/new, daily complex information with cues (SLP) Flowsheets (Taken 09/02/2023 1617) LTG: Patient will use memory compensatory aids to (SLP): Minimal Assistance - Patient > 75% Note: Recall recent/relevant info    Problem: RH Attention Goal: LTG Patient will demonstrate this level of attention during functional activites (SLP) Description: LTG:  Patient will will demonstrate this level of attention during functional activites (SLP) Flowsheets (Taken 09/02/2023 1617) Patient will demonstrate during cognitive/linguistic activities the attention type of: Sustained Patient will demonstrate this level of attention during cognitive/linguistic activities in: Controlled LTG: Patient will demonstrate this level of attention during cognitive/linguistic activities with assistance of (SLP): Minimal Assistance - Patient > 75% Number of minutes patient will demonstrate attention during cognitive/linguistic activities: 20

## 2023-09-02 NOTE — Progress Notes (Signed)
 Inpatient Rehabilitation  Patient information reviewed and entered into eRehab system by [facility]. Karen Kays., CCC/SLP, PPS Coordinator.  Information including medical coding, functional ability and quality indicators will be reviewed and updated through discharge.

## 2023-09-02 NOTE — Evaluation (Signed)
 Physical Therapy Assessment and Plan  Patient Details  Name: Sarah Davis MRN: [id number] Date of Birth: [birth year]  PT Diagnosis: Ataxic gait, Cognitive deficits, Difficulty walking, and Muscle weakness Rehab Potential: Fair ELOS: 21-28 Days   Today's Date: 09/02/2023 PT Individual Time: 1300-1415 PT Individual Time Calculation (min): 75 min    Hospital Problem: Principal Problem:   Acute metabolic encephalopathy   Past Medical History:  Past Medical History:  Diagnosis Date   Anxiety    Bipolar 2 disorder (HCC)    Chronic hepatitis C virus genotype 1 infection (HCC)    Chronic pancreatitis (HCC)    CKD (chronic kidney disease) stage 4, GFR 15-29 ml/min (HCC)    COPD (chronic obstructive pulmonary disease) (HCC)    Coronary artery disease 11/2019   s/p PTCI  LCx & RCA. Complicated by Vfib arrest   DM (diabetes mellitus), type 2 (HCC)    HFrEF (heart failure with reduced ejection fraction) (HCC) 08/01/2023   EF 45-50%   Hyperparathyroidism, secondary (HCC)    Hypothyroid    Lithium toxicity    Marijuana abuse    NSTEMI (non-ST elevated myocardial infarction) (HCC) 07/30/2023   med management   Peripheral autonomic neuropathy due to DM [facility])    STEMI (ST elevation myocardial infarction) (HCC) 2021   Stroke [facility])    right hemiparesis   Substance abuse (HCC)    Tardive dyskinesia    Tobacco abuse    Past Surgical History:  Past Surgical History:  Procedure Laterality Date   CARDIAC CATHETERIZATION     CHOLECYSTECTOMY     LEFT HEART CATH AND CORONARY ANGIOGRAPHY N/A 07/31/2023   Patent LCx & RCA stents. Chronic occlussion mLAD with collaterals=med management    Assessment & Plan Clinical Impression: Patient is a 63 year old right-handed female with past medical history of bipolar disorder on lamictal  and Buspar , CKD stage III, COPD tobacco use as well as marijuana, chronic pancreatitis, history of CVA with right hemiparesis, chronic hepatitis C virus genotype, diastolic  congestive heart failure, diabetes mellitus with peripheral neuropathy, tardive dyskinesia. Per chart review patient lives alone. 1 level apartment with elevator. Uses a rolling walker for mobility. She does not drive. Family and friends do her grocery shopping and housecleaning. Presented 07/28/2023 to [facility] with metabolic encephalopathy due to hypoglycemia given D10 gtt. However as patient improved from hypoglycemic event found to have non-STEMI and transferred to [name]. She underwent cardiac catheterization 07/31/2023 found to have chronic LAD. Hospital course patient developed acute respiratory failure required intubation emergently at the end of catheterization. She was also noted to have worsening altered mental status. EEG was negative. MRI negative for anything acute she stayed sedated and found to have ammonia level of 250 on 6/16 and started on lactulose  and carnitine. Her ammonia levels did improve into the 70s with treatment however by 6/21 her LFTs started to increase due to carnitine and was transferred to the ICU. A Cortrak was placed and diet slowly advanced to a regular consistency. Developed leukocytosis 18,000 found to have UTI started on Rocephin  initially with and her antibiotics were changed to meropenem  due to ESBL Klebsiella and antibiotic regimen has been completed and remains on contact precautions. Follow-up cardiology services again after non-STEMI as well as acute on chronic CHF treatment but limited due to hypotension. She did sustain a fall on 7/11 when she slipped in her room cranial CT scan repeated that again was negative. She remains on Lovenox  for DVT prophylaxis. Aspirin  ongoing after non-STEMI.  Patient transferred to CIR on 09/01/2023 .   Patient currently requires max with mobility secondary to muscle weakness, decreased cardiorespiratoy endurance, motor apraxia, ataxia, decreased coordination, and decreased motor planning, decreased initiation,  decreased attention, decreased awareness, decreased problem solving, decreased safety awareness, decreased memory, and delayed processing, and decreased sitting balance, decreased standing balance, decreased postural control, and decreased balance strategies.  Prior to hospitalization, patient was independent  with mobility and lived with Alone in a Apartment home.  Home access is  Elevator.  Patient will benefit from skilled PT intervention to maximize safe functional mobility, minimize fall risk, and decrease caregiver burden for planned discharge home with 24 hour assist.  Anticipate patient will benefit from follow up [facility] at discharge.  PT - End of Session Activity Tolerance: Tolerates 10 - 20 min activity with multiple rests Endurance Deficit: Yes PT Assessment Rehab Potential (ACUTE/IP ONLY): Fair PT Barriers to Discharge: Incontinence;Decreased caregiver support;Wound Care;Behavior PT Patient demonstrates impairments in the following area(s): Balance;Behavior;Endurance;Motor;Pain;Nutrition;Perception;Safety;Skin Integrity PT Transfers Functional Problem(s): Bed Mobility;Bed to Chair;Car;Furniture PT Locomotion Functional Problem(s): Ambulation;Wheelchair Mobility;Stairs PT Plan PT Intensity: Minimum of 1-2 x/day ,45 to 90 minutes PT Frequency: 5 out of 7 days PT Duration Estimated Length of Stay: 21-28 Days PT Treatment/Interventions: Ambulation/gait training;Community reintegration;DME/adaptive equipment instruction;Neuromuscular re-education;Psychosocial support;Stair training;UE/LE Strength taining/ROM;Wheelchair propulsion/positioning;Balance/vestibular training;Discharge planning;Functional electrical stimulation;Pain management;Skin care/wound management;Therapeutic Activities;UE/LE Coordination activities;Cognitive remediation/compensation;Disease management/prevention;Functional mobility training;Patient/family education;Splinting/orthotics;Therapeutic Exercise;Visual/perceptual  remediation/compensation PT Transfers Anticipated Outcome(s): MinA PT Locomotion Anticipated Outcome(s): MinA PT Recommendation Recommendations for Other Services: Neuropsych consult Follow Up Recommendations: Home health PT;24 hour supervision/assistance Patient destination: Home Equipment Recommended: To be determined   PT Evaluation Precautions/Restrictions Restrictions Weight Bearing Restrictions Per Provider Order: No General Chart Reviewed: Yes Family/Caregiver Present: Yes  Pain Interference Pain Interference Pain Effect on Sleep: 1. Rarely or not at all Pain Interference with Therapy Activities: 1. Rarely or not at all Pain Interference with Day-to-Day Activities: 1. Rarely or not at all Home Living/Prior Functioning Home Living Available Help at Discharge: Other (Comment) (per [facility] pt will dc to daughters house) Type of Home: Apartment Home Access: Elevator Home Layout: One level Bathroom Shower/Tub: Engineer, manufacturing systems: Standard Bathroom Accessibility: Yes Additional Comments: lives in 4th floor apartment. can go to daughter's house which is single story with 6 steps to enter- per EMR. Pt unreliable historian  Lives With: Alone Prior Function Level of Independence: Requires assistive device for independence;Independent with basic ADLs;Independent with gait (need to confirm with family) Vision/Perception  Vision - History Ability to See in Adequate Light: 0 Adequate Perception Perception: Within Functional Limits Praxis Praxis: Impaired Praxis Impairment Details: Initiation;Ideation;Ideomotor;Motor planning  Cognition Overall Cognitive Status: Impaired/Different from baseline Arousal/Alertness: Awake/alert Orientation Level: Oriented to person;Oriented to place Year: 2025 Month: July Day of Week: Incorrect Attention: Sustained Sustained Attention: Impaired Sustained Attention Impairment: Verbal basic;Functional basic Memory: Impaired Memory  Impairment: Storage deficit;Retrieval deficit;Decreased short term memory Decreased Short Term Memory: Verbal basic Awareness: Impaired Awareness Impairment: Intellectual impairment Problem Solving: Impaired Problem Solving Impairment: Verbal basic;Functional basic Executive Function: Initiating;Self Monitoring;Self Correcting Initiating: Impaired Initiating Impairment: Verbal basic;Functional basic Self Monitoring: Impaired Self Monitoring Impairment: Verbal basic;Functional basic Self Correcting: Impaired Self Correcting Impairment: Verbal basic;Functional basic Safety/Judgment: Impaired Sensation Sensation Light Touch: Impaired Detail Central sensation comments: Hx of neuropathy- pt unable to participate in testing 2/2 cognition. reports numbness in hands and bottom of feet Coordination Gross Motor Movements are Fluid and Coordinated: No Fine Motor Movements are Fluid and Coordinated: Yes Coordination and Movement Description: Ataxic,  poor trunk control, generalized weakness Finger Nose Finger Test: WNL Motor  Motor Motor: Ataxia  Trunk/Postural Assessment  Cervical Assessment Cervical Assessment: Within Functional Limits Thoracic Assessment Thoracic Assessment: Within Functional Limits Lumbar Assessment Lumbar Assessment: Within Functional Limits Postural Control Postural Control: Deficits on evaluation Trunk Control: decreased Righting Reactions: delayed  Balance Balance Balance Assessed: Yes Static Sitting Balance Static Sitting - Balance Support: Feet supported Static Sitting - Level of Assistance: 4: Min assist Dynamic Sitting Balance Dynamic Sitting - Balance Support: Feet supported Dynamic Sitting - Level of Assistance: 4: Min assist Static Standing Balance Static Standing - Balance Support: During functional activity;Bilateral upper extremity supported Static Standing - Level of Assistance: 3: Mod assist Dynamic Standing Balance Dynamic Standing - Balance  Support: During functional activity;Bilateral upper extremity supported Dynamic Standing - Level of Assistance: 2: Max assist Extremity Assessment  RUE Assessment RUE Assessment: Exceptions to [facility] General Strength Comments: Holds in flexion at times, likely from old CVA. Proximal strength at 3+/5 and functional in use LUE Assessment LUE Assessment: Exceptions to [facility] General Strength Comments: Can flex shoulder to about 30 degrees. Positive drop arm test. reports she injured in a fall RLE Assessment RLE Assessment: Exceptions to [facility] General Strength Comments: Grossly at least 3/5 LLE Assessment LLE Assessment: Exceptions to [facility] General Strength Comments: Grossly at least 3/5  Care Tool Care Tool Bed Mobility Roll left and right activity   Roll left and right assist level: Moderate Assistance - Patient 50 - 74%    Sit to lying activity   Sit to lying assist level: Moderate Assistance - Patient 50 - 74%    Lying to sitting on side of bed activity   Lying to sitting on side of bed assist level: the ability to move from lying on the back to sitting on the side of the bed with no back support.: Maximal Assistance - Patient 25 - 49%     Care Tool Transfers Sit to stand transfer   Sit to stand assist level: Moderate Assistance - Patient 50 - 74%    Chair/bed transfer   Chair/bed transfer assist level: Moderate Assistance - Patient 50 - 74%    Car transfer   Car transfer assist level: Moderate Assistance - Patient 50 - 74%      Care Tool Locomotion Ambulation   Assist level: 2 helpers Assistive device: Walker-rolling Max distance: 15'  Walk 10 feet activity   Assist level: 2 helpers Assistive device: Walker-rolling   Walk 50 feet with 2 turns activity Walk 50 feet with 2 turns activity did not occur: Safety/medical concerns      Walk 150 feet activity Walk 150 feet activity did not occur: Safety/medical concerns      Walk 10 feet on uneven surfaces activity Walk 10 feet  on uneven surfaces activity did not occur: Safety/medical concerns      Stairs   Assist level: Moderate Assistance - Patient - 50 - 74% Stairs assistive device: 2 hand rails Max number of stairs: 2  Walk up/down 1 step activity   Walk up/down 1 step (curb) assist level: Moderate Assistance - Patient - 50 - 74% Walk up/down 1 step or curb assistive device: 2 hand rails  Walk up/down 4 steps activity Walk up/down 4 steps activity did not occur: Safety/medical concerns      Walk up/down 12 steps activity Walk up/down 12 steps activity did not occur: Safety/medical concerns      Pick up small objects from floor   Pick  up small object from the floor assist level: Dependent - Patient 0%    Wheelchair Is the patient using a wheelchair?: Yes Type of Wheelchair: Manual   Wheelchair assist level: Dependent - Patient 0% Max wheelchair distance: 150'  Wheel 50 feet with 2 turns activity   Assist Level: Dependent - Patient 0%  Wheel 150 feet activity   Assist Level: Dependent - Patient 0%    Refer to Care Plan for Long Term Goals  SHORT TERM GOAL WEEK 1 PT Short Term Goal 1 (Week 1): Pt will complete bed moblility with minA consistently. PT Short Term Goal 2 (Week 1): Pt will complete sit to stand with minA consistently. PT Short Term Goal 3 (Week 1): Pt will complete bed to chair with minA consistently. PT Short Term Goal 4 (Week 1): Pt will ambulate x50' with modA and LRAD.  Recommendations for other services: Neuropsych  Skilled Therapeutic Intervention  Evaluation completed (see details above and below) with education on PT POC and goals and individual treatment initiated with focus on bed mobility, balance, transfers, ambulating, and stair training. Pt received supine in bed and agrees to therapy. No complaint of pain. Pt performs supine to sit with modA and cues for sequencing and positioning. Pt completes bed to chair transfer with modA and RW, with cues for hand, placement,  positioning, sequencing, and posture. WC transport to gym for time management. Pt attempts ambulation and able to complete x15' with modA overall, but begins to become anxious and increasingly apraxic, demonstrating poor safety awareness and requiring totalA to prevent fall and +2 assist to bring WC to sit down. Following seated rest break, pt completes x2 6 steps with bilateral handrails and cues for positioning, posture, and safety, requiring modA overall. Pt completes car transfer with modA and cues for hand placement and sequencing. WC transport back to room. Stand step to bed with modA and no AD. Left in Lt sidelying with all needs within reahc.   Mobility Bed Mobility Bed Mobility: Supine to Sit;Sit to Supine Supine to Sit: Maximal Assistance - Patient - Patient 25-49% Sit to Supine: Maximal Assistance - Patient 25-49% Transfers Transfers: Sit to Stand;Stand to Sit;Stand Pivot Transfers Sit to Stand: Moderate Assistance - Patient 50-74% Stand to Sit: Moderate Assistance - Patient 50-74% Stand Pivot Transfers: Moderate Assistance - Patient 50 - 74% Stand Pivot Transfer Details: Tactile cues for posture;Tactile cues for placement;Tactile cues for sequencing;Verbal cues for technique;Verbal cues for precautions/safety Transfer (Assistive device): Rolling walker Locomotion  Gait Ambulation: Yes Gait Assistance: 2 Helpers Gait Distance (Feet): 15 Feet Assistive device: Rolling walker Gait Assistance Details: Verbal cues for sequencing;Verbal cues for technique;Verbal cues for precautions/safety;Tactile cues for posture;Tactile cues for sequencing;Tactile cues for weight shifting;Tactile cues for initiation Gait Gait: Yes Gait Pattern: Impaired Gait Pattern: Ataxic;Wide base of support Gait velocity: dec Stairs / Additional Locomotion Stairs: Yes Stairs Assistance: Moderate Assistance - Patient 50 - 74% Stair Management Technique: Two rails Number of Stairs: 2 Height of Stairs:  6 Ramp: 2 Helpers Curb: Moderate Assistance - Patient 50 - 74% Wheelchair Mobility Wheelchair Mobility: No   Discharge Criteria: Patient will be discharged from PT if patient refuses treatment 3 consecutive times without medical reason, if treatment goals not met, if there is a change in medical status, if patient makes no progress towards goals or if patient is discharged from hospital.  The above assessment, treatment plan, treatment alternatives and goals were discussed and mutually agreed upon: by patient and by family  [name], PT, DPT 09/02/2023, 4:06 PM

## 2023-09-03 DIAGNOSIS — G9341 Metabolic encephalopathy: Secondary | ICD-10-CM | POA: Diagnosis not present

## 2023-09-03 LAB — GLUCOSE, CAPILLARY
Glucose-Capillary: 147 mg/dL — ABNORMAL HIGH (ref 70–99)
Glucose-Capillary: 175 mg/dL — ABNORMAL HIGH (ref 70–99)
Glucose-Capillary: 168 mg/dL — ABNORMAL HIGH (ref 70–99)
Glucose-Capillary: 144 mg/dL — ABNORMAL HIGH (ref 70–99)

## 2023-09-03 MED ORDER — HYDROCORTISONE (PERIANAL) 2.5 % EX CREA
TOPICAL_CREAM | Freq: Three times a day (TID) | CUTANEOUS | Status: DC | PRN
  Filled 2023-09-03: qty 28.35

## 2023-09-03 MED ORDER — WHITE PETROLATUM EX OINT
TOPICAL_OINTMENT | CUTANEOUS | Status: DC | PRN
  Administered 2023-09-23: 0.2 via TOPICAL
  Filled 2023-09-03: qty 28.35

## 2023-09-03 MED ORDER — TAMSULOSIN HCL 0.4 MG PO CAPS
0.8000 mg | ORAL_CAPSULE | Freq: Every day | ORAL | Status: DC
  Administered 2023-09-04 – 2023-09-16 (×13): 0.8 mg via ORAL
  Filled 2023-09-03 (×14): qty 2

## 2023-09-03 NOTE — Progress Notes (Signed)
 PROGRESS NOTE   Subjective/Complaints:  Vitals stable. No events overnight. Had shower this AM, feeling good, per therapies much more alert/energetic.  ISC for 300 and 700 overnight; did have a incontinent void earlier  ROS: Denies fevers, chills, N/V, abdominal pain, constipation, diarrhea, SOB, cough, chest pain, new weakness or paraesthesias.    Objective:   DG Shoulder Left Result Date: 09/02/2023 CLINICAL DATA:  757125 Left arm weakness [id number] EXAM: LEFT SHOULDER - 2+ VIEW COMPARISON:  None Available. FINDINGS: No acute fracture or dislocation. There is no evidence of arthropathy or other focal bone abnormality. Calcified carotid plaque on the left. IMPRESSION: 1. No acute fracture or dislocation. 2. Calcified carotid plaque on the left. If not previously evaluated, nonemergent carotid ultrasound versus CTA of the neck should be considered for further characterization. These results will be called to the ordering clinician or representative by the Radiologist Assistant and communication documented in the PACS or [employer]. Electronically Signed   By: Rogelia Myers M.D.   On: 09/02/2023 16:01   Recent Labs    09/01/23 1401 09/02/23 0518  WBC 8.0 7.9  HGB 10.0* 9.8*  HCT 31.6* 31.3*  PLT 235 242   Recent Labs    09/01/23 1401 09/02/23 0518  NA  --  138  K  --  4.0  CL  --  102  CO2  --  25  GLUCOSE  --  138*  BUN  --  21  CREATININE 0.94 0.97  CALCIUM   --  9.1    Intake/Output Summary (Last 24 hours) at 09/03/2023 0814 Last data filed at 09/03/2023 0754 Gross per 24 hour  Intake 476 ml  Output 2400 ml  Net -1924 ml        Physical Exam: Vital Signs Blood pressure 107/66, pulse 69, temperature 98.3 F (36.8 C), resp. rate 18, height 5' (1.524 m), weight 52.2 kg, SpO2 96%.  Constitutional: No apparent distress. Appropriate appearance for age.  HENT: No JVD. Neck Supple. Trachea midline. Atraumatic,  normocephalic. Eyes: PERRLA. EOMI. Visual fields grossly intact.  Cardiovascular: RRR, no murmurs/rub/gallops. No Edema. Peripheral pulses 2+  Respiratory: CTAB. No rales, rhonchi, or wheezing. On RA.  Abdomen: + bowel sounds, normoactive. No distention or tenderness.  GU: Not examined. +Foley, draining clear urine.  Skin:  Unstageable buttocks with adherent slough; MASD surrounding. Dressings changed.   MSK:      No apparent deformity. LUE shoulder ROM <50 degrees abduction, flexion.   Neurologic exam:  Cognition: AAO to person, place, time and event.  Language: Fluent, Mild dysarthria.  Memory: mild deficits Insight: Fair insight into current condition.  Mood: Flat affect; denies SI/HI Sensation: Equal and intact in BL UE and Les.  Reflexes: 2+ in BL UE and LEs. Negative Hoffman's and babinski signs bilaterally.  CN: 2-12 grossly intact.  Coordination: BL UE intention tremor.  Spasticity: MAS 0 in all extremities.   Strength: 4/5 BL UE, 2/5 proximal and 4/5 distal BL LE   Physical exam unchanged from the above on reexamination 09/03/23    Assessment/Plan: 1. Functional deficits which require 3+ hours per day of interdisciplinary therapy in a comprehensive inpatient rehab setting. Physiatrist is providing  close team supervision and 24 hour management of active medical problems listed below. Physiatrist and rehab team continue to assess barriers to discharge/monitor patient progress toward functional and medical goals  Care Tool:  Bathing    Body parts bathed by patient: Right arm, Left arm, Chest, Abdomen, Front perineal area, Right upper leg, Left upper leg, Right lower leg, Left lower leg, Face   Body parts bathed by helper: Buttocks     Bathing assist Assist Level: Maximal Assistance - Patient 24 - 49%     Upper Body Dressing/Undressing Upper body dressing   What is the patient wearing?: Pull over shirt    Upper body assist Assist Level: Total Assistance -  Patient < 25%    Lower Body Dressing/Undressing Lower body dressing      What is the patient wearing?: Underwear/pull up, Pants     Lower body assist Assist for lower body dressing: Total Assistance - Patient < 25%     Toileting Toileting    Toileting assist Assist for toileting: Dependent - Patient 0%     Transfers Chair/bed transfer  Transfers assist  Chair/bed transfer activity did not occur: Safety/medical concerns  Chair/bed transfer assist level: Moderate Assistance - Patient 50 - 74%     Locomotion Ambulation   Ambulation assist      Assist level: 2 helpers Assistive device: Walker-rolling Max distance: 15'   Walk 10 feet activity   Assist     Assist level: 2 helpers Assistive device: Walker-rolling   Walk 50 feet activity   Assist Walk 50 feet with 2 turns activity did not occur: Safety/medical concerns         Walk 150 feet activity   Assist Walk 150 feet activity did not occur: Safety/medical concerns         Walk 10 feet on uneven surface  activity   Assist Walk 10 feet on uneven surfaces activity did not occur: Safety/medical concerns         Wheelchair     Assist Is the patient using a wheelchair?: Yes Type of Wheelchair: Manual    Wheelchair assist level: Dependent - Patient 0% Max wheelchair distance: 150'    Wheelchair 50 feet with 2 turns activity    Assist        Assist Level: Dependent - Patient 0%   Wheelchair 150 feet activity     Assist      Assist Level: Dependent - Patient 0%   Blood pressure 107/66, pulse 69, temperature 98.3 F (36.8 C), resp. rate 18, height 5' (1.524 m), weight 52.2 kg, SpO2 96%.  Medical Problem List and Plan: 1. 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 Severe motor planning  and apraxia, impulsive ex. Tried to sit mid-air. Max A overall but can power up with Min A. Will need 24/7 Min A on discharge.   2.  Antithrombotics: -DVT/anticoagulation:  Pharmaceutical: Lovenox              -antiplatelet therapy: Aspirin  81 mg daily  3. Pain Management: Tylenol  as needed  4. Mood/Behavior/Sleep/bipolar disorder II - poorly controlled: BuSpar  15 mg twice daily, Lamictal  titrating to 100 mg daily             -antipsychotic  agents: N/A   - 7/15: Very flat affect and poor initiation per nursing; will need full SPV for meals per nursing. Continue current medication  titration - no mania, SI, HI  - Per psych consult inpatient: Will plan to slowly titrate lamictal  since she has not been taking it for at least 1 month and to prevent risk of side effects such as SJS. Will also plan to start trazodone  at a lower dose compared to her home dose of 400 mg in setting of preventing additional sedation, altered mental status, and since she had been sleeping well while in the hospital. We will defer to outpatient psychiatry for titrating trazodone  if indicated. She can follow-up with her outpatient psychiatric provider at [facility] upon discharge.  - ?resumption on invega 3 mg; will defer to OP   5. Neuropsych/cognition: This patient is not capable of making decisions on her own behalf. 6. Skin/Wound Care: Routine skin checks   - 7/15: Unstageable sacral PI; awaiting WC consult. Safety zone report completed.   Cleanse with Vashe [id number], not rinse, pat dry the peri-wound skin. Apply Xeroform on the wound bed daily. Cover with sacrum foam dressing changing every 3 days or PRN soiling.   7. Fluids/Electrolytes/Nutrition: Routine in and outs with follow-up chemistries   - 7/15: Albumin 2.3; monitor PO intakes, Ensure BID. See #15 below.   8.  CAD/Non-STEMI/acute on chronic diastolic congestive heart failure.  Cardiac catheterization found to have chronic LAD.  Continue aspirin  therapy    - daily weights  Filed Weights   09/02/23 0700 09/03/23 0439 09/03/23 0700  Weight: 52.3 kg 52.9 kg 52.2 kg    9.  Diabetes mellitus with peripheral neuropathy.  Hemoglobin A1c 4.8.  NovoLog  5 units 3 times daily, Semglee  12 units daily             -sugars under fair control at present--continue current regimen Recent Labs    09/02/23 1637 09/02/23 2007 09/03/23 0545  GLUCAP 167* 88 147*     10.  Leukocytosis/UTI.  Antibiotic therapy completed 11.  Hyperammonemia.  Continue Chronulac  10 mg twice daily.  Follow-up ammonia levels             -last ammonia level 20 on 7/9  12.  Hypothyroidism.  Synthroid  13.  Documented CKD stage III.  Latest creatinine 0.95.  Follow-up chemistries.  Follow-up with [facility] nephrology 14.  Chronic anemia.  Follow-up CBC 15.  Decreased nutritional storage.  Diet advanced to regular.  Follow-up dietary services             -intake poor/inconsistent             -engage RD for assistance   16.  COPD/tobacco use/marijuana use.  Continue inhalers as directed.  Check oxygen saturations every shift.  Urine drug screen negative.  Provide counseling   17. Urinary retention. Foley replaced 7/4. Awaiting WC consult as above for recs prior to removal--see above, DC foley trial started 7/15.    - 7/16 2x ISC overnight; 1x incontinent void; increase flomax  to 0.8 mg, start timed toiletting Q4-6 hours  18. Constipation. Small, hard BM this AM. Add Miralax  daily, sennakot s 1 tab BID.   - LBM 7/16, large, with some rectal bleeding--add cream   19. L drop arm. ?RTC. Patient endorses fall 2 months ago with no workup. Will xray.   - 7/16: Xray w/o acute fractuire or dislocation  20. L calcified carotid plaque. CTA vs US  evaluation as OP.  LOS: 2 days A FACE TO FACE EVALUATION WAS PERFORMED  Joesph JAYSON Likes 09/03/2023, 8:14 AM

## 2023-09-03 NOTE — Care Management (Signed)
 Inpatient Rehabilitation Center Individual Statement of Services  Patient Name:  Sarah Davis  Date:  09/03/2023  Welcome to the Inpatient Rehabilitation Center.  Our goal is to provide you with an individualized program based on your diagnosis and situation, designed to meet your specific needs.  With this comprehensive rehabilitation program, you will be expected to participate in at least 3 hours of rehabilitation therapies Monday-Friday, with modified therapy programming on the weekends.  Your rehabilitation program will include the following services:  Physical Therapy (PT), Occupational Therapy (OT), Speech Therapy (ST), 24 hour per day rehabilitation nursing, Therapeutic Recreaction (TR), Psychology, Neuropsychology, Care Coordinator, Rehabilitation Medicine, Nutrition Services, Pharmacy Services, and Other  Weekly team conferences will be held on Tuesday to discuss your progress.  Your Inpatient Rehabilitation Care Coordinator will talk with you frequently to get your input and to update you on team discussions.  Team conferences with you and your family in attendance may also be held.  Expected length of stay: 21-25 days    Overall anticipated outcome: Minimal Assistance  Depending on your progress and recovery, your program may change. Your Inpatient Rehabilitation Care Coordinator will coordinate services and will keep you informed of any changes. Your Inpatient Rehabilitation Care Coordinator's name and contact numbers are listed  below.  The following services may also be recommended but are not provided by the Inpatient Rehabilitation Center:  Driving Evaluations Home Health Rehabiltiation Services Outpatient Rehabilitation Services Vocational Rehabilitation   Arrangements will be made to provide these services after discharge if needed.  Arrangements include referral to agencies that provide these services.  Your insurance has been verified to be:  [employer]  Your  primary doctor is:  No PCP listed  Pertinent information will be shared with your doctor and your insurance company.  Inpatient Rehabilitation Care Coordinator:  Graeme Feliciana SILK [phone] or [phone]  Information discussed with and copy given to patient by: Graeme DELENA Feliciana, 09/03/2023, 9:11 AM

## 2023-09-03 NOTE — Plan of Care (Signed)
  Problem: Consults Goal: RH GENERAL PATIENT EDUCATION Description: See Patient Education module for education specifics. Outcome: Progressing   Problem: RH BOWEL ELIMINATION Goal: RH STG MANAGE BOWEL WITH ASSISTANCE Description: STG Manage Bowel with minimal Assistance. Outcome: Progressing   Problem: RH BLADDER ELIMINATION Goal: RH STG MANAGE BLADDER WITH ASSISTANCE Description: STG Manage Bladder With minimal Assistance Outcome: Progressing   Problem: RH SKIN INTEGRITY Goal: RH STG SKIN FREE OF INFECTION/BREAKDOWN Description: Manage skin free of infection/breakdown with minimal assistance Outcome: Progressing   Problem: RH SAFETY Goal: RH STG ADHERE TO SAFETY PRECAUTIONS W/ASSISTANCE/DEVICE Description: STG Adhere to Safety Precautions With minimal Assistance/Device. Outcome: Progressing   Problem: RH PAIN MANAGEMENT Goal: RH STG PAIN MANAGED AT OR BELOW PT'S PAIN GOAL Description: < 4 w/prns Outcome: Progressing   Problem: RH KNOWLEDGE DEFICIT GENERAL Goal: RH STG INCREASE KNOWLEDGE OF SELF CARE AFTER HOSPITALIZATION Description: Manage increase knowledge of self care after hospitalization with minimal assistance from family using educational materials provided Outcome: Progressing

## 2023-09-03 NOTE — Progress Notes (Signed)
 Speech Language Pathology Daily Session Note  Patient Details  Name: Sarah Davis MRN: [id number] Date of Birth: Jul 14, 1960  Today's Date: 09/03/2023 SLP Individual Time: 1000-1100 SLP Individual Time Calculation (min): 60 min  Short Term Goals: Week 1: SLP Short Term Goal 1 (Week 1): Pt will utilize calendar as needed to verbalize orientation information w/ modA SLP Short Term Goal 2 (Week 1): Pt will utilize external memory aids as needed to recall recent information w/ 50% accuracy given maxA SLP Short Term Goal 3 (Week 1): Pt will complete functional expression tasks w/ modA SLP Short Term Goal 4 (Week 1): Pt will sustain attention for 10 mins w/ maxA SLP Short Term Goal 5 (Week 1): Pt will improve initiation during functional and structured tasks to maxA SLP Short Term Goal 6 (Week 1): Pt will solve simple problems during functional and structured cognitive tasks w/ maxA  Skilled Therapeutic Interventions:   Pt greeted in her room. She was awake in her wheelchair upon SLP arrival. Flat affect remains. She required intermittent cues for motivation, however, was overall cooperative throughout tx tasks targeting cognition and language. SLP provided pt w/ calendar to assist w/ orientation given continued deficits w/ orientation to time. She was, however, able to recall (partial) reason for stay this date. She then benefited from modA cues to utilize the calendar and ID current date. After, SLP facilitated card sorting task fo 4 targeting attention, organization, and problem solving. She benefited from modA. She was able to sustain attention for ~3 mins at a time throughout task, benefiting from [facility] to regain attention. After card sorting task, SLP facilitated generative naming task w/ verbal and written responses. She benefited from maxA overall d/t perseverations (I.e. balckberry, redberry, greenberry for fruits) and phonemic paraphasias. She also benefited from modA cues to maintain 80% legibility  d/t spatial deficits. Throughout naming task, she required frequent cues to no tip her wheelchair back and not slide down in chair. After multiple repositionings, SLP assisted pt back to bed. During stand pivot transfer, she benefited from modA physical assistance and maxA cues for sequencing/problem solving. Of note, other than attempting to transfer before SLP was ready, pt remains dependent for initiation. Once in bed, she was positioned on her R side, left with the alarm set, and call light within reach. Recommend cont ST per POC.   Pain  No pain reported  Therapy/Group: Individual Therapy  Recardo DELENA Mole 09/03/2023, 10:39 AM

## 2023-09-03 NOTE — Progress Notes (Signed)
 Occupational Therapy Session Note  Patient Details  Name: Sarah Davis MRN: [id number] Date of Birth: 25-Feb-1960  Today's Date: 09/03/2023 OT Individual Time: 9179-9068 OT Individual Time Calculation (min): 71 min    Short Term Goals: Week 1:  OT Short Term Goal 1 (Week 1): Pt will complete UB dressing with mod A and no more than mod cueing OT Short Term Goal 2 (Week 1): Pt will orient her shirt correctly with min cueing OT Short Term Goal 3 (Week 1): Pt will complete stand pivot to the [facility] with min A OT Short Term Goal 4 (Week 1): Pt will complete toileting hygiene with mod A  Skilled Therapeutic Interventions/Progress Updates:    Pt received supine with no c/o pain, agreeable to OT session. She was more alert today and had improved task initiation and attention compared to yesterday's session. She came to EOB with only (S) using bed rail! Stand pivot with B HHA with min A to the w/c. Brought w/c into bathroom and completed a stand pivot to the [facility] in the shower with min A. She was able to demo awareness of needing to use the bathroom. She sat for several minutes and with min cueing for attention to bearing down for BM had an extremely large BM and voided urine. She completed UB bathing with mod cueing for initiation and thoroughness and min A overall. Mod A to wash peri areas seated. She frequent assumed a posterior pelvic tilt and very reclined posture that required manual and verbal cueing to correct to safely remain seated. She completed Stand pivot back to her w/c with mod A. She donned a bra and shirt with improved motor planning today- min A overall. DO arrived and inspected sacral wound while pt was in standing. She required mod A to don pants. She was taken via w/c to the supply room to change w/c cushion to a ROHO to maximize pressure relief/distribution and promote wound healing. She returned to her room. Pt was left sitting up in the wheelchair with all needs met, chair alarm set, and call  bell within reach.   Therapy Documentation Precautions:  Restrictions Weight Bearing Restrictions Per Provider Order: No   Therapy/Group: Individual Therapy  [name] 09/03/2023, 9:42 AM

## 2023-09-03 NOTE — Progress Notes (Signed)
 Patient ID: Sarah Davis, female   DOB: 05-12-1960, 64 y.o.   MRN: [id number]  0922-SW spoke with pt mother [name] to inform on ELOS. SW shared will provide updates after team conference.   Graeme Jude, MSW, LCSW Office: [phone] Cell: [phone] Fax: [phone]

## 2023-09-03 NOTE — Progress Notes (Signed)
 Skin tear noted to labia. Notified Daphne, NP. New orders placed.   Geni Armor, LPN

## 2023-09-03 NOTE — Progress Notes (Signed)
 Physical Therapy Session Note  Patient Details  Name: Sarah Davis Name MRN: [id number] Date of Birth: May 10, 1960  Today's Date: 09/03/2023 PT Individual Time: 1420-1530 PT Individual Time Calculation (min): 70 min   Short Term Goals: Week 1:  PT Short Term Goal 1 (Week 1): Pt will complete bed moblility with minA consistently. PT Short Term Goal 2 (Week 1): Pt will complete sit to stand with minA consistently. PT Short Term Goal 3 (Week 1): Pt will complete bed to chair with minA consistently. PT Short Term Goal 4 (Week 1): Pt will ambulate x50' with modA and LRAD.  Skilled Therapeutic Interventions/Progress Updates:     Pt received supine in bed and agrees to therapy. No complaint of pain. Supine to sit with bed features. Pt performs stand step transfer from bed to chair with modA and no AD, with PT providing manual facilitation to prevent posterior LOB, and pt requiring cues for safety and increased eccentric control of stand to sit. WC transport to gym. Pt stands with modA and RW, with cues for initiation, and hand placement. Pt ambulates x8' with RW and modA, with decreased safety awareness as pt approaches WC to sit down, requiring increased cueing for positioning and hand placement. Pt takes extended seated rest break. Pt gradually increases gait distance with each bout, attempting to increase endurance as well as overall safety with mobility and confidence with ambulation. Pt ambulates x13', x25', and x40' with extended seated rest breaks between each bout.  Pt performs stand step to Nustep with cues for positioning. Pt completes x5:00 at workload of 3 with average steps per minute ~35. Performed for endurance training and reciprocal coordination. PT provides cues for hand and foot placement and completing full available ROM.   Pt then performs standing for endurance training, tasked with standing as long as possible with RW for upper extremity support. Pt able to stand 1:00, 1:20, and 2:00 with  extended seated rest breaks between bouts. Pt then ambulates x45' with modA and +2 WC follow for safety. WC transport back to room. Stand step to bed with modA and no AD. Left supine with alarm intact and all needs within reach.   Therapy Documentation Precautions:  Restrictions Weight Bearing Restrictions Per Provider Order: No   Therapy/Group: Individual Therapy  [name], PT, DPT 09/03/2023, 4:10 PM

## 2023-09-04 LAB — GLUCOSE, CAPILLARY
Glucose-Capillary: 152 mg/dL — ABNORMAL HIGH (ref 70–99)
Glucose-Capillary: 151 mg/dL — ABNORMAL HIGH (ref 70–99)
Glucose-Capillary: 135 mg/dL — ABNORMAL HIGH (ref 70–99)
Glucose-Capillary: 155 mg/dL — ABNORMAL HIGH (ref 70–99)

## 2023-09-04 LAB — URINALYSIS, ROUTINE W REFLEX MICROSCOPIC
Bilirubin Urine: NEGATIVE
Glucose, UA: NEGATIVE mg/dL
Hgb urine dipstick: NEGATIVE
Ketones, ur: NEGATIVE mg/dL
Nitrite: NEGATIVE
Protein, ur: NEGATIVE mg/dL
Specific Gravity, Urine: 1.006 (ref 1.005–1.030)
WBC, UA: >50 WBC/hpf (ref 0–5)
pH: 7 (ref 5.0–8.0)

## 2023-09-04 MED ORDER — BETHANECHOL CHLORIDE 10 MG PO TABS
10.0000 mg | ORAL_TABLET | Freq: Three times a day (TID) | ORAL | Status: DC
  Administered 2023-09-04 – 2023-09-10 (×18): 10 mg via ORAL
  Filled 2023-09-04 (×19): qty 1

## 2023-09-04 NOTE — Progress Notes (Signed)
 Speech Language Pathology Daily Session Note  Patient Details  Name: Sarah Davis MRN: [id number] Date of Birth: 02-12-1961  Today's Date: 09/04/2023 SLP Individual Time: 9199-9154 SLP Individual Time Calculation (min): 45 min  Short Term Goals: Week 1: SLP Short Term Goal 1 (Week 1): Pt will utilize calendar as needed to verbalize orientation information w/ modA SLP Short Term Goal 2 (Week 1): Pt will utilize external memory aids as needed to recall recent information w/ 50% accuracy given maxA SLP Short Term Goal 3 (Week 1): Pt will complete functional expression tasks w/ modA SLP Short Term Goal 4 (Week 1): Pt will sustain attention for 10 mins w/ maxA SLP Short Term Goal 5 (Week 1): Pt will improve initiation during functional and structured tasks to maxA SLP Short Term Goal 6 (Week 1): Pt will solve simple problems during functional and structured cognitive tasks w/ maxA  Skilled Therapeutic Interventions:   Pt greeted at bedside. She was asleep upon SLP arrival, and required modA cues to wake for tx tasks targeting cognition/language. She spontaneously recalled the month and year, but required visual aid (calendar) and maxA cues to recall specific date and dow. She also spontaneously recalled her location. She then completed mildly specific responsive naming task. Anticipate fatigue and subsequently reduced attention/alertness negatively impacted success, as she required maxA throughout task. Pt was provided w/ cognitive rest break x2, though did not appear to assist w/ response time/attention once tasks resumed. She benefited from totalA for initiation as well. At the end of tx tasks, she was left in her bed with the alarm set and call light within reach. Recommend cont ST per POC.   Pain  No pain reported  Therapy/Group: Individual Therapy  Recardo DELENA Mole 09/04/2023, 8:10 AM

## 2023-09-04 NOTE — Progress Notes (Signed)
 Occupational Therapy Session Note  Patient Details  Name: Sarah Davis MRN: [id number] Date of Birth: 10-06-1960  Today's Date: 09/04/2023 OT Individual Time: 1400-1500 OT Individual Time Calculation (min): 60 min    Short Term Goals: Week 1:  OT Short Term Goal 1 (Week 1): Pt will complete UB dressing with mod A and no more than mod cueing OT Short Term Goal 2 (Week 1): Pt will orient her shirt correctly with min cueing OT Short Term Goal 3 (Week 1): Pt will complete stand pivot to the [facility] with min A OT Short Term Goal 4 (Week 1): Pt will complete toileting hygiene with mod A  Skilled Therapeutic Interventions/Progress Updates:    1:1 Pt received in the bed. Self care retraining at shower level . Pt able to performed bed mobility with min A to come to EOB. Ambulated to the bathroom with min A with RW with A for steering the RW. Pt required max cues for sequencing of bathing all parts she could reach. Pt incontinent of stool in shower (small amt) - pt unaware. Pt ambulated back out of the bathroom to the w/c at the sink. Pt able to thread brief as underwear and required A to pull up. Sit to stand in session with min A with extra time to maintain balance in standing. Pt unable to brush through all of hair despite Assistance.  Engaged in [facility] task with visual attention to right and left with min cues. Pt picking up proper colored thumb tacks and pushing them into matching color place on picture with min A. Pt fatigues quickly from activity due to required attention to task and effort to use fingers to push in tack.  Pt returned to room and performed stand pivot back into bed with min A. Pt returned to supine with min A for positioning in the bed.   Therapy Documentation Precautions:  Restrictions Weight Bearing Restrictions Per Provider Order: No  Pain: No c/o pain   Therapy/Group: Individual Therapy  [name] Nest [facility] 09/04/2023, 3:49 PM

## 2023-09-04 NOTE — Progress Notes (Signed)
 PROGRESS NOTE   Subjective/Complaints: No acute complaints.  No events overnight.  Patient's mom at bedside, no questions or concerns. Vitals stable.  Contnues ISC large volume for all voids; patient denies feeling urge to void or ability to empty her bladder. LBM large, yesterday, incontinent  BG relatively well controlled; PO intakes variable 0-75% meals.   ROS: Denies fevers, chills, N/V, abdominal pain, constipation, diarrhea, SOB, cough, chest pain, new weakness or paraesthesias.    Objective:   DG Shoulder Left Result Date: 09/02/2023 CLINICAL DATA:  [id number] Left arm weakness [id number] EXAM: LEFT SHOULDER - 2+ VIEW COMPARISON:  None Available. FINDINGS: No acute fracture or dislocation. There is no evidence of arthropathy or other focal bone abnormality. Calcified carotid plaque on the left. IMPRESSION: 1. No acute fracture or dislocation. 2. Calcified carotid plaque on the left. If not previously evaluated, nonemergent carotid ultrasound versus CTA of the neck should be considered for further characterization. These results will be called to the ordering clinician or representative by the Radiologist Assistant and communication documented in the PACS or [employer]. Electronically Signed   By: Rogelia Myers M.D.   On: 09/02/2023 16:01   Recent Labs    09/01/23 1401 09/02/23 0518  WBC 8.0 7.9  HGB 10.0* 9.8*  HCT 31.6* 31.3*  PLT 235 242   Recent Labs    09/01/23 1401 09/02/23 0518  NA  --  138  K  --  4.0  CL  --  102  CO2  --  25  GLUCOSE  --  138*  BUN  --  21  CREATININE 0.94 0.97  CALCIUM   --  9.1    Intake/Output Summary (Last 24 hours) at 09/04/2023 1010 Last data filed at 09/04/2023 0700 Gross per 24 hour  Intake 837 ml  Output 2300 ml  Net -1463 ml        Physical Exam: Vital Signs Blood pressure 126/82, pulse 68, temperature 98.2 F (36.8 C), temperature source Oral, resp. rate 18, height  5' (1.524 m), weight 52 kg, SpO2 97%.  Constitutional: No apparent distress. Appropriate appearance for age.  Laying in bed. HENT: No JVD. Neck Supple. Trachea midline. Atraumatic, normocephalic. Eyes: PERRLA. EOMI. Visual fields grossly intact.  Cardiovascular: RRR, no murmurs/rub/gallops. No Edema. Peripheral pulses 2+  Respiratory: CTAB. No rales, rhonchi, or wheezing. On RA.  Abdomen: + bowel sounds, normoactive. No distention or tenderness.  Skin:  Unstageable buttocks with adherent slough; MASD surrounding. Dressings changed.   MSK:      No apparent deformity. LUE shoulder ROM <50 degrees abduction, flexion.   Neurologic exam:  Cognition: Oriented to place and time; says she is her mom. Language: Fluent, Mild dysarthria.  Memory: Mild to moderate deficits. Insight: Poor insight into current condition.  Mood: Flat affect; denies SI/HI Sensation: Equal and intact in BL UE and Les.  Reflexes: 2+ in BL UE and LEs. Negative Hoffman's and babinski signs bilaterally.  CN: 2-12 grossly intact.  Coordination: BL UE intention tremor.  Spasticity: MAS 0 in all extremities.   Strength: 4/5 BL UE, 2/5 proximal and 4/5 distal BL LE   Physical exam unchanged from the above on reexamination 09/04/23  Assessment/Plan: 1. Functional deficits which require 3+ hours per day of interdisciplinary therapy in a comprehensive inpatient rehab setting. Physiatrist is providing close team supervision and 24 hour management of active medical problems listed below. Physiatrist and rehab team continue to assess barriers to discharge/monitor patient progress toward functional and medical goals  Care Tool:  Bathing    Body parts bathed by patient: Right arm, Left arm, Chest, Abdomen, Front perineal area, Right upper leg, Left upper leg, Right lower leg, Left lower leg, Face   Body parts bathed by helper: Buttocks     Bathing assist Assist Level: Maximal Assistance - Patient 24 - 49%     Upper  Body Dressing/Undressing Upper body dressing   What is the patient wearing?: Pull over shirt    Upper body assist Assist Level: Total Assistance - Patient < 25%    Lower Body Dressing/Undressing Lower body dressing      What is the patient wearing?: Underwear/pull up, Pants     Lower body assist Assist for lower body dressing: Total Assistance - Patient < 25%     Toileting Toileting    Toileting assist Assist for toileting: Dependent - Patient 0%     Transfers Chair/bed transfer  Transfers assist  Chair/bed transfer activity did not occur: Safety/medical concerns  Chair/bed transfer assist level: Moderate Assistance - Patient 50 - 74%     Locomotion Ambulation   Ambulation assist      Assist level: 2 helpers Assistive device: Walker-rolling Max distance: 15'   Walk 10 feet activity   Assist     Assist level: 2 helpers Assistive device: Walker-rolling   Walk 50 feet activity   Assist Walk 50 feet with 2 turns activity did not occur: Safety/medical concerns         Walk 150 feet activity   Assist Walk 150 feet activity did not occur: Safety/medical concerns         Walk 10 feet on uneven surface  activity   Assist Walk 10 feet on uneven surfaces activity did not occur: Safety/medical concerns         Wheelchair     Assist Is the patient using a wheelchair?: Yes Type of Wheelchair: Manual    Wheelchair assist level: Dependent - Patient 0% Max wheelchair distance: 150'    Wheelchair 50 feet with 2 turns activity    Assist        Assist Level: Dependent - Patient 0%   Wheelchair 150 feet activity     Assist      Assist Level: Dependent - Patient 0%   Blood pressure 126/82, pulse 68, temperature 98.2 F (36.8 C), temperature source Oral, resp. rate 18, height 5' (1.524 m), weight 52 kg, SpO2 97%.  Medical Problem List and Plan: 1. 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 Severe motor planning and apraxia, impulsive ex. Tried to sit mid-air. Max A overall but can power up with Min A. Will need 24/7 Min A on discharge.   2.  Antithrombotics: -DVT/anticoagulation:  Pharmaceutical: Lovenox              -antiplatelet therapy: Aspirin  81 mg daily  3. Pain Management: Tylenol  as needed  4. Mood/Behavior/Sleep/bipolar disorder II - poorly  controlled: BuSpar  15 mg twice daily, Lamictal  titrating to 100 mg daily             -antipsychotic agents: N/A   - 7/15: Very flat affect and poor initiation per nursing; will need full SPV for meals per nursing. Continue current medication  titration - no mania, SI, HI  - Per psych consult inpatient: Will plan to slowly titrate lamictal  since she has not been taking it for at least 1 month and to prevent risk of side effects such as SJS. Will also plan to start trazodone  at a lower dose compared to her home dose of 400 mg in setting of preventing additional sedation, altered mental status, and since she had been sleeping well while in the hospital. We will defer to outpatient psychiatry for titrating trazodone  if indicated. She can follow-up with her outpatient psychiatric provider at [facility] upon discharge.  - ?resumption on invega 3 mg; will defer to OP   5. Neuropsych/cognition: This patient is not capable of making decisions on her own behalf.   - 7/17: Take off of telesitter; no impulsive behaviors noted  6. Skin/Wound Care: Routine skin checks   - 7/15: Unstageable sacral PI; awaiting WC consult. Safety zone report completed.   Cleanse with Vashe [id number], not rinse, pat dry the peri-wound skin. Apply Xeroform on the wound bed daily. Cover with sacrum foam dressing changing every 3 days or PRN soiling.   7. Fluids/Electrolytes/Nutrition: Routine in and outs with follow-up  chemistries   - 7/15: Albumin 2.3; monitor PO intakes, Ensure BID. See #15 below.   8.  CAD/Non-STEMI/acute on chronic diastolic congestive heart failure.  Cardiac catheterization found to have chronic LAD.  Continue aspirin  therapy   - daily weights --stable Filed Weights   09/03/23 0439 09/03/23 0700 09/04/23 0544  Weight: 52.9 kg 52.2 kg 52 kg    9.  Diabetes mellitus with peripheral neuropathy.  Hemoglobin A1c 4.8.  NovoLog  5 units 3 times daily, Semglee  12 units daily             -sugars under fair control at present--continue current regimen Recent Labs    09/03/23 1624 09/03/23 2143 09/04/23 0600  GLUCAP 168* 144* 152*     10.  Leukocytosis/UTI.  Antibiotic therapy completed 11.  Hyperammonemia.  Continue Chronulac  10 mg twice daily.  Follow-up ammonia levels             -last ammonia level 20 on 7/9  12.  Hypothyroidism.  Synthroid  13.  Documented CKD stage III.  Latest creatinine 0.95.  Follow-up chemistries.  Follow-up with [facility] nephrology 14.  Chronic anemia.  Follow-up CBC. Stable 15.  Decreased nutritional storage.  Diet advanced to regular.  Follow-up dietary services             -intake poor/inconsistent             -engage RD for assistance   16.  COPD/tobacco use/marijuana use.  Continue inhalers as directed.  Check oxygen saturations every shift.  Urine drug screen negative.  Provide counseling   17. Urinary retention. Foley replaced 7/4. Awaiting WC consult as above for recs prior to removal--see above, DC foley trial started 7/15.    - 7/16 2x ISC overnight; 1x incontinent void; increase flomax  to 0.8 mg, start timed toiletting Q4-6 hours  - 7/17: Start bethenachol 10 mg TID for stimulation; get urinalysis  18. Constipation. Small, hard BM this AM. Add Miralax  daily, sennakot s 1 tab BID.   -  LBM 7/16, large, with some rectal bleeding--add cream   19. L drop arm. ?RTC. Patient endorses fall 2 months ago with no workup. Will xray.   - 7/16: Xray w/o  acute fractuire or dislocation  20. L calcified carotid plaque. CTA vs US  evaluation as OP.   LOS: 3 days A FACE TO FACE EVALUATION WAS PERFORMED  Joesph JAYSON Likes 09/04/2023, 10:10 AM

## 2023-09-04 NOTE — Progress Notes (Signed)
 Physical Therapy Session Note  Patient Details  Name: Sarah Davis MRN: [id number] Date of Birth: May 29, 1960  Today's Date: 09/04/2023 PT Individual Time: 0903-1015 PT Individual Time Calculation (min): 72 min  and Today's Date: 09/04/2023 PT Missed Time: 30 Minutes Missed Time Reason: Patient unwilling to participate  Short Term Goals: Week 1:  PT Short Term Goal 1 (Week 1): Pt will complete bed moblility with minA consistently. PT Short Term Goal 2 (Week 1): Pt will complete sit to stand with minA consistently. PT Short Term Goal 3 (Week 1): Pt will complete bed to chair with minA consistently. PT Short Term Goal 4 (Week 1): Pt will ambulate x50' with modA and LRAD.  Skilled Therapeutic Interventions/Progress Updates:     Pt received supine in bed and agrees to therapy. No complaint of pain. Supine to sit with cues for positioning at EOB for safety, with minA provided due to pt's hips being too close to edge of bed. Pt performs stand step transfer to [facility] with modA and cues for sequencing and positioning. WC transport to gym. Session emphasis is gait training to improve endurance and safety with AD management, as well as endurance and balance training. Pt ambulates progressively longer distances during session. On initial bout, pt ambulates x25', requiring modA for sit to stand, minA for stability during straight forward ambulation, and maxA to prevent fall when pt approaches WC due to apraxia and poor AD management, attempting to sit over the top of RW rather than turning to sit in WC. PT demonstrates safe AD management following bout and pt able to improve safety with subsequent attempts. Pt ambulates x30', x30' x50' and x60' with minA/modA overall and cues to maintain proximity to RW for safety, and max cues for safe sequencing of transition back to WC. Extended seated rest break between each bout. WC transport back to room. Stand step to bed with modA and cues for positioning. Left supine with all  needs within reach.   2nd Session: Pt refuses therapy at this time. PT encourages participation but pt says she does not feel like it. PT will follow up as able.   Therapy Documentation Precautions:  Restrictions Weight Bearing Restrictions Per Provider Order: No   Therapy/Group: Individual Therapy  [name], PT, DPT 09/04/2023, 4:14 PM

## 2023-09-05 ENCOUNTER — Telehealth (HOSPITAL_COMMUNITY): Payer: Self-pay

## 2023-09-05 LAB — URINALYSIS, W/ REFLEX TO CULTURE (INFECTION SUSPECTED)
Bilirubin Urine: NEGATIVE
Glucose, UA: NEGATIVE mg/dL
Hgb urine dipstick: NEGATIVE
Ketones, ur: 5 mg/dL — AB
Nitrite: NEGATIVE
Protein, ur: NEGATIVE mg/dL
Specific Gravity, Urine: 1.009 (ref 1.005–1.030)
pH: 6 (ref 5.0–8.0)

## 2023-09-05 LAB — GLUCOSE, CAPILLARY
Glucose-Capillary: 138 mg/dL — ABNORMAL HIGH (ref 70–99)
Glucose-Capillary: 87 mg/dL (ref 70–99)
Glucose-Capillary: 153 mg/dL — ABNORMAL HIGH (ref 70–99)
Glucose-Capillary: 68 mg/dL — ABNORMAL LOW (ref 70–99)
Glucose-Capillary: 121 mg/dL — ABNORMAL HIGH (ref 70–99)

## 2023-09-05 MED ORDER — CEPHALEXIN 250 MG PO CAPS
500.0000 mg | ORAL_CAPSULE | Freq: Two times a day (BID) | ORAL | Status: DC
  Administered 2023-09-05 – 2023-09-07 (×5): 500 mg via ORAL
  Filled 2023-09-05 (×5): qty 2

## 2023-09-05 MED ORDER — FERROUS SULFATE 325 (65 FE) MG PO TABS
325.0000 mg | ORAL_TABLET | Freq: Two times a day (BID) | ORAL | Status: DC
  Administered 2023-09-05 – 2023-09-23 (×36): 325 mg via ORAL
  Filled 2023-09-05 (×39): qty 1

## 2023-09-05 NOTE — Progress Notes (Signed)
 PROGRESS NOTE   Subjective/Complaints: No acute complaints.  No events overnight.   Vital stable. Still requiring straight cath for all voids.  Greater than 50 WBCs found on urinalysis, with large leukocyte esterase and rare bacteria.  No nitrates.  ROS: Denies fevers, chills, N/V, abdominal pain, constipation, diarrhea, SOB, cough, chest pain, new weakness or paraesthesias.    Objective:   No results found.  No results for input(s): WBC, HGB, HCT, PLT in the last 72 hours.  No results for input(s): NA, K, CL, CO2, GLUCOSE, BUN, CREATININE, CALCIUM  in the last 72 hours.   Intake/Output Summary (Last 24 hours) at 09/05/2023 0758 Last data filed at 09/05/2023 0600 Gross per 24 hour  Intake --  Output 1850 ml  Net -1850 ml        Physical Exam: Vital Signs Blood pressure (!) 117/56, pulse 71, temperature 98.1 F (36.7 C), resp. rate 17, height 5' (1.524 m), weight 51.6 kg, SpO2 100%.  Constitutional: No apparent distress. Appropriate appearance for age.  Laying in bed. HENT: No JVD. Neck Supple. Trachea midline. Atraumatic, normocephalic. Eyes: PERRLA. EOMI. Visual fields grossly intact.  Cardiovascular: RRR, no murmurs/rub/gallops. No Edema. Peripheral pulses 2+  Respiratory: CTAB. No rales, rhonchi, or wheezing. On RA.  Abdomen: + bowel sounds, normoactive. No distention or tenderness.  Skin:  Unstageable buttocks with adherent slough; MASD surrounding--not examined today   MSK:      No apparent deformity. LUE shoulder ROM <50 degrees abduction, flexion.   Neurologic exam:  Cognition: Oriented to place and time; says she is her mom. Language: Fluent, Mild dysarthria.  Memory: Mild to moderate deficits. Insight: Poor insight into current condition.  Mood: Flat affect Sensation: Equal and intact in BL UE and Les.  Reflexes: 2+ in BL UE and LEs. CN: 2-12 grossly intact.  Coordination: BL  UE intention tremor.  Spasticity: MAS 0 in all extremities.   Strength: 4/5 BL UE 3/5 proximal and 4/5 distal BL LE   Physical exam unchanged from the above on reexamination 09/05/23    Assessment/Plan: 1. Functional deficits which require 3+ hours per day of interdisciplinary therapy in a comprehensive inpatient rehab setting. Physiatrist is providing close team supervision and 24 hour management of active medical problems listed below. Physiatrist and rehab team continue to assess barriers to discharge/monitor patient progress toward functional and medical goals  Care Tool:  Bathing    Body parts bathed by patient: Right arm, Left arm, Chest, Abdomen, Front perineal area, Right upper leg, Left upper leg, Face   Body parts bathed by helper: Buttocks     Bathing assist Assist Level: Moderate Assistance - Patient 50 - 74%     Upper Body Dressing/Undressing Upper body dressing   What is the patient wearing?: Pull over shirt    Upper body assist Assist Level: Moderate Assistance - Patient 50 - 74%    Lower Body Dressing/Undressing Lower body dressing      What is the patient wearing?: Incontinence brief     Lower body assist Assist for lower body dressing: Moderate Assistance - Patient 50 - 74%     Toileting Toileting    Toileting assist Assist for toileting:  Maximal Assistance - Patient 25 - 49%     Transfers Chair/bed transfer  Transfers assist  Chair/bed transfer activity did not occur: Safety/medical concerns  Chair/bed transfer assist level: Moderate Assistance - Patient 50 - 74%     Locomotion Ambulation   Ambulation assist      Assist level: Moderate Assistance - Patient 50 - 74% Assistive device: Walker-rolling Max distance: 60'   Walk 10 feet activity   Assist     Assist level: Moderate Assistance - Patient - 50 - 74% Assistive device: Walker-rolling   Walk 50 feet activity   Assist Walk 50 feet with 2 turns activity did not  occur: Safety/medical concerns  Assist level: Moderate Assistance - Patient - 50 - 74% Assistive device: Walker-rolling    Walk 150 feet activity   Assist Walk 150 feet activity did not occur: Safety/medical concerns         Walk 10 feet on uneven surface  activity   Assist Walk 10 feet on uneven surfaces activity did not occur: Safety/medical concerns         Wheelchair     Assist Is the patient using a wheelchair?: Yes Type of Wheelchair: Manual    Wheelchair assist level: Dependent - Patient 0% Max wheelchair distance: 150'    Wheelchair 50 feet with 2 turns activity    Assist        Assist Level: Dependent - Patient 0%   Wheelchair 150 feet activity     Assist      Assist Level: Dependent - Patient 0%   Blood pressure (!) 117/56, pulse 71, temperature 98.1 F (36.7 C), resp. rate 17, height 5' (1.524 m), weight 51.6 kg, SpO2 100%.  Medical Problem List and Plan: 1. 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 Severe motor planning and apraxia, impulsive ex. Tried to sit mid-air. Max A overall but can power up with Min A. Will need 24/7 Min A on discharge.   2.  Antithrombotics: -DVT/anticoagulation:  Pharmaceutical: Lovenox              -antiplatelet therapy: Aspirin  81 mg daily  3. Pain Management: Tylenol  as needed  4. Mood/Behavior/Sleep/bipolar disorder II - poorly controlled: BuSpar  15 mg twice daily, Lamictal  titrating to 100 mg daily             -antipsychotic agents: N/A   - 7/15: Very flat affect and poor initiation per nursing; will need full SPV for meals per nursing. Continue current medication  titration - no mania, SI, HI  - Per psych consult inpatient: Will plan to slowly titrate lamictal  since she has not been taking it for at least 1 month and to  prevent risk of side effects such as SJS. Will also plan to start trazodone  at a lower dose compared to her home dose of 400 mg in setting of preventing additional sedation, altered mental status, and since she had been sleeping well while in the hospital. We will defer to outpatient psychiatry for titrating trazodone  if indicated. She can follow-up with her outpatient psychiatric provider at [facility] upon discharge.  - ?resumption on invega 3 mg; will defer to OP   5. Neuropsych/cognition: This patient is not capable of making decisions on  her own behalf.  6. Skin/Wound Care: Routine skin checks   - 7/15: Unstageable sacral PI; awaiting WC consult. Safety zone report completed.   Cleanse with Vashe [id number], not rinse, pat dry the peri-wound skin. Apply Xeroform on the wound bed daily. Cover with sacrum foam dressing changing every 3 days or PRN soiling.   7. Fluids/Electrolytes/Nutrition: Routine in and outs with follow-up chemistries   - 7/15: Albumin 2.3; monitor PO intakes, Ensure BID. See #15 below.   8.  CAD/Non-STEMI/acute on chronic diastolic congestive heart failure.  Cardiac catheterization found to have chronic LAD.  Continue aspirin  therapy   - daily weights --stable Filed Weights   09/03/23 0700 09/04/23 0544 09/05/23 0508  Weight: 52.2 kg 52 kg 51.6 kg    9.  Diabetes mellitus with peripheral neuropathy.  Hemoglobin A1c 4.8.  NovoLog  5 units 3 times daily, Semglee  12 units daily             -sugars under fair control at present--continue current regimen Recent Labs    09/04/23 1717 09/04/23 2204 09/05/23 0613  GLUCAP 135* 155* 138*     10.  Leukocytosis/UTI.  Antibiotic therapy completed 11.  Hyperammonemia.  Continue Chronulac  10 mg twice daily.  Follow-up ammonia levels             -last ammonia level 20 on 7/9  12.  Hypothyroidism.  Synthroid  13.  Documented CKD stage III.  Latest creatinine 0.95.  Follow-up chemistries.  Follow-up with [facility]  nephrology  - BUN/creatinine have remained stable within her normal range  14.  Chronic anemia.  Follow-up CBC. Stable 9-10.  Add iron supplement. 15.  Decreased nutritional storage.  Diet advanced to regular.  Follow-up dietary services             -intake poor/inconsistent             -engage RD for assistance   16.  COPD/tobacco use/marijuana use.  Continue inhalers as directed.  Check oxygen saturations every shift.  Urine drug screen negative.  Provide counseling   17. Urinary retention. Foley replaced 7/4. Awaiting WC consult as above for recs prior to removal--see above, DC foley trial started 7/15.    - 7/16 2x ISC overnight; 1x incontinent void; increase flomax  to 0.8 mg, start timed toiletting Q4-6 hours  - 7/17: Start bethenachol 10 mg TID for stimulation; get urinalysis  - 7-18: No improvement in urinary retention; urinalysis borderline but will opt to treat in light of symptoms.  Start Keflex 500 mg twice daily for 7 days.  Add on urine culture--follow-up on results  18. Constipation. Small, hard BM this AM. Add Miralax  daily, sennakot s 1 tab BID.   - LBM 7/16, large, with some rectal bleeding--add cream   19. L drop arm. ?RTC. Patient endorses fall 2 months ago with no workup. Will xray.   - 7/16: Xray w/o acute fractuire or dislocation  20. L calcified carotid plaque. CTA vs US  evaluation as OP.   LOS: 4 days A FACE TO FACE EVALUATION WAS PERFORMED  Joesph JAYSON Likes 09/05/2023, 7:58 AM

## 2023-09-05 NOTE — Telephone Encounter (Signed)
 Faxed outside referral for Phase II Cardiac Rehab to [place].

## 2023-09-05 NOTE — Progress Notes (Addendum)
 Physical Therapy Session Note  Patient Details  Name: Sarah Davis MRN: [id number] Date of Birth: [birth year]  Today's Date: 09/05/2023 PT Individual Time: 9199-9154 and 8495-8469 PT Individual Time Calculation (min): 45 min and 26 min.  Short Term Goals: Week 1:  PT Short Term Goal 1 (Week 1): Pt will complete bed moblility with minA consistently. PT Short Term Goal 2 (Week 1): Pt will complete sit to stand with minA consistently. PT Short Term Goal 3 (Week 1): Pt will complete bed to chair with minA consistently. PT Short Term Goal 4 (Week 1): Pt will ambulate x50' with modA and LRAD.  Skilled Therapeutic Interventions/Progress Updates:   First session:Pt presents supine in bed and agreeable to therapy.  PT threaded pants over feet and then pt transferring sup to sit before completing, but only supervision physically although quick movements performed..  Pt transfers sit to stand w/ mod A and then transferred to w/c w/ same and verbal cues for safety.  BP monitored at 104/67, HR 104 and minimal c/o lightheadedness.  Pt wheeled to dayroom and pt transfers sit to stand w/ mod A and amb 35' x 2.  Pt states dizziness and monitored at 88/60, HR 108.  Nursing notified.  Pt returned to room and remained sitting in w/c w/ all needs in reach.  Second session:  Pt presents semi-reclined in bed and initially refusing therapy don't feel like it.  Encouraged by PT and mother to participate.  Pt transfers sup to sit w/ CGA 2/2 quick movements and air mattress.  Pt donned 1 slipper sock in [place] position.  Pt transfers sit to stand w/ min A and verbal/visual cues for hand placement.  Pt amb x 65' including turn to return to sitting in w/c, pt stating I need to sit down but able to continue w/ encouragement.  BP monitored at 116/64, HR 98 after gait.  Pt amb x 65' into dayroom w/ RW and min A, occasional manual assist for managing RW around obstacles.  Pt amb back to room and to bed.  Pt required A for safety  w/ approach and not reaching for bed for controlled eccentric stand to sit.  Pt doffed socks in sitting.  Pt transfers sit to supine w/ supervision, quick movements.  Pt able to scoot to [facility].  Pt remained in semi-reclined position w/all needs in reach, mother present.     Therapy Documentation Precautions:  Restrictions Weight Bearing Restrictions Per Provider Order: No General:   Vital Signs: Therapy Vitals Temp: 98.1 F (36.7 C) Pulse Rate: 71 Resp: 17 BP: (!) 117/56 Patient Position (if appropriate): Lying Oxygen Therapy SpO2: 100 % O2 Device: Room Air Pain:0/10, B sessions.      Therapy/Group: Individual Therapy  [name] P Melaysia Streed 09/05/2023, 8:46 AM

## 2023-09-05 NOTE — Progress Notes (Signed)
 Hypoglycemic Event  CBG: 68  Treatment: 8 oz juice/soda  Symptoms: None  Follow-up CBG: Time:2213 CBG Result:121  Possible Reasons for Event: Unknown  Comments/MD notified:Charge nurse made aware    Merlynn DELENA Sous

## 2023-09-05 NOTE — Progress Notes (Signed)
 Occupational Therapy Session Note  Patient Details  Name: Sarah Davis MRN: [id number] Date of Birth: [birth year]  Today's Date: 09/05/2023 OT Individual Time: 0930-1015 OT Individual Time Calculation (min): 45 min    Short Term Goals: Week 1:  OT Short Term Goal 1 (Week 1): Pt will complete UB dressing with mod A and no more than mod cueing OT Short Term Goal 2 (Week 1): Pt will orient her shirt correctly with min cueing OT Short Term Goal 3 (Week 1): Pt will complete stand pivot to [facility] with min A OT Short Term Goal 4 (Week 1): Pt will complete toileting hygiene with mod A Week 2:     Skilled Therapeutic Interventions/Progress Updates:    1:1 Pt received in the w/c. Pt continues to sit with posterior tilt in the chair - sliding forward throughout session requiring max cues in session to scoot back into her chair. Pt was able to don new shirt with setup with max cues. Pt engaged in visual attention, sustained attention in quiet environment to selective attention in mild distracting environment with the BITS. During activity; activity forced weight shift forward and into slightly anterior pelvic tilt to touch screen but difficulty maintaining it and returned to slumped position in between touching the screen. Pt required moD to remain engaged in any activity today.   Therapy Documentation Precautions:  Restrictions Weight Bearing Restrictions Per Provider Order: No  Pain: No c/o pain in session   Therapy/Group: Individual Therapy  Sarah Davis [facility] 09/05/2023, 2:42 PM

## 2023-09-05 NOTE — Progress Notes (Signed)
 Speech Language Pathology Daily Session Note  Patient Details  Name: Sarah Davis MRN: [id number] Date of Birth: [birth year]  Today's Date: 09/05/2023 SLP Individual Time: 1415-1500 SLP Individual Time Calculation (min): 45 min  Short Term Goals: Week 1: SLP Short Term Goal 1 (Week 1): Pt will utilize calendar as needed to verbalize orientation information w/ modA SLP Short Term Goal 2 (Week 1): Pt will utilize external memory aids as needed to recall recent information w/ 50% accuracy given maxA SLP Short Term Goal 3 (Week 1): Pt will complete functional expression tasks w/ modA SLP Short Term Goal 4 (Week 1): Pt will sustain attention for 10 mins w/ maxA SLP Short Term Goal 5 (Week 1): Pt will improve initiation during functional and structured tasks to maxA SLP Short Term Goal 6 (Week 1): Pt will solve simple problems during functional and structured cognitive tasks w/ maxA  Skilled Therapeutic Interventions: SLP conducted skilled therapy session targeting cognitive goals. SLP targeted orientation through use of external calendar aid. Patient required max assist to orient to date using aid, at first not recalling presence of aid. She was able to utilize aid for date, month, and year, but needed total assist to identify day of the week using scanning method. Nursing entered in the middle of session to complete in and out cath, SLP returned upon completion. Patient completed basic sorting task (sorting cards by suit), sustaining attention to task for 10 minutes with max assist. She required min assist for accuracy. When tasked with switching cognitive set (sorting cards by letters vs numbers), patient required max to total assist to accurately complete, becoming visibly frustrated and subsequently exhibiting significant drop in participation. Patient continues to exhibit high levels of impulsivity and poor reasoning throughout. Patient was left in room with call bell in reach and alarm set. SLP will  continue to target goals per plan of care.        Pain  None endorsed   Therapy/Group: Individual Therapy  Dera Vanaken, M.A., CCC-SLP  Sina Sumpter A Amity Roes 09/05/2023, 3:05 PM

## 2023-09-05 NOTE — IPOC Note (Addendum)
 Overall Plan of Care [facility]) Patient Details Name: Sarah Davis MRN: [id number] DOB: 11-17-1960  Admitting Diagnosis: Acute metabolic encephalopathy  Hospital Problems: Principal Problem:   Acute metabolic encephalopathy     Functional Problem List: Nursing Bladder, Behavior, Bowel, Edema, Endurance, Medication Management, Motor, Nutrition, Pain, Safety, Skin Integrity  PT Balance, Behavior, Endurance, Motor, Pain, Nutrition, Perception, Safety, Skin Integrity  OT Balance, Perception, Behavior, Cognition, Safety, Sensory, Endurance, Skin Integrity, Motor  SLP Cognition, Linguistic, Perception, Endurance  TR         Basic ADL's: OT Eating, Grooming, Bathing, Dressing, Toileting     Advanced  ADL's: OT       Transfers: PT Bed Mobility, Bed to Chair, Car, Occupational psychologist, Research scientist (life sciences): PT Ambulation, Psychologist, prison and probation services, Stairs     Additional Impairments: OT None  SLP Social Cognition, Communication comprehension, expression Problem Solving, Memory, Attention, Awareness  TR      Anticipated Outcomes Item Anticipated Outcome  Self Feeding (S)  Swallowing  supervisionA   Basic self-care  min A  Toileting  min A   Bathroom Transfers min A  Bowel/Bladder  incontinent of bowels/ urinary retention  Transfers  MinA  Locomotion  MinA  Communication  supervisionA  Cognition  minA  Pain  <4 w/ prns  Safety/Judgment  minimal assistance with safety   Therapy Plan: PT Intensity: Minimum of 1-2 x/day ,45 to 90 minutes PT Frequency: 5 out of 7 days PT Duration Estimated Length of Stay: 21-28 Days OT Intensity: Minimum of 1-2 x/day, 45 to 90 minutes OT Frequency: 5 out of 7 days OT Duration/Estimated Length of Stay: 21-25 days SLP Intensity: Minumum of 1-2 x/day, 30 to 90 minutes SLP Frequency: 3 to 5 out of 7 days SLP Duration/Estimated Length of Stay: 4 weeks   Team Interventions: Nursing Interventions Patient/Family Education, Skin  Care/Wound Management, Bladder Management, Bowel Management, Disease Management/Prevention, Pain Management, Medication Management, Discharge Planning, Dysphagia/Aspiration Precaution Training, Cognitive Remediation/Compensation  PT interventions Ambulation/gait training, Community reintegration, DME/adaptive equipment instruction, Neuromuscular re-education, Psychosocial support, Stair training, UE/LE Strength taining/ROM, Wheelchair propulsion/positioning, Warden/ranger, Discharge planning, Functional electrical stimulation, Pain management, Skin care/wound management, Therapeutic Activities, UE/LE Coordination activities, Cognitive remediation/compensation, Disease management/prevention, Functional mobility training, Patient/family education, Splinting/orthotics, Therapeutic Exercise, Visual/perceptual remediation/compensation  OT Interventions Balance/vestibular training, Discharge planning, Pain management, Self Care/advanced ADL retraining, Therapeutic Activities, UE/LE Coordination activities, Therapeutic Exercise, Skin care/wound managment, Patient/family education, Functional mobility training, Disease mangement/prevention, Cognitive remediation/compensation, DME/adaptive equipment instruction, Firefighter, Psychosocial support, UE/LE Strength taining/ROM, Wheelchair propulsion/positioning  SLP Interventions Cognitive remediation/compensation, Functional tasks, Financial trader, Internal/external aids, Patient/family education, Speech/Language facilitation, Dysphagia/aspiration precaution training, Therapeutic Exercise  TR Interventions    SW/CM Interventions Discharge Planning, Psychosocial Support, Patient/Family Education   Barriers to Discharge MD  Medical stability, Home enviroment access/loayout, Incontinence, Lack of/limited family support, Medication compliance, and Behavior  Nursing Decreased caregiver support, Home environment access/layout, Incontinence, Wound  Care Discharge: House  Discharge Home Layout: One level  Discharge Home Access: Stairs to enter  Entrance Stairs-Rails: Can reach both  Entrance Stairs-Number of Steps: 6  PT Incontinence, Decreased caregiver support, Wound Care, Behavior    OT Decreased caregiver support, Home environment access/layout Stairs to enter per chart and unclear 24/7 support  SLP      SW Decreased caregiver support, Insurance for SNF coverage, Lack of/limited family support     Team Discharge Planning: Destination: PT-Home ,OT- Home , SLP-Home Projected Follow-up: PT-Home health PT, 24 hour supervision/assistance, OT-  Home  health OT, SLP-24 hour supervision/assistance, Home Health SLP, Outpatient SLP Projected Equipment Needs: PT-To be determined, OT- To be determined, SLP-None recommended by SLP Equipment Details: PT- , OT-  Patient/family involved in discharge planning: PT- Patient, Family member/caregiver,  OT-Patient, SLP-Patient, Family member/caregiver  MD ELOS: 14-17 days Medical Rehab Prognosis:  Good Assessment: The patient has been admitted for CIR therapies with the diagnosis of metabolic encephalopathy. The team will be addressing functional mobility, strength, stamina, balance, safety, adaptive techniques and equipment, self-care, bowel and bladder mgt, patient and caregiver education,. Goals have been set at min A. Anticipated discharge destination is home.       See Team Conference Notes for weekly updates to the plan of care

## 2023-09-05 NOTE — Progress Notes (Signed)
 Speech Language Pathology Daily Session Note  Patient Details  Name: Sarah Davis MRN: [id number] Date of Birth: 06/30/1960  Today's Date: 09/05/2023 SLP Individual Time: 1030-1100 SLP Individual Time Calculation (min): 30 min  Short Term Goals: Week 1: SLP Short Term Goal 1 (Week 1): Pt will utilize calendar as needed to verbalize orientation information w/ modA SLP Short Term Goal 2 (Week 1): Pt will utilize external memory aids as needed to recall recent information w/ 50% accuracy given maxA SLP Short Term Goal 3 (Week 1): Pt will complete functional expression tasks w/ modA SLP Short Term Goal 4 (Week 1): Pt will sustain attention for 10 mins w/ maxA SLP Short Term Goal 5 (Week 1): Pt will improve initiation during functional and structured tasks to maxA SLP Short Term Goal 6 (Week 1): Pt will solve simple problems during functional and structured cognitive tasks w/ maxA  Skilled Therapeutic Interventions:   Pt and family greeted at bedside. She was agreeable to tx tasks targeting cognition and language. SLP facilitated orientation review, and pt was able to recall month, date, and year spontaneously. She then utilized the calendar to ID the dow w/ minA cues. She benefited from maxA to recall events of the day thus far. She then completed a mildly specific responsive naming task, requiring modA cues for word finding and initiation. Improved attention noted as compared to prev tx session, however, she still benefited from maxA to sustain attention for 10 mins. At the end of tx tasks, she was left in bed with the alarm set and call light within reach. Recommend cont ST per POC.   Pain  No pain report  Therapy/Group: Individual Therapy  Sarah Davis 09/05/2023, 7:59 AM

## 2023-09-06 DIAGNOSIS — G7281 Critical illness myopathy: Secondary | ICD-10-CM

## 2023-09-06 LAB — GLUCOSE, CAPILLARY
Glucose-Capillary: 113 mg/dL — ABNORMAL HIGH (ref 70–99)
Glucose-Capillary: 119 mg/dL — ABNORMAL HIGH (ref 70–99)
Glucose-Capillary: 100 mg/dL — ABNORMAL HIGH (ref 70–99)
Glucose-Capillary: 100 mg/dL — ABNORMAL HIGH (ref 70–99)

## 2023-09-06 NOTE — Progress Notes (Signed)
 PROGRESS NOTE   Subjective/Complaints:  Pt sitting in bed, had it elevated to max height Pt without c/os she denies pain  We discussed an admit to [facility] in 2016 fo rsimilar encephalopathy episode with CIM/CIN  ROS: Denies fevers, chills, N/V, abdominal pain, constipation, diarrhea, SOB, cough, chest pain,  Objective:   No results found.  No results for input(s): WBC, HGB, HCT, PLT in the last 72 hours.  No results for input(s): NA, K, CL, CO2, GLUCOSE, BUN, CREATININE, CALCIUM  in the last 72 hours.   Intake/Output Summary (Last 24 hours) at 09/06/2023 1144 Last data filed at 09/06/2023 0430 Gross per 24 hour  Intake 240 ml  Output 1800 ml  Net -1560 ml        Physical Exam: Vital Signs Blood pressure 116/72, pulse 74, temperature 98.7 F (37.1 C), resp. rate 18, height 5' (1.524 m), weight 51.6 kg, SpO2 95%.   General: No acute distress Mood and affect are appropriate Heart: Regular rate and rhythm no rubs murmurs or extra sounds Lungs: Clear to auscultation, breathing unlabored, no rales or wheezes Abdomen: Positive bowel sounds, soft nontender to palpation, nondistended Extremities: No clubbing, cyanosis, or edema Skin: No evidence of breakdown, no evidence of rash   Skin:  Unstageable buttocks with adherent slough; MASD surrounding--not examined today   MSK:      No apparent deformity. LUE shoulder ROM <50 degrees abduction, flexion.   Neurologic exam:  Cognition: Oriented to place and time except stated year was 1925 instead of 2025 Language: Fluent, Mild dysarthria.  Memory: Mild to moderate deficits. Insight: Poor insight into current condition.  Mood: Flat affect Sensation: Equal and intact in BL UE and Les.  Reflexes: 2+ in BL UE and LEs. CN: 2-12 grossly intact.  Coordination: BL UE intention tremor.  Spasticity: MAS 0 in all extremities.   Strength: 4/5 BL UE except  3- Left deltoid no pain inhibition  3/5 proximal and 4/5 distal BL LE   Physical exam unchanged from the above on reexamination 09/06/23    Assessment/Plan: 1. Functional deficits which require 3+ hours per day of interdisciplinary therapy in a comprehensive inpatient rehab setting. Physiatrist is providing close team supervision and 24 hour management of active medical problems listed below. Physiatrist and rehab team continue to assess barriers to discharge/monitor patient progress toward functional and medical goals  Care Tool:  Bathing    Body parts bathed by patient: Right arm, Left arm, Chest, Abdomen, Front perineal area, Right upper leg, Left upper leg, Face   Body parts bathed by helper: Buttocks     Bathing assist Assist Level: Moderate Assistance - Patient 50 - 74%     Upper Body Dressing/Undressing Upper body dressing   What is the patient wearing?: Pull over shirt    Upper body assist Assist Level: Moderate Assistance - Patient 50 - 74%    Lower Body Dressing/Undressing Lower body dressing      What is the patient wearing?: Incontinence brief     Lower body assist Assist for lower body dressing: Moderate Assistance - Patient 50 - 74%     Toileting Toileting    Toileting assist Assist for toileting: Maximal  Assistance - Patient 25 - 49%     Transfers Chair/bed transfer  Transfers assist  Chair/bed transfer activity did not occur: Safety/medical concerns  Chair/bed transfer assist level: Minimal Assistance - Patient > 75%     Locomotion Ambulation   Ambulation assist      Assist level: Moderate Assistance - Patient 50 - 74% Assistive device: Walker-rolling Max distance: 65   Walk 10 feet activity   Assist     Assist level: Moderate Assistance - Patient - 50 - 74% Assistive device: Walker-rolling   Walk 50 feet activity   Assist Walk 50 feet with 2 turns activity did not occur: Safety/medical concerns  Assist level: Moderate  Assistance - Patient - 50 - 74% Assistive device: Walker-rolling    Walk 150 feet activity   Assist Walk 150 feet activity did not occur: Safety/medical concerns         Walk 10 feet on uneven surface  activity   Assist Walk 10 feet on uneven surfaces activity did not occur: Safety/medical concerns         Wheelchair     Assist Is the patient using a wheelchair?: Yes Type of Wheelchair: Manual    Wheelchair assist level: Dependent - Patient 0% Max wheelchair distance: 150'    Wheelchair 50 feet with 2 turns activity    Assist        Assist Level: Dependent - Patient 0%   Wheelchair 150 feet activity     Assist      Assist Level: Dependent - Patient 0%   Blood pressure 116/72, pulse 74, temperature 98.7 F (37.1 C), resp. rate 18, height 5' (1.524 m), weight 51.6 kg, SpO2 95%.  Medical Problem List and Plan: 1. 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 Severe motor planning and apraxia, impulsive ex. Tried to sit mid-air. Max A overall but can power up with Min A. Will need 24/7 Min A on discharge.   2.  Antithrombotics: -DVT/anticoagulation:  Pharmaceutical: Lovenox              -antiplatelet therapy: Aspirin  81 mg daily  3. Pain Management: Tylenol  as needed  4. Mood/Behavior/Sleep/bipolar disorder II - poorly controlled: BuSpar  15 mg twice daily, Lamictal  titrating to 100 mg daily             -antipsychotic agents: N/A   - 7/15: Very flat affect and poor initiation per nursing; will need full SPV for meals per nursing. Continue current medication  titration - no mania, SI, HI  - Per psych consult inpatient: Will plan to slowly titrate lamictal  since she has not been taking it for at least 1 month and to prevent risk of side effects such as SJS. Will also plan to  start trazodone  at a lower dose compared to her home dose of 400 mg in setting of preventing additional sedation, altered mental status, and since she had been sleeping well while in the hospital. We will defer to outpatient psychiatry for titrating trazodone  if indicated. She can follow-up with her outpatient psychiatric provider at [facility] upon discharge.  - ?resumption on invega 3 mg; will defer to OP   5. Neuropsych/cognition: This patient is not capable of making decisions on her own behalf.  6. Skin/Wound Care: Routine skin checks   - 7/15: Unstageable sacral PI; awaiting WC consult. Safety zone report completed.   Cleanse with Vashe [id number], not rinse, pat dry the peri-wound skin. Apply Xeroform on the wound bed daily. Cover with sacrum foam dressing changing every 3 days or PRN soiling.   7. Fluids/Electrolytes/Nutrition: Routine in and outs with follow-up chemistries   - 7/15: Albumin 2.3; monitor PO intakes, Ensure BID. See #15 below.   8.  CAD/Non-STEMI/acute on chronic diastolic congestive heart failure.  Cardiac catheterization found to have chronic LAD.  Continue aspirin  therapy   - daily weights --stable Filed Weights   09/03/23 0700 09/04/23 0544 09/05/23 0508  Weight: 52.2 kg 52 kg 51.6 kg    9.  Diabetes mellitus with peripheral neuropathy.  Hemoglobin A1c 4.8.  NovoLog  5 units 3 times daily, Semglee  12 units daily             -sugars under fair control at present--continue current regimen Recent Labs    09/05/23 2213 09/06/23 0611 09/06/23 1103  GLUCAP 121* 113* 119*     10.  Leukocytosis/UTI.  Antibiotic therapy completed 11.  Hyperammonemia.  Continue Chronulac  10 mg twice daily.  Follow-up ammonia levels             -last ammonia level 20 on 7/9  12.  Hypothyroidism.  Synthroid  13.  Documented CKD stage III.  Latest creatinine 0.95.  Follow-up chemistries.  Follow-up with [facility] nephrology  - BUN/creatinine have remained stable within her normal  range  14.  Chronic anemia.  Follow-up CBC. Stable 9-10.  Add iron supplement. 15.  Decreased nutritional storage.  Diet advanced to regular.  Follow-up dietary services             -intake poor/inconsistent             -engage RD for assistance   16.  COPD/tobacco use/marijuana use.  Continue inhalers as directed.  Check oxygen saturations every shift.  Urine drug screen negative.  Provide counseling   17. Urinary retention. Foley replaced 7/4. Awaiting WC consult as above for recs prior to removal--see above, DC foley trial started 7/15.    - 7/16 2x ISC overnight; 1x incontinent void; increase flomax  to 0.8 mg, start timed toiletting Q4-6 hours  - 7/17: Start bethenachol 10 mg TID for stimulation; get urinalysis  - 7-18: No improvement in urinary retention; urinalysis borderline but will opt to treat in light of symptoms.  Start Keflex  500 mg twice daily for 7 days.  Add on urine culture--follow-up on results  18. Constipation. Small, hard BM this AM. Add Miralax  daily, sennakot s 1 tab BID.   - LBM 7/16, large, with some rectal bleeding--add cream   19. L drop arm. ?RTC. Patient endorses fall 2 months ago with no workup. Will xray.   - 7/16: Xray w/o acute fractuire or dislocation  20. L calcified carotid plaque. CTA vs US  evaluation as OP.  21. Hx CVA with chronic R HP 2016 noted Left parietal on FLAIR and DWI but had a large DDX including CIN, PRES, was at [facility] 06/14/14-06/24/14 LOS: 5 days A FACE TO FACE EVALUATION WAS PERFORMED  Prentice FORBES Compton 09/06/2023, 11:44 AM

## 2023-09-07 LAB — URINE CULTURE: Culture: >=100000 — AB

## 2023-09-07 LAB — GLUCOSE, CAPILLARY
Glucose-Capillary: 124 mg/dL — ABNORMAL HIGH (ref 70–99)
Glucose-Capillary: 179 mg/dL — ABNORMAL HIGH (ref 70–99)
Glucose-Capillary: 154 mg/dL — ABNORMAL HIGH (ref 70–99)
Glucose-Capillary: 91 mg/dL (ref 70–99)

## 2023-09-07 MED ORDER — SODIUM CHLORIDE 0.9% FLUSH
10.0000 mL | Freq: Two times a day (BID) | INTRAVENOUS | Status: DC
  Administered 2023-09-07 – 2023-09-20 (×22): 10 mL via INTRAVENOUS

## 2023-09-07 MED ORDER — SODIUM CHLORIDE 0.9 % IV SOLN
1.0000 g | INTRAVENOUS | Status: AC
  Administered 2023-09-07 – 2023-09-09 (×3): 1 g via INTRAVENOUS
  Filled 2023-09-07 (×3): qty 1000

## 2023-09-07 NOTE — Progress Notes (Signed)
 Occupational Therapy Session Note  Patient Details  Name: Sarah Davis MRN: [id number] Date of Birth: [birth year]  Today's Date: 09/07/2023 OT Individual Time: 9052-8984 OT Individual Time Calculation (min): 28 min    Skilled Therapeutic Interventions/Progress Updates:    1:1 Pt did not want to participate in any activity with therapist today and declining out of bed activities. Pt oftered to take her to the bathroom and also declined. Pt did however sit EOB and donn clothing with setup and min guard for sit to stands to pull up pants with extra time. Pt did don clean socks with setup and encouragement. Engaged briefly in activity focus on sustained to selective attention to continued to quickly give up and not want to continue to work. Therapetuic use of self to continue to encourage OOB activity but pt declined with decreased understanding of purpose of therapy. Pt missed 47 min  Pt left resting in the bed.  Therapy Documentation Precautions:  Restrictions Weight Bearing Restrictions Per Provider Order: No  Pain: No c/o pain  Therapy/Group: Individual Therapy  [name] Nest [facility] 09/07/2023, 12:21 PM

## 2023-09-07 NOTE — Progress Notes (Signed)
 Physical Therapy Session Note  Patient Details  Name: Sarah Davis MRN: [id number] Date of Birth: [birth year]  Today's Date: 09/07/2023 PT Individual Time: 0730-0742 PT Individual Time Calculation (min): 12 min   Short Term Goals: Week 1:  PT Short Term Goal 1 (Week 1): Pt will complete bed moblility with minA consistently. PT Short Term Goal 2 (Week 1): Pt will complete sit to stand with minA consistently. PT Short Term Goal 3 (Week 1): Pt will complete bed to chair with minA consistently. PT Short Term Goal 4 (Week 1): Pt will ambulate x50' with modA and LRAD.  Skilled Therapeutic Interventions/Progress Updates:  Chart reviewed. Pt received semi-reclined in bed with 5/10 c/o pain in head. Also of note, BP 104/64 mmHg and resting HR 83 bpm. Pt educated on role of PT and importance of participation, but pt still declined sighting HA. Pt participated in rolling in bed for repositioning with maxA. PT to attempt return later in day. Pt was left semi-reclined in bed with alarm engaged, nurse call bell and all needs in reach.     Therapy Documentation Precautions:  Restrictions Weight Bearing Restrictions Per Provider Order: No General: PT Amount of Missed Time (min): 63 Minutes PT Missed Treatment Reason: Patient unwilling to participate    Therapy/Group: Individual Therapy  [name] 09/07/2023, 8:36 AM

## 2023-09-07 NOTE — Progress Notes (Signed)
 Speech Language Pathology Daily Session Note  Patient Details  Name: Sarah Davis MRN: [id number] Date of Birth: [birth year]  Today's Date: 09/07/2023 SLP Individual Time: 1345-1445 SLP Individual Time Calculation (min): 60 min  Short Term Goals: Week 1: SLP Short Term Goal 1 (Week 1): Pt will utilize calendar as needed to verbalize orientation information w/ modA SLP Short Term Goal 2 (Week 1): Pt will utilize external memory aids as needed to recall recent information w/ 50% accuracy given maxA SLP Short Term Goal 3 (Week 1): Pt will complete functional expression tasks w/ modA SLP Short Term Goal 4 (Week 1): Pt will sustain attention for 10 mins w/ maxA SLP Short Term Goal 5 (Week 1): Pt will improve initiation during functional and structured tasks to maxA SLP Short Term Goal 6 (Week 1): Pt will solve simple problems during functional and structured cognitive tasks w/ maxA  Skilled Therapeutic Interventions:  Patient seen for ST targeting cognitive-linguistic goals. Pt lying in bed upon SLP arrival; denied pain. Pt had 80% acc during structured responsive naming tasks when provided min-mod A. During picture description tasks, she required mod-max A for detail and to increase sentence length. She primarily communicated in 1-2 word answers but did answer when asked. One instance of initiating speech independently noted when she requested a drink. Without strategies, pt oriented to year only. Pt asked to use external memory aid to orient herself to temporal concepts. She required mod verbal/visual cueing to recall the month and max cueing to recall date and DOW. She benefited from repetition and errorless learning. SLP then reviewed compensatory memory strategies. Using writing down/repetition strategies, pt had 65% acc recalling details during auditory comprehension tasks where SLP read 1-2 sentence appointment reminders. She sustained attention to tx tasks for approximately 10 minutes when provided  mod A. Pt left in bed with call bell in reach and family members present. Continue ST POC.   Pain Pain Assessment Pain Scale: 0-10 Pain Score: 0-No pain  Therapy/Group: Individual Therapy  Waddell JONETTA Novak, MA CCC-SLP 09/07/2023, 3:37 PM

## 2023-09-07 NOTE — Progress Notes (Signed)
 PROGRESS NOTE   Subjective/Complaints: Does not remember me from yesterday but did remember working with OT this am , per RN intake is good   ROS: Denies fevers, chills, N/V, abdominal pain, constipation, diarrhea, SOB, cough, chest pain,  Objective:   No results found.  No results for input(s): WBC, HGB, HCT, PLT in the last 72 hours.  No results for input(s): NA, K, CL, CO2, GLUCOSE, BUN, CREATININE, CALCIUM  in the last 72 hours.   Intake/Output Summary (Last 24 hours) at 09/07/2023 1012 Last data filed at 09/07/2023 0536 Gross per 24 hour  Intake 531 ml  Output 1750 ml  Net -1219 ml        Physical Exam: Vital Signs Blood pressure 104/64, pulse 83, temperature 98.1 F (36.7 C), temperature source Oral, resp. rate 18, height 5' (1.524 m), weight 51.6 kg, SpO2 94%.   General: No acute distress Mood and affect are appropriate Heart: Regular rate and rhythm no rubs murmurs or extra sounds Lungs: Clear to auscultation, breathing unlabored, no rales or wheezes Abdomen: Positive bowel sounds, soft nontender to palpation, nondistended Extremities: No clubbing, cyanosis, or edema Skin: No evidence of breakdown, no evidence of rash   Skin:  Unstageable buttocks with adherent slough; MASD surrounding--not examined today   MSK:      No apparent deformity. LUE shoulder ROM <50 degrees abduction, flexion.   Neurologic exam:  Cognition: Oriented to place and time except stated year was 1925 instead of 2025 Language: Fluent, Mild dysarthria.  Memory: Mild to moderate deficits. Insight: Poor insight into current condition.  Mood: Flat affect Sensation: Equal and intact in BL UE and Les.  Reflexes: 2+ in BL UE and LEs. CN: 2-12 grossly intact.  Coordination: BL UE intention tremor.  Spasticity: MAS 0 in all extremities.   Strength: 4/5 BL UE except 3- Left deltoid no pain inhibition  3/5  proximal and 4/5 distal BL LE   Physical exam unchanged from the above on reexamination 09/07/23    Assessment/Plan: 1. Functional deficits which require 3+ hours per day of interdisciplinary therapy in a comprehensive inpatient rehab setting. Physiatrist is providing close team supervision and 24 hour management of active medical problems listed below. Physiatrist and rehab team continue to assess barriers to discharge/monitor patient progress toward functional and medical goals  Care Tool:  Bathing    Body parts bathed by patient: Right arm, Left arm, Chest, Abdomen, Front perineal area, Right upper leg, Left upper leg, Face   Body parts bathed by helper: Buttocks     Bathing assist Assist Level: Moderate Assistance - Patient 50 - 74%     Upper Body Dressing/Undressing Upper body dressing   What is the patient wearing?: Pull over shirt    Upper body assist Assist Level: Moderate Assistance - Patient 50 - 74%    Lower Body Dressing/Undressing Lower body dressing      What is the patient wearing?: Incontinence brief     Lower body assist Assist for lower body dressing: Moderate Assistance - Patient 50 - 74%     Toileting Toileting    Toileting assist Assist for toileting: Maximal Assistance - Patient 25 - 49%  Transfers Chair/bed transfer  Transfers assist  Chair/bed transfer activity did not occur: Safety/medical concerns  Chair/bed transfer assist level: Minimal Assistance - Patient > 75%     Locomotion Ambulation   Ambulation assist      Assist level: Moderate Assistance - Patient 50 - 74% Assistive device: Walker-rolling Max distance: 65   Walk 10 feet activity   Assist     Assist level: Moderate Assistance - Patient - 50 - 74% Assistive device: Walker-rolling   Walk 50 feet activity   Assist Walk 50 feet with 2 turns activity did not occur: Safety/medical concerns  Assist level: Moderate Assistance - Patient - 50 - 74% Assistive  device: Walker-rolling    Walk 150 feet activity   Assist Walk 150 feet activity did not occur: Safety/medical concerns         Walk 10 feet on uneven surface  activity   Assist Walk 10 feet on uneven surfaces activity did not occur: Safety/medical concerns         Wheelchair     Assist Is the patient using a wheelchair?: Yes Type of Wheelchair: Manual    Wheelchair assist level: Dependent - Patient 0% Max wheelchair distance: 150'    Wheelchair 50 feet with 2 turns activity    Assist        Assist Level: Dependent - Patient 0%   Wheelchair 150 feet activity     Assist      Assist Level: Dependent - Patient 0%   Blood pressure 104/64, pulse 83, temperature 98.1 F (36.7 C), temperature source Oral, resp. rate 18, height 5' (1.524 m), weight 51.6 kg, SpO2 94%.  Medical Problem List and Plan: 1. 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 Severe motor planning and apraxia, impulsive ex. Tried to sit mid-air. Max A overall but can power up with Min A. Will need 24/7 Min A on discharge.   2.  Antithrombotics: -DVT/anticoagulation:  Pharmaceutical: Lovenox              -antiplatelet therapy: Aspirin  81 mg daily  3. Pain Management: Tylenol  as needed  4. Mood/Behavior/Sleep/bipolar disorder II - poorly controlled: BuSpar  15 mg twice daily, Lamictal  titrating to 100 mg daily             -antipsychotic agents: N/A   - 7/15: Very flat affect and poor initiation per nursing; will need full SPV for meals per nursing. Continue current medication  titration - no mania, SI, HI  - Per psych consult inpatient: Will plan to slowly titrate lamictal  since she has not been taking it for at least 1 month and to prevent risk of side effects such as SJS. Will also plan to start trazodone  at  a lower dose compared to her home dose of 400 mg in setting of preventing additional sedation, altered mental status, and since she had been sleeping well while in the hospital. We will defer to outpatient psychiatry for titrating trazodone  if indicated. She can follow-up with her outpatient psychiatric provider at [facility] upon discharge.  - ?resumption on invega 3 mg; will defer to OP   5. Neuropsych/cognition: This patient is not capable of making decisions on her own behalf.  6. Skin/Wound Care: Routine skin checks   -  7/15: Unstageable sacral PI; awaiting WC consult. Safety zone report completed.   Cleanse with Vashe [id number], not rinse, pat dry the peri-wound skin. Apply Xeroform on the wound bed daily. Cover with sacrum foam dressing changing every 3 days or PRN soiling.   7. Fluids/Electrolytes/Nutrition: Routine in and outs with follow-up chemistries   - 7/15: Albumin 2.3; monitor PO intakes, Ensure BID. See #15 below.   8.  CAD/Non-STEMI/acute on chronic diastolic congestive heart failure.  Cardiac catheterization found to have chronic LAD.  Continue aspirin  therapy   - daily weights --stable Filed Weights   09/04/23 0544 09/05/23 0508  Weight: 52 kg 51.6 kg    9.  Diabetes mellitus with peripheral neuropathy.  Hemoglobin A1c 4.8.  NovoLog  5 units 3 times daily, Semglee  12 units daily             -sugars under fair control at present--continue current regimen Recent Labs    09/06/23 1659 09/06/23 2050 09/07/23 0536  GLUCAP 100* 100* 124*     10.  Leukocytosis/UTI.  Antibiotic therapy completed 11.  Hyperammonemia.  Continue Chronulac  10 mg twice daily.  Follow-up ammonia levels             -last ammonia level 20 on 7/9  12.  Hypothyroidism.  Synthroid  13.  Documented CKD stage III.  Latest creatinine 0.95.  Follow-up chemistries.  Follow-up with [facility] nephrology  - BUN/creatinine have remained stable within her normal range  14.  Chronic anemia.  Follow-up  CBC. Stable 9-10.  Add iron supplement. 15.  Decreased nutritional storage.  Diet advanced to regular.  Follow-up dietary services             -intake poor/inconsistent             -engage RD for assistance   16.  COPD/tobacco use/marijuana use.  Continue inhalers as directed.  Check oxygen saturations every shift.  Urine drug screen negative.  Provide counseling   17. Urinary retention. Foley replaced 7/4. Awaiting WC consult as above for recs prior to removal--see above, DC foley trial started 7/15.    - 7/16 2x ISC overnight; 1x incontinent void; increase flomax  to 0.8 mg, start timed toiletting Q4-6 hours  - 7/17: Start bethenachol 10 mg TID for stimulation; get urinalysis  - 7-18: No improvement in urinary retention; urinalysis borderline but will opt to treat in light of symptoms.  Start Keflex  500 mg twice daily for 7 days.  Add on urine culture--follow-up on results- > 100K [place] p await sensitivities   18. Constipation. Small, hard BM this AM. Add Miralax  daily, sennakot s 1 tab BID.   - LBM 7/16, large, with some rectal bleeding--add cream   19. L drop arm. ?RTC. Patient endorses fall 2 months ago with no workup. Will xray.   - 7/16: Xray w/o acute fractuire or dislocation  20. L calcified carotid plaque. CTA vs US  evaluation as OP.  21. Hx CVA with chronic R HP 2016 noted Left parietal on FLAIR and DWI but had a large DDX including CIN, PRES, was at [facility] center 06/14/14-06/24/14, given persistent of MRI finding, CVA is the cause  for R HP  LOS: 6 days A FACE TO FACE EVALUATION WAS PERFORMED  Sarah Davis 09/07/2023, 10:12 AM

## 2023-09-08 ENCOUNTER — Inpatient Hospital Stay (HOSPITAL_COMMUNITY): Payer: MEDICAID

## 2023-09-08 LAB — COMPREHENSIVE METABOLIC PANEL WITH GFR
ALT: 26 U/L (ref 0–44)
AST: 24 U/L (ref 15–41)
Albumin: 2.8 g/dL — ABNORMAL LOW (ref 3.5–5.0)
Alkaline Phosphatase: 106 U/L (ref 38–126)
Anion gap: 11 (ref 5–15)
BUN: 17 mg/dL (ref 8–23)
CO2: 23 mmol/L (ref 22–32)
Calcium: 9.5 mg/dL (ref 8.9–10.3)
Chloride: 98 mmol/L (ref 98–111)
Creatinine, Ser: 0.96 mg/dL (ref 0.44–1.00)
GFR, Estimated: >60 mL/min (ref >60)
Glucose, Bld: 195 mg/dL — ABNORMAL HIGH (ref 70–99)
Potassium: 3.7 mmol/L (ref 3.5–5.1)
Sodium: 132 mmol/L — ABNORMAL LOW (ref 135–145)
Total Bilirubin: 0.9 mg/dL (ref 0.0–1.2)
Total Protein: 6.6 g/dL (ref 6.5–8.1)

## 2023-09-08 LAB — GLUCOSE, CAPILLARY
Glucose-Capillary: 191 mg/dL — ABNORMAL HIGH (ref 70–99)
Glucose-Capillary: 170 mg/dL — ABNORMAL HIGH (ref 70–99)
Glucose-Capillary: 113 mg/dL — ABNORMAL HIGH (ref 70–99)
Glucose-Capillary: 108 mg/dL — ABNORMAL HIGH (ref 70–99)

## 2023-09-08 NOTE — Progress Notes (Signed)
 Speech Language Pathology Daily Session Note  Patient Details  Name: [name] MRN: [id number] Date of Birth: Nov 17, 1960  Today's Date: 09/08/2023 SLP Individual Time: 1430-1515 SLP Individual Time Calculation (min): 45 min  Short Term Goals: Week 1: SLP Short Term Goal 1 (Week 1): Pt will utilize calendar as needed to verbalize orientation information w/ modA SLP Short Term Goal 2 (Week 1): Pt will utilize external memory aids as needed to recall recent information w/ 50% accuracy given maxA SLP Short Term Goal 3 (Week 1): Pt will complete functional expression tasks w/ modA SLP Short Term Goal 4 (Week 1): Pt will sustain attention for 10 mins w/ maxA SLP Short Term Goal 5 (Week 1): Pt will improve initiation during functional and structured tasks to maxA SLP Short Term Goal 6 (Week 1): Pt will solve simple problems during functional and structured cognitive tasks w/ maxA  Skilled Therapeutic Interventions: Skilled therapy session focused on cognitive goals. SLP facilitated session by targeting orientation task. Patient independently oriented to self, situation, location, month/year. Patient required minA to utilize calendar to determine date. SLP targeted memory through review of days events. Patient recalled broad events from PT/OT, though unable to recall detailed activities nor lunch. SLP introduced memory book to aid in recall of daily events. SLP targeted problem solving and attention through medication management task. Patient required mod-maxA to complete, though sustained attention throughout activity with minA. Patient initially recalling no cognitive changes, though acknowledged medication task was difficult. Patient left in chair with alarm set and call bell in reach. Continue POC.   Pain None reported   Therapy/Group: Individual Therapy  Kyden Potash M.A., CCC-SLP 09/08/2023, 12:57 PM

## 2023-09-08 NOTE — Progress Notes (Signed)
 Physical Therapy Session Note  Patient Details  Name: Sarah Davis MRN: [id number] Date of Birth: 1960-04-21  Today's Date: 09/08/2023 PT Missed Time: 60 Minutes Missed Time Reason: Patient unwilling to participate  Short Term Goals: Week 1:  PT Short Term Goal 1 (Week 1): Pt will complete bed moblility with minA consistently. PT Short Term Goal 2 (Week 1): Pt will complete sit to stand with minA consistently. PT Short Term Goal 3 (Week 1): Pt will complete bed to chair with minA consistently. PT Short Term Goal 4 (Week 1): Pt will ambulate x50' with modA and LRAD.  Skilled Therapeutic Interventions/Progress Updates:     Pt refuses therapy at this time, but no reason given. PT provides max encouragement and pt continues to refuse. PT will follow up as able.   Therapy Documentation Precautions:  Restrictions Weight Bearing Restrictions Per Provider Order: No   Therapy/Group: Individual Therapy  [name], PT, DPT 09/08/2023, 5:17 PM

## 2023-09-08 NOTE — Progress Notes (Signed)
 Patient ID: Layana Konkel, female   DOB: 1960/12/25, 63 y.o.   MRN: [id number]  1033-SW received updates from PT reporting pt is refusing to participate in therapy. SW spoke with pt mother [name] to discuss challenges with PT. She reports she will come up to the hospital to speak with, and intends to come in to help assist. She suggests being stern with patient. She intends to get her schedule every evening so she can be present for her therapy sessions. She also asks if pt can not be given the mango ice cream because she will not eat dinner. Reports she will give to pt after she has eaten. SW shared updates with medical team.  Graeme Jude, MSW, LCSW Office: [phone] Cell: [phone] Fax: [phone]

## 2023-09-08 NOTE — Progress Notes (Signed)
 PROGRESS NOTE   Subjective/Complaints:  No events overnight.  No acute complaints.  Vitals stable     09/08/2023    5:38 AM 09/08/2023    5:37 AM 09/07/2023    9:34 PM  Vitals with BMI  Weight --    Systolic  105 117  Diastolic  67 65  Pulse  99 83    Recent Labs    09/07/23 1700 09/07/23 2133 09/08/23 0615  GLUCAP 154* 91 191*     P.o. intakes appropriate  7 remittent incontinent episodes, but continues to need straight caths for most voids.  If you high-volume, 600+ cc.  Patient states that during the day, she is voiding by herself and getting the urge to void.  last BM 7-20, medium, type IV, incontinent  ROS: Denies fevers, chills, N/V, abdominal pain, constipation, diarrhea, SOB, cough, chest pain,  Objective:   No results found.  No results for input(s): WBC, HGB, HCT, PLT in the last 72 hours.  No results for input(s): NA, K, CL, CO2, GLUCOSE, BUN, CREATININE, CALCIUM  in the last 72 hours.   Intake/Output Summary (Last 24 hours) at 09/08/2023 0806 Last data filed at 09/08/2023 0800 Gross per 24 hour  Intake 228 ml  Output 1000 ml  Net -772 ml        Physical Exam: Vital Signs Blood pressure 105/67, pulse 99, temperature 98.1 F (36.7 C), resp. rate 19, height 5' (1.524 m), weight 51.6 kg, SpO2 95%.   General: No acute distress.  Sitting up in bed Mood and affect are appropriate Heart: Regular rate and rhythm no rubs murmurs or extra sounds Lungs: Clear to auscultation, breathing unlabored, no rales or wheezes Abdomen: Positive bowel sounds, soft nontender to palpation, nondistended Extremities: No clubbing, cyanosis, or edema Skin: No evidence of breakdown, no evidence of rash   Skin:  Unstageable buttocks with adherent slough; MASD surrounding--not examined today   MSK:      No apparent deformity. LUE shoulder ROM <50 degrees abduction, flexion.  --Unchanged  Neurologic exam:  Cognition: Oriented to person, place, and time with cues.  Moderate to severe memory deficits, poor insight. Mild dysarthria.  Mood: Flat affect Sensation: Equal and intact in BL UE and Les.  Reflexes: 2+ in BL UE and LEs. CN: 2-12 grossly intact.  Coordination: BL UE intention tremor.  Spasticity: MAS 0 in all extremities.   Strength: 4/5 BL UE  3/5 proximal and 4/5 distal BL LE   Assessment/Plan: 1. Functional deficits which require 3+ hours per day of interdisciplinary therapy in a comprehensive inpatient rehab setting. Physiatrist is providing close team supervision and 24 hour management of active medical problems listed below. Physiatrist and rehab team continue to assess barriers to discharge/monitor patient progress toward functional and medical goals  Care Tool:  Bathing    Body parts bathed by patient: Right arm, Left arm, Chest, Abdomen, Front perineal area, Right upper leg, Left upper leg, Face   Body parts bathed by helper: Buttocks     Bathing assist Assist Level: Moderate Assistance - Patient 50 - 74%     Upper Body Dressing/Undressing Upper body dressing   What is the patient wearing?: Pull  over shirt    Upper body assist Assist Level: Set up assist    Lower Body Dressing/Undressing Lower body dressing      What is the patient wearing?: Pants     Lower body assist Assist for lower body dressing: Contact Guard/Touching assist     Toileting Toileting    Toileting assist Assist for toileting: Maximal Assistance - Patient 25 - 49%     Transfers Chair/bed transfer  Transfers assist  Chair/bed transfer activity did not occur: Safety/medical concerns  Chair/bed transfer assist level: Minimal Assistance - Patient > 75%     Locomotion Ambulation   Ambulation assist      Assist level: Moderate Assistance - Patient 50 - 74% Assistive device: Walker-rolling Max distance: 65   Walk 10 feet  activity   Assist     Assist level: Moderate Assistance - Patient - 50 - 74% Assistive device: Walker-rolling   Walk 50 feet activity   Assist Walk 50 feet with 2 turns activity did not occur: Safety/medical concerns  Assist level: Moderate Assistance - Patient - 50 - 74% Assistive device: Walker-rolling    Walk 150 feet activity   Assist Walk 150 feet activity did not occur: Safety/medical concerns         Walk 10 feet on uneven surface  activity   Assist Walk 10 feet on uneven surfaces activity did not occur: Safety/medical concerns         Wheelchair     Assist Is the patient using a wheelchair?: Yes Type of Wheelchair: Manual    Wheelchair assist level: Dependent - Patient 0% Max wheelchair distance: 150'    Wheelchair 50 feet with 2 turns activity    Assist        Assist Level: Dependent - Patient 0%   Wheelchair 150 feet activity     Assist      Assist Level: Dependent - Patient 0%   Blood pressure 105/67, pulse 99, temperature 98.1 F (36.7 C), resp. rate 19, height 5' (1.524 m), weight 51.6 kg, SpO2 95%.  Medical Problem List and Plan: 1. 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 Severe motor planning and apraxia, impulsive ex. Tried to sit mid-air. Max A overall but can power up with Min A. Will need 24/7 Min A on discharge.   2.  Antithrombotics: -DVT/anticoagulation:  Pharmaceutical: Lovenox              -antiplatelet therapy: Aspirin  81 mg daily  3. Pain Management: Tylenol  as needed  4. Mood/Behavior/Sleep/bipolar disorder II - poorly controlled: BuSpar  15 mg twice daily, Lamictal  titrating to 100 mg daily             -antipsychotic agents: N/A   - 7/15: Very flat affect and poor initiation per nursing; will need full SPV for meals per  nursing. Continue current medication  titration - no mania, SI, HI  - Per psych consult inpatient: Will plan to slowly titrate lamictal  since she has not been taking it for at least 1 month and to prevent risk of side effects such as SJS. Will also plan to start trazodone  at a lower dose compared to her home dose of 400 mg in setting of preventing additional sedation, altered mental  status, and since she had been sleeping well while in the hospital. We will defer to outpatient psychiatry for titrating trazodone  if indicated. She can follow-up with her outpatient psychiatric provider at [facility] upon discharge.  - ?resumption on invega 3 mg; will defer to OP  7-21: Patient's mother has asked be informed if she refuses therapies, do not give patient the option and she will be more receptive.  5. Neuropsych/cognition: This patient is not capable of making decisions on her own behalf.  6. Skin/Wound Care: Routine skin checks   - 7/15: Unstageable sacral PI; awaiting WC consult. Safety zone report completed.   Cleanse with Vashe [id number], not rinse, pat dry the peri-wound skin. Apply Xeroform on the wound bed daily. Cover with sacrum foam dressing changing every 3 days or PRN soiling.   7. Fluids/Electrolytes/Nutrition: Routine in and outs with follow-up chemistries   - 7/15: Albumin 2.3; monitor PO intakes, Ensure BID. See #15 below.   - 7-21: No AM labs; will order for late today--mild hyponatremia, 132.  Other labs are stable.  Repeat in a.m, may have been secondary to Keflex  over the weekend which is now discontinued.  8.  CAD/Non-STEMI/acute on chronic diastolic congestive heart failure.  Cardiac catheterization found to have chronic LAD.  Continue aspirin  therapy   - daily weights --not being performed, will reorder today [facility] Weights   09/05/23 0508  Weight: 51.6 kg    9.  Diabetes mellitus with peripheral neuropathy.  Hemoglobin A1c 4.8.  NovoLog  5 units 3 times daily, Semglee  12  units daily             -sugars under fair control at present--continue current regimen Recent Labs    09/07/23 1700 09/07/23 2133 09/08/23 0615  GLUCAP 154* 91 191*     10.  Leukocytosis/UTI.  Antibiotic therapy completed 11.  Hyperammonemia.  Continue Chronulac  10 mg twice daily.  Follow-up ammonia levels             -last ammonia level 20 on 7/9  12.  Hypothyroidism.  Synthroid  13.  Documented CKD stage III.  Latest creatinine 0.95.  Follow-up chemistries.  Follow-up with [facility] nephrology  - BUN/creatinine have remained stable within her normal range  14.  Chronic anemia.  Follow-up CBC. Stable 9-10.  Add iron supplement. 15.  Decreased nutritional storage.  Diet advanced to regular.  Follow-up dietary services             -intake poor/inconsistent             -engage RD for assistance   16.  COPD/tobacco use/marijuana use.  Continue inhalers as directed.  Check oxygen saturations every shift.  Urine drug screen negative.  Provide counseling   17. Urinary retention/relapsing UTI. Foley replaced 7/4. Awaiting WC consult as above for recs prior to removal--see above, DC foley trial started 7/15.    - 7/16 2x ISC overnight; 1x incontinent void; increase flomax  to 0.8 mg, start timed toiletting Q4-6 hours  - 7/17: Start bethenachol 10 mg TID for stimulation; get urinalysis  - 7-18: No improvement in urinary retention; urinalysis borderline but will opt to treat in light of symptoms.  Start Keflex  500 mg twice daily for 7 days.  Add on urine culture--follow-up on results- > 100K Klebsiealla p await sensitivities   7-21: Pan resistant Klebsiella pneumonia; .  With same species on 7-3 urine culture, was through course of meropenem  at that time.  Pharmacy started Invanz  1 g for 3 doses  yesterday for ESBL; discussing with ID pharmacy given failure of their first treatment.  Will get renal ultrasound today to evaluate for structural causes of recurrence  18. Constipation. Small, hard BM  this AM. Add Miralax  daily, sennakot s 1 tab BID.   - BM 7/16, large, with some rectal bleeding--add cream  LBM 7-20   19. L drop arm. ?RTC. Patient endorses fall 2 months ago with no workup. Will xray.   - 7/16: Xray w/o acute fractuire or dislocation  20. L calcified carotid plaque. CTA vs US  evaluation as OP.   21. Hx CVA with chronic R HP 2016 noted Left parietal on FLAIR and DWI but had a large DDX including CIN, PRES, was at [facility] center 06/14/14-06/24/14, given persistent of MRI finding, CVA is the cause  for R HP   7-21: Patient denies right hip pain on today's exam   LOS: 7 days A FACE TO FACE EVALUATION WAS PERFORMED  Joesph JAYSON Likes 09/08/2023, 8:06 AM

## 2023-09-08 NOTE — Progress Notes (Signed)
 Occupational Therapy Session Note  Patient Details  Name: Sarah Davis MRN: [id number] Date of Birth: 1961/01/01  Session 1  Today's Date: 09/08/2023 OT Individual Time: 9262-9180 OT Individual Time Calculation (min): 42 min   Session 2  Today's Date: 09/08/2023 OT Individual Time: 8696-8646 OT Individual Time Calculation (min): 50 min  and Today's Date: 09/08/2023 OT Missed Time: 10 Minutes Missed Time Reason: Patient unwilling/refused to participate without medical reason   Short Term Goals: Week 1:  OT Short Term Goal 1 (Week 1): Pt will complete UB dressing with mod A and no more than mod cueing OT Short Term Goal 2 (Week 1): Pt will orient her shirt correctly with min cueing OT Short Term Goal 3 (Week 1): Pt will complete stand pivot to the [facility] with min A OT Short Term Goal 4 (Week 1): Pt will complete toileting hygiene with mod A  Skilled Therapeutic Interventions/Progress Updates:    Session 1 Pt received supine with no c/o pain, agreeable to OT session. Pt with very flat affect throughout session, perhaps a bit more argumentative today than past sessions. She was fully oriented. Switched out manual w/c for TIS w/c for safety with sitting balance, encourage OOB, and pressure relief for sacral wound. She demonstrated improvements in dressing apraxia overall and ADL transfers. She came to EOB with min cueing for initiation and min A. She stood from EOB with min A and completed functional mobility into the bathroom with the RW with min-mod A. Transfer to toilet with mod A. Max A for clothing management. She stated I do not have to pee. Encouraged her to sit for several minutes with water  running to encourage void. No urine void but she was unaware of small BM void. She stood and required max A for hygiene. She was able to wipe anteriorly in standing and pull up pants with cueing. She donned a shirt with min A to pull down posteriorly. RN entered and she took medication. She stated I  don';t want nothing to eat and then 2 min later stated what's there to eat around here. Provided pt with breakfast tray and she ate 1/2 of her potatoes. Similarly, after two bites she stated she did not want anything else to eat but then ate several more bites after. No s/s of aspiration and appropriate attention to swallow. Pt was left sitting up in the wheelchair with all needs met, chair alarm set, and call bell within reach.    Session 2 Pt received sitting in the w/c with her mother present, encouraging her to participate. She reported some pain/discomfort in her bottom. Discussed weight shifting and pressure relief on roho. Encouraged her to attempt urine void despite pt voicing that she does not need to go/does not want to try. She completed functional mobility into the bathroom with the RW with min A. She required max cueing for clothing management. With increased time and water  dripping, she had urine void. She returned to the w/c with the RW with min A. Pt was taken via w/c to the therapy gym for time management. Pt completed standing level stepping activity with no UE support using a 5 in step 3x5 repetitions. Activity performed to challenge dynamic standing balance and functional activity tolerance to simulate household threshold management and reduce fall risk. Pt required mod A overall. She was very frustrated with OT at times but redirectable to task at hand. Pt completed blocked practice sit <> stand, 3x5 repetitions with no UE support, to challenge generalized strengthening  for ADL transfers, as well as increasing functional activity tolerance and cardiorespiratory endurance. Pt required mod A. She then completed 2x10 repetitions of modified sit ups to address core stability and dynamic sitting balance. She required min cueing overall. Following this she stated I wanna quit, nobody can say I have to do this and don't bring my mama into this when OT stated that her mother was really  wanting her to finish session . Upon return to her room, her mother was very disappointed pt declined and had her apologize to OT and state she would do better tomorrow. Pt was left sitting up in the wheelchair with all needs met, chair alarm set, and call bell within reach.     Therapy Documentation Precautions:  Restrictions Weight Bearing Restrictions Per Provider Order: No  Therapy/Group: Individual Therapy  [name] 09/08/2023, 6:20 AM

## 2023-09-08 NOTE — Progress Notes (Signed)
 Nutrition Follow-up  DOCUMENTATION CODES:   Not applicable  INTERVENTION:   Continue Ensure Plus High Protein PO BID, each supplement provides 350 kcal and 20 grams of protein. Encourage intake of Juven PO BID, each packet provides 80 calories, 8 grams of carbohydrate, 2.5  grams of protein (collagen), 7 grams of L-arginine and 7 grams of L-glutamine; supplement contains CaHMB, Vitamins C, E, B12 and Zinc  to promote wound healing. Continue MVI with minerals daily.  NUTRITION DIAGNOSIS:   Increased nutrient needs related to wound healing as evidenced by estimated needs.  Ongoing   GOAL:   Patient will meet greater than or equal to 90% of their needs  Progressing   MONITOR:   PO intake, Supplement acceptance, Diet advancement, Labs, Skin  REASON FOR ASSESSMENT:   Consult Assessment of nutrition requirement/status, Other (Comment), Wound healing (Maintain follow up from acute care)  ASSESSMENT:   Pt with hx of CKD3, COPD, CAD, hx CVA, pancreatitis, right hemiparesis, Hepatitis C, CHF,  and DM type 2 presented to ED from [facility] for non-STEMI and underwent cardiac cath where she was found to have chronic LAD. Now transferred to CIR.  Patient states that she is eating ~50% of her meals and drinks 2 Ensure supplements per day. She is not drinking the Juven as ordered BID. Per documentation, she is refusing Juven ~50% of the time and is accepting Ensure 1-2 times per day.   Currently on a dysphagia 3 diet with thin liquids. Meal intakes: 10-90%  Noted patient is refusing to participate in therapy. Mom to be present for future therapy sessions. Mom also requested that patient not receive mango ice cream because she will not eat dinner.   Labs reviewed. Na 132 CBG: [phone]  Medications reviewed and include vitamin C , ferrous sulfate , folic acid , novolog , semglee , lactulose , MVI with minerals, Juven, miralax , senokot-s, flomax .  CIR admit weight: 52.3 kg  (7/15) Current weight: 51.6 kg (7/18)  Diet Order:   Diet Order             DIET DYS 3 Fluid consistency: Thin  Diet effective now                   EDUCATION NEEDS:   Not appropriate for education at this time  Skin:  Skin Assessment: Skin Integrity Issues: Skin Integrity Issues:: Unstageable Unstageable: Sacrum  Last BM:  7/21 type 7  Height:   Ht Readings from Last 1 Encounters:  09/01/23 5' (1.524 m)    Weight:   Wt Readings from Last 1 Encounters:  09/01/23 54 kg    BMI:  Body mass index is 22.22 kg/m.  Estimated Nutritional Needs:   Kcal:  1500-1700 kcal  Protein:  75-90 gm /day  Fluid:  >1.5L/day   Suzen HUNT RD, LDN, CNSC Contact via secure chat. If unavailable, use group chat RD Inpatient.

## 2023-09-09 LAB — CBC WITH DIFFERENTIAL/PLATELET
Abs Immature Granulocytes: 0.04 K/uL (ref 0.00–0.07)
Basophils Absolute: 0 K/uL (ref 0.0–0.1)
Basophils Relative: 0 %
Eosinophils Absolute: 0.2 K/uL (ref 0.0–0.5)
Eosinophils Relative: 3 %
HCT: 32.4 % — ABNORMAL LOW (ref 36.0–46.0)
Hemoglobin: 10.2 g/dL — ABNORMAL LOW (ref 12.0–15.0)
Immature Granulocytes: 1 %
Lymphocytes Relative: 31 %
Lymphs Abs: 2.7 K/uL (ref 0.7–4.0)
MCH: 28 pg (ref 26.0–34.0)
MCHC: 31.5 g/dL (ref 30.0–36.0)
MCV: 89 fL (ref 80.0–100.0)
Monocytes Absolute: 0.7 K/uL (ref 0.1–1.0)
Monocytes Relative: 8 %
Neutro Abs: 5 K/uL (ref 1.7–7.7)
Neutrophils Relative %: 57 %
Platelets: 227 K/uL (ref 150–400)
RBC: 3.64 MIL/uL — ABNORMAL LOW (ref 3.87–5.11)
RDW: 17.7 % — ABNORMAL HIGH (ref 11.5–15.5)
WBC: 8.6 K/uL (ref 4.0–10.5)
nRBC: 0 % (ref 0.0–0.2)

## 2023-09-09 LAB — GLUCOSE, CAPILLARY
Glucose-Capillary: 134 mg/dL — ABNORMAL HIGH (ref 70–99)
Glucose-Capillary: 210 mg/dL — ABNORMAL HIGH (ref 70–99)
Glucose-Capillary: 136 mg/dL — ABNORMAL HIGH (ref 70–99)
Glucose-Capillary: 134 mg/dL — ABNORMAL HIGH (ref 70–99)

## 2023-09-09 LAB — BASIC METABOLIC PANEL WITH GFR
Anion gap: 9 (ref 5–15)
BUN: 23 mg/dL (ref 8–23)
CO2: 24 mmol/L (ref 22–32)
Calcium: 9.7 mg/dL (ref 8.9–10.3)
Chloride: 103 mmol/L (ref 98–111)
Creatinine, Ser: 1.14 mg/dL — ABNORMAL HIGH (ref 0.44–1.00)
GFR, Estimated: 54 mL/min — ABNORMAL LOW (ref >60)
Glucose, Bld: 130 mg/dL — ABNORMAL HIGH (ref 70–99)
Potassium: 3.7 mmol/L (ref 3.5–5.1)
Sodium: 136 mmol/L (ref 135–145)

## 2023-09-09 MED ORDER — SODIUM CHLORIDE 0.9 % IV SOLN: INTRAVENOUS | Status: AC

## 2023-09-09 MED ORDER — COLLAGENASE 250 UNIT/GM EX OINT
TOPICAL_OINTMENT | Freq: Every day | CUTANEOUS | Status: DC
  Filled 2023-09-09 (×3): qty 30

## 2023-09-09 MED ORDER — POLYETHYLENE GLYCOL 3350 17 G PO PACK
17.0000 g | PACK | Freq: Every day | ORAL | Status: DC
  Administered 2023-09-10: 17 g via ORAL
  Filled 2023-09-09: qty 1

## 2023-09-09 NOTE — Progress Notes (Signed)
 Occupational Therapy Weekly Progress Note  Patient Details  Name: Sarah Davis MRN: [id number] Date of Birth: Nov 27, 1960  Beginning of progress report period: September 02, 2023 End of progress report period: September 09, 2023  Today's Date: 09/09/2023 OT Missed Time: 75 Minutes Missed Time Reason: Patient unwilling/refused to participate without medical reason   Patient has met 4 of 4 short term goals. Sarah Davis has made good progress toward her OT goals, much improved motor planning, sequencing, and dressing apraxia. Her ADL transfers have improved to a min-mod A level. She has now begun having behavioral issues and is refusing session frequently. Her mother is very encouraging and able to facilitate participation.   Patient continues to demonstrate the following deficits: muscle weakness, decreased cardiorespiratoy endurance, decreased initiation, decreased attention, decreased awareness, decreased problem solving, decreased safety awareness, decreased memory, and delayed processing, and decreased standing balance, decreased postural control, and decreased balance strategies and therefore will continue to benefit from skilled OT intervention to enhance overall performance with BADL and Reduce care partner burden.  Patient progressing toward long term goals..  Continue plan of care.  OT Short Term Goals Week 1:  OT Short Term Goal 1 (Week 1): Pt will complete UB dressing with mod A and no more than mod cueing OT Short Term Goal 1 - Progress (Week 1): Met OT Short Term Goal 2 (Week 1): Pt will orient her shirt correctly with min cueing OT Short Term Goal 2 - Progress (Week 1): Met OT Short Term Goal 3 (Week 1): Pt will complete stand pivot to the [facility] with min A OT Short Term Goal 3 - Progress (Week 1): Met OT Short Term Goal 4 (Week 1): Pt will complete toileting hygiene with mod A OT Short Term Goal 4 - Progress (Week 1): Met Week 2:  OT Short Term Goal 1 (Week 2): Pt will complete LB dressing with min  A OT Short Term Goal 2 (Week 2): Pt will complete ADLs at shower level no more than min cueing for sequencing and initiation OT Short Term Goal 3 (Week 2): Pt will complete stand pivot to the [facility] with CGA OT Short Term Goal 4 (Week 2): Pt will demo improved motor planning during ADLs by requiring no more than min cueing for sequencing  Skilled Therapeutic Interventions/Progress Updates:    Pt supine upon entry to room. OT entering with RN who was beginning morning medication administration. OT provided several options for therapy and pt declined all of them, stating you can leave me alone. Pt getting more agitated with repeated attempts. 75 min.   Therapy Documentation Precautions:  Restrictions Weight Bearing Restrictions Per Provider Order: No Therapy/Group: Individual Therapy  Sarah Davis 09/09/2023, 7:44 AM

## 2023-09-09 NOTE — Plan of Care (Signed)
  Problem: Consults Goal: RH GENERAL PATIENT EDUCATION Description: See Patient Education module for education specifics. Outcome: Progressing   Problem: RH BLADDER ELIMINATION Goal: RH STG MANAGE BLADDER WITH ASSISTANCE Description: STG Manage Bladder With minimal Assistance Outcome: Progressing

## 2023-09-09 NOTE — Consult Note (Addendum)
 WOC Nurse wound follow up Reassessment of Sacrum pressure injury performed remotely after review of progress notes and photos in the EMR.   Refer to previous consult performed 7/15. Unstageable pressure injury remains 100% yellow slough, surrounded by red, moist macerated wound edges beginning to dry and peel. I will change the plan of care to Santyl  to provide enzymatic debridement of nonviable tissue.  This pressure injury was noted as present on admission to rehab according to the bedside nurses' wound care flow sheet.  Topical treatment orders provided for bedside nurses to perform as follows: Apply Santyl  to sacrum Q day, then cover with moist 2X2 and foam dressing.  Change foam dressing Q 3 days or PRN soiling.   Please re-consult if further assistance is needed.  Thank-you,  Stephane Fought MSN, RN, CWOCN, CWCN-AP, CNS Contact Mon-Fri 0700-1500: [phone]

## 2023-09-09 NOTE — Progress Notes (Signed)
 Physical Therapy Weekly Progress Note  Patient Details  Name: Sarah Davis MRN: [id number] Date of Birth: 1960/11/27  Beginning of progress report period: September 02, 2023 End of progress report period: September 09, 2023  Today's Date: 09/09/2023 PT Individual Time: 8965-8884 PT Individual Time Calculation (min): 41 min   Today's Date: 09/09/2023 PT Individual Time: 8695-8654 PT Individual Time Calculation (min): 41 min   Patient has met 4 of 4 short term goals. Pt is progressing toward mobility goals, improving independence with bed mobility, transfers, balance, and ambulation. Pt is performing most mobility tasks with minA, but has significant safety awareness impairments and has also been refusing therapy, slowing progress. Pt performs better when mother is present and will benefit from hands on family education prior to discharge.   Patient continues to demonstrate the following deficits muscle weakness, decreased cardiorespiratoy endurance, decreased coordination, decreased awareness, decreased problem solving, and decreased safety awareness, and decreased sitting balance, decreased standing balance, decreased postural control, and decreased balance strategies and therefore will continue to benefit from skilled PT intervention to increase functional independence with mobility.  Patient progressing toward long term goals..  Continue plan of care.  PT Short Term Goals Week 1:  PT Short Term Goal 1 (Week 1): Pt will complete bed moblility with minA consistently. PT Short Term Goal 1 - Progress (Week 1): Met PT Short Term Goal 2 (Week 1): Pt will complete sit to stand with minA consistently. PT Short Term Goal 2 - Progress (Week 1): Met PT Short Term Goal 3 (Week 1): Pt will complete bed to chair with minA consistently. PT Short Term Goal 3 - Progress (Week 1): Met PT Short Term Goal 4 (Week 1): Pt will ambulate x50' with modA and LRAD. PT Short Term Goal 4 - Progress (Week 1): Met Week 2:  PT  Short Term Goal 1 (Week 2): Pt will complete sit to stand with CGA consistently. PT Short Term Goal 2 (Week 2): Pt will complete bed to chair with CGA consistently. PT Short Term Goal 3 (Week 2): Pt will ambulate x100' with minA and LRAD.  Skilled Therapeutic Interventions/Progress Updates:     1st Session: Pt received supine in bed and initially refuses therapy. Pt's mother is present and sternly advises pt to participate and pt is agreeable. No complaint of pain. Pt noted to have been incontinent of bowel. Pt performs bilateral rolling with minA, as well as bridging, to assist with brief change, pericare, and donning clean pants. Pt performs supine to sit with cues for sequencing and positioning. Pt performs stand step to [facility] with minA and cues for hand placement and initiation. WC transport to gym. Pt completes sit to stand multiple times during session with CGA and cues for hand placement. Pt ambulates x70' and x90' with minA and RW, with cues for upright gaze to improve posture and balance, as well as increasing proximity to RW for safety. Extended seated rest breaks between bouts. Pt performs 2x5 reps sit to stand with CGA and cues for sequencing and optimal performance. Performed for strengthen and functional transfer training. WC transport back to room. Left seated with all needs within reach.   2nd Session: Pt received supine in bed and agrees to therapy. No complaint of pain. Pt noted to have been incontinent of bowel and requires assistance cleaning. Pt performs bilateral rolling with minA and cues for sequencing. Following, pt perform supine to sit with bed features and cues for positioning. Stand step to [facility] with minA and  cues for initiation. WC transport to gym. Pt performs stand step to mat table with CGA and same cues. Pt performs squigz retrieval to challenge standing balance and coordination, without use of AD and PT providing CGA. Pt then completes conrnhole activity to encourage increased  time on feet as well as more dynamic movements. Pt is able to toss all bags without upper extremity support and PT providing CGA. Pt then completes alternating foot taps on 4 step with minA and no AD, with cues for lateral weight shifting and sequencing. Pt completes 2x10 with seated rest break. Pt then reports having urinated all over myself. PT provides minA for stand step from mat>WC>bed with RW. Pt performs bilateral rolling and bridging with minA to change. Pt left supine with all needs within reach.   Therapy Documentation Precautions:  Restrictions Weight Bearing Restrictions Per Provider Order: No   Therapy/Group: Individual Therapy  [name], PT, DPT 09/09/2023, 5:21 PM

## 2023-09-09 NOTE — Progress Notes (Signed)
 Patient ID: Sarah Davis, female   DOB: [birth year], 63 y.o.   MRN: [id number]  SW met with pt and pt mother in room to provide updates from team conference, and d/c date 8/5. SW discussed with her mother family edu wit patient daughters who will be assisting. She intends to discuss with them which Friday works best.   Graeme Jude, MSW, LCSW Office: [phone] Cell: [phone] Fax: [phone]

## 2023-09-09 NOTE — Progress Notes (Signed)
 Speech Language Pathology Daily Session Note  Patient Details  Name: Sarah Davis MRN: [id number] Date of Birth: Jun 24, 1960  Today's Date: 09/09/2023 SLP Individual Time: 1458-1530 SLP Individual Time Calculation (min): 32 min and Today's Date: 09/09/2023 SLP Missed Time: 8 Minutes Missed Time Reason: Nursing care  Short Term Goals: Week 1: SLP Short Term Goal 1 (Week 1): Pt will utilize calendar as needed to verbalize orientation information w/ modA SLP Short Term Goal 2 (Week 1): Pt will utilize external memory aids as needed to recall recent information w/ 50% accuracy given maxA SLP Short Term Goal 3 (Week 1): Pt will complete functional expression tasks w/ modA SLP Short Term Goal 4 (Week 1): Pt will sustain attention for 10 mins w/ maxA SLP Short Term Goal 5 (Week 1): Pt will improve initiation during functional and structured tasks to maxA SLP Short Term Goal 6 (Week 1): Pt will solve simple problems during functional and structured cognitive tasks w/ maxA  Skilled Therapeutic Interventions:   Pt and mom greeted at bedside after nursing completed brief change. Tx tasks targeted cognition and language. She recalled the month/date/year independently, but required s cues for current city. She required mod-maxA to recall information from lunch and prev tx session. She frequently responded I don't know, though her mom assisted to encourage attempted responses. She then completed picture compare/contrast task re household and community items. She benefited from [facility] for word finding/sentence formulation and functional problem solving. Pt was not able to sense and/or initiate need to utilize the bathroom, however did blurt out I just pissed myself. She then benefited from maxA for verbal problem solving and sequencing re request for assistance from nursing. Nursing notified upon SLP departure. She was left with her bed alarm on and mom present. Recommend cont ST per POC.   Pain   None  Therapy/Group: Individual Therapy  Recardo DELENA Mole 09/09/2023, 9:53 PM

## 2023-09-09 NOTE — Progress Notes (Signed)
 PROGRESS NOTE   Subjective/Complaints:  No events overnight.  No acute complaints.  A.m. labs significant for resolution of hyponatremia, however new AKI, BUN 23, creatinine 1.14.  Otherwise, stable.  Hypotensive, vitals otherwise stable.    09/09/2023    5:21 AM 09/08/2023    8:02 PM 09/08/2023    3:42 PM  Vitals with BMI  Systolic 96 105 94  Diastolic 65 54 59  Pulse 65 78 84   Continent of urine throughout yesterday, low PVRs.  Had multiple incontinent bowel movements, green/black, large.  ROS: Denies fevers, chills, N/V, abdominal pain, constipation, diarrhea, SOB, cough, chest pain,   Objective:   US  RENAL Result Date: 09/08/2023 CLINICAL DATA:  Urinary retention. EXAM: RENAL / URINARY TRACT ULTRASOUND COMPLETE COMPARISON:  None Available. FINDINGS: Right Kidney: Renal measurements: 10.5 cm x 4.5 cm x 4.2 cm = volume: 103.0 mL. Echogenicity within normal limits. No mass or hydronephrosis visualized. Left Kidney: Renal measurements: 10.1 cm x 4.7 cm x 4.8 cm = volume: 117.2 mL. Echogenicity within normal limits. No mass is visualized. There is mild left-sided hydronephrosis. Bladder: Appears normal for degree of bladder distention. The bilateral ureteral jets are visualized. Other: None. IMPRESSION: Mild left-sided hydronephrosis. Electronically Signed   By: Suzen Dials M.D.   On: 09/08/2023 20:12    Recent Labs    09/09/23 0457  WBC 8.6  HGB 10.2*  HCT 32.4*  PLT 227    Recent Labs    09/08/23 0935 09/09/23 0457  NA 132* 136  K 3.7 3.7  CL 98 103  CO2 23 24  GLUCOSE 195* 130*  BUN 17 23  CREATININE 0.96 1.14*  CALCIUM  9.5 9.7     Intake/Output Summary (Last 24 hours) at 09/09/2023 0954 Last data filed at 09/08/2023 1104 Gross per 24 hour  Intake 236 ml  Output --  Net 236 ml        Physical Exam: Vital Signs Blood pressure 96/65, pulse 65, temperature 98.3 F (36.8 C), temperature source  Oral, resp. rate 17, height 5' (1.524 m), weight 51.6 kg, SpO2 97%.   General: No acute distress. Laying in bed Mood and affect are appropriate Heart: Regular rate and rhythm no rubs murmurs or extra sounds Lungs: Clear to auscultation, breathing unlabored, no rales or wheezes Abdomen: Positive bowel sounds, soft nontender to palpation, nondistended Extremities: No clubbing, cyanosis, or edema Skin: No evidence of breakdown, no evidence of rash.  Peripheral IV  Unstageable buttocks with adherent slough; MASD surrounding--smaller, improving appearance    MSK:      No apparent deformity. LUE shoulder ROM spontaneously improved on exam, greater than 100 degrees abduction and flexion, no apparent weakness.  Neurologic exam:  Cognition: Oriented to person, place, and time with cues.  Moderate to severe memory deficits, poor insight.  Ongoing. Mild dysarthria.  Mood: Flat affect, intermittently agitated Sensation: Equal and intact in BL UE and Les.  Reflexes: 2+ in BL UE and LEs. CN: 2-12 grossly intact.  Coordination: BL UE intention tremor.  Spasticity: MAS 0 in all extremities.   Strength: 4+/5 BL UE and bilateral lower extremities.   Assessment/Plan: 1. Functional deficits which require 3+ hours per  day of interdisciplinary therapy in a comprehensive inpatient rehab setting. Physiatrist is providing close team supervision and 24 hour management of active medical problems listed below. Physiatrist and rehab team continue to assess barriers to discharge/monitor patient progress toward functional and medical goals  Care Tool:  Bathing    Body parts bathed by patient: Right arm, Left arm, Chest, Abdomen, Front perineal area, Right upper leg, Left upper leg, Face   Body parts bathed by helper: Buttocks     Bathing assist Assist Level: Moderate Assistance - Patient 50 - 74%     Upper Body Dressing/Undressing Upper body dressing   What is the patient wearing?: Pull over  shirt    Upper body assist Assist Level: Set up assist    Lower Body Dressing/Undressing Lower body dressing      What is the patient wearing?: Pants     Lower body assist Assist for lower body dressing: Contact Guard/Touching assist     Toileting Toileting    Toileting assist Assist for toileting: Maximal Assistance - Patient 25 - 49%     Transfers Chair/bed transfer  Transfers assist  Chair/bed transfer activity did not occur: Safety/medical concerns  Chair/bed transfer assist level: Minimal Assistance - Patient > 75%     Locomotion Ambulation   Ambulation assist      Assist level: Moderate Assistance - Patient 50 - 74% Assistive device: Walker-rolling Max distance: 65   Walk 10 feet activity   Assist     Assist level: Moderate Assistance - Patient - 50 - 74% Assistive device: Walker-rolling   Walk 50 feet activity   Assist Walk 50 feet with 2 turns activity did not occur: Safety/medical concerns  Assist level: Moderate Assistance - Patient - 50 - 74% Assistive device: Walker-rolling    Walk 150 feet activity   Assist Walk 150 feet activity did not occur: Safety/medical concerns         Walk 10 feet on uneven surface  activity   Assist Walk 10 feet on uneven surfaces activity did not occur: Safety/medical concerns         Wheelchair     Assist Is the patient using a wheelchair?: Yes Type of Wheelchair: Manual    Wheelchair assist level: Dependent - Patient 0% Max wheelchair distance: 150'    Wheelchair 50 feet with 2 turns activity    Assist        Assist Level: Dependent - Patient 0%   Wheelchair 150 feet activity     Assist      Assist Level: Dependent - Patient 0%   Blood pressure 96/65, pulse 65, temperature 98.3 F (36.8 C), temperature source Oral, resp. rate 17, height 5' (1.524 m), weight 51.6 kg, SpO2 97%.  Medical Problem List and Plan: 1. 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 Severe motor planning and apraxia, impulsive ex. Tried to sit mid-air. Max A overall but can power up with Min A. Will need 24/7 Min A on discharge.   7/22: Apraxia, initiation and sequencing better. Gait limited by poor therapy tolerance/behaviors. D3 diet with thin liquids, full supervision.   2.  Antithrombotics: -DVT/anticoagulation:  Pharmaceutical: Lovenox              -  antiplatelet therapy: Aspirin  81 mg daily  3. Pain Management: Tylenol  as needed  4. Mood/Behavior/Sleep/bipolar disorder II - poorly controlled: BuSpar  15 mg twice daily, Lamictal  titrating to 100 mg daily             -antipsychotic agents: N/A   - 7/15: Very flat affect and poor initiation per nursing; will need full SPV for meals per nursing. Continue current medication  titration - no mania, SI, HI  - Per psych consult inpatient: Will plan to slowly titrate lamictal  since she has not been taking it for at least 1 month and to prevent risk of side effects such as SJS. Will also plan to start trazodone  at a lower dose compared to her home dose of 400 mg in setting of preventing additional sedation, altered mental status, and since she had been sleeping well while in the hospital. We will defer to outpatient psychiatry for titrating trazodone  if indicated. She can follow-up with her outpatient psychiatric provider at [facility] upon discharge.  - ?resumption on invega 3 mg; will defer to OP  7-21: Patient's mother has asked be informed if she refuses therapies, do not give patient the option and she will be more receptive.   - 7/22: Patient starting to attempt to get OOB; telesitter ordered, may need veil bed. Baseline behaviors per mom.   5. Neuropsych/cognition: This patient is not capable of making decisions on her own behalf.  -  7/22: Started on telesitter for OOB attempts; discussed with patient and mom possible veil bed if behaviors continue  6. Skin/Wound Care: Routine skin checks   - 7/15: Unstageable sacral PI; awaiting WC consult. Safety zone report completed.   Cleanse with Vashe [id number], not rinse, pat dry the peri-wound skin. Apply Xeroform on the wound bed daily. Cover with sacrum foam dressing changing every 3 days or PRN soiling.   7/22: Wound care, changing to Santyl  for debridement daily, then cover with moist 2 x 2 and foam dressing with dressing changes every 3 days or as needed.  Does seem to be getting smaller.  7. Fluids/Electrolytes/Nutrition: Routine in and outs with follow-up chemistries   - 7/15: Albumin 2.3; monitor PO intakes, Ensure BID. See #15 below.   - 7-21: No AM labs; will order for late today--mild hyponatremia, 132.  Other labs are stable.  Repeat in a.m, may have been secondary to Keflex  over the weekend which is now discontinued.  7-22: Hyponatremia-resolved.  8.  CAD/Non-STEMI/acute on chronic diastolic congestive heart failure.  Cardiac catheterization found to have chronic LAD.  Continue aspirin  therapy   - daily weights --not being performed, will reorder today [facility] Weights   09/05/23 0508  Weight: 51.6 kg    9.  Diabetes mellitus with peripheral neuropathy.  Hemoglobin A1c 4.8.  NovoLog  5 units 3 times daily, Semglee  12 units daily             -sugars under fair control at present--continue current regimen Recent Labs    09/08/23 1713 09/08/23 2124 09/09/23 0623  GLUCAP 113* 108* 134*     10.  Leukocytosis/UTI.  Antibiotic therapy completed 11.  Hyperammonemia.  Continue Chronulac  10 mg twice daily.  Follow-up ammonia levels             -last ammonia level 20 on 7/9  12.  Hypothyroidism.  Synthroid  13.  Documented CKD stage III.  Latest creatinine 0.95.  Follow-up chemistries.  Follow-up with [facility] nephrology  - BUN/creatinine have remained stable within her  normal range  14.  Chronic anemia.  Follow-up CBC. Stable 9-10.  Add iron supplement. 15.  Decreased nutritional storage.  Diet advanced to regular.  Follow-up dietary services             -intake poor/inconsistent             -engage RD for assistance   16.  COPD/tobacco use/marijuana use.  Continue inhalers as directed.  Check oxygen saturations every shift.  Urine drug screen negative.  Provide counseling   17. Urinary retention/relapsing UTI. Foley replaced 7/4. Awaiting WC consult as above for recs prior to removal--see above, DC foley trial started 7/15.    - 7/16 2x ISC overnight; 1x incontinent void; increase flomax  to 0.8 mg, start timed toiletting Q4-6 hours  - 7/17: Start bethenachol 10 mg TID for stimulation; get urinalysis  - 7-18: No improvement in urinary retention; urinalysis borderline but will opt to treat in light of symptoms.  Start Keflex  500 mg twice daily for 7 days.  Add on urine culture--follow-up on results- > 100K Klebsiealla p await sensitivities   7-21: Pan resistant Klebsiella pneumonia; .  With same species on 7-3 urine culture, was through course of meropenem  at that time.  Pharmacy started Invanz  1 g for 3 doses yesterday for ESBL; discussing with ID pharmacy given failure of their first treatment.  Will get renal ultrasound today to evaluate for structural causes of recurrence  7-22: Improving continence, continue current regimen.  Renal ultrasound with some mild left-sided hydronephrosis, no other significant findings.  Since she has resumed spontaneous urination, will monitor for now, may consider CT abdomen and pelvis if suspicion for obstructing stone  18. Constipation. Small, hard BM this AM. Add Miralax  daily, sennakot s 1 tab BID.  On Chronulac  10 mg twice daily.   - BM 7/16, large, with some rectal bleeding--add cream  -7-21: Multiple large, incontinent, liquid bowel movements overnight.  Likely secondary to antibiotics, DC daily Senokot,  Chronulac --continue miralax    19. L drop arm. ?RTC. Patient endorses fall 2 months ago with no workup. Will xray.   - 7/16: Xray w/o acute fractuire or dislocation - 7/22: Improved today with distraction. monitor  20. L calcified carotid plaque. CTA vs US  evaluation as OP.   21. Hx CVA with chronic R HP 2016 noted Left parietal on FLAIR and DWI but had a large DDX including CIN, PRES, was at [facility] center 06/14/14-06/24/14, given persistent of MRI finding, CVA is the cause  for R HP   7-21: Patient denies right hip pain on today's exam  22. AKI. Likely d/t dehydration, UTI.  - Give 1 L IVF normal saline at 100 cc/hr today. Repeat BMP in AM.  - Encourage PO fluids  22.  LOS: 8 days A FACE TO FACE EVALUATION WAS PERFORMED  Joesph JAYSON Likes 09/09/2023, 9:54 AM

## 2023-09-09 NOTE — Patient Care Conference (Signed)
 Inpatient RehabilitationTeam Conference and Plan of Care Update Date: 09/09/2023   Time: 1021 am    Patient Name: Sarah Davis      Medical Record Number: [id number]  Date of Birth: 12-20-1960 Sex: Female         Room/Bed: 4W13C/4W13C-01 Payor Info: Payor: [employer] / Plan: [employer]AN / Product Type: *No Product type* /    Admit Date/Time:  09/01/2023 12:49 PM  Primary Diagnosis:  Acute metabolic encephalopathy  Hospital Problems: Principal Problem:   Acute metabolic encephalopathy    Expected Discharge Date: Expected Discharge Date: 09/23/23  Team Members Present: Physician leading conference: Dr. 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 Mod A for UB/LB ADLs. Cognitive deficits limit attention, initiation, and sequencing. She is now not wanting to participate   CGA- min A overall   Cognitive retraining, ADLs, transfers, d/c planning    Mobility   CGA/minA bed mobility, minA/modA sit to stand transfer, modA gait x65' with RW, impulsive and lacks safety awareness   minA  increased participation, transfers, balance, ambulaiton, endurance, safety awareness    Communication   short phrase length   supervision   increasing verbalization of wants/needs, increasing phrase length    Safety/Cognition/ Behavioral Observations  mod-maxA   minA   sustained attention, problem solving, memory, awareness, orientation    Pain   No c/o pain   Remain pain free    Assess qshit and prn    Skin   Unstageable wound -sacral area Santyl  for debridement , cover with moist 2x2 and foam derssing Maintain skin integrity Assess qshift and prn      Discharge Planning:  Pt will d/c to her daughter's home and will have support with her daughter assisting. PCS services will be explored. SW will confirm there are no barriers to discharge.    Team Discussion: Patient was admitted post debility due to metabolic encephalopathy/ ICU myopathy. Patient with LUP:fziprjupnw adjusted by MD. Patient limited by impulsivity, profound cognitive deficits ,severe motor planning , apraxia and poor initiation. Patient with poor tolerance/behavior with therapy.    Patient on target to meet rehab goals: Currently patient still requires mod assistance for all ADLs and transfers. Patient able to ambulate up to 49' with mod assistance using a rolling walker. Patient needing mod-max assist with cognition and communication.  Overall goals at discharge are set for  supervision- min assistance.   *See Care Plan and progress notes for long and short-term goals.   Revisions to Treatment Plan:  Telesitter Full supervision with meals Encourage po fluids Psych consult WOC consult Air mattress Turn q 2 hours Dietician consult Wound care plan   Teaching Needs: Safety, medications, dietary modifications, toileting, transfers, etc.    Current Barriers to Discharge: Decreased caregiver support, Home enviroment access/layout, Incontinence, and Behavior  Possible Resolutions to Barriers: Family Education     Medical Summary Current Status: medically complicate by encephalopathy, diabetes, CKD stage III, anemia, protein-calorie malutrition, urinary retention/UTI, sacral wound, and anxiety  Barriers to Discharge: Behavior/Mood;Cardiac Complications;Complicated Wound;Medical stability;Renal Insufficiency/Failure;Self-care education;Uncontrolled Diabetes;Infection/IV Antibiotics    Possible Resolutions to [employer]: monitor labs and vitals, titrate diabetes regimen, treat UTI anmd continue bladder scans/timed toiletting and ISC as needed,  wound care consultation for sacral wound, monitor volume and respiratory status, restraints as needed for baseline impulsive behaviors and assistance from family with participation   Continued Need for Acute Rehabilitation Level of Care: The patient requires daily medical management by a physician with specialized training in physical medicine and rehabilitation for the following reasons: Direction of a multidisciplinary physical rehabilitation program to maximize functional independence : Yes Medical management of patient stability for increased activity during participation in an intensive rehabilitation regime.: Yes Analysis of laboratory values and/or radiology reports with any subsequent need for medication adjustment and/or medical intervention. : Yes   I attest that I was present, lead the team conference, and concur with the assessment and plan of the team.   Mehr Depaoli Gayo 09/09/2023, 1021 am

## 2023-09-10 LAB — BASIC METABOLIC PANEL WITH GFR
Anion gap: 12 (ref 5–15)
BUN: 27 mg/dL — ABNORMAL HIGH (ref 8–23)
CO2: 23 mmol/L (ref 22–32)
Calcium: 9.4 mg/dL (ref 8.9–10.3)
Chloride: 106 mmol/L (ref 98–111)
Creatinine, Ser: 0.94 mg/dL (ref 0.44–1.00)
GFR, Estimated: >60 mL/min (ref >60)
Glucose, Bld: 125 mg/dL — ABNORMAL HIGH (ref 70–99)
Potassium: 4 mmol/L (ref 3.5–5.1)
Sodium: 141 mmol/L (ref 135–145)

## 2023-09-10 LAB — GLUCOSE, CAPILLARY
Glucose-Capillary: 112 mg/dL — ABNORMAL HIGH (ref 70–99)
Glucose-Capillary: 154 mg/dL — ABNORMAL HIGH (ref 70–99)
Glucose-Capillary: 146 mg/dL — ABNORMAL HIGH (ref 70–99)
Glucose-Capillary: 139 mg/dL — ABNORMAL HIGH (ref 70–99)

## 2023-09-10 MED ORDER — QUETIAPINE FUMARATE 25 MG PO TABS
25.0000 mg | ORAL_TABLET | Freq: Two times a day (BID) | ORAL | Status: DC | PRN
  Administered 2023-09-13 – 2023-09-20 (×2): 25 mg via ORAL
  Filled 2023-09-10 (×2): qty 1

## 2023-09-10 MED ORDER — POLYETHYLENE GLYCOL 3350 17 G PO PACK
17.0000 g | PACK | Freq: Every day | ORAL | Status: DC | PRN
  Filled 2023-09-10: qty 1

## 2023-09-10 NOTE — Progress Notes (Signed)
 Speech Language Pathology Weekly Progress and Session Note  Patient Details  Name: Sarah Davis MRN: [id number] Date of Birth: 11-12-1960  Beginning of progress report period: September 02, 2023 End of progress report period: September 10, 2023  Short Term Goals: Week 1: SLP Short Term Goal 1 (Week 1): Pt will utilize calendar as needed to verbalize orientation information w/ modA SLP Short Term Goal 1 - Progress (Week 1): Met SLP Short Term Goal 2 (Week 1): Pt will utilize external memory aids as needed to recall recent information w/ 50% accuracy given maxA SLP Short Term Goal 2 - Progress (Week 1): Met SLP Short Term Goal 3 (Week 1): Pt will complete functional expression tasks w/ modA SLP Short Term Goal 3 - Progress (Week 1): Not met SLP Short Term Goal 4 (Week 1): Pt will sustain attention for 10 mins w/ maxA SLP Short Term Goal 4 - Progress (Week 1): Not met SLP Short Term Goal 5 (Week 1): Pt will improve initiation during functional and structured tasks to maxA SLP Short Term Goal 5 - Progress (Week 1): Met SLP Short Term Goal 6 (Week 1): Pt will solve simple problems during functional and structured cognitive tasks w/ maxA SLP Short Term Goal 6 - Progress (Week 1): Met    New Short Term Goals: Week 2: SLP Short Term Goal 1 (Week 2): Pt will utilize calendar as needed to verbalize orientation information w/ minA SLP Short Term Goal 2 (Week 2): Pt will utilize external memory aids as needed to recall recent information w/ 50% accuracy given modA SLP Short Term Goal 3 (Week 2): Pt will complete functional expression tasks w/ modA SLP Short Term Goal 4 (Week 2): Pt will sustain attention for 10 mins w/ maxA SLP Short Term Goal 5 (Week 2): Pt will improve initiation during functional and structured tasks to modA SLP Short Term Goal 6 (Week 2): Pt will solve simple problems during functional and structured cognitive tasks w/ modA  Weekly Progress Updates: Pt has made slight progress towards ST  goals, as she met 4/6 STGs this week. She continues to present w/ moderate to severe cognitive-linguistic deficits overall. SLP continues to target attention, initiation, communication of wants/needs, orientation, memory, and problem solving. She continues to require full supervision during meals d/t cognition, but tolerates a Dys 3 diet/thin liquids. Reduced participation and some behaviors noted this week, however, responds to encouragement/cueing from SLP at this time. Pt/family education ongoing and she would benefit from continued ST to target remaining deficits, maximize pt independence, and facilitate return to prev roles/responsibilities.   Intensity: Minumum of 1-2 x/day, 30 to 90 minutes Frequency: 3 to 5 out of 7 days Duration/Length of Stay: 8/5 Treatment/Interventions: Cognitive remediation/compensation;Functional tasks;Cueing hierarchy;Internal/external aids;Patient/family education;Speech/Language facilitation;Dysphagia/aspiration precaution training;Therapeutic Exercise   Recardo DELENA Mole 09/10/2023, 10:51 AM

## 2023-09-10 NOTE — Progress Notes (Signed)
 Physical Therapy Session Note  Patient Details  Name: Sarah Davis MRN: [id number] Date of Birth: [birth year]  Today's Date: 09/10/2023 PT Individual Time: 9051-8956 PT Individual Time Calculation (min): 55 min   Today's Date: 09/10/2023 PT Individual Time: 8695-8585 PT Individual Time Calculation (min): 70 min   Short Term Goals: Week 2:  PT Short Term Goal 1 (Week 2): Pt will complete sit to stand with CGA consistently. PT Short Term Goal 2 (Week 2): Pt will complete bed to chair with CGA consistently. PT Short Term Goal 3 (Week 2): Pt will ambulate x100' with minA and LRAD.  Skilled Therapeutic Interventions/Progress Updates:     1st Session: Pt received supine in bed and agrees to therapy. PT questions if pt need to be changed and pt says no. PT encourages pt to have brief changed anyway and pt is agreeable. Brief noted to be saturated with urine. Pt performs bilateral rolling with minA and cues for sequencing. Following pt performs bridge to assist with pulling up pants. Supine to sit with cues for positioning at EOB. Stand step to [facility] with CGA and cues for initiation and positioning. WC transport outside. Pt performs gait training outside for ambulation over unlevel and varying surfaces and utilizing therapeutic effects of natural light for pt's emotional well being and cognitive status. Pt ambulates 2x50' without AD, with minA at trunk and verbal and tactile cueing for upright posture to improve balance, and increasing step height and gait speed to decrease risk for falls. WC transport back inside. Pt transfers onto toilet with minA and is able to urinate and perform pericare with cues for initiation. Pt left seated in WC with all needs within reach   2nd Session: Pt received seated in [facility] and agrees to therapy. No complaint of pain. Pt encouraged to use restroom prior to therapy even though pt says she does not have to. Pt is eventually agreeable and performs ambulatory transfer to toilet  with minA and no AD. Pt is continent of urine in toilet and independent with pericare. Pt performs ambulatory transfer back to [facility] and cued to wash hands for hygiene. WC transport to gym. Pt performs sit to stand with CGA and cues for initiation, then ambulates to fatigue with +2 WC follow for safety. PT provides CGA/minA at trunk and pt able to ambulate x120' prior to requiring rest break. Pt the nambulates additional x80' with similar assistance and cues.   Pt performs repeated eccentric lowering of stand to sit to work on strength and motor control. Pt completes 2x5 with minA and verbal and tactile cues for NM feedback and correct performance. Extended seated rest breaks between bouts.  Pt transitions to supine with minA and cues for positioning. Pt completes supine therex for strengthening of lower extremities and core. Pt completes x10 bridges with 10 count hold on final rep. PT then adds green theraband to distal thighs to increase challenge and hip abductor recruitment. Pt completes 2x10 with green theraband. Following, pt completes 3x10 clamshells in hooklying with green theraband. ModA for sit to supine for urgency due to pt voicing need to urinate. Pt ambulates x130' back to room with minA/modA and same cues. Pt transfers to toilet with minA, able to urinate and perform pericae with cues for initiation, then ambulates back to bed with minA. Left supine with all needs within reach    Therapy Documentation Precautions:  Restrictions Weight Bearing Restrictions Per Provider Order: No    Therapy/Group: Individual Therapy  [name], PT,  DPT 09/10/2023, 5:00 PM

## 2023-09-10 NOTE — Plan of Care (Signed)
  Problem: Consults Goal: RH GENERAL PATIENT EDUCATION Description: See Patient Education module for education specifics. Outcome: Progressing   Problem: RH BLADDER ELIMINATION Goal: RH STG MANAGE BLADDER WITH ASSISTANCE Description: STG Manage Bladder With minimal Assistance Outcome: Progressing

## 2023-09-10 NOTE — Progress Notes (Signed)
 PROGRESS NOTE   Subjective/Complaints:  No events overnight.  No acute complaints.  Is continuing to refuse therapies, increasingly agitated with attempts to encourage participation. Vital stable.  With IV fluids overnight, creatinine normalized, BUN slightly up. Has remained continent of urine and now having some urinary frequency.  PVRs have remained low. Large, liquid bowel movement yesterday.   ROS: Denies fevers, chills, N/V, abdominal pain, constipation, diarrhea, SOB, cough, chest pain,   Objective:   US  RENAL Result Date: 09/08/2023 CLINICAL DATA:  Urinary retention. EXAM: RENAL / URINARY TRACT ULTRASOUND COMPLETE COMPARISON:  None Available. FINDINGS: Right Kidney: Renal measurements: 10.5 cm x 4.5 cm x 4.2 cm = volume: 103.0 mL. Echogenicity within normal limits. No mass or hydronephrosis visualized. Left Kidney: Renal measurements: 10.1 cm x 4.7 cm x 4.8 cm = volume: 117.2 mL. Echogenicity within normal limits. No mass is visualized. There is mild left-sided hydronephrosis. Bladder: Appears normal for degree of bladder distention. The bilateral ureteral jets are visualized. Other: None. IMPRESSION: Mild left-sided hydronephrosis. Electronically Signed   By: Suzen Dials M.D.   On: 09/08/2023 20:12    Recent Labs    09/09/23 0457  WBC 8.6  HGB 10.2*  HCT 32.4*  PLT 227    Recent Labs    09/09/23 0457 09/10/23 0440  NA 136 141  K 3.7 4.0  CL 103 106  CO2 24 23  GLUCOSE 130* 125*  BUN 23 27*  CREATININE 1.14* 0.94  CALCIUM  9.7 9.4     Intake/Output Summary (Last 24 hours) at 09/10/2023 0814 Last data filed at 09/10/2023 0800 Gross per 24 hour  Intake 840 ml  Output --  Net 840 ml        Physical Exam: Vital Signs Blood pressure (!) 125/58, pulse 68, temperature 98.5 F (36.9 C), temperature source Oral, resp. rate 16, height 5' (1.524 m), weight 51.6 kg, SpO2 98%.   General: No acute  distress. Laying in bed Mood and affect are appropriate Heart: Regular rate and rhythm no rubs murmurs or extra sounds Lungs: Clear to auscultation, breathing unlabored, no rales or wheezes Abdomen: Positive bowel sounds, soft nontender to palpation, nondistended Extremities: No clubbing, cyanosis, or edema Skin: No evidence of breakdown, no evidence of rash.  Unstageable buttocks with adherent slough; MASD surrounding--not examined 7-23    MSK:      No apparent deformity.  No apparent range of motion deficits.  Intermittent left shoulder abduction deficit.  Neurologic exam:  Cognition: Oriented to person, place, and time with cues.  Moderate to severe memory deficits, poor insight.  Ongoing. Mild dysarthria.  Mood: Flat affect, intermittently agitated Sensation: Equal and intact in BL UE and Les.  Reflexes: 2+ in BL UE and LEs. CN: 2-12 grossly intact.  Coordination: BL UE intention tremor.  Spasticity: MAS 0 in all extremities.  Strength: Moving all 4 extremities antigravity against resistance in bed, equally   Assessment/Plan: 1. Functional deficits which require 3+ hours per day of interdisciplinary therapy in a comprehensive inpatient rehab setting. Physiatrist is providing close team supervision and 24 hour management of active medical problems listed below. Physiatrist and rehab team continue to assess barriers to discharge/monitor patient  progress toward functional and medical goals  Care Tool:  Bathing    Body parts bathed by patient: Right arm, Left arm, Chest, Abdomen, Front perineal area, Right upper leg, Left upper leg, Face   Body parts bathed by helper: Buttocks     Bathing assist Assist Level: Moderate Assistance - Patient 50 - 74%     Upper Body Dressing/Undressing Upper body dressing   What is the patient wearing?: Pull over shirt    Upper body assist Assist Level: Set up assist    Lower Body Dressing/Undressing Lower body dressing      What is  the patient wearing?: Pants     Lower body assist Assist for lower body dressing: Contact Guard/Touching assist     Toileting Toileting    Toileting assist Assist for toileting: Maximal Assistance - Patient 25 - 49%     Transfers Chair/bed transfer  Transfers assist  Chair/bed transfer activity did not occur: Safety/medical concerns  Chair/bed transfer assist level: Minimal Assistance - Patient > 75%     Locomotion Ambulation   Ambulation assist      Assist level: Moderate Assistance - Patient 50 - 74% Assistive device: Walker-rolling Max distance: 65   Walk 10 feet activity   Assist     Assist level: Moderate Assistance - Patient - 50 - 74% Assistive device: Walker-rolling   Walk 50 feet activity   Assist Walk 50 feet with 2 turns activity did not occur: Safety/medical concerns  Assist level: Moderate Assistance - Patient - 50 - 74% Assistive device: Walker-rolling    Walk 150 feet activity   Assist Walk 150 feet activity did not occur: Safety/medical concerns         Walk 10 feet on uneven surface  activity   Assist Walk 10 feet on uneven surfaces activity did not occur: Safety/medical concerns         Wheelchair     Assist Is the patient using a wheelchair?: Yes Type of Wheelchair: Manual    Wheelchair assist level: Dependent - Patient 0% Max wheelchair distance: 150'    Wheelchair 50 feet with 2 turns activity    Assist        Assist Level: Dependent - Patient 0%   Wheelchair 150 feet activity     Assist      Assist Level: Dependent - Patient 0%   Blood pressure (!) 125/58, pulse 68, temperature 98.5 F (36.9 C), temperature source Oral, resp. rate 16, height 5' (1.524 m), weight 51.6 kg, SpO2 98%.  Medical Problem List and Plan: 1. 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 Severe motor planning and apraxia, impulsive ex. Tried to sit mid-air. Max A overall but can power up with Min A. Will need 24/7 Min A on discharge.   7/22: Apraxia, initiation and sequencing better. Gait limited by poor therapy tolerance/behaviors. D3 diet with thin liquids, full supervision.   7-23: Reduce therapies to daily due to poor tolerance; will discuss with family earlier discharge date given continued refusal  2.  Antithrombotics: -DVT/anticoagulation:  Pharmaceutical: Lovenox              -antiplatelet therapy: Aspirin  81 mg daily  3. Pain Management: Tylenol  as needed  4. Mood/Behavior/Sleep/bipolar  disorder II - poorly controlled: BuSpar  15 mg twice daily, Lamictal  titrating to 100 mg daily             -antipsychotic agents: N/A   - 7/15: Very flat affect and poor initiation per nursing; will need full SPV for meals per nursing. Continue current medication  titration - no mania, SI, HI  - Per psych consult inpatient: Will plan to slowly titrate lamictal  since she has not been taking it for at least 1 month and to prevent risk of side effects such as SJS. Will also plan to start trazodone  at a lower dose compared to her home dose of 400 mg in setting of preventing additional sedation, altered mental status, and since she had been sleeping well while in the hospital. We will defer to outpatient psychiatry for titrating trazodone  if indicated. She can follow-up with her outpatient psychiatric provider at [facility] upon discharge.  - ?resumption on invega 3 mg; will defer to OP  7-21: Patient's mother has asked be informed if she refuses therapies, do not give patient the option and she will be more receptive. 7/23: Refusing therapies intermittently, increasingly agitated with attempts to encourage.  Add Seroquel  25 mg twice daily as needed for agitation.   -  Invega last filled March 2025, would not restart at this time  before discussing reasoning/side effects with mother.  Move therapies to daily for tolerance.  5. Neuropsych/cognition: This patient is not capable of making decisions on her own behalf.  - 7/22: Started on telesitter for OOB attempts; discussed with patient and mom possible veil bed if behaviors continue  6. Skin/Wound Care: Routine skin checks   - 7/15: Unstageable sacral PI; awaiting WC consult. Safety zone report completed.   Cleanse with Vashe [id number], not rinse, pat dry the peri-wound skin. Apply Xeroform on the wound bed daily. Cover with sacrum foam dressing changing every 3 days or PRN soiling.   7/22: Wound care, changing to Santyl  for debridement daily, then cover with moist 2 x 2 and foam dressing with dressing changes every 3 days or as needed.  Does seem to be getting smaller.  7. Fluids/Electrolytes/Nutrition: Routine in and outs with follow-up chemistries   - 7/15: Albumin 2.3; monitor PO intakes, Ensure BID. See #15 below.   - 7-21: No AM labs; will order for late today--mild hyponatremia, 132.  Other labs are stable.  Repeat in a.m, may have been secondary to Keflex  over the weekend which is now discontinued.  7-22: Hyponatremia-resolved.  8.  CAD/Non-STEMI/acute on chronic diastolic congestive heart failure.  Cardiac catheterization found to have chronic LAD.  Continue aspirin  therapy   - daily weights --not being performed, will reorder today [facility] Weights   09/05/23 0508  Weight: 51.6 kg    9.  Diabetes mellitus with peripheral neuropathy.  Hemoglobin A1c 4.8.  NovoLog  5 units 3 times daily, Semglee  12 units daily             -sugars under fair control at present--continue current regimen Recent Labs    09/09/23 1702 09/09/23 2057 09/10/23 0634  GLUCAP 136* 134* 112*     10.  Leukocytosis/UTI.  Antibiotic therapy completed 11.  Hyperammonemia.  Continue Chronulac  10 mg twice daily.  Follow-up ammonia levels             -last ammonia level 20 on 7/9  12.   Hypothyroidism.  Synthroid  13.  Documented CKD stage III.  Latest creatinine 0.95.  Follow-up chemistries.  Follow-up with  [place] [facility]  - BUN/creatinine have remained stable within her normal range  14.  Chronic anemia.  Follow-up CBC. Stable 9-10.  Add iron supplement. 15.  Decreased nutritional storage.  Diet advanced to regular.  Follow-up dietary services             -intake poor/inconsistent             -engage RD for assistance   16.  COPD/tobacco use/marijuana use.  Continue inhalers as directed.  Check oxygen saturations every shift.  Urine drug screen negative.  Provide counseling   17. Urinary retention/relapsing UTI. Foley replaced 7/4. Awaiting WC consult as above for recs prior to removal--see above, DC foley trial started 7/15.    - 7/16 2x ISC overnight; 1x incontinent void; increase flomax  to 0.8 mg, start timed toiletting Q4-6 hours  - 7/17: Start bethenachol 10 mg TID for stimulation; get urinalysis  - 7-18: No improvement in urinary retention; urinalysis borderline but will opt to treat in light of symptoms.  Start Keflex  500 mg twice daily for 7 days.  Add on urine culture--follow-up on results- > 100K Klebsiealla p await sensitivities   7-21: Pan resistant Klebsiella pneumonia; .  With same species on 7-3 urine culture, was through course of meropenem  at that time.  Pharmacy started Invanz  1 g for 3 doses yesterday for ESBL; discussing with ID pharmacy given failure of their first treatment.  Will get renal ultrasound today to evaluate for structural causes of recurrence  7-22: Improving continence, continue current regimen.  Renal ultrasound with some mild left-sided hydronephrosis, no other significant findings.  Since she has resumed spontaneous urination, will monitor for now, may consider CT abdomen and pelvis if suspicion for obstructing stone  7-23: DC bethanechol ; continue PVRs, discussed with nursing  18. Constipation. Small, hard BM this AM. Add Miralax   daily, sennakot s 1 tab BID.  On Chronulac  10 mg twice daily.   - BM 7/16, large, with some rectal bleeding--add cream  -7-21: Multiple large, incontinent, liquid bowel movements overnight.  Likely secondary to antibiotics, DC daily Senokot, Chronulac --continue miralax   7-23: Continuing loose bowel movements, likely complicated by bethanechol ; move MiraLAX  as needed   19. L drop arm. ?RTC. Patient endorses fall 2 months ago with no workup. Will xray.   - 7/16: Xray w/o acute fractuire or dislocation - 7/22: Improved today with distraction. monitor  20. L calcified carotid plaque. CTA vs US  evaluation as OP.   21. Hx CVA with chronic R HP 2016 noted Left parietal on FLAIR and DWI but had a large DDX including CIN, PRES, was at [facility] 06/14/14-06/24/14, given persistent of MRI finding, CVA is the cause  for R HP   7-21: Patient denies right hip pain on today's exam  22. AKI. Likely d/t dehydration, UTI.  - Give 1 L IVF normal saline at 100 cc/hr today. Repeat BMP in AM.  - Encourage PO fluids - 7/23: Cr improved; can stop IV fluids.   LOS: 9 days A FACE TO FACE EVALUATION WAS PERFORMED  Joesph JAYSON Likes 09/10/2023, 8:14 AM

## 2023-09-10 NOTE — Progress Notes (Signed)
 Occupational Therapy Note  Patient Details  Name: Sarah Davis MRN: [id number] Date of Birth: 1960/06/12  Today's Date: 09/10/2023 OT Missed Time: 75 Minutes Missed Time Reason: Patient unwilling/refused to participate without medical reason  Patient is, again, unwilling to participate in any activity. Presented multiple options, utilized therapeutic use of self, and provided risks/benefits of not participating. Pt rude to OT and unwilling. This is the second full 75 min session pt has refused in a row- whenever her mother is not present. Need to revaluate appropriateness of remaining in OT if this continues. Will attempt to make up later if schedule allows and attempt to have pt scheduled later in the afternoon to ensure her mother is present.    [name] 09/10/2023, 7:49 AM

## 2023-09-11 ENCOUNTER — Inpatient Hospital Stay (HOSPITAL_COMMUNITY): Payer: MEDICAID

## 2023-09-11 LAB — GLUCOSE, CAPILLARY
Glucose-Capillary: 136 mg/dL — ABNORMAL HIGH (ref 70–99)
Glucose-Capillary: 181 mg/dL — ABNORMAL HIGH (ref 70–99)
Glucose-Capillary: 116 mg/dL — ABNORMAL HIGH (ref 70–99)
Glucose-Capillary: 156 mg/dL — ABNORMAL HIGH (ref 70–99)

## 2023-09-11 MED ORDER — TRAZODONE HCL 50 MG PO TABS
100.0000 mg | ORAL_TABLET | Freq: Every day | ORAL | Status: DC
  Administered 2023-09-11 – 2023-09-22 (×12): 100 mg via ORAL
  Filled 2023-09-11 (×12): qty 2

## 2023-09-11 MED ORDER — BETHANECHOL CHLORIDE 10 MG PO TABS
10.0000 mg | ORAL_TABLET | Freq: Three times a day (TID) | ORAL | Status: DC
  Administered 2023-09-11 – 2023-09-14 (×9): 10 mg via ORAL
  Filled 2023-09-11 (×9): qty 1

## 2023-09-11 NOTE — Progress Notes (Signed)
 Occupational Therapy Note  Patient Details  Name: Sarah Davis MRN: [id number] Date of Birth: 1960/10/06  Today's Date: 09/11/2023 OT Missed Time: 75 Minutes Missed Time Reason: Patient unwilling/refused to participate without medical reason  Pt donned pants with contact guard sit to stand and then refused to participate in any activity. Reporting she wanted to watch tv and would like me to leave. Attempted many ways to try to engage pt in therapeutic activity but pt admittedly declined. Missed 75 min  [name] Nest [facility] 09/11/2023, 10:43 AM

## 2023-09-11 NOTE — Progress Notes (Signed)
 Physical Therapy Session Note  Patient Details  Name: Sarah Davis MRN: [id number] Date of Birth: Mar 28, 1960  Today's Date: 09/11/2023 PT Individual Time: 9195-9084 PT Individual Time Calculation (min): 71 min   Short Term Goals: Week 2:  PT Short Term Goal 1 (Week 2): Pt will complete sit to stand with CGA consistently. PT Short Term Goal 2 (Week 2): Pt will complete bed to chair with CGA consistently. PT Short Term Goal 3 (Week 2): Pt will ambulate x100' with minA and LRAD.  Skilled Therapeutic Interventions/Progress Updates:     Pt received supine in bed and initially attempts to refuse therapy. Pt says she has not eaten breakfast. PT encourages pt to eat a few bits of breakfast and then participate in therapy and pt is agreeable. Pt performs supine to sit with cues for positioning at EOB. Pt eats breakfast from elevated bed to limit lower extremity support to engage core muscles and challenge balance. Following, pt ambulates to shower with minA and no AD, with cues for use of grab bars for safety and transitioning to shower chair. Pt able to doff clothes with cues for safety. Pt takes shower with minA for upper body cleaning, as well as cueing throughout for thoroughness and positioning for safety. Following, pt cued to stand and utilize grab bars to allow PT to dry backside and pt cued to dry front side and groin and buttocks. PT provides minA/modA for dressing. PT also assists with brushing hair and cueing pt for positioning at sink for safety. WC transport to gym. Pt stands and ambulates x120' with CGA/minA at trunk, with cues for upright posture and forward progression to promote balance and use of momentum. WC transport back to room. Pt left seated in WC with all needs within reach.   Therapy Documentation Precautions:  Restrictions Weight Bearing Restrictions Per Provider Order: No   Therapy/Group: Individual Therapy  [name], PT, DPT 09/11/2023, 5:03 PM

## 2023-09-11 NOTE — Progress Notes (Signed)
 Speech Language Pathology Daily Session Note  Patient Details  Name: Sarah Davis MRN: [id number] Date of Birth: 29-Oct-1960  Today's Date: 09/11/2023 SLP Individual Time: 1330-1430 SLP Individual Time Calculation (min): 60 min  Short Term Goals: Week 2: SLP Short Term Goal 1 (Week 2): Pt will utilize calendar as needed to verbalize orientation information w/ minA SLP Short Term Goal 2 (Week 2): Pt will utilize external memory aids as needed to recall recent information w/ 50% accuracy given modA SLP Short Term Goal 3 (Week 2): Pt will complete functional expression tasks w/ modA SLP Short Term Goal 4 (Week 2): Pt will sustain attention for 10 mins w/ maxA SLP Short Term Goal 5 (Week 2): Pt will improve initiation during functional and structured tasks to modA SLP Short Term Goal 6 (Week 2): Pt will solve simple problems during functional and structured cognitive tasks w/ modA  Skilled Therapeutic Interventions:   Pt and her mom greeted at bedside. She was up in her wheelchair after lunch. She was agreeable to tx tasks targeting cognition and language. She benefited from modA to recall events of the day thus far. She was independently oriented w/in 3 days. She also completed a written organization task targeting attention, organization, and initiation. She benefited from minA cues overall, but did require cue x2 to continue attempts vs stating I can't or I'm stuck. AShe was able to sustain attention for ~5 mins at a time throughout task. Also facilitated conversation re verbal problem solving and participation in therapies. [name] reported reduced participation w/ OT/ST vs PT, unable to ID reason at this time. She was able to write her first/last name, address, and DOB w/ s cues for accuracy. Improved legibility noted this date. Additionally, she completed a task unscrambling automatics w/ modA cues overall. Initially required maxA cues w/ months d/t impulsivity and inattention, however, improved  to minA w/ 1-20. At the end of tx tasks, she was left in her chair with the alarm set and call light within reach. Recommend cont ST per POC.    Pain  No pain reported  Therapy/Group: Individual Therapy  Recardo DELENA Mole 09/11/2023, 2:50 PM

## 2023-09-11 NOTE — Progress Notes (Signed)
 PROGRESS NOTE   Subjective/Complaints:  No events overnight.  No acute complaints.  Mom at bedside this a.m., states she did not refuse therapies today. Started retaining again overnight, cath twice for 805 100 cc.  Resume bethanechol  today.  Patient denies any personal history of renal stones, but strong family history of this.  Is not having any dysuria, is having some trouble starting urinary stream still.  Agreeable to renal CT today.   ROS: Denies fevers, chills, N/V, abdominal pain, constipation, diarrhea, SOB, cough, chest pain, + Urinary retention  Objective:   No results found.   Recent Labs    09/09/23 0457  WBC 8.6  HGB 10.2*  HCT 32.4*  PLT 227    Recent Labs    09/09/23 0457 09/10/23 0440  NA 136 141  K 3.7 4.0  CL 103 106  CO2 24 23  GLUCOSE 130* 125*  BUN 23 27*  CREATININE 1.14* 0.94  CALCIUM  9.7 9.4     Intake/Output Summary (Last 24 hours) at 09/11/2023 1025 Last data filed at 09/11/2023 0912 Gross per 24 hour  Intake 1216 ml  Output 1300 ml  Net -84 ml        Physical Exam: Vital Signs Blood pressure (!) 104/53, pulse 78, temperature 98.1 F (36.7 C), resp. rate 18, height 5' (1.524 m), weight 51.6 kg, SpO2 97%.   General: No acute distress. Laying in bed Mood and affect are appropriate Heart: Regular rate and rhythm no rubs murmurs or extra sounds Lungs: Clear to auscultation, breathing unlabored, no rales or wheezes Abdomen: Positive bowel sounds, soft nontender to palpation, nondistended Extremities: No clubbing, cyanosis, or edema Skin: No evidence of breakdown, no evidence of rash.  Unstageable buttocks with adherent slough; MASD surrounding--not examined today.    MSK:      No apparent deformity.  No apparent range of motion deficits.  Intermittent left shoulder abduction deficit.  Neurologic exam:  Cognition: Oriented to person, place, and time with cues.  Moderate  to severe memory deficits, poor insight.  Ongoing. Mild dysarthria.  Mood: Flat affect, intermittently agitated Sensation: Equal and intact in BL UE and Les.  Reflexes: 2+ in BL UE and LEs. CN: 2-12 grossly intact.  Coordination: BL UE intention tremor.  Spasticity: MAS 0 in all extremities.  Strength: Moving all 4 extremities antigravity against resistance in bed, equally  Physical exam unchanged from the above on reexamination 09/11/23   Assessment/Plan: 1. Functional deficits which require 3+ hours per day of interdisciplinary therapy in a comprehensive inpatient rehab setting. Physiatrist is providing close team supervision and 24 hour management of active medical problems listed below. Physiatrist and rehab team continue to assess barriers to discharge/monitor patient progress toward functional and medical goals  Care Tool:  Bathing    Body parts bathed by patient: Right arm, Left arm, Chest, Abdomen, Front perineal area, Right upper leg, Left upper leg, Face   Body parts bathed by helper: Buttocks     Bathing assist Assist Level: Moderate Assistance - Patient 50 - 74%     Upper Body Dressing/Undressing Upper body dressing   What is the patient wearing?: Pull over shirt    Upper  body assist Assist Level: Set up assist    Lower Body Dressing/Undressing Lower body dressing      What is the patient wearing?: Pants     Lower body assist Assist for lower body dressing: Contact Guard/Touching assist     Toileting Toileting    Toileting assist Assist for toileting: Maximal Assistance - Patient 25 - 49%     Transfers Chair/bed transfer  Transfers assist  Chair/bed transfer activity did not occur: Safety/medical concerns  Chair/bed transfer assist level: Minimal Assistance - Patient > 75%     Locomotion Ambulation   Ambulation assist      Assist level: Minimal Assistance - Patient > 75% Assistive device: No Device Max distance: 130'   Walk 10 feet  activity   Assist     Assist level: Minimal Assistance - Patient > 75% Assistive device: No Device   Walk 50 feet activity   Assist Walk 50 feet with 2 turns activity did not occur: Safety/medical concerns  Assist level: Minimal Assistance - Patient > 75% Assistive device: No Device    Walk 150 feet activity   Assist Walk 150 feet activity did not occur: Safety/medical concerns         Walk 10 feet on uneven surface  activity   Assist Walk 10 feet on uneven surfaces activity did not occur: Safety/medical concerns         Wheelchair     Assist Is the patient using a wheelchair?: Yes Type of Wheelchair: Manual    Wheelchair assist level: Dependent - Patient 0% Max wheelchair distance: 150'    Wheelchair 50 feet with 2 turns activity    Assist        Assist Level: Dependent - Patient 0%   Wheelchair 150 feet activity     Assist      Assist Level: Dependent - Patient 0%   Blood pressure (!) 104/53, pulse 78, temperature 98.1 F (36.7 C), resp. rate 18, height 5' (1.524 m), weight 51.6 kg, SpO2 97%.  Medical Problem List and Plan: 1. 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 Severe motor planning and apraxia, impulsive ex. Tried to sit mid-air. Max A overall but can power up with Min A. Will need 24/7 Min A on discharge.   7/22: Apraxia, initiation and sequencing better. Gait limited by poor therapy tolerance/behaviors. D3 diet with thin liquids, full supervision.   7-23: Reduce therapies to daily due to poor tolerance; will discuss with family earlier discharge date given continued refusal--per patient's mother, she will participate as long as she is there, no plan to move date at this time  2.  Antithrombotics: -DVT/anticoagulation:  Pharmaceutical:  Lovenox              -antiplatelet therapy: Aspirin  81 mg daily  3. Pain Management: Tylenol  as needed  4. Mood/Behavior/Sleep/bipolar disorder II - poorly controlled: BuSpar  15 mg twice daily, Lamictal  titrating to 100 mg daily             -antipsychotic agents: N/A   - 7/15: Very flat affect and poor initiation per nursing; will need full SPV for meals per nursing. Continue current medication  titration - no mania, SI, HI  - Per psych consult inpatient: Will  plan to slowly titrate lamictal  since she has not been taking it for at least 1 month and to prevent risk of side effects such as SJS. Will also plan to start trazodone  at a lower dose compared to her home dose of 400 mg in setting of preventing additional sedation, altered mental status, and since she had been sleeping well while in the hospital. We will defer to outpatient psychiatry for titrating trazodone  if indicated. She can follow-up with her outpatient psychiatric provider at [facility] upon discharge.  - ?resumption on invega 3 mg; will defer to OP  7-21: Patient's mother has asked be informed if she refuses therapies, do not give patient the option and she will be more receptive. 7/23: Refusing therapies intermittently, increasingly agitated with attempts to encourage.  Add Seroquel  25 mg twice daily as needed for agitation.   -  Invega last filled March 2025, would not restart at this time before discussing reasoning/side effects with mother. --They are unsure about this medication - Move therapies to daily for tolerance.  7-24: Increase trazodone  to 100 mg nightly  5. Neuropsych/cognition: This patient is not capable of making decisions on her own behalf.  - 7/22: Started on telesitter for OOB attempts; discussed with patient and mom possible veil bed if behaviors continue  6. Skin/Wound Care: Routine skin checks   - 7/15: Unstageable sacral PI; awaiting WC consult. Safety zone report completed.   Cleanse with Vashe  [id number], not rinse, pat dry the peri-wound skin. Apply Xeroform on the wound bed daily. Cover with sacrum foam dressing changing every 3 days or PRN soiling.   7/22: Wound care, changing to Santyl  for debridement daily, then cover with moist 2 x 2 and foam dressing with dressing changes every 3 days or as needed.  Does seem to be getting smaller.  7. Fluids/Electrolytes/Nutrition: Routine in and outs with follow-up chemistries   - 7/15: Albumin 2.3; monitor PO intakes, Ensure BID. See #15 below.   - 7-21: No AM labs; will order for late today--mild hyponatremia, 132.  Other labs are stable.  Repeat in a.m, may have been secondary to Keflex  over the weekend which is now discontinued.  7-22: Hyponatremia-resolved.  8.  CAD/Non-STEMI/acute on chronic diastolic congestive heart failure.  Cardiac catheterization found to have chronic LAD.  Continue aspirin  therapy   - daily weights --not being performed, will reorder today Filed Weights    9.  Diabetes mellitus with peripheral neuropathy.  Hemoglobin A1c 4.8.  NovoLog  5 units 3 times daily, Semglee  12 units daily             -sugars under fair control at present--continue current regimen Recent Labs    09/10/23 1659 09/10/23 2054 09/11/23 0552  GLUCAP 146* 139* 136*     10.  Leukocytosis/UTI.  Antibiotic therapy completed 11.  Hyperammonemia.  Continue Chronulac  10 mg twice daily.  Follow-up ammonia levels             -last ammonia level 20 on 7/9  12.  Hypothyroidism.  Synthroid  13.  Documented CKD stage III.  Latest creatinine 0.95.  Follow-up chemistries.  Follow-up with [facility] nephrology  - BUN/creatinine have remained stable within her normal range  14.  Chronic anemia.  Follow-up CBC. Stable 9-10.  Add iron supplement. 15.  Decreased nutritional storage.  Diet advanced to regular.  Follow-up dietary services             -intake poor/inconsistent             -  engage RD for assistance   16.  COPD/tobacco use/marijuana use.   Continue inhalers as directed.  Check oxygen saturations every shift.  Urine drug screen negative.  Provide counseling   17. Urinary retention/relapsing UTI. Foley replaced 7/4. Awaiting WC consult as above for recs prior to removal--see above, DC foley trial started 7/15.    - 7/16 2x ISC overnight; 1x incontinent void; increase flomax  to 0.8 mg, start timed toiletting Q4-6 hours  - 7/17: Start bethenachol 10 mg TID for stimulation; get urinalysis  - 7-18: No improvement in urinary retention; urinalysis borderline but will opt to treat in light of symptoms.  Start Keflex  500 mg twice daily for 7 days.  Add on urine culture--follow-up on results- > 100K Klebsiealla p await sensitivities   7-21: Pan resistant Klebsiella pneumonia; .  With same species on 7-3 urine culture, was through course of meropenem  at that time.  Pharmacy started Invanz  1 g for 3 doses yesterday for ESBL; discussing with ID pharmacy given failure of their first treatment.  Will get renal ultrasound today to evaluate for structural causes of recurrence  7-22: Improving continence, continue current regimen.  Renal ultrasound with some mild left-sided hydronephrosis, no other significant findings.  Since she has resumed spontaneous urination, will monitor for now, may consider CT abdomen and pelvis if suspicion for obstructing stone  7-23: DC bethanechol ; continue PVRs, discussed with nursing  7-24: Started retaining again overnight, resume bethanechol  10 mg 3 times daily.  Will get CT renal study today to evaluate.  18. Constipation. Small, hard BM this AM. Add Miralax  daily, sennakot s 1 tab BID.  On Chronulac  10 mg twice daily.   - BM 7/16, large, with some rectal bleeding--add cream  -7-21: Multiple large, incontinent, liquid bowel movements overnight.  Likely secondary to antibiotics, DC daily Senokot, Chronulac --continue miralax   7-23: Continuing loose bowel movements, likely complicated by bethanechol ; move MiraLAX  as needed    19. L drop arm. ?RTC. Patient endorses fall 2 months ago with no workup. Will xray.   - 7/16: Xray w/o acute fractuire or dislocation - 7/22: Improved today with distraction. monitor  20. L calcified carotid plaque. CTA vs US  evaluation as OP.   21. Hx CVA with chronic R HP 2016 noted Left parietal on FLAIR and DWI but had a large DDX including CIN, PRES, was at [facility] center 06/14/14-06/24/14, given persistent of MRI finding, CVA is the cause  for R HP   7-21: Patient denies right hip pain on today's exam  22. AKI. Likely d/t dehydration, UTI.  - Give 1 L IVF normal saline at 100 cc/hr today. Repeat BMP in AM.  - Encourage PO fluids - 7/23: Cr improved; can stop IV fluids.   LOS: 10 days A FACE TO FACE EVALUATION WAS PERFORMED  Joesph JAYSON Likes 09/11/2023, 10:25 AM

## 2023-09-12 LAB — GLUCOSE, CAPILLARY
Glucose-Capillary: 117 mg/dL — ABNORMAL HIGH (ref 70–99)
Glucose-Capillary: 186 mg/dL — ABNORMAL HIGH (ref 70–99)
Glucose-Capillary: 137 mg/dL — ABNORMAL HIGH (ref 70–99)
Glucose-Capillary: 112 mg/dL — ABNORMAL HIGH (ref 70–99)

## 2023-09-12 NOTE — Plan of Care (Signed)
  Problem: Consults Goal: RH GENERAL PATIENT EDUCATION Description: See Patient Education module for education specifics. Outcome: Progressing   Problem: RH BOWEL ELIMINATION Goal: RH STG MANAGE BOWEL WITH ASSISTANCE Description: STG Manage Bowel with minimal Assistance. Outcome: Progressing   Problem: RH BLADDER ELIMINATION Goal: RH STG MANAGE BLADDER WITH ASSISTANCE Description: STG Manage Bladder With minimal Assistance Outcome: Progressing   Problem: RH SKIN INTEGRITY Goal: RH STG SKIN FREE OF INFECTION/BREAKDOWN Description: Manage skin free of infection/breakdown with minimal assistance Outcome: Progressing   Problem: RH SAFETY Goal: RH STG ADHERE TO SAFETY PRECAUTIONS W/ASSISTANCE/DEVICE Description: STG Adhere to Safety Precautions With minimal Assistance/Device. Outcome: Progressing   Problem: RH PAIN MANAGEMENT Goal: RH STG PAIN MANAGED AT OR BELOW PT'S PAIN GOAL Description: < 4 w/prns Outcome: Progressing   Problem: RH KNOWLEDGE DEFICIT GENERAL Goal: RH STG INCREASE KNOWLEDGE OF SELF CARE AFTER HOSPITALIZATION Description: Manage increase knowledge of self care after hospitalization with minimal assistance from family using educational materials provided Outcome: Progressing

## 2023-09-12 NOTE — Progress Notes (Signed)
 Occupational Therapy Session Note  Patient Details  Name: Sarah Davis MRN: [id number] Date of Birth: [birth year]  Today's Date: 09/12/2023 OT Individual Time: 9264-9169 OT Individual Time Calculation (min): 55 min  and Today's Date: 09/12/2023 OT Missed Time: 20 Minutes Missed Time Reason: Patient fatigue;Patient unwilling/refused to participate without medical reason   Short Term Goals: Week 2:  OT Short Term Goal 1 (Week 2): Pt will complete LB dressing with min A OT Short Term Goal 2 (Week 2): Pt will complete ADLs at shower level no more than min cueing for sequencing and initiation OT Short Term Goal 3 (Week 2): Pt will complete stand pivot to the [facility] with CGA OT Short Term Goal 4 (Week 2): Pt will demo improved motor planning during ADLs by requiring no more than min cueing for sequencing  Skilled Therapeutic Interventions/Progress Updates:    Pt received supine with no c/o pain, agreeable to eat breakfast. She refused to come EOB to eat- raising voice to OT but OT able to deescalate and redirect to task. She was able to scoot herself up in the bed with min cueing and (S) using bed rails. She consumed several bites of breakfast with (S) before stating I'm through. Attempted to encourage more PO intake but she declined. Initiated toileting and pt was found to be heavily incontinent of urine- her entire back soaked. She was seemingly unaware/unbothered when questioned. She came to EOB with CGA using bed rail. Stand pivot to the [employer] with min HHA. She voided large amount of urine. She completed functional mobility into the bathroom with min HHA for shower transfer. Was able to use incontinence as indication for shower to get pt to participate. She completed bathing with direct cueing for initiation and sequencing. She stood with min A using grab bar and OT provided more thorough hygiene. She returned to the TIS w/c. She donned a shirt with (S), pants with CGA sit <> stand. She completed grooming  tasks at the sink with set up assist. She completed 100 ft of functional mobility with min HHA, completed to challenge dynamic standing balance and functional activity tolerance. She stated I feel like I'm going to fall out. Vitals assessed once back to room. Listed below. She returned to supine and politely declined more activity. Final 20 min of session missed. Improved participation today.   BP 118/70. HR 83.   Therapy Documentation Precautions:  Restrictions Weight Bearing Restrictions Per Provider Order: No   Therapy/Group: Individual Therapy  [name] 09/12/2023, 6:10 AM

## 2023-09-12 NOTE — Progress Notes (Signed)
 PROGRESS NOTE   Subjective/Complaints:  No events overnight.   No further straight caths needed.  CT renal showed some bladder wall thickening indicative of cystitis, some diverticulosis, no other acute findings.  She denies any renal symptoms today, feeling well.  No concerns, complaints.  ROS: Denies fevers, chills, N/V, abdominal pain, constipation, diarrhea, SOB, cough, chest pain,  Objective:   CT RENAL STONE STUDY Result Date: 09/11/2023 CLINICAL DATA:  Neurogenic bladder dysfunction retention. History of stones. EXAM: CT ABDOMEN AND PELVIS WITHOUT CONTRAST TECHNIQUE: Multidetector CT imaging of the abdomen and pelvis was performed following the standard protocol without IV contrast. RADIATION DOSE REDUCTION: This exam was performed according to the departmental dose-optimization program which includes automated exposure control, adjustment of the mA and/or kV according to patient size and/or use of iterative reconstruction technique. COMPARISON:  Renal ultrasound 09/08/2023. Contrast enhanced CT 05/26/2014 FINDINGS: Lower chest: Coronary artery calcifications. Breathing motion artifact with mild atelectasis in the dependent lower lobes. Hepatobiliary: Motion artifact through the liver. Allowing for this, no evidence of focal liver abnormality. Cholecystectomy with stable biliary prominence. Pancreas: Motion artifact through the pancreas, no obvious pancreatic inflammation. No ductal dilatation. Spleen: Normal in size without focal abnormality. Adrenals/Urinary Tract: Normal adrenal glands. Dilatation of the left renal pelvis may represent extrarenal pelvis configuration, there is no calyceal or ureteral distension. No renal or ureteral stones. Mild symmetric perinephric fat stranding. Nondistended urinary bladder which is thick walled. There is periventricular fat stranding. Stomach/Bowel: The stomach is distended with ingested material.  No bowel obstruction. Normal appendix visualized. Moderate volume of stool throughout the colon. Colonic diverticulosis. No diverticulitis. Vascular/Lymphatic: Aortic and branch atherosclerosis. No aortic aneurysm. Scattered retroperitoneal lymph nodes are not enlarged by size criteria. Reproductive: Unremarkable. Other: No free air, free fluid, or intra-abdominal fluid collection. Musculoskeletal: There are no acute or suspicious osseous abnormalities. Mild soft tissue thickening overlies the coccyx. No discrete fluid collection. IMPRESSION: 1. No renal or ureteral stones. 2. Nondistended urinary bladder which is thick walled with perivesicular fat stranding. Findings may represent cystitis. 3. Colonic diverticulosis without diverticulitis. Aortic Atherosclerosis (ICD10-I70.0). Electronically Signed   By: Andrea Gasman M.D.   On: 09/11/2023 19:11     No results for input(s): WBC, HGB, HCT, PLT in the last 72 hours.   Recent Labs    09/10/23 0440  NA 141  K 4.0  CL 106  CO2 23  GLUCOSE 125*  BUN 27*  CREATININE 0.94  CALCIUM  9.4     Intake/Output Summary (Last 24 hours) at 09/12/2023 1021 Last data filed at 09/12/2023 0700 Gross per 24 hour  Intake 840 ml  Output --  Net 840 ml        Physical Exam: Vital Signs Blood pressure 103/66, pulse 82, temperature 98.4 F (36.9 C), temperature source Oral, resp. rate 18, height 5' (1.524 m), weight 51.6 kg, SpO2 93%.   General: No acute distress. Laying in bed Mood and affect are appropriate Heart: Regular rate and rhythm no rubs murmurs or extra sounds Lungs: Clear to auscultation, breathing unlabored, no rales or wheezes Abdomen: Positive bowel sounds, soft nontender to palpation, nondistended Extremities: No clubbing, cyanosis, or edema Skin: No evidence of  breakdown, no evidence of rash.  Unstageable buttocks with adherent slough; MASD surrounding--not examined 7-25   MSK:      No apparent deformity.  No apparent  range of motion deficits.  Intermittent left shoulder abduction deficit.  Neurologic exam:  Cognition: Oriented to person, place, and time with cues.  +  Moderate to severe memory deficits, poor insight.  Ongoing. Mood: Flat affect, intermittently agitated Sensation: Equal and intact in BL UE and Les.  Reflexes: 2+ in BL UE and LEs. CN: 2-12 grossly intact.  Coordination: BL UE intention tremor.  Strength: Moving all 4 extremities antigravity against resistance in bed, equally  Physical exam unchanged from the above on reexamination 09/12/23     Assessment/Plan: 1. Functional deficits which require 3+ hours per day of interdisciplinary therapy in a comprehensive inpatient rehab setting. Physiatrist is providing close team supervision and 24 hour management of active medical problems listed below. Physiatrist and rehab team continue to assess barriers to discharge/monitor patient progress toward functional and medical goals  Care Tool:  Bathing    Body parts bathed by patient: Right arm, Left arm, Chest, Abdomen, Front perineal area, Right upper leg, Left upper leg, Face, Buttocks, Right lower leg, Left lower leg   Body parts bathed by helper: Buttocks     Bathing assist Assist Level: Minimal Assistance - Patient > 75%     Upper Body Dressing/Undressing Upper body dressing   What is the patient wearing?: Pull over shirt    Upper body assist Assist Level: Supervision/Verbal cueing    Lower Body Dressing/Undressing Lower body dressing      What is the patient wearing?: Pants     Lower body assist Assist for lower body dressing: Contact Guard/Touching assist     Toileting Toileting    Toileting assist Assist for toileting: Minimal Assistance - Patient > 75%     Transfers Chair/bed transfer  Transfers assist  Chair/bed transfer activity did not occur: Safety/medical concerns  Chair/bed transfer assist level: Minimal Assistance - Patient > 75%      Locomotion Ambulation   Ambulation assist      Assist level: Minimal Assistance - Patient > 75% Assistive device: No Device Max distance: 130'   Walk 10 feet activity   Assist     Assist level: Minimal Assistance - Patient > 75% Assistive device: No Device   Walk 50 feet activity   Assist Walk 50 feet with 2 turns activity did not occur: Safety/medical concerns  Assist level: Minimal Assistance - Patient > 75% Assistive device: No Device    Walk 150 feet activity   Assist Walk 150 feet activity did not occur: Safety/medical concerns         Walk 10 feet on uneven surface  activity   Assist Walk 10 feet on uneven surfaces activity did not occur: Safety/medical concerns         Wheelchair     Assist Is the patient using a wheelchair?: Yes Type of Wheelchair: Manual    Wheelchair assist level: Dependent - Patient 0% Max wheelchair distance: 150'    Wheelchair 50 feet with 2 turns activity    Assist        Assist Level: Dependent - Patient 0%   Wheelchair 150 feet activity     Assist      Assist Level: Dependent - Patient 0%   Blood pressure 103/66, pulse 82, temperature 98.4 F (36.9 C), temperature source Oral, resp. rate 18, height 5' (1.524 m), weight 51.6  kg, SpO2 93%.  Medical Problem List and Plan: 1. 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 Severe motor planning and apraxia, impulsive ex. Tried to sit mid-air. Max A overall but can power up with Min A. Will need 24/7 Min A on discharge.   7/22: Apraxia, initiation and sequencing better. Gait limited by poor therapy tolerance/behaviors. D3 diet with thin liquids, full supervision.   7-23: Reduce therapies to daily due to poor tolerance; will discuss with family earlier discharge date  given continued refusal--per patient's mother, she will participate as long as she is there, no plan to move date at this time  2.  Antithrombotics: -DVT/anticoagulation:  Pharmaceutical: Lovenox              -antiplatelet therapy: Aspirin  81 mg daily  3. Pain Management: Tylenol  as needed  4. Mood/Behavior/Sleep/bipolar disorder II - poorly controlled: BuSpar  15 mg twice daily, Lamictal  titrating to 100 mg daily             -antipsychotic agents: N/A   - 7/15: Very flat affect and poor initiation per nursing; will need full SPV for meals per nursing. Continue current medication  titration - no mania, SI, HI  - Per psych consult inpatient: Will plan to slowly titrate lamictal  since she has not been taking it for at least 1 month and to prevent risk of side effects such as SJS. Will also plan to start trazodone  at a lower dose compared to her home dose of 400 mg in setting of preventing additional sedation, altered mental status, and since she had been sleeping well while in the hospital. We will defer to outpatient psychiatry for titrating trazodone  if indicated. She can follow-up with her [facility] provider at [facility] upon discharge.  - ?resumption on invega 3 mg; will defer to OP  7-21: Patient's mother has asked be informed if she refuses therapies, do not give patient the option and she will be more receptive. 7/23: Refusing therapies intermittently, increasingly agitated with attempts to encourage.  Add Seroquel  25 mg twice daily as needed for agitation.   -  Invega last filled March 2025, would not restart at this time before discussing reasoning/side effects with mother. --They are unsure about this medication - Move therapies to daily for tolerance.  7-24: Increase trazodone  to 100 mg nightly--sleeping well  5. Neuropsych/cognition: This patient is not capable of making decisions on her own behalf.  - 7/22: Started on telesitter for OOB attempts; discussed with  patient and mom possible veil bed if behaviors continue--has not had any further issues  6. Skin/Wound Care: Routine skin checks   - 7/15: Unstageable sacral PI; awaiting WC consult. Safety zone report completed.   Cleanse with Vashe [id number], not rinse, pat dry the peri-wound skin. Apply Xeroform on the wound bed daily. Cover with sacrum foam dressing changing every 3 days or PRN soiling.   7/22: Wound care, changing to Santyl  for debridement daily, then cover with moist 2 x 2 and foam dressing with dressing changes every 3 days or as needed.  Does seem to be getting smaller.  7. Fluids/Electrolytes/Nutrition: Routine in and outs with follow-up chemistries   - 7/15: Albumin 2.3; monitor PO intakes, Ensure BID. See #15 below.   -  7-21: No AM labs; will order for late today--mild hyponatremia, 132.  Other labs are stable.  Repeat in a.m, may have been secondary to Keflex  over the weekend which is now discontinued.  7-22: Hyponatremia-resolved.  8.  CAD/Non-STEMI/acute on chronic diastolic congestive heart failure.  Cardiac catheterization found to have chronic LAD.  Continue aspirin  therapy   - daily weights --not being performed, will reorder today  -7 /25: Reminded nursing to please get daily weights Filed Weights    9.  Diabetes mellitus with peripheral neuropathy.  Hemoglobin A1c 4.8.  NovoLog  5 units 3 times daily, Semglee  12 units daily             -sugars under fair control at present--continue current regimen Recent Labs    09/11/23 1635 09/11/23 2100 09/12/23 0610  GLUCAP 116* 156* 117*     10.  Leukocytosis/UTI.  Antibiotic therapy completed 11.  Hyperammonemia.  Continue Chronulac  10 mg twice daily.  Follow-up ammonia levels             -last ammonia level 20 on 7/9  12.  Hypothyroidism.  Synthroid  13.  Documented CKD stage III.  Latest creatinine 0.95.  Follow-up chemistries.  Follow-up with [facility] nephrology  - BUN/creatinine have remained stable within her normal  range  14.  Chronic anemia.  Follow-up CBC. Stable 9-10.  Add iron supplement. 15.  Decreased nutritional storage.  Diet advanced to regular.  Follow-up dietary services             -intake poor/inconsistent             -engage RD for assistance   16.  COPD/tobacco use/marijuana use.  Continue inhalers as directed.  Check oxygen saturations every shift.  Urine drug screen negative.  Provide counseling   17. Urinary retention/relapsing UTI. Foley replaced 7/4. Awaiting WC consult as above for recs prior to removal--see above, DC foley trial started 7/15.    - 7/16 2x ISC overnight; 1x incontinent void; increase flomax  to 0.8 mg, start timed toiletting Q4-6 hours  - 7/17: Start bethenachol 10 mg TID for stimulation; get urinalysis  - 7-18: No improvement in urinary retention; urinalysis borderline but will opt to treat in light of symptoms.  Start Keflex  500 mg twice daily for 7 days.  Add on urine culture--follow-up on results- > 100K Klebsiealla p await sensitivities   7-21: Pan resistant Klebsiella pneumonia; .  With same species on 7-3 urine culture, was through course of meropenem  at that time.  Pharmacy started Invanz  1 g for 3 doses yesterday for ESBL; discussing with ID pharmacy given failure of their first treatment.  Will get renal ultrasound today to evaluate for structural causes of recurrence  7-22: Improving continence, continue current regimen.  Renal ultrasound with some mild left-sided hydronephrosis, no other significant findings.  Since she has resumed spontaneous urination, will monitor for now, may consider CT abdomen and pelvis if suspicion for obstructing stone  7-23: DC bethanechol ; continue PVRs, discussed with nursing  7-24: Started retaining again overnight, resume bethanechol  10 mg 3 times daily.  Will get CT renal study today to evaluate.  7-25: Symptoms resolved with bethanechol , CT study with some signs of cystitis but with recent UTI this could be residual.  No stones,  no other features concerning for bladder obstruction.  Would continue through today and then may try to wean to 5 mg 3 times daily over the weekend, but would not discontinue. - Will need outpatient urology follow-up  18.  Constipation. Small, hard BM this AM. Add Miralax  daily, sennakot s 1 tab BID.  On Chronulac  10 mg twice daily.   - BM 7/16, large, with some rectal bleeding--add cream  -7-21: Multiple large, incontinent, liquid bowel movements overnight.  Likely secondary to antibiotics, DC daily Senokot, Chronulac --continue miralax   7-23: Continuing loose bowel movements, likely complicated by bethanechol ; move MiraLAX  as needed  Last bowel movement 7-23   19. L drop arm. ?RTC. Patient endorses fall 2 months ago with no workup. Will xray.   - 7/16: Xray w/o acute fractuire or dislocation - 7/22: Improved today with distraction. monitor  20. L calcified carotid plaque. CTA vs US  evaluation as OP.   21. Hx CVA with chronic R HP 2016 noted Left parietal on FLAIR and DWI but had a large DDX including CIN, PRES, was at [facility] 06/14/14-06/24/14, given persistent of MRI finding, CVA is the cause  for R HP   7-21: Patient denies right hip pain on today's exam  22. AKI. Likely d/t dehydration, UTI.  - Give 1 L IVF normal saline at 100 cc/hr today. Repeat BMP in AM.  - Encourage PO fluids - 7/23: Cr improved; can stop IV fluids.   LOS: 11 days A FACE TO FACE EVALUATION WAS PERFORMED  Sarah Davis 09/12/2023, 10:21 AM

## 2023-09-12 NOTE — Progress Notes (Signed)
 Physical Therapy Session Note  Patient Details  Name: Sarah Davis MRN: [id number] Date of Birth: 01/15/1961  Today's Date: 09/12/2023 PT Individual Time: 8483-8371 PT Individual Time Calculation (min): 72 min   Short Term Goals: Week 2:  PT Short Term Goal 1 (Week 2): Pt will complete sit to stand with CGA consistently. PT Short Term Goal 2 (Week 2): Pt will complete bed to chair with CGA consistently. PT Short Term Goal 3 (Week 2): Pt will ambulate x100' with minA and LRAD.  Skilled Therapeutic Interventions/Progress Updates:     Pt received supine in bed and agrees to therapy. Reports pain in Rt arm during session. PT provides rest breaks as needed to manage pain. Pt performs supine to sit with bed features and cues for positioning at EOB. Pt performs sit to stand with minA Rt HHA for initiation and cues for anterior weight shifting. Pt ambulates x90' to dayroom with CGA/minA at trunk and cues for stability and navigation. Seated rest break. Pt performs Wii bowling activity to challenge balance, coordination, fine and gross motor skills, and ability to learn novel task. Pt has difficulty incorporating feedback on performance and eventually asks to move on from activity. Pt ambulates x175' with minA and cues for trunk stability and attemptint to increase gait speed, as well as encouraging pt to continue ambulating despite reports of fatigue shortly upon standing. Following extended seated rest break, pt ambulates additional x175' with similar assistance and cues. Pt then reports needing to use restroom and ambulates x100' to toilet, transitioning to toilet with minA and cues fo ruse of grab bars and safe positioning. Pt left on toilet with RN present.   Therapy Documentation Precautions:  Restrictions Weight Bearing Restrictions Per Provider Order: No   Therapy/Group: Individual Therapy  [name], PT, DPT 09/12/2023, 5:03 PM

## 2023-09-12 NOTE — Progress Notes (Signed)
 Speech Language Pathology Daily Session Note  Patient Details  Name: Sarah Davis MRN: [id number] Date of Birth: 26-Mar-1960  Today's Date: 09/12/2023 SLP Individual Time: 1330-1430 SLP Individual Time Calculation (min): 60 min  Short Term Goals: Week 2: SLP Short Term Goal 1 (Week 2): Pt will utilize calendar as needed to verbalize orientation information w/ minA SLP Short Term Goal 2 (Week 2): Pt will utilize external memory aids as needed to recall recent information w/ 50% accuracy given modA SLP Short Term Goal 3 (Week 2): Pt will complete functional expression tasks w/ modA SLP Short Term Goal 4 (Week 2): Pt will sustain attention for 10 mins w/ maxA SLP Short Term Goal 5 (Week 2): Pt will improve initiation during functional and structured tasks to modA SLP Short Term Goal 6 (Week 2): Pt will solve simple problems during functional and structured cognitive tasks w/ modA  Skilled Therapeutic Interventions:   Pt and her mom greeted in her room. She was up in her wheelchair upon SLP arrival and agreeable to tx tasks targeting cognition and language. She was able to recall the date independently and benefited from modA cues to recall events of the last few hours. She completed a verbal time management task w/ modA for information processing, working memory, and attention. She also benefited from [facility] for mildly specific naming task w/ category and letter. Initially required only minA, though success reduced to modA as task progressed. Anticipate motivation and attention negatively impacted success. Also attempted sentence formulation task, though task discontinued d/t perseverations on how many? as only sentence structure. During money management task calculating change totals, she benefited from maxA cues for working memory, attention to detail, and problem solving. She was able to sustain attention throughout ~10 min task w/ modA. At the end of tx tasks, she was left in her chair with the alarm  set and call light within reach. Recommend cont ST per POC.   Pain  No pain reported  Therapy/Group: Individual Therapy  Recardo DELENA Mole 09/12/2023, 4:10 PM

## 2023-09-13 DIAGNOSIS — G479 Sleep disorder, unspecified: Secondary | ICD-10-CM

## 2023-09-13 DIAGNOSIS — R339 Retention of urine, unspecified: Secondary | ICD-10-CM

## 2023-09-13 LAB — GLUCOSE, CAPILLARY
Glucose-Capillary: 154 mg/dL — ABNORMAL HIGH (ref 70–99)
Glucose-Capillary: 134 mg/dL — ABNORMAL HIGH (ref 70–99)
Glucose-Capillary: 128 mg/dL — ABNORMAL HIGH (ref 70–99)
Glucose-Capillary: 166 mg/dL — ABNORMAL HIGH (ref 70–99)
Glucose-Capillary: 67 mg/dL — ABNORMAL LOW (ref 70–99)
Glucose-Capillary: 69 mg/dL — ABNORMAL LOW (ref 70–99)
Glucose-Capillary: 83 mg/dL (ref 70–99)

## 2023-09-13 NOTE — Progress Notes (Signed)
 PROGRESS NOTE   Subjective/Complaints:  Pt slept ok. No problems reported overnight  ROS: Patient denies fever, rash, sore throat, blurred vision, dizziness, nausea, vomiting, diarrhea, cough, shortness of breath or chest pain, joint or back/neck pain, headache, or mood change.   Objective:   CT RENAL STONE STUDY Result Date: 09/11/2023 CLINICAL DATA:  Neurogenic bladder dysfunction retention. History of stones. EXAM: CT ABDOMEN AND PELVIS WITHOUT CONTRAST TECHNIQUE: Multidetector CT imaging of the abdomen and pelvis was performed following the standard protocol without IV contrast. RADIATION DOSE REDUCTION: This exam was performed according to the departmental dose-optimization program which includes automated exposure control, adjustment of the mA and/or kV according to patient size and/or use of iterative reconstruction technique. COMPARISON:  Renal ultrasound 09/08/2023. Contrast enhanced CT 05/26/2014 FINDINGS: Lower chest: Coronary artery calcifications. Breathing motion artifact with mild atelectasis in the dependent lower lobes. Hepatobiliary: Motion artifact through the liver. Allowing for this, no evidence of focal liver abnormality. Cholecystectomy with stable biliary prominence. Pancreas: Motion artifact through the pancreas, no obvious pancreatic inflammation. No ductal dilatation. Spleen: Normal in size without focal abnormality. Adrenals/Urinary Tract: Normal adrenal glands. Dilatation of the left renal pelvis may represent extrarenal pelvis configuration, there is no calyceal or ureteral distension. No renal or ureteral stones. Mild symmetric perinephric fat stranding. Nondistended urinary bladder which is thick walled. There is periventricular fat stranding. Stomach/Bowel: The stomach is distended with ingested material. No bowel obstruction. Normal appendix visualized. Moderate volume of stool throughout the colon. Colonic  diverticulosis. No diverticulitis. Vascular/Lymphatic: Aortic and branch atherosclerosis. No aortic aneurysm. Scattered retroperitoneal lymph nodes are not enlarged by size criteria. Reproductive: Unremarkable. Other: No free air, free fluid, or intra-abdominal fluid collection. Musculoskeletal: There are no acute or suspicious osseous abnormalities. Mild soft tissue thickening overlies the coccyx. No discrete fluid collection. IMPRESSION: 1. No renal or ureteral stones. 2. Nondistended urinary bladder which is thick walled with perivesicular fat stranding. Findings may represent cystitis. 3. Colonic diverticulosis without diverticulitis. Aortic Atherosclerosis (ICD10-I70.0). Electronically Signed   By: Andrea Gasman M.D.   On: 09/11/2023 19:11     No results for input(s): WBC, HGB, HCT, PLT in the last 72 hours.   No results for input(s): NA, K, CL, CO2, GLUCOSE, BUN, CREATININE, CALCIUM  in the last 72 hours.    Intake/Output Summary (Last 24 hours) at 09/13/2023 0923 Last data filed at 09/13/2023 0753 Gross per 24 hour  Intake 949 ml  Output --  Net 949 ml        Physical Exam: Vital Signs Blood pressure 118/66, pulse 65, temperature 98.7 F (37.1 C), temperature source Oral, resp. rate 17, height 5' (1.524 m), weight 52.8 kg, SpO2 98%.   Constitutional: No distress . Vital signs reviewed. HEENT: NCAT, EOMI, oral membranes moist Neck: supple Cardiovascular: RRR without murmur. No JVD    Respiratory/Chest: CTA Bilaterally without wheezes or rales. Normal effort    GI/Abdomen: BS +, non-tender, non-distended Ext: no clubbing, cyanosis, or edema Psych: pleasant and cooperative  Skin: No evidence of breakdown, no evidence of rash.  Unstageable buttocks with adherent slough; MASD surrounding--not examined 7-26   MSK:      No apparent deformity.  No  apparent range of motion deficits.  Intermittent left shoulder abduction deficit.  Neurologic exam:   Cognition: Oriented to person, place, and time with cues.  Persistent STM deficits. Limited insight and awareness. Mood: Flat affect, intermittently agitated Sensation: Equal and intact in BL UE and Les.  Reflexes: 2+ in BL UE and LEs. CN: 2-12 grossly intact.  Coordination: BL UE intention tremor.  Strength: Moving all 4 extremities at least 3+ to 4/5.   Physical exam unchanged from the above on reexamination 09/13/23     Assessment/Plan: 1. Functional deficits which require 3+ hours per day of interdisciplinary therapy in a comprehensive inpatient rehab setting. Physiatrist is providing close team supervision and 24 hour management of active medical problems listed below. Physiatrist and rehab team continue to assess barriers to discharge/monitor patient progress toward functional and medical goals  Care Tool:  Bathing    Body parts bathed by patient: Right arm, Left arm, Chest, Abdomen, Front perineal area, Right upper leg, Left upper leg, Face, Buttocks, Right lower leg, Left lower leg   Body parts bathed by helper: Buttocks     Bathing assist Assist Level: Minimal Assistance - Patient > 75%     Upper Body Dressing/Undressing Upper body dressing   What is the patient wearing?: Pull over shirt    Upper body assist Assist Level: Supervision/Verbal cueing    Lower Body Dressing/Undressing Lower body dressing      What is the patient wearing?: Pants     Lower body assist Assist for lower body dressing: Contact Guard/Touching assist     Toileting Toileting    Toileting assist Assist for toileting: Minimal Assistance - Patient > 75%     Transfers Chair/bed transfer  Transfers assist  Chair/bed transfer activity did not occur: Safety/medical concerns  Chair/bed transfer assist level: Minimal Assistance - Patient > 75%     Locomotion Ambulation   Ambulation assist      Assist level: Minimal Assistance - Patient > 75% Assistive device: No Device Max  distance: 130'   Walk 10 feet activity   Assist     Assist level: Minimal Assistance - Patient > 75% Assistive device: No Device   Walk 50 feet activity   Assist Walk 50 feet with 2 turns activity did not occur: Safety/medical concerns  Assist level: Minimal Assistance - Patient > 75% Assistive device: No Device    Walk 150 feet activity   Assist Walk 150 feet activity did not occur: Safety/medical concerns         Walk 10 feet on uneven surface  activity   Assist Walk 10 feet on uneven surfaces activity did not occur: Safety/medical concerns         Wheelchair     Assist Is the patient using a wheelchair?: Yes Type of Wheelchair: Manual    Wheelchair assist level: Dependent - Patient 0% Max wheelchair distance: 150'    Wheelchair 50 feet with 2 turns activity    Assist        Assist Level: Dependent - Patient 0%   Wheelchair 150 feet activity     Assist      Assist Level: Dependent - Patient 0%   Blood pressure 118/66, pulse 65, temperature 98.7 F (37.1 C), temperature source Oral, resp. rate 17, height 5' (1.524 m), weight 52.8 kg, SpO2 98%.  Medical Problem List and Plan: 1. 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 Severe motor planning and apraxia, impulsive ex. Tried to sit mid-air. Max A overall but can power up with Min A. Will need 24/7 Min A on discharge.   7/22: Apraxia, initiation and sequencing better. Gait limited by poor therapy tolerance/behaviors. D3 diet with thin liquids, full supervision.   7-23: Reduce therapies to daily due to poor tolerance; will discuss with family earlier discharge date given continued refusal--per patient's mother, she will participate as long as she is there, no plan to move date at this time  --Continue CIR therapies including PT,  OT, and SLP. Team to provide encouragement 2.  Antithrombotics: -DVT/anticoagulation:  Pharmaceutical: Lovenox              -antiplatelet therapy: Aspirin  81 mg daily  3. Pain Management: Tylenol  as needed  4. Mood/Behavior/Sleep/bipolar disorder II - poorly controlled: BuSpar  15 mg twice daily, Lamictal  titrating to 100 mg daily             -antipsychotic agents: N/A   - 7/15: Very flat affect and poor initiation per nursing; will need full SPV for meals per nursing. Continue current medication  titration - no mania, SI, HI  - Per psych consult inpatient: Will plan to slowly titrate lamictal  since she has not been taking it for at least 1 month and to prevent risk of side effects such as SJS. Will also plan to start trazodone  at a lower dose compared to her home dose of 400 mg in setting of preventing additional sedation, altered mental status, and since she had been sleeping well while in the hospital. We will defer to outpatient psychiatry for titrating trazodone  if indicated. She can follow-up with her [facility] provider [facility] upon discharge.  - ?resumption on invega 3 mg; will defer to OP  7-21: Patient's mother has asked be informed if she refuses therapies, do not give patient the option and she will be more receptive. 7/23: Refusing therapies intermittently, increasingly agitated with attempts to encourage.  Add Seroquel  25 mg twice daily as needed for agitation.   -  Invega last filled March 2025, would not restart at this time before discussing reasoning/side effects with mother. --They are unsure about this medication - Move therapies to daily for tolerance.  09-10-24: Increase trazodone  to 100 mg nightly--sleeping well  5. Neuropsych/cognition: This patient is not capable of making decisions on her own behalf.  - 7/22: Started on telesitter for OOB attempts; discussed with patient and mom possible veil bed if behaviors continue--has not had any further  issues  6. Skin/Wound Care: Routine skin checks   - 7/15: Unstageable sacral PI; awaiting WC consult. Safety zone report completed.   Cleanse with Vashe [id number], not rinse, pat dry the peri-wound skin. Apply Xeroform on the wound bed daily. Cover with sacrum foam dressing changing every 3 days or PRN soiling.   7/22: Wound care, changing to Santyl  for debridement daily, then cover with moist 2 x 2 and foam dressing with dressing changes every 3 days or as needed.  Does seem to be getting smaller.  7. Fluids/Electrolytes/Nutrition: Routine in and outs with follow-up chemistries   - 7/15: Albumin 2.3; monitor PO intakes, Ensure BID. See #15 below.   - 7-21: No AM labs; will order for late today--mild hyponatremia, 132.  Other labs are stable.  Repeat in a.m, may have been secondary  to Keflex  over the weekend which is now discontinued.  7-22: Hyponatremia-resolved.  8.  CAD/Non-STEMI/acute on chronic diastolic congestive heart failure.  Cardiac catheterization found to have chronic LAD.  Continue aspirin  therapy   - daily weights --not being performed, will reorder today  -7 /25: Reminded nursing to please get daily weights  7/26 no weight yet checked today--will remind again [facility] Weights   09/12/23 1832  Weight: 52.8 kg     9.  Diabetes mellitus with peripheral neuropathy.  Hemoglobin A1c 4.8.  NovoLog  5 units 3 times daily, Semglee  12 units daily             -7/26 sugars under fair control at present--no changes Recent Labs    09/12/23 1632 09/12/23 2107 09/13/23 0617  GLUCAP 137* 112* 154*     10.  Leukocytosis/UTI.  Antibiotic therapy completed 11.  Hyperammonemia.  Continue Chronulac  10 mg twice daily.  Follow-up ammonia levels             -last ammonia level 20 on 7/9  12.  Hypothyroidism.  Synthroid  13.  Documented CKD stage III.  Latest creatinine 0.95.  Follow-up chemistries.  Follow-up with [facility] nephrology  - BUN/creatinine have remained stable within her normal  range  14.  Chronic anemia.  Follow-up CBC. Stable 9-10.  Add iron supplement. 15.  Decreased nutritional storage.  Diet advanced to regular.  Follow-up dietary services             -intake poor/inconsistent             -engage RD for assistance   16.  COPD/tobacco use/marijuana use.  Continue inhalers as directed.  Check oxygen saturations every shift.  Urine drug screen negative.  Provide counseling   17. Urinary retention/relapsing UTI. Foley replaced 7/4. Awaiting WC consult as above for recs prior to removal--see above, DC foley trial started 7/15.    - 7/16 2x ISC overnight; 1x incontinent void; increase flomax  to 0.8 mg, start timed toiletting Q4-6 hours  - 7/17: Start bethenachol 10 mg TID for stimulation; get urinalysis  - 7-18: No improvement in urinary retention; urinalysis borderline but will opt to treat in light of symptoms.  Start Keflex  500 mg twice daily for 7 days.  Add on urine culture--follow-up on results- > 100K Klebsiealla p await sensitivities   7-21: Pan resistant Klebsiella pneumonia; .  With same species on 7-3 urine culture, was through course of meropenem  at that time.  Pharmacy started Invanz  1 g for 3 doses yesterday for ESBL; discussing with ID pharmacy given failure of their first treatment.  Will get renal ultrasound today to evaluate for structural causes of recurrence  7-22: Improving continence, continue current regimen.  Renal ultrasound with some mild left-sided hydronephrosis, no other significant findings.  Since she has resumed spontaneous urination, will monitor for now, may consider CT abdomen and pelvis if suspicion for obstructing stone  7-23: DC bethanechol ; continue PVRs, discussed with nursing  7-24: Started retaining again overnight, resume bethanechol  10 mg 3 times daily.  Will get CT renal study today to evaluate.  7-25: Symptoms resolved with bethanechol , CT study with some signs of cystitis but with recent UTI this could be residual.  No stones,  no other features concerning for bladder obstruction.  Would continue through today and then may try to wean to 5 mg 3 times daily over the weekend, but would not discontinue. - Will need outpatient urology follow-up  7/26 scanned yesterday for 426cc but then  went to bathroom--no scan afterwards. Consider decreasing ditropan tomorrow 18. Constipation. Small, hard BM this AM. Add Miralax  daily, sennakot s 1 tab BID.  On Chronulac  10 mg twice daily.   - BM 7/16, large, with some rectal bleeding--add cream  -7-21: Multiple large, incontinent, liquid bowel movements overnight.  Likely secondary to antibiotics, DC daily Senokot, Chronulac --continue miralax   7-23: Continuing loose bowel movements, likely complicated by bethanechol ; move MiraLAX  as needed  7/26 Last bowel movement 7-23--may need to resume a laxative   19. L drop arm. ?RTC. Patient endorses fall 2 months ago with no workup. Will xray.   - 7/16: Xray w/o acute fractuire or dislocation - 7/22: Improved today with distraction. monitor  20. L calcified carotid plaque. CTA vs US  evaluation as OP.   21. Hx CVA with chronic R HP 2016 noted Left parietal on FLAIR and DWI but had a large DDX including CIN, PRES, was at [facility] 06/14/14-06/24/14, given persistent of MRI finding, CVA is the cause  for R HP   7-21: Patient denies right hip pain on today's exam  22. AKI. Likely d/t dehydration, UTI.  - Give 1 L IVF normal saline at 100 cc/hr today. Repeat BMP in AM.  - Encourage PO fluids - 7/23: Cr improved; can stop IV fluids.   LOS: 12 days A FACE TO FACE EVALUATION WAS PERFORMED  Arthea ONEIDA Gunther 09/13/2023, 9:23 AM

## 2023-09-13 NOTE — Plan of Care (Signed)
  Problem: Consults Goal: RH GENERAL PATIENT EDUCATION Description: See Patient Education module for education specifics. Outcome: Progressing   Problem: RH BOWEL ELIMINATION Goal: RH STG MANAGE BOWEL WITH ASSISTANCE Description: STG Manage Bowel with minimal Assistance. Outcome: Progressing   Problem: RH BLADDER ELIMINATION Goal: RH STG MANAGE BLADDER WITH ASSISTANCE Description: STG Manage Bladder With minimal Assistance Outcome: Progressing   Problem: RH SKIN INTEGRITY Goal: RH STG SKIN FREE OF INFECTION/BREAKDOWN Description: Manage skin free of infection/breakdown with minimal assistance Outcome: Progressing   Problem: RH SAFETY Goal: RH STG ADHERE TO SAFETY PRECAUTIONS W/ASSISTANCE/DEVICE Description: STG Adhere to Safety Precautions With minimal Assistance/Device. Outcome: Progressing   Problem: RH PAIN MANAGEMENT Goal: RH STG PAIN MANAGED AT OR BELOW PT'S PAIN GOAL Description: < 4 w/prns Outcome: Progressing   Problem: RH KNOWLEDGE DEFICIT GENERAL Goal: RH STG INCREASE KNOWLEDGE OF SELF CARE AFTER HOSPITALIZATION Description: Manage increase knowledge of self care after hospitalization with minimal assistance from family using educational materials provided Outcome: Progressing

## 2023-09-14 LAB — GLUCOSE, CAPILLARY
Glucose-Capillary: 184 mg/dL — ABNORMAL HIGH (ref 70–99)
Glucose-Capillary: 156 mg/dL — ABNORMAL HIGH (ref 70–99)
Glucose-Capillary: 114 mg/dL — ABNORMAL HIGH (ref 70–99)
Glucose-Capillary: 111 mg/dL — ABNORMAL HIGH (ref 70–99)

## 2023-09-14 MED ORDER — BETHANECHOL CHLORIDE 10 MG PO TABS
5.0000 mg | ORAL_TABLET | Freq: Three times a day (TID) | ORAL | Status: DC
  Administered 2023-09-14 – 2023-09-16 (×6): 5 mg via ORAL
  Filled 2023-09-14 (×6): qty 1

## 2023-09-14 NOTE — Progress Notes (Addendum)
 PROGRESS NOTE   Subjective/Complaints:  Some concerns about boyfriend coming to visit yesterday. Family blocking him from coming. Pt slept well. No issue this morning.   ROS: Limited due to cognitive/behavioral   Objective:   No results found.    No results for input(s): WBC, HGB, HCT, PLT in the last 72 hours.   No results for input(s): NA, K, CL, CO2, GLUCOSE, BUN, CREATININE, CALCIUM  in the last 72 hours.    Intake/Output Summary (Last 24 hours) at 09/14/2023 0925 Last data filed at 09/14/2023 0852 Gross per 24 hour  Intake 1730 ml  Output --  Net 1730 ml        Physical Exam: Vital Signs Blood pressure (!) 105/57, pulse 85, temperature 98.6 F (37 C), temperature source Oral, resp. rate 16, height 5' (1.524 m), weight 52.8 kg, SpO2 94%.   Constitutional: No distress . Vital signs reviewed. HEENT: NCAT, EOMI, oral membranes moist Neck: supple Cardiovascular: RRR without murmur. No JVD    Respiratory/Chest: CTA Bilaterally without wheezes or rales. Normal effort    GI/Abdomen: BS +, non-tender, non-distended Ext: no clubbing, cyanosis, or edema Psych: pleasant and cooperative  Skin: No evidence of breakdown, no evidence of rash.  Unstageable buttocks with adherent slough; MASD surrounding--I did not examine today 7.27   MSK:      No apparent deformity.  No apparent range of motion deficits.  Intermittent left shoulder abduction deficit.  Neurologic exam:  Cognition: Oriented to person, place, and month with cues.  Persistent STM deficits. Limited insight and awareness. Mood: Flat affect, intermittently agitated Sensation: Equal and intact in BL UE and Les.  Reflexes: 2+ in BL UE and LEs. CN: 2-12 grossly intact.  Coordination: BL UE intention tremor.  Strength: Moving all 4 extremities at least 3+ to 4/5.   Neuro exam unchanged from the above on reexamination 09/14/23      Assessment/Plan: 1. Functional deficits which require 3+ hours per day of interdisciplinary therapy in a comprehensive inpatient rehab setting. Physiatrist is providing close team supervision and 24 hour management of active medical problems listed below. Physiatrist and rehab team continue to assess barriers to discharge/monitor patient progress toward functional and medical goals  Care Tool:  Bathing    Body parts bathed by patient: Right arm, Left arm, Chest, Abdomen, Front perineal area, Right upper leg, Left upper leg, Face, Buttocks, Right lower leg, Left lower leg   Body parts bathed by helper: Buttocks     Bathing assist Assist Level: Minimal Assistance - Patient > 75%     Upper Body Dressing/Undressing Upper body dressing   What is the patient wearing?: Pull over shirt    Upper body assist Assist Level: Supervision/Verbal cueing    Lower Body Dressing/Undressing Lower body dressing      What is the patient wearing?: Pants     Lower body assist Assist for lower body dressing: Contact Guard/Touching assist     Toileting Toileting    Toileting assist Assist for toileting: Minimal Assistance - Patient > 75%     Transfers Chair/bed transfer  Transfers assist  Chair/bed transfer activity did not occur: Safety/medical concerns  Chair/bed transfer assist level: Minimal  Assistance - Patient > 75%     Locomotion Ambulation   Ambulation assist      Assist level: Minimal Assistance - Patient > 75% Assistive device: No Device Max distance: 130'   Walk 10 feet activity   Assist     Assist level: Minimal Assistance - Patient > 75% Assistive device: No Device   Walk 50 feet activity   Assist Walk 50 feet with 2 turns activity did not occur: Safety/medical concerns  Assist level: Minimal Assistance - Patient > 75% Assistive device: No Device    Walk 150 feet activity   Assist Walk 150 feet activity did not occur: Safety/medical  concerns         Walk 10 feet on uneven surface  activity   Assist Walk 10 feet on uneven surfaces activity did not occur: Safety/medical concerns         Wheelchair     Assist Is the patient using a wheelchair?: Yes Type of Wheelchair: Manual    Wheelchair assist level: Dependent - Patient 0% Max wheelchair distance: 150'    Wheelchair 50 feet with 2 turns activity    Assist        Assist Level: Dependent - Patient 0%   Wheelchair 150 feet activity     Assist      Assist Level: Dependent - Patient 0%   Blood pressure (!) 105/57, pulse 85, temperature 98.6 F (37 C), temperature source Oral, resp. rate 16, height 5' (1.524 m), weight 52.8 kg, SpO2 94%.  Medical Problem List and Plan: 1. 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 Severe motor planning and apraxia, impulsive ex. Tried to sit mid-air. Max A overall but can power up with Min A. Will need 24/7 Min A on discharge.   7/22: Apraxia, initiation and sequencing better. Gait limited by poor therapy tolerance/behaviors. D3 diet with thin liquids, full supervision.   7-23: Reduce therapies to daily due to poor tolerance; will discuss with family earlier discharge date given continued refusal--per patient's mother, she will participate as long as she is there, no plan to move date at this time  -Continue CIR therapies including PT, OT, and SLP  2.  Antithrombotics: -DVT/anticoagulation:  Pharmaceutical: Lovenox              -antiplatelet therapy: Aspirin  81 mg daily  3. Pain Management: Tylenol  as needed  4. Mood/Behavior/Sleep/bipolar disorder II - poorly controlled: BuSpar  15 mg twice daily, Lamictal  titrating to 100 mg daily             -antipsychotic agents: N/A   - 7/15: Very flat affect and poor initiation per nursing; will need full  SPV for meals per nursing. Continue current medication  titration - no mania, SI, HI  - Per psych consult inpatient: Will plan to slowly titrate lamictal  since she has not been taking it for at least 1 month and to prevent risk of side effects such as SJS. Will also plan to start trazodone  at a lower dose compared to her home dose of 400 mg in setting of preventing additional sedation, altered mental status, and since she had been sleeping well while in the hospital. We will defer to outpatient psychiatry for titrating trazodone  if indicated. She can follow-up with her outpatient psychiatric  provider at [facility] upon discharge.  - ?resumption on invega 3 mg; will defer to OP  7-21: Patient's mother has asked be informed if she refuses therapies, do not give patient the option and she will be more receptive. 7/23: Refusing therapies intermittently, increasingly agitated with attempts to encourage.  Add Seroquel  25 mg twice daily as needed for agitation.   -  Invega last filled March 2025, would not restart at this time before discussing reasoning/side effects with mother. --They are unsure about this medication - Move therapies to daily for tolerance.  09-10-24: Increase trazodone  to 100 mg nightly--sleeping well  5. Neuropsych/cognition: This patient is not capable of making decisions on her own behalf.  - 7/22: Started on telesitter for OOB attempts; discussed with patient and mom possible veil bed if behaviors continue--has not had any further issues  6. Skin/Wound Care: Routine skin checks   - 7/15: Unstageable sacral PI; awaiting WC consult. Safety zone report completed.   Cleanse with Vashe [id number], not rinse, pat dry the peri-wound skin. Apply Xeroform on the wound bed daily. Cover with sacrum foam dressing changing every 3 days or PRN soiling.   7/22: Wound care, changing to Santyl  for debridement daily, then cover with moist 2 x 2 and foam dressing with dressing changes every 3 days or  as needed.    7. Fluids/Electrolytes/Nutrition: Routine in and outs with follow-up chemistries   - 7/15: Albumin 2.3; monitor PO intakes, Ensure BID. See #15 below.   - 7-21: No AM labs; will order for late today--mild hyponatremia, 132.  Other labs are stable.  Repeat in a.m, may have been secondary to Keflex  over the weekend which is now discontinued.  7-22: Hyponatremia-resolved.  8.  CAD/Non-STEMI/acute on chronic diastolic congestive heart failure.  Cardiac catheterization found to have chronic LAD.  Continue aspirin  therapy   - daily weights --not being performed, will reorder today  -7 /25: Reminded nursing to please get daily weights  7/27 still no weight checked. Will ask a 2nd time today Filed Weights   09/12/23 1832  Weight: 52.8 kg     9.  Diabetes mellitus with peripheral neuropathy.  Hemoglobin A1c 4.8.  NovoLog  5 units 3 times daily, Semglee  12 units daily             -7/27 cbg's under reasonable control with occasional spike. No changes today Recent Labs    09/13/23 2042 09/13/23 2120 09/14/23 0559  GLUCAP 69* 83 184*     10.  Leukocytosis/UTI.  Antibiotic therapy completed 11.  Hyperammonemia.  Continue Chronulac  10 mg twice daily.  Follow-up ammonia levels             -last ammonia level 20 on 7/9  12.  Hypothyroidism.  Synthroid  13.  Documented CKD stage III.  Latest creatinine 0.95.  Follow-up chemistries.  Follow-up with [facility] nephrology  - BUN/creatinine have remained stable within her normal range  14.  Chronic anemia.  Follow-up CBC. Stable 9-10.  Add iron supplement. 15.  Decreased nutritional storage.  Diet advanced to regular.  Follow-up dietary services             -intake poor/inconsistent             -engage RD for assistance   16.  COPD/tobacco use/marijuana use.  Continue inhalers as directed.  Check oxygen saturations every shift.  Urine drug screen negative.  Provide counseling   17. Urinary retention/relapsing UTI. Foley replaced 7/4.  Awaiting WC consult as  above for recs prior to removal--see above, DC foley trial started 7/15.    - 7/16 2x ISC overnight; 1x incontinent void; increase flomax  to 0.8 mg, start timed toiletting Q4-6 hours  - 7/17: Start bethenachol 10 mg TID for stimulation; get urinalysis  - 7-18: No improvement in urinary retention; urinalysis borderline but will opt to treat in light of symptoms.  Start Keflex  500 mg twice daily for 7 days.  Add on urine culture--follow-up on results- > 100K Klebsiealla p await sensitivities   7-21: Pan resistant Klebsiella pneumonia; .  With same species on 7-3 urine culture, was through course of meropenem  at that time.  Pharmacy started Invanz  1 g for 3 doses yesterday for ESBL; discussing with ID pharmacy given failure of their first treatment.  Will get renal ultrasound today to evaluate for structural causes of recurrence  7-22: Improving continence, continue current regimen.  Renal ultrasound with some mild left-sided hydronephrosis, no other significant findings.  Since she has resumed spontaneous urination, will monitor for now, may consider CT abdomen and pelvis if suspicion for obstructing stone  7-23: DC bethanechol ; continue PVRs, discussed with nursing  7-24: Started retaining again overnight, resume bethanechol  10 mg 3 times daily.  Will get CT renal study today to evaluate.  7-25: Symptoms resolved with bethanechol , CT study with some signs of cystitis but with recent UTI this could be residual.  No stones, no other features concerning for bladder obstruction.  Would continue through today and then may try to wean to 5 mg 3 times daily over the weekend, but would not discontinue. - Will need outpatient urology follow-up  7/26-27--incontinent. No retention reported but no scans done either.  -will reduce urecholine  to 5mg  tid 18. Constipation. Small, hard BM this AM. Add Miralax  daily, sennakot s 1 tab BID.  On Chronulac  10 mg twice daily.   - BM 7/16, large, with  some rectal bleeding--add cream  -7-21: Multiple large, incontinent, liquid bowel movements overnight.  Likely secondary to antibiotics, DC daily Senokot, Chronulac --continue miralax   7-23: Continuing loose bowel movements, likely complicated by bethanechol ; move MiraLAX  as needed  7/27--moved bowels yesterday, type 4   19. L drop arm. ?RTC. Patient endorses fall 2 months ago with no workup. Will xray.   - 7/16: Xray w/o acute fractuire or dislocation - 7/22: Improved today with distraction. monitor  20. L calcified carotid plaque. CTA vs US  evaluation as OP.   21. Hx CVA with chronic R HP 2016 noted Left parietal on FLAIR and DWI but had a large DDX including CIN, PRES, was at [facility] 06/14/14-06/24/14, given persistent of MRI finding, CVA is the cause  for R HP   7-21: Patient denies right hip pain on today's exam  22. AKI. Likely d/t dehydration, UTI.  - Give 1 L IVF normal saline at 100 cc/hr today. Repeat BMP in AM.  - Encourage PO fluids - 7/23: Cr improved; can stop IV fluids.  7/27 eating and drinking fairly well, not sure how accurate fluids are being tracked  LOS: 13 days A FACE TO FACE EVALUATION WAS PERFORMED  Sarah Davis 09/14/2023, 9:25 AM

## 2023-09-15 LAB — CBC WITH DIFFERENTIAL/PLATELET
Abs Immature Granulocytes: 0.06 K/uL (ref 0.00–0.07)
Basophils Absolute: 0.1 K/uL (ref 0.0–0.1)
Basophils Relative: 1 %
Eosinophils Absolute: 0.3 K/uL (ref 0.0–0.5)
Eosinophils Relative: 3 %
HCT: 36.2 % (ref 36.0–46.0)
Hemoglobin: 11.3 g/dL — ABNORMAL LOW (ref 12.0–15.0)
Immature Granulocytes: 1 %
Lymphocytes Relative: 29 %
Lymphs Abs: 2.5 K/uL (ref 0.7–4.0)
MCH: 28 pg (ref 26.0–34.0)
MCHC: 31.2 g/dL (ref 30.0–36.0)
MCV: 89.8 fL (ref 80.0–100.0)
Monocytes Absolute: 0.6 K/uL (ref 0.1–1.0)
Monocytes Relative: 7 %
Neutro Abs: 5.1 K/uL (ref 1.7–7.7)
Neutrophils Relative %: 59 %
Platelets: 324 K/uL (ref 150–400)
RBC: 4.03 MIL/uL (ref 3.87–5.11)
RDW: 16.7 % — ABNORMAL HIGH (ref 11.5–15.5)
WBC: 8.7 K/uL (ref 4.0–10.5)
nRBC: 0 % (ref 0.0–0.2)

## 2023-09-15 LAB — COMPREHENSIVE METABOLIC PANEL WITH GFR
ALT: 22 U/L (ref 0–44)
AST: 23 U/L (ref 15–41)
Albumin: 2.9 g/dL — ABNORMAL LOW (ref 3.5–5.0)
Alkaline Phosphatase: 93 U/L (ref 38–126)
Anion gap: 12 (ref 5–15)
BUN: 23 mg/dL (ref 8–23)
CO2: 25 mmol/L (ref 22–32)
Calcium: 9.9 mg/dL (ref 8.9–10.3)
Chloride: 101 mmol/L (ref 98–111)
Creatinine, Ser: 1.04 mg/dL — ABNORMAL HIGH (ref 0.44–1.00)
GFR, Estimated: >60 mL/min (ref >60)
Glucose, Bld: 208 mg/dL — ABNORMAL HIGH (ref 70–99)
Potassium: 4.2 mmol/L (ref 3.5–5.1)
Sodium: 138 mmol/L (ref 135–145)
Total Bilirubin: 0.4 mg/dL (ref 0.0–1.2)
Total Protein: 6.6 g/dL (ref 6.5–8.1)

## 2023-09-15 LAB — URINALYSIS, W/ REFLEX TO CULTURE (INFECTION SUSPECTED)
Bilirubin Urine: NEGATIVE
Glucose, UA: NEGATIVE mg/dL
Hgb urine dipstick: NEGATIVE
Ketones, ur: NEGATIVE mg/dL
Nitrite: NEGATIVE
Protein, ur: NEGATIVE mg/dL
Specific Gravity, Urine: 1.006 (ref 1.005–1.030)
pH: 7 (ref 5.0–8.0)

## 2023-09-15 LAB — GLUCOSE, CAPILLARY
Glucose-Capillary: 140 mg/dL — ABNORMAL HIGH (ref 70–99)
Glucose-Capillary: 189 mg/dL — ABNORMAL HIGH (ref 70–99)
Glucose-Capillary: 200 mg/dL — ABNORMAL HIGH (ref 70–99)
Glucose-Capillary: 206 mg/dL — ABNORMAL HIGH (ref 70–99)

## 2023-09-15 NOTE — Plan of Care (Signed)
  Problem: Consults Goal: RH GENERAL PATIENT EDUCATION Description: See Patient Education module for education specifics. Outcome: Progressing   Problem: RH BOWEL ELIMINATION Goal: RH STG MANAGE BOWEL WITH ASSISTANCE Description: STG Manage Bowel with minimal Assistance. Outcome: Progressing   Problem: RH BLADDER ELIMINATION Goal: RH STG MANAGE BLADDER WITH ASSISTANCE Description: STG Manage Bladder With minimal Assistance Outcome: Progressing   Problem: RH SKIN INTEGRITY Goal: RH STG SKIN FREE OF INFECTION/BREAKDOWN Description: Manage skin free of infection/breakdown with minimal assistance Outcome: Progressing   Problem: RH SAFETY Goal: RH STG ADHERE TO SAFETY PRECAUTIONS W/ASSISTANCE/DEVICE Description: STG Adhere to Safety Precautions With minimal Assistance/Device. Outcome: Progressing   Problem: RH PAIN MANAGEMENT Goal: RH STG PAIN MANAGED AT OR BELOW PT'S PAIN GOAL Description: < 4 w/prns Outcome: Progressing   Problem: RH KNOWLEDGE DEFICIT GENERAL Goal: RH STG INCREASE KNOWLEDGE OF SELF CARE AFTER HOSPITALIZATION Description: Manage increase knowledge of self care after hospitalization with minimal assistance from family using educational materials provided Outcome: Progressing

## 2023-09-15 NOTE — Progress Notes (Signed)
 Physical Therapy Session Note  Patient Details  Name: Sarah Davis MRN: [id number] Date of Birth: [birth year]  Today's Date: 09/15/2023 PT Individual Time: 9086-9045 PT Individual Time Calculation (min): 41 min   Short Term Goals: Week 2:  PT Short Term Goal 1 (Week 2): Pt will complete sit to stand with CGA consistently. PT Short Term Goal 2 (Week 2): Pt will complete bed to chair with CGA consistently. PT Short Term Goal 3 (Week 2): Pt will ambulate x100' with minA and LRAD.  Skilled Therapeutic Interventions/Progress Updates:      Pt supine in bed upon arrival. Pt agreeable to therapy. Pt transported dependent in WC to gym 2/2 floors being wet. Pt ambulated loop around day room x 168 feet with R HHA, pt ambulated 100 feet with no AD and CGA/min A, pt overall very guarded and fearful of walking without UE support, verbal cues provided for reciprocla arm swing.   Pt stood with no AD and CGA, while performing bilateral reaching at multiple heights while playing connect 4, verbal cues provided for attention for recognizing when pt has opportunity to win, however pt able to block therapist from getting 4 in a row without cues.   Pt performed stand pivot transfer with min A, verbal cues provided for technique.   Pt supine in bed at end of session with all needs wtihin reach and bed alarm on.   Therapy Documentation Precautions:  Restrictions Weight Bearing Restrictions Per Provider Order: No  Therapy/Group: Individual Therapy  [facility] Doreene Orris, [place], DPT  09/15/2023, 9:50 AM

## 2023-09-15 NOTE — Progress Notes (Signed)
 Physical Therapy Session Note  Patient Details  Name: Sarah Davis MRN: [id number] Date of Birth: Mar 30, 1960  Today's Date: 09/15/2023 PT Individual Time: 1503-1600 PT Individual Time Calculation (min): 57 min   Short Term Goals: Week 2:  PT Short Term Goal 1 (Week 2): Pt will complete sit to stand with CGA consistently. PT Short Term Goal 2 (Week 2): Pt will complete bed to chair with CGA consistently. PT Short Term Goal 3 (Week 2): Pt will ambulate x100' with minA and LRAD.  Skilled Therapeutic Interventions/Progress Updates:     Pt received supine in bed and agrees to therapy. No complaint of pain. Pt says it has been a bad day because [she] argued with mom about no participating. PT provides therapeutic use of self and gentle encouragement and pt agrees to therapy. Pt performs supine to sit with cues for positioning. Pt ambulates to toilet even though reports she does not need to use restroom. Upon sitting on toilet pt voids large amount from bladder and also has BM. Following, PT provides totalA for pericare. Pt ambulates to WC and PT assists with changing shirt and brief. Pt then washes hands with cueing and ambulates x90' to Nustep with CGA and cues for trunk rotation and arm swing to improve balance. Pt completes Nustep for endurancde and reciprocal coordination training. Pt completes x8:00 at workload of 4 with average steps per minute ~50. PT provides cues for hand and foot placement and completing full available ROM. Following rest break pt ambulates 2x175' with CGA/minA at trunk and similar cues and assistance provided. Pt then completes repetitions of sit to stand while holding onto 4lb bar to challenge dynamic balance and strength. Pt then performs bar lifts over head with minimal assistance for balance and safety. Pt completes 2x4 with seated rest break and cues for NM feedback and correct performance> pt ambulates back to room with same assistance and cues. Left supinein bed with all  needs within reach.   Therapy Documentation Precautions:  Restrictions Weight Bearing Restrictions Per Provider Order: No   Therapy/Group: Individual Therapy  [name], PT, DPT 09/15/2023, 4:59 PM

## 2023-09-15 NOTE — Progress Notes (Signed)
 Speech Language Pathology Daily Session Note  Patient Details  Name: Sarah Davis MRN: [id number] Date of Birth: 1960-07-07  Today's Date: 09/15/2023 SLP Individual Time: 1330-1400 SLP Individual Time Calculation (min): 30 min  Short Term Goals: Week 2: SLP Short Term Goal 1 (Week 2): Pt will utilize calendar as needed to verbalize orientation information w/ minA SLP Short Term Goal 2 (Week 2): Pt will utilize external memory aids as needed to recall recent information w/ 50% accuracy given modA SLP Short Term Goal 3 (Week 2): Pt will complete functional expression tasks w/ modA SLP Short Term Goal 4 (Week 2): Pt will sustain attention for 10 mins w/ maxA SLP Short Term Goal 5 (Week 2): Pt will improve initiation during functional and structured tasks to modA SLP Short Term Goal 6 (Week 2): Pt will solve simple problems during functional and structured cognitive tasks w/ modA  Skilled Therapeutic Interventions:   Pt and her mom greeted at bedside for tx tasks targeting cognition. She was up in her TIS wheelchair upon SLP arrival. Pt remains flat, but was initially agreeable to tx tasks targeting cognition. She was oriented to date independently. She completed only one trial of task comparing household items before becoming notably frustrated w/ SLP. Pt stated I can't stand this, I can't stand her, get the hell out of my room and leave me alone! among other things.  SLP utilized active listening and empathetic conversation in attempts to reduce frustration. She did not provide much additional information re frustration, but did express frustration re ongoing hospital stay. SLP provided continued education re remaining deficits, their negative impact on return to prev roles/responsibilities, continued need for assistance upon d/c. She did participate in final tx tasks targeting naming, and required only s cues d/t mildly specific nature of items. Anticipate increased success w/ this task was d/t  adequate effort after education from SLP. At the end of tx tasks, she was left in her [facility] with her mom present upon SLP departure. Recommend cont ST per POC.   Pain  No pain reported  Therapy/Group: Individual Therapy  Recardo DELENA Mole 09/15/2023, 2:18 PM

## 2023-09-15 NOTE — Progress Notes (Addendum)
 Patient ID: Sarah Davis, female   DOB: [birth year], 63 y.o.   MRN: [id number]  SW emailed PCS referral to insurance- [email or web address] .   D/c address: [address] [place], [place] [address].  Graeme Jude, MSW, LCSW Office: [phone] Cell: [phone] Fax: [phone]

## 2023-09-15 NOTE — Progress Notes (Signed)
 PROGRESS NOTE   Subjective/Complaints:  No events overnight.  No acute complaints. Vitals stable; blood pressure soft but stable    09/15/2023    4:45 AM 09/14/2023    7:59 PM 09/14/2023    2:14 PM  Vitals with BMI  Systolic 115 94 104  Diastolic 62 63 73  Pulse 67 77 82   No labs ordered this a.m., ordered late.  Creatinine increased, no apparent AKI.  BUN stable. Recent Labs    09/14/23 1627 09/14/23 2050 09/15/23 0600  GLUCAP 114* 111* 140*     P.o. intakes appropriate  incontinent of bladder  Last BM 7/27, small  ROS: Limited due to cognitive/behavioral   Objective:   No results found.    No results for input(s): WBC, HGB, HCT, PLT in the last 72 hours.   No results for input(s): NA, K, CL, CO2, GLUCOSE, BUN, CREATININE, CALCIUM  in the last 72 hours.    Intake/Output Summary (Last 24 hours) at 09/15/2023 0818 Last data filed at 09/15/2023 0817 Gross per 24 hour  Intake 1156 ml  Output --  Net 1156 ml        Physical Exam: Vital Signs Blood pressure 115/62, pulse 67, temperature 98.2 F (36.8 C), resp. rate 14, height 5' (1.524 m), weight 52.8 kg, SpO2 99%.   Constitutional: No distress . Vital signs reviewed.  Laying in bed. HEENT: NCAT, EOMI, oral membranes moist Neck: supple Cardiovascular: RRR without murmur. No JVD    Respiratory/Chest: CTA Bilaterally without wheezes or rales. Normal effort    GI/Abdomen: BS +, non-tender, non-distended Ext: no clubbing, cyanosis, or edema Psych: pleasant and cooperative  Skin: No evidence of breakdown, no evidence of rash.  Unstageable buttocks with adherent slough; MASD surrounding--appears to be improving, with clean with remaining slough 7-28   MSK:      No apparent deformity.  No apparent range of motion deficits.  Intermittent left shoulder abduction deficit.  Neurologic exam:  Cognition: Oriented to person, place,  and time. Persistent STM deficits. Limited insight and awareness. Mood: Flat affect, intermittently agitated Sensation: Equal and intact in BL UE and Les.  Reflexes: 2+ in BL UE and LEs. CN: 2-12 grossly intact.  Coordination: BL UE intention tremor--none apparent at rest Strength: Moving all 4 extremities at least 3+ to 4/5.   Neuro exam unchanged from the above on reexamination 09/15/23     Assessment/Plan: 1. Functional deficits which require 3+ hours per day of interdisciplinary therapy in a comprehensive inpatient rehab setting. Physiatrist is providing close team supervision and 24 hour management of active medical problems listed below. Physiatrist and rehab team continue to assess barriers to discharge/monitor patient progress toward functional and medical goals  Care Tool:  Bathing    Body parts bathed by patient: Right arm, Left arm, Chest, Abdomen, Front perineal area, Right upper leg, Left upper leg, Face, Buttocks, Right lower leg, Left lower leg   Body parts bathed by helper: Buttocks     Bathing assist Assist Level: Minimal Assistance - Patient > 75%     Upper Body Dressing/Undressing Upper body dressing   What is the patient wearing?: Pull over shirt    Upper  body assist Assist Level: Supervision/Verbal cueing    Lower Body Dressing/Undressing Lower body dressing      What is the patient wearing?: Pants     Lower body assist Assist for lower body dressing: Contact Guard/Touching assist     Toileting Toileting    Toileting assist Assist for toileting: Minimal Assistance - Patient > 75%     Transfers Chair/bed transfer  Transfers assist  Chair/bed transfer activity did not occur: Safety/medical concerns  Chair/bed transfer assist level: Minimal Assistance - Patient > 75%     Locomotion Ambulation   Ambulation assist      Assist level: Minimal Assistance - Patient > 75% Assistive device: No Device Max distance: 130'   Walk 10 feet  activity   Assist     Assist level: Minimal Assistance - Patient > 75% Assistive device: No Device   Walk 50 feet activity   Assist Walk 50 feet with 2 turns activity did not occur: Safety/medical concerns  Assist level: Minimal Assistance - Patient > 75% Assistive device: No Device    Walk 150 feet activity   Assist Walk 150 feet activity did not occur: Safety/medical concerns         Walk 10 feet on uneven surface  activity   Assist Walk 10 feet on uneven surfaces activity did not occur: Safety/medical concerns         Wheelchair     Assist Is the patient using a wheelchair?: Yes Type of Wheelchair: Manual    Wheelchair assist level: Dependent - Patient 0% Max wheelchair distance: 150'    Wheelchair 50 feet with 2 turns activity    Assist        Assist Level: Dependent - Patient 0%   Wheelchair 150 feet activity     Assist      Assist Level: Dependent - Patient 0%   Blood pressure 115/62, pulse 67, temperature 98.2 F (36.8 C), resp. rate 14, height 5' (1.524 m), weight 52.8 kg, SpO2 99%.  Medical Problem List and Plan: 1. 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 Severe motor planning and apraxia, impulsive ex. Tried to sit mid-air. Max A overall but can power up with Min A. Will need 24/7 Min A on discharge.   7/22: Apraxia, initiation and sequencing better. Gait limited by poor therapy tolerance/behaviors. D3 diet with thin liquids, full supervision.   7-23: Reduce therapies to daily due to poor tolerance; will discuss with family earlier discharge date given continued refusal--per patient's mother, she will participate as long as she is there, no plan to move date at this time  -Continue CIR therapies including PT, OT, and SLP    2.   Antithrombotics: -DVT/anticoagulation:  Pharmaceutical: Lovenox              -antiplatelet therapy: Aspirin  81 mg daily  3. Pain Management: Tylenol  as needed  4. Mood/Behavior/Sleep/bipolar disorder II - poorly controlled: BuSpar  15 mg twice daily, Lamictal  titrating to 100 mg daily             -antipsychotic agents: N/A   - 7/15: Very flat affect and poor initiation per nursing; will need full SPV for meals per nursing. Continue current medication  titration - no mania, SI, HI  - Per psych consult inpatient:  Will plan to slowly titrate lamictal  since she has not been taking it for at least 1 month and to prevent risk of side effects such as SJS. Will also plan to start trazodone  at a lower dose compared to her home dose of 400 mg in setting of preventing additional sedation, altered mental status, and since she had been sleeping well while in the hospital. We will defer to outpatient psychiatry for titrating trazodone  if indicated. She can follow-up with her outpatient psychiatric provider at [facility] upon discharge.  - ?resumption on invega 3 mg; will defer to OP  7-21: Patient's mother has asked be informed if she refuses therapies, do not give patient the option and she will be more receptive. 7/23: Refusing therapies intermittently, increasingly agitated with attempts to encourage.  Add Seroquel  25 mg twice daily as needed for agitation.   -  Invega last filled March 2025, would not restart at this time before discussing reasoning/side effects with mother. --They are unsure about this medication - Move therapies to daily for tolerance.  09-10-24: Increase trazodone  to 100 mg nightly--sleeping well  5. Neuropsych/cognition: This patient is not capable of making decisions on her own behalf.  - 7/22: Started on telesitter for OOB attempts; discussed with patient and mom possible veil bed if behaviors continue--has not had any further issues  6. Skin/Wound Care: Routine skin checks   -  7/15: Unstageable sacral PI; awaiting WC consult. Safety zone report completed.   Cleanse with Vashe [id number], not rinse, pat dry the peri-wound skin. Apply Xeroform on the wound bed daily. Cover with sacrum foam dressing changing every 3 days or PRN soiling.   7/22: Wound care, changing to Santyl  for debridement daily, then cover with moist 2 x 2 and foam dressing with dressing changes every 3 days or as needed.    7. Fluids/Electrolytes/Nutrition: Routine in and outs with follow-up chemistries   - 7/15: Albumin 2.3; monitor PO intakes, Ensure BID. See #15 below.   - 7-21: No AM labs; will order for late today--mild hyponatremia, 132.  Other labs are stable.  Repeat in a.m, may have been secondary to Keflex  over the weekend which is now discontinued.  7-22: Hyponatremia-resolved.  8.  CAD/Non-STEMI/acute on chronic diastolic congestive heart failure.  Cardiac catheterization found to have chronic LAD.  Continue aspirin  therapy   - daily weights --not being performed, will reorder today  -7 /25: Reminded nursing to please get daily weights  7/27 still no weight checked. Will ask a 2nd time today  - Euvolemic appearance.  Monitor. Filed Weights   09/12/23 1832  Weight: 52.8 kg     9.  Diabetes mellitus with peripheral neuropathy.  Hemoglobin A1c 4.8.  NovoLog  5 units 3 times daily, Semglee  12 units daily             -CBGs well controlled  Recent Labs    09/14/23 1627 09/14/23 2050 09/15/23 0600  GLUCAP 114* 111* 140*     10.  Leukocytosis/UTI.  Antibiotic therapy completed 11.  Hyperammonemia.  Continue Chronulac  10 mg twice daily.  Follow-up ammonia levels             -last ammonia level 20 on 7/9  12.  Hypothyroidism.  Synthroid  13.  Documented CKD stage III.  Latest creatinine 0.95.  Follow-up chemistries.  Follow-up with [facility] nephrology  - BUN/creatinine have remained stable within her normal range  14.  Chronic anemia.  Follow-up CBC. Stable 9-10.  Add iron  supplement.  15.  Decreased nutritional storage.  Diet advanced to regular.  Follow-up dietary services             -intake poor/inconsistent             -engage RD for assistance   16.  COPD/tobacco use/marijuana use.  Continue inhalers as directed.  Check oxygen saturations every shift.  Urine drug screen negative.  Provide counseling   17. Urinary retention/relapsing UTI. Foley replaced 7/4. Awaiting WC consult as above for recs prior to removal--see above, DC foley trial started 7/15.    - 7/16 2x ISC overnight; 1x incontinent void; increase flomax  to 0.8 mg, start timed toiletting Q4-6 hours  - 7/17: Start bethenachol 10 mg TID for stimulation; get urinalysis  - 7-18: No improvement in urinary retention; urinalysis borderline but will opt to treat in light of symptoms.  Start Keflex  500 mg twice daily for 7 days.  Add on urine culture--follow-up on results- > 100K Klebsiealla p await sensitivities   7-21: Pan resistant Klebsiella pneumonia; .  With same species on 7-3 urine culture, was through course of meropenem  at that time.  Pharmacy started Invanz  1 g for 3 doses yesterday for ESBL; discussing with ID pharmacy given failure of their first treatment.  Will get renal ultrasound today to evaluate for structural causes of recurrence  7-22: Improving continence, continue current regimen.  Renal ultrasound with some mild left-sided hydronephrosis, no other significant findings.  Since she has resumed spontaneous urination, will monitor for now, may consider CT abdomen and pelvis if suspicion for obstructing stone  7-23: DC bethanechol ; continue PVRs, discussed with nursing  7-24: Started retaining again overnight, resume bethanechol  10 mg 3 times daily.  Will get CT renal study today to evaluate.  7-25: Symptoms resolved with bethanechol , CT study with some signs of cystitis but with recent UTI this could be residual.  No stones, no other features concerning for bladder obstruction.  Would continue  through today and then may try to wean to 5 mg 3 times daily over the weekend, but would not discontinue. - Will need outpatient urology follow-up  7/26-27--incontinent. No retention reported but no scans done either.  -will reduce urecholine  to 5mg  tid 7-28: Voiding but incontinent with current regimen; resume PVRs--did remind nursing that these needs to be done to monitor medication changes.  Urinalysis today improved.  18. Constipation. Small, hard BM this AM. Add Miralax  daily, sennakot s 1 tab BID.  On Chronulac  10 mg twice daily.   - BM 7/16, large, with some rectal bleeding--add cream  -7-21: Multiple large, incontinent, liquid bowel movements overnight.  Likely secondary to antibiotics, DC daily Senokot, Chronulac --continue miralax   7-23: Continuing loose bowel movements, likely complicated by bethanechol ; move MiraLAX  as needed  7/27--moved bowels, small   19. L drop arm. ?RTC. Patient endorses fall 2 months ago with no workup. Will xray.   - 7/16: Xray w/o acute fractuire or dislocation - 7/22: Improved today with distraction. monitor  20. L calcified carotid plaque. CTA vs US  evaluation as OP.   21. Hx CVA with chronic R HP 2016 noted Left parietal on FLAIR and DWI but had a large DDX including CIN, PRES, was at [facility] center 06/14/14-06/24/14, given persistent of MRI finding, CVA is the cause  for R HP   7-21: Patient denies right hip pain on today's exam  22. AKI. Likely d/t dehydration, UTI.  - Give 1 L IVF normal saline at 100 cc/hr today. Repeat BMP in AM.  -  Encourage PO fluids - 7/23: Cr improved; can stop IV fluids.  7/27 eating and drinking fairly well, not sure how accurate fluids are being tracked  7-28: Mild creatinine increase, recurrent.  Not yet at AKI level.  Will monitor.  LOS: 14 days A FACE TO FACE EVALUATION WAS PERFORMED  Joesph JAYSON Likes 09/15/2023, 8:18 AM

## 2023-09-15 NOTE — Progress Notes (Signed)
 Physical Therapy Session Note  Patient Details  Name: Sarah Davis MRN: [id number] Date of Birth: Jul 02, 1960  Today's Date: 09/15/2023 PT Individual Time: 8871-8844 PT Individual Time Calculation (min): 27 min   Short Term Goals: Week 2:  PT Short Term Goal 1 (Week 2): Pt will complete sit to stand with CGA consistently. PT Short Term Goal 2 (Week 2): Pt will complete bed to chair with CGA consistently. PT Short Term Goal 3 (Week 2): Pt will ambulate x100' with minA and LRAD.  Skilled Therapeutic Interventions/Progress Updates:      Pt presents sitting up in TIS wheelchair with her mother at the bedside. Pt has no reports of pain during treatment session.  Sit<>Stand with HHA and minA, primarily for initiation. Ambulates with HHA at [facility] level from her room to the main rehab gym, `127ft. Cues for safety awareness, having a more natural arm swing (arms kept up in high guard position), and improving stability during turns.   Rest break provided before instructing her in stair climbing. She navigated a total of x8 steps (6) with 2 hand rails and CGA/minA for safety. Pt completes with a step-to pattern for descent and reciprocal stepping for ascent. Additional rest break needed after stair training due to fatigue.   Ambulated from main gym to ortho gym with similar assist and cues as above, ~143ft. Pt keeps her R foot externally rotated and remains unsteady on her feet. Seated rest break provided for initiating car transfer training. She completed car transfer at [facility] level with car height simulating a standard sedan level. Safety cues for sitting bottom in first before getting legs into the vehicle.   Pt then ambulated back to main gym ~152ft with HHA with cues for safety and awareness. Pt needing a seated rest break before returning the remaining distance back to her room. Patient ended session seated in wheelchair, needs met, seat belt alarm on.   *Pt needing encouragement throughout session  for participation. Pt repeating asking to return to her room throughout the session. Redirection as needed.   Therapy Documentation Precautions:  Restrictions Weight Bearing Restrictions Per Provider Order: No General:      Therapy/Group: Individual Therapy  Sherlean SHAUNNA Perks 09/15/2023, 7:51 AM

## 2023-09-15 NOTE — Progress Notes (Signed)
 Nutrition Follow-up  DOCUMENTATION CODES:   Not applicable  INTERVENTION:   Encourage intake of Ensure Plus High Protein PO BID, each supplement provides 350 kcal and 20 grams of protein. Encourage intake of Juven PO BID, each packet provides 80 calories, 8 grams of carbohydrate, 2.5  grams of protein (collagen), 7 grams of L-arginine and 7 grams of L-glutamine; supplement contains CaHMB, Vitamins C, E, B12 and Zinc  to promote wound healing. Continue MVI with minerals daily.  NUTRITION DIAGNOSIS:   Increased nutrient needs related to wound healing as evidenced by estimated needs.  Ongoing   GOAL:   Patient will meet greater than or equal to 90% of their needs  Progressing   MONITOR:   PO intake, Supplement acceptance, Diet advancement, Labs, Skin  REASON FOR ASSESSMENT:   Consult Assessment of nutrition requirement/status, Other (Comment), Wound healing (Maintain follow up from acute care)  ASSESSMENT:   Pt with hx of CKD3, COPD, CAD, hx CVA, pancreatitis, right hemiparesis, Hepatitis C, CHF,  and DM type 2 presented to ED from [facility] for non-STEMI and underwent cardiac cath where she was found to have chronic LAD. Now transferred to CIR.  Patient remains on a dysphagia 3 diet with thin liquids. Meal intakes variable at 0-100%, averaging 57% over the past 2-3 days. She is drinking the Ensure and Juven supplements ~50% of the time.   Labs reviewed.  CBG: [phone]  Medications reviewed and include vitamin C , ferrous sulfate , folic acid , novolog , semglee , MVI with minerals, Juven, flomax .  CIR admit weight: 52.3 kg (7/15) Current weight: 52.8 kg (7/25)  Diet Order:   Diet Order             DIET DYS 3 Fluid consistency: Thin  Diet effective now                   EDUCATION NEEDS:   Not appropriate for education at this time  Skin:  Skin Assessment: Skin Integrity Issues: Skin Integrity Issues:: Unstageable Unstageable:  Sacrum  Last BM:  7/27 type 2  Height:   Ht Readings from Last 1 Encounters:  09/01/23 5' (1.524 m)    Weight:   Wt Readings from Last 1 Encounters:  09/12/23 52.8 kg    BMI:  Body mass index is 22.73 kg/m.  Estimated Nutritional Needs:   Kcal:  1500-1700 kcal  Protein:  75-90 gm /day  Fluid:  >1.5L/day   Suzen HUNT RD, LDN, CNSC Contact via secure chat. If unavailable, use group chat RD Inpatient.

## 2023-09-16 ENCOUNTER — Inpatient Hospital Stay (HOSPITAL_COMMUNITY): Payer: MEDICAID

## 2023-09-16 LAB — GLUCOSE, CAPILLARY
Glucose-Capillary: 151 mg/dL — ABNORMAL HIGH (ref 70–99)
Glucose-Capillary: 143 mg/dL — ABNORMAL HIGH (ref 70–99)
Glucose-Capillary: 151 mg/dL — ABNORMAL HIGH (ref 70–99)

## 2023-09-16 MED ORDER — BETHANECHOL CHLORIDE 10 MG PO TABS
10.0000 mg | ORAL_TABLET | Freq: Three times a day (TID) | ORAL | Status: DC
  Administered 2023-09-16 – 2023-09-23 (×21): 10 mg via ORAL
  Filled 2023-09-16 (×21): qty 1

## 2023-09-16 NOTE — Progress Notes (Signed)
 Patient ID: Lyanne Kates, female   DOB: [birth year], 63 y.o.   MRN: [id number]  SW met with pt and mother to provide updates from team conference, and confirm fam edu on Friday 9am-12pm.  Graeme Jude, MSW, LCSW Office: [phone] Cell: [phone] Fax: [phone]

## 2023-09-16 NOTE — Plan of Care (Signed)
  Problem: Consults Goal: RH GENERAL PATIENT EDUCATION Description: See Patient Education module for education specifics. Outcome: Progressing   Problem: RH BOWEL ELIMINATION Goal: RH STG MANAGE BOWEL WITH ASSISTANCE Description: STG Manage Bowel with minimal  Assistance. Outcome: Progressing   Problem: RH BLADDER ELIMINATION Goal: RH STG MANAGE BLADDER WITH ASSISTANCE Description: STG Manage Bladder With minimal  Assistance Outcome: Progressing   Problem: RH SKIN INTEGRITY Goal: RH STG SKIN FREE OF INFECTION/BREAKDOWN Description: Manage skin free of infection/breakdown with minimal assistance Outcome: Progressing   Problem: RH SAFETY Goal: RH STG ADHERE TO SAFETY PRECAUTIONS W/ASSISTANCE/DEVICE Description: STG Adhere to Safety Precautions With minimal  Assistance/Device. Outcome: Progressing

## 2023-09-16 NOTE — Progress Notes (Signed)
 PROGRESS NOTE   Subjective/Complaints:  No events overnight.  No acute complaints. P.o. intakes appropriate; BG slightly high incontinent of bladder but no further ISC - had a PVR of 600+ this AM - ISC x1. Patient states she had urge to void but could not empty completely.  Last BM 7/28, small  Later, patient endorsing chest pain with PT after a short walk. Says she has been having these intermittently, every few days, non-radiating but associated with SOB, resolve within about 1 minute with rest. Vitals showed HR low-100s during episode otherwise stable.    ROS: Limited due to cognitive/behavioral  + Intermittent CP  Objective:   No results found.    Recent Labs    09/15/23 1035  WBC 8.7  HGB 11.3*  HCT 36.2  PLT 324     Recent Labs    09/15/23 1035  NA 138  K 4.2  CL 101  CO2 25  GLUCOSE 208*  BUN 23  CREATININE 1.04*  CALCIUM  9.9      Intake/Output Summary (Last 24 hours) at 09/16/2023 1005 Last data filed at 09/16/2023 0911 Gross per 24 hour  Intake 832 ml  Output --  Net 832 ml        Physical Exam: Vital Signs Blood pressure (!) 113/56, pulse 65, temperature 98.1 F (36.7 C), resp. rate 16, height 5' (1.524 m), weight 83 kg, SpO2 95%.   Constitutional: No distress . Vital signs reviewed.  Sitting up in gym.  HEENT: NCAT, EOMI, oral membranes moist Neck: supple Cardiovascular: RRR, mild systolic murmur, no JVD, no peripheral edema, pulses 2+.  Respiratory/Chest: CTA Bilaterally without wheezes or rales. Normal effort    GI/Abdomen: BS +, non-tender, non-distended Ext: no clubbing, cyanosis, or edema Psych: pleasant and cooperative  Skin: No evidence of breakdown, no evidence of rash.  Unstageable buttocks with adherent slough; MASD surrounding--not examined 7/29   MSK:        + L shoulder drop-arm; palpable enlargement along L lateral glenoid. No TTP  No chest pain reproducible on  palpation  Neurologic exam:  Cognition: Oriented to person, place, and time. Persistent STM deficits. Limited insight and awareness. Mood: Flat affect, intermittently agitated Sensation: Equal and intact in BL UE and Les.  Reflexes: 2+ in BL UE and LEs. CN: 2-12 grossly intact.  Coordination: BL UE intention tremor--none apparent at rest Strength: Moving all 4 extremities at least 4/5.   Neuro exam unchanged from the above on reexamination 09/16/23     Assessment/Plan: 1. Functional deficits which require 3+ hours per day of interdisciplinary therapy in a comprehensive inpatient rehab setting. Physiatrist is providing close team supervision and 24 hour management of active medical problems listed below. Physiatrist and rehab team continue to assess barriers to discharge/monitor patient progress toward functional and medical goals  Care Tool:  Bathing    Body parts bathed by patient: Right arm, Left arm, Chest, Abdomen, Front perineal area, Right upper leg, Left upper leg, Face, Buttocks, Right lower leg, Left lower leg   Body parts bathed by helper: Buttocks     Bathing assist Assist Level: Minimal Assistance - Patient > 75%     Upper Body  Dressing/Undressing Upper body dressing   What is the patient wearing?: Pull over shirt    Upper body assist Assist Level: Supervision/Verbal cueing    Lower Body Dressing/Undressing Lower body dressing      What is the patient wearing?: Pants     Lower body assist Assist for lower body dressing: Contact Guard/Touching assist     Toileting Toileting    Toileting assist Assist for toileting: Minimal Assistance - Patient > 75%     Transfers Chair/bed transfer  Transfers assist  Chair/bed transfer activity did not occur: Safety/medical concerns  Chair/bed transfer assist level: Minimal Assistance - Patient > 75%     Locomotion Ambulation   Ambulation assist      Assist level: Minimal Assistance - Patient >  75% Assistive device: Hand held assist Max distance: 150'   Walk 10 feet activity   Assist     Assist level: Minimal Assistance - Patient > 75% Assistive device: Hand held assist   Walk 50 feet activity   Assist Walk 50 feet with 2 turns activity did not occur: Safety/medical concerns  Assist level: Minimal Assistance - Patient > 75% Assistive device: Hand held assist    Walk 150 feet activity   Assist Walk 150 feet activity did not occur: Safety/medical concerns  Assist level: Minimal Assistance - Patient > 75% Assistive device: Hand held assist    Walk 10 feet on uneven surface  activity   Assist Walk 10 feet on uneven surfaces activity did not occur: Safety/medical concerns         Wheelchair     Assist Is the patient using a wheelchair?: Yes Type of Wheelchair: Manual    Wheelchair assist level: Dependent - Patient 0% Max wheelchair distance: 150'    Wheelchair 50 feet with 2 turns activity    Assist        Assist Level: Dependent - Patient 0%   Wheelchair 150 feet activity     Assist      Assist Level: Dependent - Patient 0%   Blood pressure (!) 113/56, pulse 65, temperature 98.1 F (36.7 C), resp. rate 16, height 5' (1.524 m), weight 83 kg, SpO2 95%.  Medical Problem List and Plan: 1. 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 Severe motor planning and apraxia, impulsive ex. Tried to sit mid-air. Max A overall but can power up with Min A. Will need 24/7 Min A on discharge.   7/22: Apraxia, initiation and sequencing better. Gait limited by poor therapy tolerance/behaviors. D3 diet with thin liquids, full supervision.   7-23: Reduce therapies to daily due to poor tolerance; will discuss with family earlier discharge date given continued refusal--per patient's mother, she  will participate as long as she is there, no plan to move date at this time  7/28: CGA to Min A goals with OT - progressing well. Doing better with participation this week. CGA to Min A ambulation without AD; c/b balance and endurance deficits. SLP moderate cog deficits c/b poor effort and behavior. Per mom, some wordfinding deficits but otherwise close to baseline - Mod A currently with goal Min A.   -Continue CIR therapies including PT, OT, and SLP    2.  Antithrombotics: -DVT/anticoagulation:  Pharmaceutical: Lovenox              -  antiplatelet therapy: Aspirin  81 mg daily  3. Pain Management: Tylenol  as needed  4. Mood/Behavior/Sleep/bipolar disorder II - poorly controlled: BuSpar  15 mg twice daily, Lamictal  titrating to 100 mg daily             -antipsychotic agents: N/A   - 7/15: Very flat affect and poor initiation per nursing; will need full SPV for meals per nursing. Continue current medication  titration - no mania, SI, HI  - Per psych consult inpatient: Will plan to slowly titrate lamictal  since she has not been taking it for at least 1 month and to prevent risk of side effects such as SJS. Will also plan to start trazodone  at a lower dose compared to her home dose of 400 mg in setting of preventing additional sedation, altered mental status, and since she had been sleeping well while in the hospital. We will defer to outpatient psychiatry for titrating trazodone  if indicated. She can follow-up with her outpatient psychiatric provider at [facility] upon discharge.  - ?resumption on invega 3 mg; will defer to OP  7-21: Patient's mother has asked be informed if she refuses therapies, do not give patient the option and she will be more receptive. 7/23: Refusing therapies intermittently, increasingly agitated with attempts to encourage.  Add Seroquel  25 mg twice daily as needed for agitation.   -  Invega last filled March 2025, would not restart at this time before discussing  reasoning/side effects with mother. --They are unsure about this medication - Move therapies to daily for tolerance.  09-10-24: Increase trazodone  to 100 mg nightly--sleeping well  5. Neuropsych/cognition: This patient is not capable of making decisions on her own behalf.  - 7/22: Started on telesitter for OOB attempts; discussed with patient and mom possible veil bed if behaviors continue--has not had any further issues  6. Skin/Wound Care: Routine skin checks   - 7/15: Unstageable sacral PI; awaiting WC consult. Safety zone report completed.   Cleanse with Vashe [id number], not rinse, pat dry the peri-wound skin. Apply Xeroform on the wound bed daily. Cover with sacrum foam dressing changing every 3 days or PRN soiling.   7/22: Wound care, changing to Santyl  for debridement daily, then cover with moist 2 x 2 and foam dressing with dressing changes every 3 days or as needed.    7. Fluids/Electrolytes/Nutrition: Routine in and outs with follow-up chemistries   - 7/15: Albumin 2.3; monitor PO intakes, Ensure BID. See #15 below.   - 7-21: No AM labs; will order for late today--mild hyponatremia, 132.  Other labs are stable.  Repeat in a.m, may have been secondary to Keflex  over the weekend which is now discontinued.  7-22: Hyponatremia-resolved.  8.  CAD/Non-STEMI/acute on chronic diastolic congestive heart failure.  Cardiac catheterization found to have chronic LAD.  Continue aspirin  therapy   - daily weights --not being performed, will reorder today   - 7/29: Complaining of intermittent CP, not reproducible, c/w stable angina, resolved at time of exam. Known near-total occlusion of LAD. Reminded patient to be vocal when this occurs. Getting EKG, orthostats and CXR for eval  Filed Weights   09/12/23 1832 09/16/23 0500  Weight: 52.8 kg 83 kg     9.  Diabetes mellitus with peripheral neuropathy.  Hemoglobin A1c 4.8.  NovoLog  5 units 3 times daily, Semglee  12 units daily             -CBGs well  controlled--intermittent elevations today  Recent Labs    09/15/23 1732 09/15/23  2123 09/16/23 0601  GLUCAP 200* 206* 151*     10.  Leukocytosis/UTI.  Antibiotic therapy completed 11.  Hyperammonemia.  Continue Chronulac  10 mg twice daily.  Follow-up ammonia levels             -last ammonia level 20 on 7/9  12.  Hypothyroidism.  Synthroid  13.  Documented CKD stage III.  Latest creatinine 0.95.  Follow-up chemistries.  Follow-up with [facility] nephrology  - BUN/creatinine have remained stable within her normal range  14.  Chronic anemia.  Follow-up CBC. Stable 9-10.  Add iron supplement. 15.  Decreased nutritional storage.  Diet advanced to regular.  Follow-up dietary services             -intake poor/inconsistent             -engage RD for assistance   16.  COPD/tobacco use/marijuana use.  Continue inhalers as directed.  Check oxygen saturations every shift.  Urine drug screen negative.  Provide counseling   17. Urinary retention/relapsing UTI. Foley replaced 7/4. Awaiting WC consult as above for recs prior to removal--see above, DC foley trial started 7/15.    - 7/16 2x ISC overnight; 1x incontinent void; increase flomax  to 0.8 mg, start timed toiletting Q4-6 hours  - 7/17: Start bethenachol 10 mg TID for stimulation; get urinalysis  - 7-18: No improvement in urinary retention; urinalysis borderline but will opt to treat in light of symptoms.  Start Keflex  500 mg twice daily for 7 days.  Add on urine culture--follow-up on results- > 100K Klebsiealla p await sensitivities   7-21: Pan resistant Klebsiella pneumonia; .  With same species on 7-3 urine culture, was through course of meropenem  at that time.  Pharmacy started Invanz  1 g for 3 doses yesterday for ESBL; discussing with ID pharmacy given failure of their first treatment.  Will get renal ultrasound today to evaluate for structural causes of recurrence  7-22: Improving continence, continue current regimen.  Renal ultrasound with  some mild left-sided hydronephrosis, no other significant findings.  Since she has resumed spontaneous urination, will monitor for now, may consider CT abdomen and pelvis if suspicion for obstructing stone  7-23: DC bethanechol ; continue PVRs, discussed with nursing  7-24: Started retaining again overnight, resume bethanechol  10 mg 3 times daily.  Will get CT renal study today to evaluate.  7-25: Symptoms resolved with bethanechol , CT study with some signs of cystitis but with recent UTI this could be residual.  No stones, no other features concerning for bladder obstruction.  Would continue through today and then may try to wean to 5 mg 3 times daily over the weekend, but would not discontinue. - Will need outpatient urology follow-up  7/26-27--incontinent. No retention reported but no scans done either.  -will reduce urecholine  to 5mg  tid 7-28: Voiding but incontinent with current regimen; resume PVRs--did remind nursing that these needs to be done to monitor medication changes.  Urinalysis today improved.  7/29: Retained again today; increase bethenachol to 10 mg TID. Will assess next exam for vaginal atrophy, may benefit from daily estradiol cream   18. Constipation. Small, hard BM this AM. Add Miralax  daily, sennakot s 1 tab BID.  On Chronulac  10 mg twice daily.   - BM 7/16, large, with some rectal bleeding--add cream  -7-21: Multiple large, incontinent, liquid bowel movements overnight.  Likely secondary to antibiotics, DC daily Senokot, Chronulac --continue miralax   7-23: Continuing loose bowel movements, likely complicated by bethanechol ; move MiraLAX  as needed  7/27--moved bowels,  small   19. L drop arm. ?RTC. Patient endorses fall 2 months ago with no workup. Will xray.   - 7/16: Xray w/o acute fractuire or dislocation  - 7/29: Palpable muscle retraction at glenoid, nonpainful ROM deficits c/w RTC; will need OP ortho eval once medically stable  20. L calcified carotid plaque. CTA vs US   evaluation as OP.   21. Hx CVA with chronic R HP 2016 noted Left parietal on FLAIR and DWI but had a large DDX including CIN, PRES, was at [facility] center 06/14/14-06/24/14, given persistent of MRI finding, CVA is the cause  for R HP   7-21: Patient denies right hip pain on today's exam  22. AKI. Likely d/t dehydration, UTI.  - Give 1 L IVF normal saline at 100 cc/hr today. Repeat BMP in AM.  - Encourage PO fluids - 7/23: Cr improved; can stop IV fluids.  7/27 eating and drinking fairly well, not sure how accurate fluids are being tracked  7-28: Mild creatinine increase, recurrent.  Not yet at AKI level.  Will monitor.  LOS: 15 days A FACE TO FACE EVALUATION WAS PERFORMED  Joesph JAYSON Likes 09/16/2023, 10:05 AM

## 2023-09-16 NOTE — Progress Notes (Signed)
 Physical Therapy Session Note  Patient Details  Name: Sarah Davis MRN: [id number] Date of Birth: 1960-05-29  Today's Date: 09/16/2023 PT Individual Time: 1135-1203 PT Individual Time Calculation (min): 28 min   Short Term Goals: Week 2:  PT Short Term Goal 1 (Week 2): Pt will complete sit to stand with CGA consistently. PT Short Term Goal 2 (Week 2): Pt will complete bed to chair with CGA consistently. PT Short Term Goal 3 (Week 2): Pt will ambulate x100' with minA and LRAD.  Skilled Therapeutic Interventions/Progress Updates:     Pt received supine in bed and agrees to therapy. No complaint of pain. Supine to sit with bed features. Pt performs sit to stand with CGA and cues for initiation. Pt ambulates to toilet with cues to attempt urination despite pt reporting that she does not need to go. Once seated on toilet pt does void bladder and performs pericare. Pt stands and ambulates to gym with CGA and cues for upright gaze to improve posture and balance and increasing trunk rotation to improve balance. Pt stands and tosses basketball at hoop to challenge dynamic balance and coordination, also bending to pick up ball from floor multiple times. PT provides CGA/minA during activity. Following pt reports that her heart hurts. Pt places pulse ox on finger and pt is at 100% O2 on room air and HR of ~100 bpm. Pt reports decease in symptoms after ~1 minute. DO rounds on pt during session and PT informs of angina. Pt ambulates additional 1x150' and 1x100' with CGA and similar cues. Left supine with all needs within reach.  Therapy Documentation Precautions:  Restrictions Weight Bearing Restrictions Per Provider Order: No   Therapy/Group: Individual Therapy  [name], PT, DPT 09/16/2023, 5:18 PM

## 2023-09-16 NOTE — Patient Care Conference (Signed)
 Inpatient RehabilitationTeam Conference and Plan of Care Update Date: 09/16/2023   Time: 1005 am    Patient Name: Sarah Davis      Medical Record Number: [id number]  Date of Birth: Aug 17, 1960 Sex: Female         Room/Bed: 4W13C/4W13C-01 Payor Info: Payor: [employer] / Plan: [employer] PLAN / Product Type: *No Product type* /    Admit Date/Time:  09/01/2023 12:49 PM  Primary Diagnosis:  Acute metabolic encephalopathy  Hospital Problems: Principal Problem:   Acute metabolic encephalopathy    Expected Discharge Date: Expected Discharge Date: 09/23/23  Team Members Present: Physician leading conference: Dr. Joesph Likes Social Worker Present: Graeme Jude, LCSW Nurse Present: Eulalio Falls, RN PT Present: Kirt Dawn, PT OT Present: Nereida Habermann, OT SLP Present: Recardo Mole, SLP Other (Discipline and Name): Cara Daring , ADN     Current Status/Progress Goal Weekly Team Focus  Bowel/Bladder      Incontinent of bowel and bladder. Bladder retention needing I and O catheterization    Remain continent of bowel and bladder    Assess bowel and bladder q shift  Swallow/Nutrition/ Hydration   D3/thin   supervision - met  family education, continued use of strategies    ADL's   Pt now limited by behaviors- combined baseline and cognitive deficits impacting which limits participation. CGA UB ADLs, mod A LB ADLs, min A for ADL transfers   CGA- min A overall   Cognitive retraining, ADLs, transfers, d/c planning    Mobility   supervision bed mobility, CGA transfers, CGA/minA gait without AD ~200'   minA, need to upgrade  endurance, balance, family ed, DC prep    Communication   spontaneous            Safety/Cognition/ Behavioral Observations  modA cognition: problem solving, attention, STM, awareness, reasoning - behaviors limit participation, unsure how far she is from baseline. Mom continues to mention exacerbation of word finding deficits  only   minA   cognitive re training, pt/family education,    Pain      No c/o pain    <4 w/ prns    Assess pain q shift  Skin      Unstageable wound sacral area; santyl  daily covered 2x2 moist dressing/ foam   Reduce size of wound on discharge    Assess wound  q shift; continue with dressing changes    Discharge Planning:  Pt will d/c to her daughter's home and will have support with her daughter assisting. PCS referral sent to her insurance. Fam edu pending. SW will confirm there are no barriers to discharge.    Team Discussion: Patient was admitted post debility due to metabolic encephalopathy/ ICU myopathy. Patient limited by endurance deficits, behaviors combined baseline and cognitive deficits impacting limited participation with therapy.  Patient on target to meet rehab goals: yes, Patient needs CGA with upper body care and limited assistance with lower body care. Patient needs CGA with transfers. Patient was able to ambulate  up to 200' with out AD CGA/Min assistance. Patient needs mod assistance with attention , STM awareness and reasoning. Overall goals at discharge are set for minimal assistance.   *See Care Plan and progress notes for long and short-term goals.   Revisions to Treatment Plan:   N/A  Teaching Needs: Safety, medications, transfers, toileting, etc   Current Barriers to Discharge: Decreased caregiver support, Home enviroment access/layout, Incontinence, and Behavior  Possible Resolutions to Barriers: Family Education Outpatient follow-up  Medical Summary Current Status: medically complicate by encephalopathy c/b dementia, diabetes, dysphagia, CKD stage III, anemia, protein-calorie malutrition, urinary retention,, sacral wound, and anxiety  Barriers to Discharge: Behavior/Mood;Cardiac Complications;Complicated Wound;Medical stability;Renal Insufficiency/Failure;Self-care education;Uncontrolled Diabetes;Infection/IV  Antibiotics;Incontinence;Inadequate Nutritional Intake   Possible Resolutions to [employer]: monitor labs and vitals, titrate diabetes regimen,monitor sacral wound, monitor volume and respiratory status,  assistance from family with participation, progress diet with SLP and titrate off tube feeds as tolerated vs. PEG assessment later this week   Continued Need for Acute Rehabilitation Level of Care: The patient requires daily medical management by a physician with specialized training in physical medicine and rehabilitation for the following reasons: Direction of a multidisciplinary physical rehabilitation program to maximize functional independence : Yes Medical management of patient stability for increased activity during participation in an intensive rehabilitation regime.: Yes Analysis of laboratory values and/or radiology reports with any subsequent need for medication adjustment and/or medical intervention. : Yes   I attest that I was present, lead the team conference, and concur with the assessment and plan of the team.   Leasa Kincannon Gayo 09/16/2023, 1005 am

## 2023-09-16 NOTE — Progress Notes (Signed)
 Occupational Therapy Session Note  Patient Details  Name: Sarah Davis MRN: [id number] Date of Birth: [birth year]  Today's Date: 09/16/2023 OT Individual Time: 1345-1420 OT Individual Time Calculation (min): 35 min    Skilled Therapeutic Interventions/Progress Updates:    1:1 Pt received in the bed with mom at bedside. Pt provided with two choices to shower (wash hair) or practice things outside of the room. Pt chose to shower. PT performed bed mobility to come to EOB mod I with bed rail. Pt ambulated to the bathroom with contact guard with cues for proper hand placement to push up from bed. Pt ambulated to toilet. Pt able to void urine on the toilet and perform toileting with VC and doff clothing with supervision. Pt ambulated with contact guard from toilet to shower stall with grab bars. PT required cuing for all parts to be washed but pt with initiation of bathing self. Pt was able to dress from pile of clothes sitting next to her on closed lid of BSC. Pt able to dress sit to stand with supervision with encouragement and cues to attempt to more internally rotate her right hip/foot with sit to stands. PT ambulated back out to the bathroom and chose to sit in TIS chair outside of bed. Assisted with brushing her hair and drying it. Pt left sitting up with mom at side with safety alarm on.   Therapy Documentation Precautions:  Restrictions Weight Bearing Restrictions Per Provider Order: No General:   Vital Signs: Therapy Vitals Temp: 98.1 F (36.7 C) Pulse Rate: 78 Resp: 18 BP: 90/61 Patient Position (if appropriate): Lying Oxygen Therapy SpO2: 93 % O2 Device: Room Air Pain:      Therapy/Group: Individual Therapy  [name] Nest [facility] 09/16/2023, 4:12 PM

## 2023-09-17 LAB — GLUCOSE, CAPILLARY
Glucose-Capillary: 155 mg/dL — ABNORMAL HIGH (ref 70–99)
Glucose-Capillary: 261 mg/dL — ABNORMAL HIGH (ref 70–99)
Glucose-Capillary: 112 mg/dL — ABNORMAL HIGH (ref 70–99)
Glucose-Capillary: 221 mg/dL — ABNORMAL HIGH (ref 70–99)

## 2023-09-17 MED ORDER — ORAL CARE MOUTH RINSE: 15.0000 mL | OROMUCOSAL | Status: AC

## 2023-09-17 MED ORDER — TAMSULOSIN HCL 0.4 MG PO CAPS
0.4000 mg | ORAL_CAPSULE | Freq: Every day | ORAL | Status: DC
  Administered 2023-09-18 – 2023-09-19 (×2): 0.4 mg via ORAL
  Filled 2023-09-17 (×2): qty 1

## 2023-09-17 MED ORDER — ADULT MULTIVITAMIN W/MINERALS CH: 1.0000 | ORAL_TABLET | Freq: Every day | ORAL | Status: AC

## 2023-09-17 MED ORDER — SODIUM CHLORIDE 0.9 % IV SOLN: INTRAVENOUS | Status: AC

## 2023-09-17 MED ORDER — ACETAMINOPHEN 325 MG PO TABS: 650.0000 mg | ORAL_TABLET | ORAL | Status: AC | PRN

## 2023-09-17 NOTE — Progress Notes (Signed)
 Speech Language Pathology Daily Session Note  Patient Details  Name: Sarah Davis MRN: [id number] Date of Birth: 1960/02/26  Today's Date: 09/17/2023 SLP Individual Time: 8854-8784 SLP Individual Time Calculation (min): 30 min  Short Term Goals: Week 2: SLP Short Term Goal 1 (Week 2): Pt will utilize calendar as needed to verbalize orientation information w/ minA SLP Short Term Goal 2 (Week 2): Pt will utilize external memory aids as needed to recall recent information w/ 50% accuracy given modA SLP Short Term Goal 3 (Week 2): Pt will complete functional expression tasks w/ modA SLP Short Term Goal 4 (Week 2): Pt will sustain attention for 10 mins w/ maxA SLP Short Term Goal 5 (Week 2): Pt will improve initiation during functional and structured tasks to modA SLP Short Term Goal 6 (Week 2): Pt will solve simple problems during functional and structured cognitive tasks w/ modA  Skilled Therapeutic Interventions:   Pt and her mom greeted at bedside. She was up in her wheelchair upon SLP arrival. Agreeable to tx tasks targeting cognition and dysphagia. She was oriented w/in one day of current date. SLP provided minA to recall events of the morning thus far to record in her memory notebook. Then facilitated money management task via menu and she required minA for attention to details and organization throughout. Pt's lunch tray arrived, so SLP assisted w/ set up for noon meal. She benefited from minA cues for problem solving throughout, however, presented w/ some self limiting behaviors when opening items. She presented w/ rapid rate of intake and large bite size, though no overt s/s of airway invasion noted. Of note, pt's mom reported rapid rate of intake at baseline. Recommend cont Dys 3 diet and thin liquids. Continue w/ full supervision at this time as well. Pt was still consuming her dessert upon SLP departure, left upright in her chair with her mom present as well. Recommend cont ST per POC.    Pain  Reported stomach pain, appeared more c/w nausea vs true pain.   Therapy/Group: Individual Therapy  Recardo DELENA Mole 09/17/2023, 4:40 PM

## 2023-09-17 NOTE — Progress Notes (Signed)
 Physical Therapy Session Note  Patient Details  Name: Sarah Davis MRN: [id number] Date of Birth: [birth year]  Today's Date: 09/17/2023 PT Individual Time: 1032-1100 PT Individual Time Calculation (min): 28 min   Short Term Goals: Week 2:  PT Short Term Goal 1 (Week 2): Pt will complete sit to stand with CGA consistently. PT Short Term Goal 2 (Week 2): Pt will complete bed to chair with CGA consistently. PT Short Term Goal 3 (Week 2): Pt will ambulate x100' with minA and LRAD.  Skilled Therapeutic Interventions/Progress Updates:     Pt received seated at EOB and agrees to therapy. No complaint of pain. Pt performs sit to stand with CGA and cues for initiation. Pt ambulates x100' to gym with CGA and cues for increased trunk rotation to decrease risk for falls, as well as maintaining upright gaze to improve posture and balance. Following seated rest break, pt challenged to ambulate around nursing station with PT providing very close supervision and no manual assistance. Pt is able to do so without any LOBs. Following rest break, pt performs step ups on 3 exercise step as well as bosu ball to challenge balance and coordination. PT provides minA/modA for activity. Pt then reports pain in back and says it is because she need to urinate. Pt ambulates to restroom with CGA and is continent of bladder, with PT providing cues for optimal pericare to help prevent future UTIs. Pt left seated in tilt in space with alarm intact and all needs within reach.   Therapy Documentation Precautions:  Restrictions Weight Bearing Restrictions Per Provider Order: No  Therapy/Group: Individual Therapy  [name], PT, DPT 09/17/2023, 4:57 PM

## 2023-09-17 NOTE — Plan of Care (Signed)
  Problem: RH BOWEL ELIMINATION Goal: RH STG MANAGE BOWEL WITH ASSISTANCE Description: STG Manage Bowel with minimal Assistance. Outcome: Progressing   Problem: RH BLADDER ELIMINATION Goal: RH STG MANAGE BLADDER WITH ASSISTANCE Description: STG Manage Bladder With minimal Assistance Outcome: Progressing   Problem: RH SKIN INTEGRITY Goal: RH STG SKIN FREE OF INFECTION/BREAKDOWN Description: Manage skin free of infection /breakdown with minimal assistance Outcome: Progressing   Problem: RH SAFETY Goal: RH STG ADHERE TO SAFETY PRECAUTIONS W/ASSISTANCE/DEVICE Description: STG Adhere to Safety Precautions With minimal Assistance/Device. Outcome: Progressing

## 2023-09-17 NOTE — Progress Notes (Signed)
 Occupational Therapy Session Note  Patient Details  Name: Sarah Davis MRN: [id number] Date of Birth: 01-19-1961  Today's Date: 09/17/2023 OT Individual Time: 1345-1415 OT Individual Time Calculation (min): 30 min    Short Term Goals: Week 2:  OT Short Term Goal 1 (Week 2): Pt will complete LB dressing with min A OT Short Term Goal 2 (Week 2): Pt will complete ADLs at shower level no more than min cueing for sequencing and initiation OT Short Term Goal 3 (Week 2): Pt will complete stand pivot to the [facility] with CGA OT Short Term Goal 4 (Week 2): Pt will demo improved motor planning during ADLs by requiring no more than min cueing for sequencing  Skilled Therapeutic Interventions/Progress Updates:    Pt received sitting up in the TIS with no c/o pain, agreeable to OT session. Reviewed d/c planning and education with pt and her mother. Her mother [name] reports feeling comfortable with transfers. Pt stood from TIS with close (S). She completed 200 ft of functional mobility with CGA to challenge generalized activity tolerance and dynamic standing balance for carryover to increased independence with ADLs. Patient required increased time for initiation, cuing, rest breaks, and for completion of tasks throughout session. Utilized therapeutic use of self throughout to promote efficiency. She completed functional step ups holding a 4lb dumbbell to challenge household threshold management- stepping on/off a 4 in step. She completed 2 sets of 10. She returned to her room following. Pt was left supine all needs met, mother present and call bell within reach.    Therapy Documentation Precautions:  Restrictions Weight Bearing Restrictions Per Provider Order: No  Therapy/Group: Individual Therapy  [name] 09/17/2023, 2:00 PM

## 2023-09-17 NOTE — Plan of Care (Signed)
  Problem: Consults Goal: RH GENERAL PATIENT EDUCATION Description: See Patient Education module for education specifics. Outcome: Progressing   Problem: RH BOWEL ELIMINATION Goal: RH STG MANAGE BOWEL WITH ASSISTANCE Description: STG Manage Bowel with minimal Assistance. Outcome: Progressing   Problem: RH BLADDER ELIMINATION Goal: RH STG MANAGE BLADDER WITH ASSISTANCE Description: STG Manage Bladder With minimal Assistance Outcome: Progressing   Problem: RH SKIN INTEGRITY Goal: RH STG SKIN FREE OF INFECTION/BREAKDOWN Description: Manage skin free of infection/breakdown with minimal assistance Outcome: Progressing   Problem: RH SAFETY Goal: RH STG ADHERE TO SAFETY PRECAUTIONS W/ASSISTANCE/DEVICE Description: STG Adhere to Safety Precautions With minimal Assistance/Device. Outcome: Progressing   Problem: RH PAIN MANAGEMENT Goal: RH STG PAIN MANAGED AT OR BELOW PT'S PAIN GOAL Description: < 4 w/prns Outcome: Progressing   Problem: RH KNOWLEDGE DEFICIT GENERAL Goal: RH STG INCREASE KNOWLEDGE OF SELF CARE AFTER HOSPITALIZATION Description: Manage increase knowledge of self care after hospitalization with minimal assistance from family using educational materials provided Outcome: Progressing

## 2023-09-17 NOTE — Progress Notes (Signed)
 PROGRESS NOTE   Subjective/Complaints:  No events overnight.  No acute complaints. Patient reports no further episodes of chest pain.  Slept well overnight.  Was refusing therapies and medications this a.m.; advised nursing to wait on mom/family arrival, when patient is much more cooperative.   No further urinary retention per record.  Patient denies any dysuria.  Positive orthostats, ordered 500 cc IV fluid today, patient refused multiple times for IV replacement.  ROS: Limited due to cognitive/behavioral  + Intermittent CP--has not recurred   Objective:   DG CHEST PORT 1 VIEW Result Date: 09/16/2023 CLINICAL DATA:  [id number] SOB (shortness of breath) [id number] EXAM: PORTABLE CHEST - 1 VIEW COMPARISON:  July 21, 2023 FINDINGS: No focal airspace consolidation, pleural effusion, or pneumothorax. No cardiomegaly. No acute fracture or destructive lesion. IMPRESSION: No acute cardiopulmonary abnormality. Electronically Signed   By: Rogelia Myers M.D.   On: 09/16/2023 16:46      Recent Labs    09/15/23 1035  WBC 8.7  HGB 11.3*  HCT 36.2  PLT 324     Recent Labs    09/15/23 1035  NA 138  K 4.2  CL 101  CO2 25  GLUCOSE 208*  BUN 23  CREATININE 1.04*  CALCIUM  9.9      Intake/Output Summary (Last 24 hours) at 09/17/2023 2329 Last data filed at 09/17/2023 1748 Gross per 24 hour  Intake 606 ml  Output 24 ml  Net 582 ml        Physical Exam: Vital Signs Blood pressure (!) 123/58, pulse 77, temperature 98 F (36.7 C), temperature source Oral, resp. rate 18, height 5' (1.524 m), weight 83 kg, SpO2 95%.   Constitutional: No distress . Vital signs reviewed.  Laying in bed.  HEENT: NCAT, EOMI, oral membranes moist Neck: supple Cardiovascular: RRR, mild systolic murmur, no JVD, no peripheral edema, pulses 2+.  Respiratory/Chest: CTA Bilaterally without wheezes or rales. Normal effort    GI/Abdomen: BS +,  non-tender, non-distended Ext: no clubbing, cyanosis, or edema Psych: pleasant and cooperative  Skin: No evidence of breakdown, no evidence of rash.  Unstageable buttocks with adherent slough; MASD surrounding--not examined 7/30   MSK:        + L shoulder drop-arm; palpable enlargement along L lateral glenoid. No TTP  No chest pain reproducible on palpation  Neurologic exam:  Cognition: Oriented to person, place, and time.  Limited insight and awareness. Mood: Flat affect, intermittently agitated Sensation: Equal and intact in BL UE and Les.  Reflexes: 2+ in BL UE and LEs. CN: 2-12 grossly intact.  Coordination: BL UE intention tremor--none apparent at rest Strength: Moving all 4 extremities at least 5/5.   Neuro exam unchanged from the above on reexamination 09/17/23     Assessment/Plan: 1. Functional deficits which require 3+ hours per day of interdisciplinary therapy in a comprehensive inpatient rehab setting. Physiatrist is providing close team supervision and 24 hour management of active medical problems listed below. Physiatrist and rehab team continue to assess barriers to discharge/monitor patient progress toward functional and medical goals  Care Tool:  Bathing    Body parts bathed by patient: Right arm, Left arm, Chest, Abdomen, Front  perineal area, Right upper leg, Left upper leg, Face, Buttocks, Right lower leg, Left lower leg   Body parts bathed by helper: Buttocks     Bathing assist Assist Level: Supervision/Verbal cueing     Upper Body Dressing/Undressing Upper body dressing   What is the patient wearing?: Pull over shirt    Upper body assist Assist Level: Set up assist    Lower Body Dressing/Undressing Lower body dressing      What is the patient wearing?: Underwear/pull up, Pants     Lower body assist Assist for lower body dressing: Supervision/Verbal cueing     Toileting Toileting    Toileting assist Assist for toileting: Contact  Guard/Touching assist     Transfers Chair/bed transfer  Transfers assist  Chair/bed transfer activity did not occur: Safety/medical concerns  Chair/bed transfer assist level: Supervision/Verbal cueing     Locomotion Ambulation   Ambulation assist      Assist level: Minimal Assistance - Patient > 75% Assistive device: Hand held assist Max distance: 150'   Walk 10 feet activity   Assist     Assist level: Minimal Assistance - Patient > 75% Assistive device: Hand held assist   Walk 50 feet activity   Assist Walk 50 feet with 2 turns activity did not occur: Safety/medical concerns  Assist level: Minimal Assistance - Patient > 75% Assistive device: Hand held assist    Walk 150 feet activity   Assist Walk 150 feet activity did not occur: Safety/medical concerns  Assist level: Minimal Assistance - Patient > 75% Assistive device: Hand held assist    Walk 10 feet on uneven surface  activity   Assist Walk 10 feet on uneven surfaces activity did not occur: Safety/medical concerns         Wheelchair     Assist Is the patient using a wheelchair?: Yes Type of Wheelchair: Manual    Wheelchair assist level: Dependent - Patient 0% Max wheelchair distance: 150'    Wheelchair 50 feet with 2 turns activity    Assist        Assist Level: Dependent - Patient 0%   Wheelchair 150 feet activity     Assist      Assist Level: Dependent - Patient 0%   Blood pressure (!) 123/58, pulse 77, temperature 98 F (36.7 C), temperature source Oral, resp. rate 18, height 5' (1.524 m), weight 83 kg, SpO2 95%.  Medical Problem List and Plan: 1. 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 Severe motor planning and apraxia, impulsive ex. Tried to sit mid-air. Max A overall but can power up with Min A.  Will need 24/7 Min A on discharge.   7/22: Apraxia, initiation and sequencing better. Gait limited by poor therapy tolerance/behaviors. D3 diet with thin liquids, full supervision.   7-23: Reduce therapies to daily due to poor tolerance; will discuss with family earlier discharge date given continued refusal--per patient's mother, she will participate as long as she is there, no plan to move date at this time  7/28: CGA to Min A goals with OT - progressing well. Doing better with participation this week. CGA to Min A ambulation without AD; c/b balance and endurance deficits. SLP moderate cog deficits c/b poor effort and behavior. Per mom, some wordfinding deficits but  otherwise close to baseline - Mod A currently with goal Min A.   -Continue CIR therapies including PT, OT, and SLP    2.  Antithrombotics: -DVT/anticoagulation:  Pharmaceutical: Lovenox              -antiplatelet therapy: Aspirin  81 mg daily  3. Pain Management: Tylenol  as needed  4. Mood/Behavior/Sleep/bipolar disorder II - poorly controlled: BuSpar  15 mg twice daily, Lamictal  titrating to 100 mg daily             -antipsychotic agents: N/A   - 7/15: Very flat affect and poor initiation per nursing; will need full SPV for meals per nursing. Continue current medication  titration - no mania, SI, HI  - Per psych consult inpatient: Will plan to slowly titrate lamictal  since she has not been taking it for at least 1 month and to prevent risk of side effects such as SJS. Will also plan to start trazodone  at a lower dose compared to her home dose of 400 mg in setting of preventing additional sedation, altered mental status, and since she had been sleeping well while in the hospital. We will defer to outpatient psychiatry for titrating trazodone  if indicated. She can follow-up with her outpatient psychiatric provider at [facility] upon discharge.  - ?resumption on invega 3 mg; will defer to OP  7-21: Patient's mother has asked be  informed if she refuses therapies, do not give patient the option and she will be more receptive. 7/23: Refusing therapies intermittently, increasingly agitated with attempts to encourage.  Add Seroquel  25 mg twice daily as needed for agitation.   -  Invega last filled March 2025, would not restart at this time before discussing reasoning/side effects with mother. --They are unsure about this medication - Move therapies to daily for tolerance.  09-10-24: Increase trazodone  to 100 mg nightly--sleeping well  5. Neuropsych/cognition: This patient is not capable of making decisions on her own behalf.  - 7/22: Started on telesitter for OOB attempts; discussed with patient and mom possible veil bed if behaviors continue--has not had any further issues  6. Skin/Wound Care: Routine skin checks   - 7/15: Unstageable sacral PI; awaiting WC consult. Safety zone report completed.   Cleanse with Vashe [id number], not rinse, pat dry the peri-wound skin. Apply Xeroform on the wound bed daily. Cover with sacrum foam dressing changing every 3 days or PRN soiling.   7/22: Wound care, changing to Santyl  for debridement daily, then cover with moist 2 x 2 and foam dressing with dressing changes every 3 days or as needed.    7. Fluids/Electrolytes/Nutrition: Routine in and outs with follow-up chemistries   - 7/15: Albumin 2.3; monitor PO intakes, Ensure BID. See #15 below.   - 7-21: No AM labs; will order for late today--mild hyponatremia, 132.  Other labs are stable.  Repeat in a.m, may have been secondary to Keflex  over the weekend which is now discontinued.  7-22: Hyponatremia-resolved.  8.  CAD/Non-STEMI/acute on chronic diastolic congestive heart failure.  Cardiac catheterization found to have chronic LAD.  Continue aspirin  therapy   - daily weights --not being performed, will reorder today   - 7/29: Complaining of intermittent CP, not reproducible, c/w stable angina, resolved at time of exam. Known near-total  occlusion of LAD. Reminded patient to be vocal when this occurs. Getting EKG, orthostats and CXR for eval  7-30: EKG unchanged, chest x-ray normal, orthostats positive; ordered gentle IV fluids 500 cc today, patient refusing.  Reduce Flomax  to 0.4  mg daily. Filed Weights   09/12/23 1832 09/16/23 0500 09/17/23 0544  Weight: 52.8 kg 83 kg 83 kg     9.  Diabetes mellitus with peripheral neuropathy.  Hemoglobin A1c 4.8.  NovoLog  5 units 3 times daily, Semglee  12 units daily             -CBGs well controlled--intermittent elevations but overall well-controlled  Recent Labs    09/17/23 1110 09/17/23 1654 09/17/23 2200  GLUCAP 261* 112* 221*     10.  Leukocytosis/UTI.  Antibiotic therapy completed 11.  Hyperammonemia.  Continue Chronulac  10 mg twice daily.  Follow-up ammonia levels             -last ammonia level 20 on 7/9  12.  Hypothyroidism.  Synthroid  13.  Documented CKD stage III.  Latest creatinine 0.95.  Follow-up chemistries.  Follow-up with [facility] nephrology  - BUN/creatinine have remained stable within her normal range  14.  Chronic anemia.  Follow-up CBC. Stable 9-10.  Add iron supplement.  15.  Decreased nutritional storage.  Diet advanced to regular.  Follow-up dietary services             -intake poor/inconsistent             -engage RD for assistance   16.  COPD/tobacco use/marijuana use.  Continue inhalers as directed.  Check oxygen saturations every shift.  Urine drug screen negative.  Provide counseling   17. Urinary retention/relapsing UTI. Foley replaced 7/4. Awaiting WC consult as above for recs prior to removal--see above, DC foley trial started 7/15.    - 7/16 2x ISC overnight; 1x incontinent void; increase flomax  to 0.8 mg, start timed toiletting Q4-6 hours  - 7/17: Start bethenachol 10 mg TID for stimulation; get urinalysis  - 7-18: No improvement in urinary retention; urinalysis borderline but will opt to treat in light of symptoms.  Start Keflex  500 mg twice  daily for 7 days.  Add on urine culture--follow-up on results- > 100K Klebsiealla p await sensitivities   7-21: Pan resistant Klebsiella pneumonia; .  With same species on 7-3 urine culture, was through course of meropenem  at that time.  Pharmacy started Invanz  1 g for 3 doses yesterday for ESBL; discussing with ID pharmacy given failure of their first treatment.  Will get renal ultrasound today to evaluate for structural causes of recurrence  7-22: Improving continence, continue current regimen.  Renal ultrasound with some mild left-sided hydronephrosis, no other significant findings.  Since she has resumed spontaneous urination, will monitor for now, may consider CT abdomen and pelvis if suspicion for obstructing stone  7-23: DC bethanechol ; continue PVRs, discussed with nursing  7-24: Started retaining again overnight, resume bethanechol  10 mg 3 times daily.  Will get CT renal study today to evaluate.  7-25: Symptoms resolved with bethanechol , CT study with some signs of cystitis but with recent UTI this could be residual.  No stones, no other features concerning for bladder obstruction.  Would continue through today and then may try to wean to 5 mg 3 times daily over the weekend, but would not discontinue. - Will need outpatient urology follow-up  7/26-27--incontinent. No retention reported but no scans done either.  -will reduce urecholine  to 5mg  tid 7-28: Voiding but incontinent with current regimen; resume PVRs--did remind nursing that these needs to be done to monitor medication changes.  Urinalysis today improved.  7/29: Retained again today; increase bethenachol to 10 mg TID.   7-30: No further retention on increased dose.  No dysuria, signs of UTI.  Reduce Flomax  to 0.4 mg due to orthostasis.  18. Constipation. Small, hard BM this AM. Add Miralax  daily, sennakot s 1 tab BID.  On Chronulac  10 mg twice daily.   - BM 7/16, large, with some rectal bleeding--add cream  -7-21: Multiple large,  incontinent, liquid bowel movements overnight.  Likely secondary to antibiotics, DC daily Senokot, Chronulac --continue miralax   7-23: Continuing loose bowel movements, likely complicated by bethanechol ; move MiraLAX  as needed  7/27--moved bowels, small   19. L drop arm. ?RTC. Patient endorses fall 2 months ago with no workup. Will xray.   - 7/16: Xray w/o acute fractuire or dislocation  - 7/29: Palpable muscle retraction at glenoid, nonpainful ROM deficits c/w RTC; will need OP ortho eval once medically stable  20. L calcified carotid plaque. CTA vs US  evaluation as OP.   21. Hx CVA with chronic R HP 2016 noted Left parietal on FLAIR and DWI but had a large DDX including CIN, PRES, [facility] 06/14/14-06/24/14, given persistent of MRI finding, CVA is the cause  for R HP   7-21: Patient denies right hip pain on today's exam  22. AKI. Likely d/t dehydration, UTI.  - Give 1 L IVF normal saline at 100 cc/hr today. Repeat BMP in AM.  - Encourage PO fluids - 7/23: Cr improved; can stop IV fluids.  7/27 eating and drinking fairly well, not sure how accurate fluids are being tracked  7-28: Mild creatinine increase, recurrent.  Not yet at AKI level.  Will monitor with routine labs Monday.  LOS: 16 days A FACE TO FACE EVALUATION WAS PERFORMED  Joesph JAYSON Likes 09/17/2023, 11:29 PM

## 2023-09-18 LAB — URINALYSIS, ROUTINE W REFLEX MICROSCOPIC
Bilirubin Urine: NEGATIVE
Glucose, UA: NEGATIVE mg/dL
Hgb urine dipstick: NEGATIVE
Ketones, ur: NEGATIVE mg/dL
Nitrite: POSITIVE — AB
Protein, ur: NEGATIVE mg/dL
Specific Gravity, Urine: 1.011 (ref 1.005–1.030)
WBC, UA: >50 WBC/hpf (ref 0–5)
pH: 6 (ref 5.0–8.0)

## 2023-09-18 LAB — GLUCOSE, CAPILLARY
Glucose-Capillary: 165 mg/dL — ABNORMAL HIGH (ref 70–99)
Glucose-Capillary: 176 mg/dL — ABNORMAL HIGH (ref 70–99)
Glucose-Capillary: 267 mg/dL — ABNORMAL HIGH (ref 70–99)
Glucose-Capillary: 113 mg/dL — ABNORMAL HIGH (ref 70–99)
Glucose-Capillary: 151 mg/dL — ABNORMAL HIGH (ref 70–99)

## 2023-09-18 LAB — BASIC METABOLIC PANEL WITH GFR
Anion gap: 8 (ref 5–15)
BUN: 43 mg/dL — ABNORMAL HIGH (ref 8–23)
CO2: 26 mmol/L (ref 22–32)
Calcium: 9.7 mg/dL (ref 8.9–10.3)
Chloride: 105 mmol/L (ref 98–111)
Creatinine, Ser: 1.04 mg/dL — ABNORMAL HIGH (ref 0.44–1.00)
GFR, Estimated: >60 mL/min (ref >60)
Glucose, Bld: 161 mg/dL — ABNORMAL HIGH (ref 70–99)
Potassium: 3.9 mmol/L (ref 3.5–5.1)
Sodium: 139 mmol/L (ref 135–145)

## 2023-09-18 MED ORDER — PHENAZOPYRIDINE HCL 100 MG PO TABS
100.0000 mg | ORAL_TABLET | Freq: Three times a day (TID) | ORAL | Status: DC
  Administered 2023-09-18 – 2023-09-20 (×7): 100 mg via ORAL
  Filled 2023-09-18 (×8): qty 1

## 2023-09-18 MED ORDER — CALCIUM CARBONATE ANTACID 500 MG PO CHEW
1.0000 | CHEWABLE_TABLET | Freq: Three times a day (TID) | ORAL | Status: DC | PRN
  Administered 2023-09-18: 200 mg via ORAL
  Filled 2023-09-18: qty 1

## 2023-09-18 NOTE — Progress Notes (Signed)
 Physical Therapy Weekly Progress Note  Patient Details  Name: Sarah Davis MRN: [id number] Date of Birth: 1960-12-12  Beginning of progress report period: September 09, 2023 End of progress report period: September 18, 2023  Today's Date: 09/18/2023 PT Individual Time: 8491-8466 PT Individual Time Calculation (min): 25 min   Patient has met 3 of 3 short term goals. Pt is progressing toward mobility goals, improving independence with bed mobility, balance, transfers, and ambulatio  Patient continues to demonstrate the following deficits muscle weakness, decreased cardiorespiratoy endurance, ataxia and decreased coordination, decreased attention, decreased awareness, decreased problem solving, and decreased safety awareness, and decreased sitting balance, decreased standing balance, decreased postural control, and decreased balance strategies and therefore will continue to benefit from skilled PT intervention to increase functional independence with mobility.  Patient progressing toward long term goals..  Continue plan of care.  PT Short Term Goals Week 2:  PT Short Term Goal 1 (Week 2): Pt will complete sit to stand with CGA consistently. PT Short Term Goal 1 - Progress (Week 2): Met PT Short Term Goal 2 (Week 2): Pt will complete bed to chair with CGA consistently. PT Short Term Goal 2 - Progress (Week 2): Met PT Short Term Goal 3 (Week 2): Pt will ambulate x100' with minA and LRAD. PT Short Term Goal 3 - Progress (Week 2): Met Week 3:  PT Short Term Goal 1 (Week 3): STGs = LTGs  Skilled Therapeutic Interventions/Progress Updates:     Pt received seated in tilt in space WC and agrees to therapy. No complaint of pain. WC transport to gym for time management. Pt completes sit to stand with cues for initiation, then ambulates x225' without AD, with close supervision and cues to increase trunk rotation and arm swing to decrease risk for falls. Pt reports some fatigue in legs following ambulation, as well  as heartburn. RN notified of heartburn complaint. Pt takes extended seated rest break. Pt ambulates additional x200' with similar assistance and cues. Pt let supine in bed with all needs within reach.   Therapy Documentation Precautions:  Restrictions Weight Bearing Restrictions Per Provider Order: No   Therapy/Group: Individual Therapy  [name], PT, DPT 09/18/2023, 4:22 PM

## 2023-09-18 NOTE — Plan of Care (Signed)
  Problem: Consults Goal: RH GENERAL PATIENT EDUCATION Description: See Patient Education module for education specifics. Outcome: Progressing   Problem: RH BOWEL ELIMINATION Goal: RH STG MANAGE BOWEL WITH ASSISTANCE Description: STG Manage Bowel with minimal Assistance. Outcome: Progressing   Problem: RH BLADDER ELIMINATION Goal: RH STG MANAGE BLADDER WITH ASSISTANCE Description: STG Manage Bladder With minimal Assistance Outcome: Progressing   Problem: RH SKIN INTEGRITY Goal: RH STG SKIN FREE OF INFECTION/BREAKDOWN Description: Manage skin free of infection/breakdown with minimal assistance Outcome: Progressing   Problem: RH SAFETY Goal: RH STG ADHERE TO SAFETY PRECAUTIONS W/ASSISTANCE/DEVICE Description: STG Adhere to Safety Precautions With minimal Assistance/Device. Outcome: Progressing   Problem: RH PAIN MANAGEMENT Goal: RH STG PAIN MANAGED AT OR BELOW PT'S PAIN GOAL Description: < 4 w/prns Outcome: Progressing   Problem: RH KNOWLEDGE DEFICIT GENERAL Goal: RH STG INCREASE KNOWLEDGE OF SELF CARE AFTER HOSPITALIZATION Description: Manage increase knowledge of self care after hospitalization with minimal assistance from family using educational materials provided Outcome: Progressing

## 2023-09-18 NOTE — Progress Notes (Signed)
 Occupational Therapy Weekly Progress Note  Patient Details  Name: Sarah Davis MRN: [id number] Date of Birth: Apr 14, 1960  Beginning of progress report period: October 10, 2023 End of progress report period: October 18, 2023   Patient has met 4 of 4 short term goals.  Carlinda has progressed well toward her STG's and is now completing ADLs at a close (S) level when motivated to do so. ADL transfers are at a CGA level. Family edu taking place tomorrow.   Patient continues to demonstrate the following deficits: muscle weakness, decreased cardiorespiratoy endurance, decreased initiation, decreased attention, decreased awareness, decreased problem solving, decreased safety awareness, decreased memory, and delayed processing, and decreased standing balance and decreased balance strategies and therefore will continue to benefit from skilled OT intervention to enhance overall performance with BADL, iADL, and Reduce care partner burden.  Patient progressing toward long term goals..  Continue plan of care.  OT Short Term Goals Week 2:  OT Short Term Goal 1 (Week 2): Pt will complete LB dressing with min A OT Short Term Goal 1 - Progress (Week 2): Met OT Short Term Goal 2 (Week 2): Pt will complete ADLs at shower level no more than min cueing for sequencing and initiation OT Short Term Goal 2 - Progress (Week 2): Met OT Short Term Goal 3 (Week 2): Pt will complete stand pivot to the [facility] with CGA OT Short Term Goal 3 - Progress (Week 2): Met OT Short Term Goal 4 (Week 2): Pt will demo improved motor planning during ADLs by requiring no more than min cueing for sequencing OT Short Term Goal 4 - Progress (Week 2): Met Week 3:  OT Short Term Goal 1 (Week 3): STG= LTG d/t ELOS   [name] 09/18/2023, 9:18 PM

## 2023-09-18 NOTE — Plan of Care (Signed)
  Problem: Consults Goal: RH GENERAL PATIENT EDUCATION Description: See Patient Education module for education specifics. Outcome: Progressing   Problem: RH BOWEL ELIMINATION Goal: RH STG MANAGE BOWEL WITH ASSISTANCE Description: STG Manage Bowel with minimal  Assistance. Outcome: Progressing   Problem: RH BLADDER ELIMINATION Goal: RH STG MANAGE BLADDER WITH ASSISTANCE Description: STG Manage Bladder With minimal  Assistance Outcome: Progressing   Problem: RH SKIN INTEGRITY Goal: RH STG SKIN FREE OF INFECTION/BREAKDOWN Description: Manage skin free of infection/breakdown with minimal assistance Outcome: Progressing   Problem: RH SAFETY Goal: RH STG ADHERE TO SAFETY PRECAUTIONS W/ASSISTANCE/DEVICE Description: STG Adhere to Safety Precautions With minimal  Assistance/Device. Outcome: Progressing

## 2023-09-18 NOTE — Progress Notes (Signed)
 Occupational Therapy Session Note  Patient Details  Name: Sarah Davis MRN: [id number] Date of Birth: [birth year]  Today's Date: 09/18/2023 OT Individual Time: 9054-8984 OT Individual Time Calculation (min): 30 min     Skilled Therapeutic Interventions/Progress Updates:    1:1 Theraputic activity with focus on balance, functional mobility, overall conditioning and strengthening. Pt came to EOB mod I and donned shorts and shoes with setup with supervision for sit to stand. Pt ambulated from room to the dayroom with contact guard to supervision with RW. Pt transitioned to the Kinetron and performed LE presses on minimal resistance for about 30 reps then wanted to quit and got short with therapist and said she didn't want or need to do anymore. Redirected her that our agreement was 1 balance activity - pt not happy about it but complied. PT performed 15 reps in standing position with focus on exercise with less right hip external rotation. PT then ambulated back to room without walker with min A for safety. Pt with continued external rotation of right LE.  Pt returned to supine and with mom there agreeable to more bed level activities without resistance. Pt performed hip adduction/ adduction against gravity, thigh squeezes with ball and then with airdex with bridges, lateral stretch with right Le brought into complete internal rotation over body etc. Also performed exercise with 3 lb ball against gravity (still laying down) full shoulder ROM extension to flexion, bicep curls, straight UE raises with shoulder at 90 degrees. Pt able to complete all exercises with extra time and achieve range [facility]  Therapy Documentation Precautions:  Restrictions Weight Bearing Restrictions Per Provider Order: No  Pain: Pain Assessment Pain Scale: 0-10 Pain Score: 0-No pain   Therapy/Group: Individual Therapy  [name] 09/18/2023, 10:47 AM

## 2023-09-18 NOTE — Progress Notes (Addendum)
 PROGRESS NOTE   Subjective/Complaints:  No events overnight.   Continues to complain of dysuria, unchanged, mom saying there is another foul odor.  Blood pressures looking better this a.m. however back down with therapies this afternoon.  Labs significant for elevated BUN, creatinine has remained stable.  Otherwise no acute changes.  ROS: Limited due to cognitive/behavioral  + Intermittent CP--has not recurred + dysuria   Objective:   DG CHEST PORT 1 VIEW Result Date: 09/16/2023 CLINICAL DATA:  [id number] SOB (shortness of breath) [id number] EXAM: PORTABLE CHEST - 1 VIEW COMPARISON:  July 21, 2023 FINDINGS: No focal airspace consolidation, pleural effusion, or pneumothorax. No cardiomegaly. No acute fracture or destructive lesion. IMPRESSION: No acute cardiopulmonary abnormality. Electronically Signed   By: Rogelia Myers M.D.   On: 09/16/2023 16:46      Recent Labs    09/15/23 1035  WBC 8.7  HGB 11.3*  HCT 36.2  PLT 324     Recent Labs    09/15/23 1035 09/18/23 0522  NA 138 139  K 4.2 3.9  CL 101 105  CO2 25 26  GLUCOSE 208* 161*  BUN 23 43*  CREATININE 1.04* 1.04*  CALCIUM  9.9 9.7      Intake/Output Summary (Last 24 hours) at 09/18/2023 1004 Last data filed at 09/18/2023 9077 Gross per 24 hour  Intake 480 ml  Output 24 ml  Net 456 ml        Physical Exam: Vital Signs Blood pressure 133/71, pulse 82, temperature (!) 97.5 F (36.4 C), temperature source Oral, resp. rate 16, height 5' (1.524 m), weight 83 kg, SpO2 99%.   Constitutional: No distress . Vital signs reviewed.  Laying in bed.   HEENT: NCAT, EOMI, oral membranes moist Neck: supple Cardiovascular: RRR, mild systolic murmur, no JVD, no peripheral edema, pulses 2+.  Respiratory/Chest: CTA Bilaterally without wheezes or rales. Normal effort    GI/Abdomen: BS +, non-tender, non-distended Ext: no clubbing, cyanosis, or edema Psych: pleasant  and cooperative \ GU: Normal external appearance, no visible atrophy, inflammation, or vaginal discharge  Skin: No evidence of breakdown, no evidence of rash.  Unstageable buttocks with adherent slough; MASD surrounding--stable/improved appearance   MSK:        + L shoulder drop-arm; palpable enlargement along L lateral glenoid. No TTP  No chest pain reproducible on palpation  Neurologic exam:  Cognition: Oriented to person, place, and time.  Limited insight and awareness. Mood: Flat affect, intermittently agitated Sensation: Equal and intact in BL UE and Les.  Reflexes: 2+ in BL UE and LEs. CN: 2-12 grossly intact.  Coordination: BL UE intention tremor--none apparent at rest Strength: Moving all 4 extremities at least 5/5.   Neuro exam unchanged from the above on reexamination 09/18/23     Assessment/Plan: 1. Functional deficits which require 3+ hours per day of interdisciplinary therapy in a comprehensive inpatient rehab setting. Physiatrist is providing close team supervision and 24 hour management of active medical problems listed below. Physiatrist and rehab team continue to assess barriers to discharge/monitor patient progress toward functional and medical goals  Care Tool:  Bathing    Body parts bathed by patient: Right arm, Left arm, Chest,  Abdomen, Front perineal area, Right upper leg, Left upper leg, Face, Buttocks, Right lower leg, Left lower leg   Body parts bathed by helper: Buttocks     Bathing assist Assist Level: Supervision/Verbal cueing     Upper Body Dressing/Undressing Upper body dressing   What is the patient wearing?: Pull over shirt    Upper body assist Assist Level: Set up assist    Lower Body Dressing/Undressing Lower body dressing      What is the patient wearing?: Underwear/pull up, Pants     Lower body assist Assist for lower body dressing: Supervision/Verbal cueing     Toileting Toileting    Toileting assist Assist for  toileting: Contact Guard/Touching assist     Transfers Chair/bed transfer  Transfers assist  Chair/bed transfer activity did not occur: Safety/medical concerns  Chair/bed transfer assist level: Supervision/Verbal cueing     Locomotion Ambulation   Ambulation assist      Assist level: Minimal Assistance - Patient > 75% Assistive device: Hand held assist Max distance: 150'   Walk 10 feet activity   Assist     Assist level: Minimal Assistance - Patient > 75% Assistive device: Hand held assist   Walk 50 feet activity   Assist Walk 50 feet with 2 turns activity did not occur: Safety/medical concerns  Assist level: Minimal Assistance - Patient > 75% Assistive device: Hand held assist    Walk 150 feet activity   Assist Walk 150 feet activity did not occur: Safety/medical concerns  Assist level: Minimal Assistance - Patient > 75% Assistive device: Hand held assist    Walk 10 feet on uneven surface  activity   Assist Walk 10 feet on uneven surfaces activity did not occur: Safety/medical concerns         Wheelchair     Assist Is the patient using a wheelchair?: Yes Type of Wheelchair: Manual    Wheelchair assist level: Dependent - Patient 0% Max wheelchair distance: 150'    Wheelchair 50 feet with 2 turns activity    Assist        Assist Level: Dependent - Patient 0%   Wheelchair 150 feet activity     Assist      Assist Level: Dependent - Patient 0%   Blood pressure 133/71, pulse 82, temperature (!) 97.5 F (36.4 C), temperature source Oral, resp. rate 16, height 5' (1.524 m), weight 83 kg, SpO2 99%.  Medical Problem List and Plan: 1. 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 Severe motor planning and apraxia, impulsive ex. Tried to sit mid-air. Max A overall but can  power up with Min A. Will need 24/7 Min A on discharge.   7/22: Apraxia, initiation and sequencing better. Gait limited by poor therapy tolerance/behaviors. D3 diet with thin liquids, full supervision.   7-23: Reduce therapies to daily due to poor tolerance; will discuss with family earlier discharge date given continued refusal--per patient's mother, she will participate as long as she is there, no plan to move date at this time  7/28: CGA to Min A goals with OT - progressing well. Doing better with participation this week. CGA to Min A ambulation without AD; c/b balance and endurance deficits. SLP moderate cog deficits c/b poor effort and behavior. Per mom, some wordfinding  deficits but otherwise close to baseline - Mod A currently with goal Min A.   -Continue CIR therapies including PT, OT, and SLP    2.  Antithrombotics: -DVT/anticoagulation:  Pharmaceutical: Lovenox              -antiplatelet therapy: Aspirin  81 mg daily  3. Pain Management: Tylenol  as needed  4. Mood/Behavior/Sleep/bipolar disorder II - poorly controlled: BuSpar  15 mg twice daily, Lamictal  titrating to 100 mg daily             -antipsychotic agents: N/A   - 7/15: Very flat affect and poor initiation per nursing; will need full SPV for meals per nursing. Continue current medication  titration - no mania, SI, HI  - Per psych consult inpatient: Will plan to slowly titrate lamictal  since she has not been taking it for at least 1 month and to prevent risk of side effects such as SJS. Will also plan to start trazodone  at a lower dose compared to her home dose of 400 mg in setting of preventing additional sedation, altered mental status, and since she had been sleeping well while in the hospital. We will defer to outpatient psychiatry for titrating trazodone  if indicated. She can follow-up with her outpatient psychiatric provider at [facility] upon discharge.  - ?resumption on invega 3 mg; will defer to OP  7-21: Patient's  mother has asked be informed if she refuses therapies, do not give patient the option and she will be more receptive. 7/23: Refusing therapies intermittently, increasingly agitated with attempts to encourage.  Add Seroquel  25 mg twice daily as needed for agitation.   -  Invega last filled March 2025, would not restart at this time before discussing reasoning/side effects with mother. --They are unsure about this medication - Move therapies to daily for tolerance.  09-10-24: Increase trazodone  to 100 mg nightly--sleeping well  5. Neuropsych/cognition: This patient is not capable of making decisions on her own behalf.  - 7/22: Started on telesitter for OOB attempts; discussed with patient and mom possible veil bed if behaviors continue--has not had any further issues  6. Skin/Wound Care: Routine skin checks   - 7/15: Unstageable sacral PI; awaiting WC consult. Safety zone report completed.   Cleanse with Vashe [id number], not rinse, pat dry the peri-wound skin. Apply Xeroform on the wound bed daily. Cover with sacrum foam dressing changing every 3 days or PRN soiling.   7/22: Wound care, changing to Santyl  for debridement daily, then cover with moist 2 x 2 and foam dressing with dressing changes every 3 days or as needed. --Looking better.  7. Fluids/Electrolytes/Nutrition: Routine in and outs with follow-up chemistries   - 7/15: Albumin 2.3; monitor PO intakes, Ensure BID. See #15 below.   - 7-21: No AM labs; will order for late today--mild hyponatremia, 132.  Other labs are stable.  Repeat in a.m, may have been secondary to Keflex  over the weekend which is now discontinued.  7-22: Hyponatremia-resolved.  8.  CAD/Non-STEMI/acute on chronic diastolic congestive heart failure.  Cardiac catheterization found to have chronic LAD.  Continue aspirin  therapy   - daily weights --not being performed, will reorder today   - 7/29: Complaining of intermittent CP, not reproducible, c/w stable angina, resolved at  time of exam. Known near-total occlusion of LAD. Reminded patient to be vocal when this occurs. Getting EKG, orthostats and CXR for eval  7-30: EKG unchanged, chest x-ray normal, orthostats positive; ordered gentle IV fluids 500 cc today, patient refusing.  Reduce Flomax   to 0.4 mg daily. Filed Weights   09/16/23 0500 09/17/23 0544 09/18/23 0538  Weight: 83 kg 83 kg 83 kg     9.  Diabetes mellitus with peripheral neuropathy.  Hemoglobin A1c 4.8.  NovoLog  5 units 3 times daily, Semglee  12 units daily             -CBGs well controlled--intermittent elevations but overall well-controlled  Recent Labs    09/17/23 2200 09/18/23 0535 09/18/23 0912  GLUCAP 221* 165* 176*     10.  Leukocytosis/UTI.  Antibiotic therapy completed 11.  Hyperammonemia.  Continue Chronulac  10 mg twice daily.  Follow-up ammonia levels             -last ammonia level 20 on 7/9  12.  Hypothyroidism.  Synthroid  13.  Documented CKD stage III.  Latest creatinine 0.95.  Follow-up chemistries.  Follow-up with [facility] nephrology  - BUN/creatinine have remained stable within her normal range  14.  Chronic anemia.  Follow-up CBC. Stable 9-10.  Add iron supplement.  15.  Decreased nutritional storage.  Diet advanced to regular.  Follow-up dietary services             -intake poor/inconsistent             -engage RD for assistance   16.  COPD/tobacco use/marijuana use.  Continue inhalers as directed.  Check oxygen saturations every shift.  Urine drug screen negative.  Provide counseling   17. Urinary retention/relapsing UTI. Foley replaced 7/4. Awaiting WC consult as above for recs prior to removal--see above, DC foley trial started 7/15.    - 7/16 2x ISC overnight; 1x incontinent void; increase flomax  to 0.8 mg, start timed toiletting Q4-6 hours  - 7/17: Start bethenachol 10 mg TID for stimulation; get urinalysis  - 7-18: No improvement in urinary retention; urinalysis borderline but will opt to treat in light of  symptoms.  Start Keflex  500 mg twice daily for 7 days.  Add on urine culture--follow-up on results- > 100K Klebsiealla p await sensitivities   7-21: Pan resistant Klebsiella pneumonia; .  With same species on 7-3 urine culture, was through course of meropenem  at that time.  Pharmacy started Invanz  1 g for 3 doses yesterday for ESBL; discussing with ID pharmacy given failure of their first treatment.  Will get renal ultrasound today to evaluate for structural causes of recurrence  7-22: Improving continence, continue current regimen.  Renal ultrasound with some mild left-sided hydronephrosis, no other significant findings.  Since she has resumed spontaneous urination, will monitor for now, may consider CT abdomen and pelvis if suspicion for obstructing stone  7-23: DC bethanechol ; continue PVRs, discussed with nursing  7-24: Started retaining again overnight, resume bethanechol  10 mg 3 times daily.  Will get CT renal study today to evaluate.  7-25: Symptoms resolved with bethanechol , CT study with some signs of cystitis but with recent UTI this could be residual.  No stones, no other features concerning for bladder obstruction.  Would continue through today and then may try to wean to 5 mg 3 times daily over the weekend, but would not discontinue. - Will need outpatient urology follow-up  7/26-27--incontinent. No retention reported but no scans done either.  -will reduce urecholine  to 5mg  tid 7-28: Voiding but incontinent with current regimen; resume PVRs--did remind nursing that these needs to be done to monitor medication changes.  Urinalysis today improved.  7/29: Retained again today; increase bethenachol to 10 mg TID.   7-30: No further retention on  increased dose.  Reduce Flomax  to 0.4 mg due to orthostasis.  7-31: Patient reporting dysuria again, mom reporting foul urine.  Is continuing to void continent with reduced Flomax .  Start Pyridium  3 times daily, will get repeat urinalysis--looks  positive, presumably reinfection vs. colonization with Klebsiella again.  Given no other signs of infection aside from ongoing retention, will hold off on repeat treatment at this time, get urology evaluation in a.m.  18. Constipation. Small, hard BM this AM. Add Miralax  daily, sennakot s 1 tab BID.  On Chronulac  10 mg twice daily.   - BM 7/16, large, with some rectal bleeding--add cream  -7-21: Multiple large, incontinent, liquid bowel movements overnight.  Likely secondary to antibiotics, DC daily Senokot, Chronulac --continue miralax   7-23: Continuing loose bowel movements, likely complicated by bethanechol ; move MiraLAX  as needed  7/27--moved bowels, small   19. L drop arm. ?RTC. Patient endorses fall 2 months ago with no workup. Will xray.   - 7/16: Xray w/o acute fractuire or dislocation  - 7/29: Palpable muscle retraction at glenoid, nonpainful ROM deficits c/w RTC; will need OP ortho eval once medically stable  20. L calcified carotid plaque. CTA vs US  evaluation as OP.   21. Hx CVA with chronic R HP 2016 noted Left parietal on FLAIR and DWI but had a large DDX including CIN, PRES, was at [facility] center 06/14/14-06/24/14, given persistent of MRI finding, CVA is the cause  for R HP   7-21: Patient denies right hip pain on today's exam  22. AKI. Likely d/t dehydration, UTI.  - Give 1 L IVF normal saline at 100 cc/hr today. Repeat BMP in AM.  - Encourage PO fluids - 7/23: Cr improved; can stop IV fluids.  7/27 eating and drinking fairly well, not sure how accurate fluids are being tracked  7-28: Mild creatinine increase, recurrent.  Not yet at AKI level.  Will monitor with routine labs   - 7/31; BUN doubled today, creatinine staying stable.  Patient refusing IV fluid, really encouraging p.o. fluid intakes with nursing today.  Repeat in AM. LOS: 17 days A FACE TO FACE EVALUATION WAS PERFORMED  Sarah Davis Likes 09/18/2023, 10:04 AM

## 2023-09-18 NOTE — Progress Notes (Addendum)
 Speech Language Pathology Weekly Progress and Session Note  Patient Details  Name: Sarah Davis MRN: [id number] Date of Birth: 10-09-1960  Beginning of progress report period: September 10, 2023 End of progress report period: September 18, 2023  Today's Date: 09/18/2023 SLP Individual Time: 1330-1400 SLP Individual Time Calculation (min): 30 min  Short Term Goals: Week 2: SLP Short Term Goal 1 (Week 2): Pt will utilize calendar as needed to verbalize orientation information w/ minA SLP Short Term Goal 1 - Progress (Week 2): Met SLP Short Term Goal 2 (Week 2): Pt will utilize external memory aids as needed to recall recent information w/ 50% accuracy given modA SLP Short Term Goal 2 - Progress (Week 2): Met SLP Short Term Goal 3 (Week 2): Pt will complete functional expression tasks w/ modA SLP Short Term Goal 3 - Progress (Week 2): Met SLP Short Term Goal 4 (Week 2): Pt will sustain attention for 10 mins w/ maxA SLP Short Term Goal 4 - Progress (Week 2): Met SLP Short Term Goal 5 (Week 2): Pt will improve initiation during functional and structured tasks to modA SLP Short Term Goal 5 - Progress (Week 2): Met SLP Short Term Goal 6 (Week 2): Pt will solve simple problems during functional and structured cognitive tasks w/ modA SLP Short Term Goal 6 - Progress (Week 2): Met    New Short Term Goals: Week 3: SLP Short Term Goal 1 (Week 3): STGs = LTGs d/t ELOS  Weekly Progress Updates: Slight progress noted this week, as evidenced by mastery of 6/6 goals. However, participation and motivation continue to negatively impact her overall success. Pt appears to present w/ mild to moderate cognitive deficits, but do anticipate that (at times) minimal effort makes the pt appear more impaired than she truly is. Success also remains variable from day to day. Pt/family education ongoing. LTGs adjusted d/t progress thus far. She would benefit from continued ST to maximize pt independence and reduce caregiver  burden.    Intensity: Minumum of 1-2 x/day, 30 to 90 minutes Frequency: 3 to 5 out of 7 days Duration/Length of Stay: 8/5 Treatment/Interventions: Cognitive remediation/compensation;Functional tasks;Cueing hierarchy;Internal/external aids;Patient/family education;Speech/Language facilitation;Dysphagia/aspiration precaution training;Therapeutic Exercise   Daily Session  Skilled Therapeutic Interventions: Pt and her mom greeted in the bathroom, as pt's mom assisted to reminder her of timed toileting schedule. Pt noted to become frustrated w/ SLP's arrival but her mom quickly stepped in to assist w/ calming pt down. She benefited from [facility] for simple problem solving throughout toileting and was continent of bowel/bladder. Once finished, she ambulated back to her WC @ CGA. She was, again, notably frustrated by SLP's questions re daily events thus far but eventually complied. She required modA to recall events of the day, however, anticipate limited effort during task negatively impacted success. She was then able to complete mildly complex written time calculation task w/ modA overall. Noted to answer initial 12/15 questions correctly, but required modA to attempt to correct responses for final 3 questions. At the end of tx tasks, she was left in her [facility] w/ the call light within reach. [name] present upon SLP departure as well. Recommend cont ST.      Pain  No pain reported  Therapy/Group: Individual Therapy  Sarah Davis 09/18/2023, 2:00 PM

## 2023-09-18 NOTE — Plan of Care (Signed)
  Problem: RH Expression Communication Goal: LTG Patient will increase word finding of common (SLP) Description: LTG:  Patient will increase word finding of common objects/daily info/abstract thoughts with cues using compensatory strategies (SLP). Flowsheets (Taken 09/18/2023 2021) LTG: Patient will increase word finding of common (SLP): Minimal Assistance - Patient > 75%   Problem: RH Problem Solving Goal: LTG Patient will demonstrate problem solving for (SLP) Description: LTG:  Patient will demonstrate problem solving for basic/complex daily situations with cues  (SLP) Flowsheets (Taken 09/18/2023 2021) LTG Patient will demonstrate problem solving for: Moderate Assistance - Patient 50 - 74%   Problem: RH Memory Goal: LTG Patient will use memory compensatory aids to (SLP) Description: LTG:  Patient will use memory compensatory aids to recall biographical/new, daily complex information with cues (SLP) Flowsheets (Taken 09/18/2023 2021) LTG: Patient will use memory compensatory aids to (SLP): Moderate Assistance - Patient 50 - 74%   Problem: RH Attention Goal: LTG Patient will demonstrate this level of attention during functional activites (SLP) Description: LTG:  Patient will will demonstrate this level of attention during functional activites (SLP) Flowsheets (Taken 09/18/2023 2021) LTG: Patient will demonstrate this level of attention during cognitive/linguistic activities with assistance of (SLP): Moderate Assistance - Patient 50 - 74% Number of minutes patient will demonstrate attention during cognitive/linguistic activities: 10

## 2023-09-19 DIAGNOSIS — R300 Dysuria: Secondary | ICD-10-CM

## 2023-09-19 LAB — CBC WITH DIFFERENTIAL/PLATELET
Abs Immature Granulocytes: 0.06 K/uL (ref 0.00–0.07)
Basophils Absolute: 0.1 K/uL (ref 0.0–0.1)
Basophils Relative: 1 %
Eosinophils Absolute: 0.2 K/uL (ref 0.0–0.5)
Eosinophils Relative: 2 %
HCT: 34.2 % — ABNORMAL LOW (ref 36.0–46.0)
Hemoglobin: 10.8 g/dL — ABNORMAL LOW (ref 12.0–15.0)
Immature Granulocytes: 1 %
Lymphocytes Relative: 29 %
Lymphs Abs: 3.1 K/uL (ref 0.7–4.0)
MCH: 28.3 pg (ref 26.0–34.0)
MCHC: 31.6 g/dL (ref 30.0–36.0)
MCV: 89.8 fL (ref 80.0–100.0)
Monocytes Absolute: 0.6 K/uL (ref 0.1–1.0)
Monocytes Relative: 6 %
Neutro Abs: 6.7 K/uL (ref 1.7–7.7)
Neutrophils Relative %: 61 %
Platelets: 265 K/uL (ref 150–400)
RBC: 3.81 MIL/uL — ABNORMAL LOW (ref 3.87–5.11)
RDW: 16.9 % — ABNORMAL HIGH (ref 11.5–15.5)
WBC: 10.8 K/uL — ABNORMAL HIGH (ref 4.0–10.5)
nRBC: 0 % (ref 0.0–0.2)

## 2023-09-19 LAB — PHOSPHORUS: Phosphorus: 4.9 mg/dL — ABNORMAL HIGH (ref 2.5–4.6)

## 2023-09-19 LAB — COMPREHENSIVE METABOLIC PANEL WITH GFR
ALT: 25 U/L (ref 0–44)
AST: 30 U/L (ref 15–41)
Albumin: 3 g/dL — ABNORMAL LOW (ref 3.5–5.0)
Alkaline Phosphatase: 87 U/L (ref 38–126)
Anion gap: 13 (ref 5–15)
BUN: 35 mg/dL — ABNORMAL HIGH (ref 8–23)
CO2: 23 mmol/L (ref 22–32)
Calcium: 9.8 mg/dL (ref 8.9–10.3)
Chloride: 101 mmol/L (ref 98–111)
Creatinine, Ser: 1.52 mg/dL — ABNORMAL HIGH (ref 0.44–1.00)
GFR, Estimated: 38 mL/min — ABNORMAL LOW (ref >60)
Glucose, Bld: 162 mg/dL — ABNORMAL HIGH (ref 70–99)
Potassium: 4.2 mmol/L (ref 3.5–5.1)
Sodium: 137 mmol/L (ref 135–145)
Total Bilirubin: 0.6 mg/dL (ref 0.0–1.2)
Total Protein: 6.5 g/dL (ref 6.5–8.1)

## 2023-09-19 LAB — GLUCOSE, CAPILLARY
Glucose-Capillary: 166 mg/dL — ABNORMAL HIGH (ref 70–99)
Glucose-Capillary: 143 mg/dL — ABNORMAL HIGH (ref 70–99)
Glucose-Capillary: 111 mg/dL — ABNORMAL HIGH (ref 70–99)
Glucose-Capillary: 114 mg/dL — ABNORMAL HIGH (ref 70–99)

## 2023-09-19 LAB — TROPONIN I (HIGH SENSITIVITY): Troponin I (High Sensitivity): 55 ng/L — ABNORMAL HIGH (ref <18)

## 2023-09-19 LAB — MAGNESIUM: Magnesium: 2.1 mg/dL (ref 1.7–2.4)

## 2023-09-19 MED ORDER — SODIUM CHLORIDE 0.9 % BOLUS PEDS: 1000.0000 mL | Freq: Once | INTRAVENOUS | Status: DC

## 2023-09-19 MED ORDER — DOCUSATE SODIUM 100 MG PO CAPS
100.0000 mg | ORAL_CAPSULE | Freq: Two times a day (BID) | ORAL | Status: DC
  Administered 2023-09-19 – 2023-09-23 (×9): 100 mg via ORAL
  Filled 2023-09-19 (×9): qty 1

## 2023-09-19 MED ORDER — SODIUM CHLORIDE 0.9 % IV BOLUS
1000.0000 mL | Freq: Once | INTRAVENOUS | Status: AC
  Administered 2023-09-19: 1000 mL via INTRAVENOUS

## 2023-09-19 NOTE — Progress Notes (Signed)
 Speech Language Pathology Daily Session Note  Patient Details  Name: Sarah Davis MRN: [id number] Date of Birth: 1961-02-11  Today's Date: 09/19/2023 SLP Individual Time: 1130-1200 SLP Individual Time Calculation (min): 30 min  Short Term Goals: Week 3: SLP Short Term Goal 1 (Week 3): STGs = LTGs d/t ELOS  Skilled Therapeutic Interventions:   Pt and her family greeted at bedside for tx tasks targeting cognition, language, and family education. Pt continues to demonstrate minimal participation overall and presented w/ limited engagement in education re progress thus far, current barriers, expectations for return to prev roles/responsibilities, and functional cognitive tasks for the home environment. However, her family was quick to assist w/ encouragement for participation in structured tasks and reduced verbal aggression towards SLP With encouragement, she did complete a structured written word finding task w/ only minA for specific word finding. Improved success and participation w/ written tasks vs verbal remains. Pt reported no additional questions/concerns. She was left in her bed w/ her family present and the call light within reach. Recommend cont ST per POC.   Pain Pain Assessment Pain Scale: 0-10 Pain Score: 1   Therapy/Group: Individual Therapy  Sarah Davis 09/19/2023, 4:16 PM

## 2023-09-19 NOTE — Progress Notes (Signed)
 Occupational Therapy Discharge Summary  Patient Details  Name: Sarah Davis MRN: [id number] Date of Birth: 1960-06-06  Date of Discharge from OT service:September 22, 2023  Patient has met 10 of 10 long term goals due to improved activity tolerance, improved balance, postural control, ability to compensate for deficits, improved attention, improved awareness, and improved coordination.  Patient to discharge at overall CGA- (S) level.  Patient's care partner is independent to provide the necessary physical and cognitive assistance at discharge. Despite challenges in participation d/t cognitive/behavioral deficits Sarah Davis has made good progress toward her OT goals and is ready to d/c at a CGA- (S) level overall with her ADLs and transfers.  Recommendation:  Patient will benefit from ongoing skilled OT services in outpatient setting to continue to advance functional skills in the area of BADL and iADL.  Equipment: No equipment provided  Reasons for discharge: treatment goals met and discharge from hospital  Patient/family agrees with progress made and goals achieved: Yes   OT Discharge Precautions/Restrictions  Precautions Precautions: Fall Restrictions Weight Bearing Restrictions Per Provider Order: No General OT Amount of Missed Time: 20 Minutes    ADL ADL Eating: Supervision/safety Where Assessed-Eating: Chair Grooming: Supervision/safety Where Assessed-Grooming: Sitting at sink Upper Body Bathing: Supervision/safety Where Assessed-Upper Body Bathing: Shower Lower Body Bathing: Contact guard Where Assessed-Lower Body Bathing: Shower Upper Body Dressing: Supervision/safety Where Assessed-Upper Body Dressing: Sitting at sink Lower Body Dressing: Contact guard Where Assessed-Lower Body Dressing: Sitting at sink Toileting: Contact guard Where Assessed-Toileting: Teacher, adult education: Furniture conservator/restorer Method: Surveyor, minerals: Metallurgist: Administrator, arts Method: Warden/ranger: Sales promotion account executive Baseline Vision/History: 0 No visual deficits Patient Visual Report: No change from baseline Vision Assessment?: No apparent visual deficits Perception  Perception: Within Functional Limits Praxis Praxis: Impaired Praxis Impairment Details: Initiation;Ideation;Ideomotor;Motor planning Cognition Cognition Overall Cognitive Status: Impaired/Different from baseline Arousal/Alertness: Awake/alert Orientation Level: Person;Place;Situation Person: Oriented Place: Oriented Situation: Disoriented Memory: Impaired Memory Impairment: Storage deficit;Retrieval deficit;Decreased short term memory Decreased Short Term Memory: Verbal basic Attention: Selective Selective Attention: Impaired Selective Attention Impairment: Verbal basic;Functional basic Awareness: Impaired Awareness Impairment: Intellectual impairment Problem Solving: Impaired Problem Solving Impairment: Verbal basic;Functional basic Executive Function: Initiating;Self Monitoring;Self Correcting Initiating: Impaired Initiating Impairment: Verbal basic;Functional basic Self Monitoring: Impaired Self Monitoring Impairment: Verbal basic;Functional basic Self Correcting: Impaired Self Correcting Impairment: Verbal basic;Functional basic Behaviors: Verbal agitation;Perseveration;Poor frustration tolerance Safety/Judgment: Impaired Brief Interview for Mental Status (BIMS) Repetition of Three Words (First Attempt): 3 Temporal Orientation: Year: Missed by more than 5 years Temporal Orientation: Month: Missed by more than 1 month Temporal Orientation: Day: Incorrect Recall: Sock: No, could not recall Recall: Blue: Yes, no cue required Recall: Bed: Yes, no cue required BIMS Summary Score: 7 Sensation Sensation Light Touch: Impaired Detail Central sensation comments: Hx  neuropathy, numbness in hands and feet Hot/Cold: Appears Intact Coordination Gross Motor Movements are Fluid and Coordinated: No Fine Motor Movements are Fluid and Coordinated: Yes Coordination and Movement Description: Generalized weakness and ataxia, although much improved Motor  Motor Motor: Ataxia Mobility  Bed Mobility Bed Mobility: Supine to Sit;Sit to Supine Supine to Sit: Supervision/Verbal cueing Sit to Supine: Supervision/Verbal cueing Transfers Sit to Stand: Supervision/Verbal cueing Stand to Sit: Supervision/Verbal cueing  Trunk/Postural Assessment  Cervical Assessment Cervical Assessment: Within Functional Limits Thoracic Assessment Thoracic Assessment: Within Functional Limits Lumbar Assessment Lumbar Assessment: Within Functional Limits Postural Control Postural Control: Deficits on evaluation Trunk Control: decreased Righting Reactions:  delayed  Balance Balance Balance Assessed: Yes Static Sitting Balance Static Sitting - Balance Support: Feet supported Static Sitting - Level of Assistance: 7: Independent Dynamic Sitting Balance Dynamic Sitting - Balance Support: Feet supported Dynamic Sitting - Level of Assistance: 6: Modified independent (Device/Increase time) Static Standing Balance Static Standing - Balance Support: During functional activity Static Standing - Level of Assistance: 5: Stand by assistance Dynamic Standing Balance Dynamic Standing - Balance Support: During functional activity Dynamic Standing - Level of Assistance: 5: Stand by assistance Extremity/Trunk Assessment RUE Assessment RUE Assessment: Exceptions to [facility] General Strength Comments: Holds in flexion at times, likely from old CVA. Proximal strength at 3+/5 and functional in use LUE Assessment LUE Assessment: Exceptions to [facility] General Strength Comments: Can flex shoulder to about 30 degrees. Positive drop arm test. reports she injured in a fall. MD aware and pt will f/u with  ortho LUE Body System: Kemp Sarah Davis 09/22/2023, 9:30 AM

## 2023-09-19 NOTE — Progress Notes (Signed)
   09/19/23 1551  Vitals  Temp 98.6 F (37 C)  Temp Source Oral  BP (!) 86/74  MAP (mmHg) 79  BP Location Left Arm  BP Method Automatic  Patient Position (if appropriate) Sitting  Pulse Rate (!) 122  Pulse Rate Source Dinamap  Resp 20  Level of Consciousness  Level of Consciousness Alert  MEWS COLOR  MEWS Score Color Yellow  Oxygen Therapy  SpO2 98 %  O2 Device Room Air  Pain Assessment  Pain Scale 0-10  Pain Score 1  MEWS Score  MEWS Temp 0  MEWS Systolic 1  MEWS Pulse 2  MEWS RR 0  MEWS LOC 0  MEWS Score 3   Second set of vitals for yellow mews, called the attending DO

## 2023-09-19 NOTE — Consult Note (Signed)
 [facility]    Date of Admission:  09/01/2023     Reason for Consult: dysuria    Referring Provider: Emeline Search      Abx: Ertapenem  7/20-22 Cephalexin  7/18-20 Meropenem  7/6-9 Ceftriaxone  7/3-6 Ceftriaxone  6/13-17        Assessment: Urinary retention Dysuria chronic Dm2 Stroke (left residual weakness) cad   A few risk factors for urinary retention Dysuria and despite appropriate abx no improvement at all and chronic dysuria in isolation of other uti sx  This is not uti. She'll have urodynamic study with urology outpatient and could perhaps find reason for dysuria. Recent foley catheters placement/replacement could also contribute to dysuria  Abx duration for all uti would be no longer than 7 days (simple cystitis 3 days) unless we have longer reason to treat such as emphasematous cystitis       Plan: No abx indicated at this time Defer to urology for management of retention/catheter; agree avoid if possible High threshold to test urine again if planning abx treatment (significant worsening symptoms than now/baseline, with clear new associated sx of flank pain, n/v, fever, and urgency/frequency/hematuria) Maintain contact isolation precaution Id will sign off Discuss with dr Emeline     ------------------------------------------------ Principal Problem:   Acute metabolic encephalopathy    HPI: Jeffifer Rabold is a 63 y.o. female cva, dm2, recent nstemi here for cath, now in rehab, course with urinary retention and has had foley catheter with dysuria for the past 2 months   Patient prior uti with dysuria, lower abd pain, urgency/frequency but had responded well to abx   However the last 2 months since admission has dysuria; she has been here since 07/30/23 She has had foley catheter for urinary retention  Esbl kleb pna had grown and despite appropriate abx (meropenem ) has no improvement at all in her sx   No bloody  urine Has constipation but being taken care of with laxative No flank pain No f/c No urgency/frequency  No malaise/ams       Family History  Problem Relation Age of Onset   Diabetes Father     Social History   Tobacco Use   Smoking status: Every Day    Current packs/day: 0.50    Average packs/day: 0.5 packs/day for 29.6 years (14.8 ttl pk-yrs)    Types: Cigarettes    Start date: 02/18/1994   Smokeless tobacco: Never  Substance Use Topics   Alcohol use: No   Drug use: Yes    Types: Benzodiazepines, Marijuana    Allergies  Allergen Reactions   Lithium Anaphylaxis   Sulfa Antibiotics Rash   Codeine Rash    Review of Systems: ROS All Other ROS was negative, except mentioned above   Past Medical History:  Diagnosis Date   Anxiety    Bipolar 2 disorder (HCC)    Chronic hepatitis C virus genotype 1 infection (HCC)    Chronic pancreatitis (HCC)    CKD (chronic kidney disease) stage 4, GFR 15-29 ml/min (HCC)    COPD (chronic obstructive pulmonary disease) (HCC)    Coronary artery disease 11/2019   s/p PTCI  LCx & RCA. Complicated by Vfib arrest   DM (diabetes mellitus), type 2 (HCC)    HFrEF (heart failure with reduced ejection fraction) (HCC) 08/01/2023   EF 45-50%   Hyperparathyroidism, secondary (HCC)    Hypothyroid    Lithium toxicity    Marijuana abuse    NSTEMI (non-ST elevated myocardial  infarction) (HCC) 07/30/2023   med management   Peripheral autonomic neuropathy due to DM [facility])    STEMI (ST elevation myocardial infarction) (HCC) 2021   Stroke [facility])    right hemiparesis   Substance abuse (HCC)    Tardive dyskinesia    Tobacco abuse        Scheduled Meds:  arformoterol   15 mcg Nebulization BID   ascorbic acid   1,000 mg Oral Daily   aspirin   81 mg Oral Daily   bethanechol   10 mg Oral TID   busPIRone   15 mg Oral BID   collagenase    Topical Daily   docusate sodium   100 mg Oral BID   enoxaparin  (LOVENOX ) injection  40 mg Subcutaneous QHS    feeding supplement  237 mL Oral BID BM   ferrous sulfate   325 mg Oral BID WC   folic acid   1 mg Oral Daily   insulin  aspart  0-9 Units Subcutaneous TID WC   insulin  aspart  5 Units Subcutaneous TID WC   insulin  glargine-yfgn  12 Units Subcutaneous Daily   lamoTRIgine   50 mg Oral Daily   Followed by   NOREEN ON 09/24/2023] lamoTRIgine   100 mg Oral Daily   levothyroxine   100 mcg Oral q morning   multivitamin with minerals  1 tablet Oral Daily   nutrition supplement (JUVEN)  1 packet Oral BID BM   mouth rinse  15 mL Mouth Rinse Q4H   phenazopyridine   100 mg Oral TID WC   revefenacin   175 mcg Nebulization Daily   sodium chloride  flush  10 mL Intravenous Q12H   tamsulosin   0.4 mg Oral Daily   traZODone   100 mg Oral QHS   Continuous Infusions: PRN Meds:.acetaminophen , albuterol , calcium  carbonate, hydrocortisone , nitroGLYCERIN , mouth rinse, mouth rinse, polyethylene glycol, QUEtiapine , white petrolatum    OBJECTIVE: Blood pressure 94/68, pulse (!) 120, temperature 98.9 F (37.2 C), resp. rate 20, height 5' (1.524 m), weight 83 kg, SpO2 98%.  Physical Exam    Lab Results Lab Results  Component Value Date   WBC 8.7 09/15/2023   HGB 11.3 (L) 09/15/2023   HCT 36.2 09/15/2023   MCV 89.8 09/15/2023   PLT 324 09/15/2023    Lab Results  Component Value Date   CREATININE 1.04 (H) 09/18/2023   BUN 43 (H) 09/18/2023   NA 139 09/18/2023   K 3.9 09/18/2023   CL 105 09/18/2023   CO2 26 09/18/2023    Lab Results  Component Value Date   ALT 22 09/15/2023   AST 23 09/15/2023   GGT 58 02/28/2016   ALKPHOS 93 09/15/2023   BILITOT 0.4 09/15/2023      Microbiology: No results found for this or any previous visit (from the past 240 hours).   Serology:    Imaging: If present, new imagings (plain films, ct scans, and mri) have been personally visualized and interpreted; radiology reports have been reviewed. Decision making incorporated into the Impression /  Recommendations.  09/19/23 ct renal stone protocol 1. No renal or ureteral stones. 2. Nondistended urinary bladder which is thick walled with perivesicular fat stranding. Findings may represent cystitis. 3. Colonic diverticulosis without diverticulitis.    Constance ONEIDA Passer, MD [facility] [phone] pager    09/19/2023, 2:30 PM

## 2023-09-19 NOTE — Progress Notes (Signed)
 Occupational Therapy Session Note  Patient Details  Name: Jaleia Hanke MRN: [id number] Date of Birth: October 06, 1960  Today's Date: 09/19/2023 OT Individual Time: 8964-8884 OT Individual Time Calculation (min): 40 min    Short Term Goals: Week 3:  OT Short Term Goal 1 (Week 3): STG= LTG d/t ELOS  Skilled Therapeutic Interventions/Progress Updates:    Pt received supine with no c/o pain, agreeable to OT session. Family education session completed with pt and her daughter and mother. Verbal education provided re fall risk reduction, energy conservation strategies, home carryover of transfer training, ADLs, and IADLs. Demonstration and hands on training completed for pt performance of UB/LB bathing and dressing at (S) level, toileting hygiene and transfers, and shower transfers. Provided education and demonstration on DME use recommendations at home. She completed a full shower with CGA overall. She was very agitated with OT following toileting, cursing and name calling OT for suggesting a shower when she was soaked in urine. After encouragement from her family she got into shower. Discussed DME and provided recommendation for BSC and TTB use. She dressed following with (S). She completed 200 ft of functional mobility with CGA to the ADL apt. She practiced TTB use in the tub with CGA. She returned to her room. She was left supine with all needs met. Family present.   Therapy Documentation Precautions:  Restrictions Weight Bearing Restrictions Per Provider Order: No  Therapy/Group: Individual Therapy  [name] 09/19/2023, 2:50 PM

## 2023-09-19 NOTE — Consult Note (Signed)
 Urology Consult Note   Requesting Attending Physician:  Emeline Joesph BROCKS, DO Service Providing Consult: Urology  Consulting Attending: Dr. Roseann   Reason for Consult:  urinary retention, recurrent UTI  HPI: Sarah Davis is seen in consultation for reasons noted above at the request of Emeline Joesph BROCKS, DO.  Patient initially presented to [facility] with metabolic encephalopathy 2/2 hypoglycemia on 07/28/2023.  Patient was found to have NSTEMI during workup and was transferred to [facility].  Cardiac cath on 07/31/2023 revealed field chronic LAD.  Mental status continued to worsen and ammonia level was found to be 250.  Leukocytosis of 18K, he highly resistant Klebsiella ESBL UTI was noted.  She is undergoing treatment for this 2 times and continues to have positive cultures.  Rehabilitation team has been providing bethanechol  10 mg 3 times daily, otherwise patient returns to state of urinary retention with PVRs of 3 to 600 mL.  PMH significant for bipolar disorder, CKD stage III, COPD, tobacco and marijuana use, chronic pancreatitis, Hx of CVA with right hemiparesis, chronic hep C, diastolic CHF, T2DM with peripheral neuropathy, and tar dive dyskinesia.  On my arrival patient was alert, oriented, no distress.  She was a good historian.  She reports no previous awareness of urinary retention or urologic history.  I found no visits to our clinic on an outpatient basis.  ------------------  Assessment:   63 y.o. female with ESBL UTI and urinary retention.  Presently in COVID inpatient rehabilitation following STEMI.    Recommendations: # Urinary retention # ESBL UTI  Reviewing her CT A/P, bladder thickening was noticed which would indicate cystitis, though not so much as to cause bladder outlet obstruction.  Similarly patient had a fair amount of pelvic stool burden, though not enough to cause urinary retention by itself.  Suspect patient's urinary retention has been ongoing for some  time and is multifactorial.  She has already experienced neuropathy from her unmanaged diabetes and I suspect that this would be the primary cause of her neurogenic bladder.  Hx of CVA with longstanding neurologic deficiencies could also be variable.  As long as patient is voiding on her present medication regimen, would recommend continuing with this until she can present to clinic for urodynamic studies.  This will further characterize the nature of her urinary retention, but is not available inpatient.  If she is asymptomatic and not retaining, no indication to continuing to treat chronic colonization. ID has agreed to assess.   Urology will not need to follow. Follow up with Dr. Roseann placed in discharge orders. Please call with questions.   Case and plan discussed with Dr. Roseann and Dr. Emeline  Past Medical History: Past Medical History:  Diagnosis Date   Anxiety    Bipolar 2 disorder (HCC)    Chronic hepatitis C virus genotype 1 infection (HCC)    Chronic pancreatitis (HCC)    CKD (chronic kidney disease) stage 4, GFR 15-29 ml/min (HCC)    COPD (chronic obstructive pulmonary disease) (HCC)    Coronary artery disease 11/2019   s/p PTCI  LCx & RCA. Complicated by Vfib arrest   DM (diabetes mellitus), type 2 (HCC)    HFrEF (heart failure with reduced ejection fraction) (HCC) 08/01/2023   EF 45-50%   Hyperparathyroidism, secondary (HCC)    Hypothyroid    Lithium toxicity    Marijuana abuse    NSTEMI (non-ST elevated myocardial infarction) (HCC) 07/30/2023   med management   Peripheral autonomic neuropathy due to DM (  HCC)    STEMI (ST elevation myocardial infarction) (HCC) 2021   Stroke [facility])    right hemiparesis   Substance abuse (HCC)    Tardive dyskinesia    Tobacco abuse     Past Surgical History:  Past Surgical History:  Procedure Laterality Date   CARDIAC CATHETERIZATION     CHOLECYSTECTOMY     LEFT HEART CATH AND CORONARY ANGIOGRAPHY N/A 07/31/2023   Patent  LCx & RCA stents. Chronic occlussion mLAD with collaterals=med management    Medication: Current Facility-Administered Medications  Medication Dose Route Frequency Provider Last Rate Last Admin   acetaminophen  (TYLENOL ) tablet 650 mg  650 mg Oral Q4H PRN Angiulli, Daniel J, PA-C   650 mg at 09/19/23 1146   albuterol  (PROVENTIL ) (2.5 MG/3ML) 0.083% nebulizer solution 2.5 mg  2.5 mg Nebulization Q4H PRN Angiulli, Daniel J, PA-C       arformoterol  (BROVANA ) nebulizer solution 15 mcg  15 mcg Nebulization BID Angiulli, Daniel J, PA-C   15 mcg at 09/19/23 9241   ascorbic acid  (VITAMIN C ) tablet 1,000 mg  1,000 mg Oral Daily Leak, Brandi L, NP   1,000 mg at 09/19/23 0831   aspirin  chewable tablet 81 mg  81 mg Oral Daily Angiulli, Daniel J, PA-C   81 mg at 09/19/23 0831   bethanechol  (URECHOLINE ) tablet 10 mg  10 mg Oral TID Engler, Morgan C, DO   10 mg at 09/19/23 9168   busPIRone  (BUSPAR ) tablet 15 mg  15 mg Oral BID Angiulli, Daniel J, PA-C   15 mg at 09/19/23 0831   calcium  carbonate (TUMS - dosed in mg elemental calcium ) chewable tablet 200 mg of elemental calcium   1 tablet Oral TID PRN Engler, Morgan C, DO   200 mg of elemental calcium  at 09/18/23 1607   collagenase  (SANTYL ) ointment   Topical Daily Emeline Search C, DO   Given at 09/19/23 9167   docusate sodium  (COLACE) capsule 100 mg  100 mg Oral BID Engler, Morgan C, DO   100 mg at 09/19/23 1151   enoxaparin  (LOVENOX ) injection 40 mg  40 mg Subcutaneous QHS AngiulliToribio PARAS, PA-C   40 mg at 09/18/23 2052   feeding supplement (ENSURE PLUS HIGH PROTEIN) liquid 237 mL  237 mL Oral BID BM AngiulliToribio PARAS, PA-C   237 mL at 09/17/23 1009   ferrous sulfate  tablet 325 mg  325 mg Oral BID WC Engler, Morgan C, DO   325 mg at 09/19/23 9167   folic acid  (FOLVITE ) tablet 1 mg  1 mg Oral Daily Angiulli, Daniel J, PA-C   1 mg at 09/19/23 0831   hydrocortisone  (ANUSOL -HC) 2.5 % rectal cream   Rectal TID PRN Emeline Search BROCKS, DO   Given at 09/19/23 9166    insulin  aspart (novoLOG ) injection 0-9 Units  0-9 Units Subcutaneous TID WC Angiulli, Daniel J, PA-C   1 Units at 09/19/23 1208   insulin  aspart (novoLOG ) injection 5 Units  5 Units Subcutaneous TID WC Emeline Search C, DO   5 Units at 09/19/23 1228   insulin  glargine-yfgn (SEMGLEE ) injection 12 Units  12 Units Subcutaneous Daily Angiulli, Daniel J, PA-C   12 Units at 09/19/23 0845   lamoTRIgine  (LAMICTAL ) tablet 50 mg  50 mg Oral Daily Angiulli, Daniel J, PA-C   50 mg at 09/19/23 0831   Followed by   NOREEN ON 09/24/2023] lamoTRIgine  (LAMICTAL ) tablet 100 mg  100 mg Oral Daily Angiulli, Toribio PARAS, PA-C       levothyroxine  (  SYNTHROID ) tablet 100 mcg  100 mcg Oral q morning Pegge Toribio PARAS, PA-C   100 mcg at 09/19/23 9385   multivitamin with minerals tablet 1 tablet  1 tablet Oral Daily Angiulli, Daniel J, PA-C   1 tablet at 09/19/23 9168   nitroGLYCERIN  (NITROSTAT ) SL tablet 0.4 mg  0.4 mg Sublingual Q5 Min x 3 PRN Angiulli, Daniel J, PA-C       nutrition supplement (JUVEN) (JUVEN) powder packet 1 packet  1 packet Oral BID BM Emeline Search C, DO   1 packet at 09/19/23 9163   Oral care mouth rinse  15 mL Mouth Rinse PRN Angiulli, Daniel J, PA-C       Oral care mouth rinse  15 mL Mouth Rinse PRN Angiulli, Daniel J, PA-C   15 mL at 09/12/23 9447   Oral care mouth rinse  15 mL Mouth Rinse Q4H AngiulliToribio PARAS, PA-C   15 mL at 09/19/23 1226   phenazopyridine  (PYRIDIUM ) tablet 100 mg  100 mg Oral TID WC Engler, Morgan C, DO   100 mg at 09/19/23 1225   polyethylene glycol (MIRALAX  / GLYCOLAX ) packet 17 g  17 g Oral Daily PRN Engler, Morgan C, DO       QUEtiapine  (SEROQUEL ) tablet 25 mg  25 mg Oral BID PRN Engler, Morgan C, DO   25 mg at 09/13/23 1101   revefenacin  (YUPELRI ) nebulizer solution 175 mcg  175 mcg Nebulization Daily Pegge Toribio PARAS, PA-C   175 mcg at 09/19/23 0758   sodium chloride  flush (NS) 0.9 % injection 10 mL  10 mL Intravenous Q12H Carilyn Prentice BRAVO, MD   10 mL at 09/19/23 9163    tamsulosin  (FLOMAX ) capsule 0.4 mg  0.4 mg Oral Daily Engler, Morgan C, DO   0.4 mg at 09/19/23 0831   traZODone  (DESYREL ) tablet 100 mg  100 mg Oral QHS Engler, Morgan C, DO   100 mg at 09/18/23 2052   white petrolatum  (VASELINE) gel   Topical PRN Leak, Brandi L, NP        Allergies: Allergies  Allergen Reactions   Lithium Anaphylaxis   Sulfa Antibiotics Rash   Codeine Rash    Social History: Social History   Tobacco Use   Smoking status: Every Day    Current packs/day: 0.50    Average packs/day: 0.5 packs/day for 29.6 years (14.8 ttl pk-yrs)    Types: Cigarettes    Start date: 02/18/1994   Smokeless tobacco: Never  Substance Use Topics   Alcohol use: No   Drug use: Yes    Types: Benzodiazepines, Marijuana    Family History Family History  Problem Relation Age of Onset   Diabetes Father     Review of Systems  Genitourinary:  Negative for dysuria, flank pain, frequency, hematuria and urgency.     Objective   Vital signs in last 24 hours: BP 94/68   Pulse (!) 120   Temp 98.9 F (37.2 C)   Resp 20   Ht 5' (1.524 m)   Wt 83 kg   SpO2 98%   BMI 35.74 kg/m   Physical Exam General: A&O, resting, appropriate HEENT: [place]/AT Pulmonary: Normal work of breathing Cardiovascular: no cyanosis Abdomen: Soft, NTTP, nondistended   Most Recent Labs: Lab Results  Component Value Date   WBC 8.7 09/15/2023   HGB 11.3 (L) 09/15/2023   HCT 36.2 09/15/2023   PLT 324 09/15/2023    Lab Results  Component Value Date   NA 139 09/18/2023  K 3.9 09/18/2023   CL 105 09/18/2023   CO2 26 09/18/2023   BUN 43 (H) 09/18/2023   CREATININE 1.04 (H) 09/18/2023   CALCIUM  9.7 09/18/2023   MG 2.1 08/18/2023   PHOS 4.4 08/10/2023    Lab Results  Component Value Date   INR 1.0 02/28/2016   APTT 132 (H) 07/30/2023     Urine Culture: @[id number] (laburin,org,[id number])@   IMAGING: No results found.  ------  Ole Bourdon, NP Pager: [phone]   Please  contact the urology consult pager with any further questions/concerns.

## 2023-09-19 NOTE — Plan of Care (Signed)
  Problem: RH BOWEL ELIMINATION Goal: RH STG MANAGE BOWEL WITH ASSISTANCE Description: STG Manage Bowel with minimal Assistance. Outcome: Progressing   Problem: RH BLADDER ELIMINATION Goal: RH STG MANAGE BLADDER WITH ASSISTANCE Description: STG Manage Bladder With minimal  Assistance Outcome: Progressing   Problem: RH SKIN INTEGRITY Goal: RH STG SKIN FREE OF INFECTION/BREAKDOWN Description: Manage skin free of infection/breakdown with minimal assistance Outcome: Progressing   Problem: RH SAFETY Goal: RH STG ADHERE TO SAFETY PRECAUTIONS W/ASSISTANCE/DEVICE Description: STG Adhere to Safety Precautions With minimal Assistance/Device. Outcome: Progressing   Problem: RH PAIN MANAGEMENT Goal: RH STG PAIN MANAGED AT OR BELOW PT'S PAIN GOAL Description: <4 w/ prns Outcome: Progressing

## 2023-09-19 NOTE — Progress Notes (Signed)
 PROGRESS NOTE   Subjective/Complaints:  No events overnight.  Having ongoing dysuria, is helped somewhat by the Pyridium .  In the late afternoon, developed worsening hypotension and tachycardia.  Patient seen sitting upright in bed talking to someone on the phone about financial matters.  She was not distressed, asymptomatic, denied any chest pain or shortness of breath.  Had tachycardia in the 120s and blood pressure 80s over 60s, both laying down and sitting up.  She says she is now agreeable to IV fluids  Labs pending  ROS: Limited due to cognitive/behavioral  + Intermittent CP--has not recurred + dysuria   Objective:   No results found.     No results for input(s): WBC, HGB, HCT, PLT in the last 72 hours.    Recent Labs    09/18/23 0522  NA 139  K 3.9  CL 105  CO2 26  GLUCOSE 161*  BUN 43*  CREATININE 1.04*  CALCIUM  9.7      Intake/Output Summary (Last 24 hours) at 09/19/2023 0948 Last data filed at 09/19/2023 0826 Gross per 24 hour  Intake 816 ml  Output --  Net 816 ml        Physical Exam: Vital Signs Blood pressure 99/70, pulse 100, temperature 98 F (36.7 C), temperature source Oral, resp. rate 18, height 5' (1.524 m), weight 83 kg, SpO2 98%.   Constitutional: No distress . Vital signs reviewed.  Sitting upright in bed. HEENT: NCAT, EOMI, oral membranes moist Neck: supple Cardiovascular: Tachycardic 120s, mild systolic murmur, no JVD, no peripheral edema, pulses 2+.  Respiratory/Chest: CTA Bilaterally without wheezes or rales. Normal effort    GI/Abdomen: BS +, non-tender, non-distended Ext: no clubbing, cyanosis, or edema Psych: pleasant and cooperative \ GU: Normal external appearance, no visible atrophy, inflammation, or vaginal discharge  Skin: No evidence of breakdown, no evidence of rash.  Unstageable buttocks with adherent slough; MASD surrounding--stable/improved  appearance   MSK:        + L shoulder drop-arm; palpable enlargement along L lateral glenoid. No TTP  No chest pain reproducible on palpation  Neurologic exam:  Cognition: Oriented to person, place, and time.  Limited insight and awareness. Mood: Flat affect, intermittently agitated Sensation: Equal and intact in BL UE and Les.  Reflexes: 2+ in BL UE and LEs. CN: 2-12 grossly intact.  Coordination: BL UE intention tremor--none apparent at rest Strength: Moving all 4 extremities at least 5/5.   Neuro exam unchanged from the above on reexamination 09/19/23     Assessment/Plan: 1. Functional deficits which require 3+ hours per day of interdisciplinary therapy in a comprehensive inpatient rehab setting. Physiatrist is providing close team supervision and 24 hour management of active medical problems listed below. Physiatrist and rehab team continue to assess barriers to discharge/monitor patient progress toward functional and medical goals  Care Tool:  Bathing    Body parts bathed by patient: Right arm, Left arm, Chest, Abdomen, Front perineal area, Right upper leg, Left upper leg, Face, Buttocks, Right lower leg, Left lower leg   Body parts bathed by helper: Buttocks     Bathing assist Assist Level: Supervision/Verbal cueing     Upper Body Dressing/Undressing Upper  body dressing   What is the patient wearing?: Pull over shirt    Upper body assist Assist Level: Set up assist    Lower Body Dressing/Undressing Lower body dressing      What is the patient wearing?: Underwear/pull up, Pants     Lower body assist Assist for lower body dressing: Supervision/Verbal cueing     Toileting Toileting    Toileting assist Assist for toileting: Contact Guard/Touching assist     Transfers Chair/bed transfer  Transfers assist  Chair/bed transfer activity did not occur: Safety/medical concerns  Chair/bed transfer assist level: Supervision/Verbal cueing      Locomotion Ambulation   Ambulation assist      Assist level: Minimal Assistance - Patient > 75% Assistive device: Hand held assist Max distance: 150'   Walk 10 feet activity   Assist     Assist level: Minimal Assistance - Patient > 75% Assistive device: Hand held assist   Walk 50 feet activity   Assist Walk 50 feet with 2 turns activity did not occur: Safety/medical concerns  Assist level: Minimal Assistance - Patient > 75% Assistive device: Hand held assist    Walk 150 feet activity   Assist Walk 150 feet activity did not occur: Safety/medical concerns  Assist level: Minimal Assistance - Patient > 75% Assistive device: Hand held assist    Walk 10 feet on uneven surface  activity   Assist Walk 10 feet on uneven surfaces activity did not occur: Safety/medical concerns         Wheelchair     Assist Is the patient using a wheelchair?: Yes Type of Wheelchair: Manual    Wheelchair assist level: Dependent - Patient 0% Max wheelchair distance: 150'    Wheelchair 50 feet with 2 turns activity    Assist        Assist Level: Dependent - Patient 0%   Wheelchair 150 feet activity     Assist      Assist Level: Dependent - Patient 0%   Blood pressure 99/70, pulse 100, temperature 98 F (36.7 C), temperature source Oral, resp. rate 18, height 5' (1.524 m), weight 83 kg, SpO2 98%.  Medical Problem List and Plan: 1. 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 Severe motor planning and apraxia, impulsive ex. Tried to sit mid-air. Max A overall but can power up with Min A. Will need 24/7 Min A on discharge.   7/22: Apraxia, initiation and sequencing better. Gait limited by poor therapy tolerance/behaviors. D3 diet with thin liquids, full supervision.   7-23: Reduce therapies to daily  due to poor tolerance; will discuss with family earlier discharge date given continued refusal--per patient's mother, she will participate as long as she is there, no plan to move date at this time  7/28: CGA to Min A goals with OT - progressing well. Doing better with participation this week. CGA to Min A ambulation without AD; c/b balance and endurance deficits. SLP moderate cog deficits c/b poor effort and behavior. Per mom, some wordfinding deficits but otherwise close to baseline - Mod A currently with goal Min A.   -Continue CIR therapies including PT, OT, and SLP   8-1: Tachycardic, increased hypotension this afternoon.  Getting IV fluids, EKG, lab work; DC Flomax  as below.  2.  Antithrombotics: -DVT/anticoagulation:  Pharmaceutical: Lovenox              -antiplatelet therapy: Aspirin  81 mg daily  3. Pain Management: Tylenol  as needed  4. Mood/Behavior/Sleep/bipolar disorder II - poorly controlled: BuSpar  15 mg twice daily, Lamictal  titrating to 100 mg daily             -antipsychotic agents: N/A   - 7/15: Very flat affect and poor initiation per nursing; will need full SPV for meals per nursing. Continue current medication  titration - no mania, SI, HI  - Per psych consult inpatient: Will plan to slowly titrate lamictal  since she has not been taking it for at least 1 month and to prevent risk of side effects such as SJS. Will also plan to start trazodone  at a lower dose compared to her home dose of 400 mg in setting of preventing additional sedation, altered mental status, and since she had been sleeping well while in the hospital. We will defer to outpatient psychiatry for titrating trazodone  if indicated. She can follow-up with her outpatient psychiatric provider at [facility] upon discharge.  - ?resumption on invega 3 mg; will defer to OP  7-21: Patient's mother has asked be informed if she refuses therapies, do not give patient the option and she will be more receptive. 7/23:  Refusing therapies intermittently, increasingly agitated with attempts to encourage.  Add Seroquel  25 mg twice daily as needed for agitation.   -  Invega last filled March 2025, would not restart at this time before discussing reasoning/side effects with mother. --They are unsure about this medication - Move therapies to daily for tolerance.  09-10-24: Increase trazodone  to 100 mg nightly--sleeping well  5. Neuropsych/cognition: This patient is not capable of making decisions on her own behalf.  - 7/22: Started on telesitter for OOB attempts; discussed with patient and mom possible veil bed if behaviors continue--has not had any further issues  6. Skin/Wound Care: Routine skin checks   - 7/15: Unstageable sacral PI; awaiting WC consult. Safety zone report completed.   Cleanse with Vashe [id number], not rinse, pat dry the peri-wound skin. Apply Xeroform on the wound bed daily. Cover with sacrum foam dressing changing every 3 days or PRN soiling.   7/22: Wound care, changing to Santyl  for debridement daily, then cover with moist 2 x 2 and foam dressing with dressing changes every 3 days or as needed. --Looking better.  7. Fluids/Electrolytes/Nutrition: Routine in and outs with follow-up chemistries   - 7/15: Albumin 2.3; monitor PO intakes, Ensure BID. See #15 below.   - 7-21: No AM labs; will order for late today--mild hyponatremia, 132.  Other labs are stable.  Repeat in a.m, may have been secondary to Keflex  over the weekend which is now discontinued.  7-22: Hyponatremia-resolved.  8.  CAD/Non-STEMI/acute on chronic diastolic congestive heart failure.  Cardiac catheterization found to have chronic LAD.  Continue aspirin  therapy   - daily weights --not being performed, will reorder today   - 7/29: Complaining of intermittent CP, not reproducible, c/w stable angina, resolved at time of exam. Known near-total occlusion of LAD. Reminded patient to be vocal when this occurs. Getting EKG, orthostats and  CXR for eval  7-30: EKG unchanged, chest x-ray normal, orthostats positive; ordered gentle IV fluids 500 cc today, patient refusing.  Reduce Flomax  to 0.4 mg daily.  8-1: Very low blood pressure, tachycardic.  DC Flomax .  EKG, labs pending.  Will give 1 L IV fluid at 250  cc/h due to heart failure. Filed Weights   09/17/23 0544 09/18/23 0538 09/19/23 0644  Weight: 83 kg 83 kg 83 kg     9.  Diabetes mellitus with peripheral neuropathy.  Hemoglobin A1c 4.8.  NovoLog  5 units 3 times daily, Semglee  12 units daily             -CBGs well controlled--intermittent elevations but overall well-controlled  Recent Labs    09/18/23 1652 09/18/23 2128 09/19/23 0606  GLUCAP 113* 151* 166*     10.  Leukocytosis/UTI.  Antibiotic therapy completed 11.  Hyperammonemia.  Continue Chronulac  10 mg twice daily.  Follow-up ammonia levels             -last ammonia level 20 on 7/9  12.  Hypothyroidism.  Synthroid  13.  Documented CKD stage III.  Latest creatinine 0.95.  Follow-up chemistries.  Follow-up with [facility] nephrology  - BUN/creatinine have remained stable within her normal range  14.  Chronic anemia.  Follow-up CBC. Stable 9-10.  Add iron supplement.  15.  Decreased nutritional storage.  Diet advanced to regular.  Follow-up dietary services             -intake poor/inconsistent             -engage RD for assistance   16.  COPD/tobacco use/marijuana use.  Continue inhalers as directed.  Check oxygen saturations every shift.  Urine drug screen negative.  Provide counseling   17. Urinary retention/relapsing UTI. Foley replaced 7/4. Awaiting WC consult as above for recs prior to removal--see above, DC foley trial started 7/15.    - 7/16 2x ISC overnight; 1x incontinent void; increase flomax  to 0.8 mg, start timed toiletting Q4-6 hours  - 7/17: Start bethenachol 10 mg TID for stimulation; get urinalysis  - 7-18: No improvement in urinary retention; urinalysis borderline but will opt to treat in light  of symptoms.  Start Keflex  500 mg twice daily for 7 days.  Add on urine culture--follow-up on results- > 100K Klebsiealla p await sensitivities   7-21: Pan resistant Klebsiella pneumonia; .  With same species on 7-3 urine culture, was through course of meropenem  at that time.  Pharmacy started Invanz  1 g for 3 doses yesterday for ESBL; discussing with ID pharmacy given failure of their first treatment.  Will get renal ultrasound today to evaluate for structural causes of recurrence  7-22: Improving continence, continue current regimen.  Renal ultrasound with some mild left-sided hydronephrosis, no other significant findings.  Since she has resumed spontaneous urination, will monitor for now, may consider CT abdomen and pelvis if suspicion for obstructing stone  7-23: DC bethanechol ; continue PVRs, discussed with nursing  7-24: Started retaining again overnight, resume bethanechol  10 mg 3 times daily.  Will get CT renal study today to evaluate.  7-25: Symptoms resolved with bethanechol , CT study with some signs of cystitis but with recent UTI this could be residual.  No stones, no other features concerning for bladder obstruction.  Would continue through today and then may try to wean to 5 mg 3 times daily over the weekend, but would not discontinue. - Will need outpatient urology follow-up  7/26-27--incontinent. No retention reported but no scans done either.  -will reduce urecholine  to 5mg  tid 7-28: Voiding but incontinent with current regimen; resume PVRs--did remind nursing that these needs to be done to monitor medication changes.  Urinalysis today improved.  7/29: Retained again today; increase bethenachol to 10 mg TID.   7-30: No further retention  on increased dose.  Reduce Flomax  to 0.4 mg due to orthostasis.  7-31: Patient reporting dysuria again, mom reporting foul urine.  Is continuing to void continent with reduced Flomax .  Start Pyridium  3 times daily, will get repeat urinalysis--looks  positive, presumably reinfection vs. colonization with Klebsiella again.  Given no other signs of infection aside from ongoing retention, will hold off on repeat treatment at this time, get urology evaluation in a.m.  8/1: DC Flomax  due to orthostasis, resume PVRs.  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  Recommendations by both no further antibiotics, likely colonized, continue bethanechol  10 mg 3 times daily and get outpatient urology assessment.  Likely secondary to diabetes, peripheral neuropathy, neurogenic bladder and recent hospitalization--has likely been retaining for some time.  --Needs PVRs over this weekend  18. Constipation. Small, hard BM this AM. Add Miralax  daily, sennakot s 1 tab BID.  On Chronulac  10 mg twice daily.   - BM 7/16, large, with some rectal bleeding--add cream  -7-21: Multiple large, incontinent, liquid bowel movements overnight.  Likely secondary to antibiotics, DC daily Senokot, Chronulac --continue miralax   7-23: Continuing loose bowel movements, likely complicated by bethanechol ; move MiraLAX  as needed  7/27--moved bowels, small   19. L drop arm. ?RTC. Patient endorses fall 2 months ago with no workup. Will xray.   - 7/16: Xray w/o acute fractuire or dislocation  - 7/29: Palpable muscle retraction at glenoid, nonpainful ROM deficits c/w RTC; will need OP ortho eval once medically stable  20. L calcified carotid plaque. CTA vs US  evaluation as OP.   21. Hx CVA with chronic R HP 2016 noted Left parietal on FLAIR and DWI but had a large DDX including CIN, PRES, was at [facility] center 06/14/14-06/24/14, given persistent of MRI finding, CVA is the cause  for R HP   7-21: Patient denies right hip pain on today's exam  22. AKI. Likely d/t dehydration, UTI.  - Give 1 L IVF normal saline at 100 cc/hr today. Repeat BMP in AM.  - Encourage PO  fluids - 7/23: Cr improved; can stop IV fluids.  7/27 eating and drinking fairly well, not sure how accurate fluids are being tracked  7-28: Mild creatinine increase, recurrent.  Not yet at AKI level.  Will monitor with routine labs   - 7/31; BUN doubled today, creatinine staying stable.  Patient refusing IV fluid, really encouraging p.o. fluid intakes with nursing today.  Repeat in AM. LOS: 18 days A FACE TO FACE EVALUATION WAS PERFORMED  Sarah Davis 09/19/2023, 9:48 AM

## 2023-09-19 NOTE — Progress Notes (Signed)
 Physical Therapy Session Note  Patient Details  Name: Sarah Davis MRN: [id number] Date of Birth: 12-03-1960  Today's Date: 09/19/2023 PT Individual Time: 0930-1014 PT Individual Time Calculation (min): 44 min   Short Term Goals: Week 3:  PT Short Term Goal 1 (Week 3): STGs = LTGs  Skilled Therapeutic Interventions/Progress Updates:      Pt seen for family education and training. Patient's daughter, August, and her mother were both present throughout session. Patient seen in bed on arrival, denies any pain.   Reviewed PT POC, PT goals, follow up PT (OPPT), anticipated DME rec's, and recommended level of care (CGA/supervision) for home. Reviewed general home safety recommendations, fall prevention strategies, and general recovery.   Patient completes bed mobility at supervision level. Sit<>stands during session completed with supervision assist with cues for safety and setup.   She ambulated with SBA/CGA throughout CIR hallways, intermittent cueing for relaxing hands from high guard to a more natural arm swing. Added 3# dowel rods to help improve relaxation and extension at her elbows, patient able to hold them for about ~24ft before throwing them on the ground because it was too difficult.   Reviewed stair training which was done with suprevision and 6 steps, 2 hand rails. She was able to navigate up/down x12 without LOB or tripping, reviewed guarding and positioning from family for stair climbing.   Finished session completing car transfer with car height replicating her family's mid-size SUV - completed with CGA with safety cues for sitting her bottom in before getting her legs in. Repeated this twice to ensure understanding of carryover.   Patient returned to her room from ortho gym, ambulating >285ft with CGA. Noted fatigue and needing encouragement to ambulate the full distance. Patient ended her session seated EOB with family present. All questions addressed.   Therapy  Documentation Precautions:  Restrictions Weight Bearing Restrictions Per Provider Order: No General:     Therapy/Group: Individual Therapy  Sherlean SHAUNNA Perks 09/19/2023, 7:51 AM

## 2023-09-19 NOTE — Progress Notes (Signed)
 Patient's demeanor  remains the same as this morning, patient complains of slight abdomen pain and pain during urination, provider notified/ charged notified on new Yellow MEWS Status change   09/19/23 1231  Assess: MEWS Score  Temp 98.2 F (36.8 C)  BP 90/66  MAP (mmHg) 75  Pulse Rate (!) 116  Resp 18  SpO2 95 %  O2 Device Room Air  Assess: MEWS Score  MEWS Temp 0  MEWS Systolic 1  MEWS Pulse 2  MEWS RR 0  MEWS LOC 0  MEWS Score 3  MEWS Score Color Yellow  Assess: if the MEWS score is Yellow or Red  Were vital signs accurate and taken at a resting state? Yes  Does the patient meet 2 or more of the SIRS criteria? No  MEWS guidelines implemented  Yes, yellow  Treat  MEWS Interventions Considered administering scheduled or prn medications/treatments as ordered  Take Vital Signs  Increase Vital Sign Frequency  Yellow: Q2hr x1, continue Q4hrs until patient remains green for 12hrs  Escalate  MEWS: Escalate Yellow: Discuss with charge nurse and consider notifying provider and/or RRT  Notify: Charge Nurse/RN  Name of Charge Nurse/RN Notified Acupuncturist  Provider Notification  Provider Name/Title Rolan Haggard PA  Date Provider Notified 09/19/23  Time Provider Notified 1242  Method of Notification Call  Notification Reason Change in status  Provider response En route  Date of Provider Response 09/19/23  Time of Provider Response 1243  Assess: SIRS CRITERIA  SIRS Temperature  0  SIRS Respirations  0  SIRS Pulse 1  SIRS WBC 0  SIRS Score Sum  1

## 2023-09-20 LAB — GLUCOSE, CAPILLARY
Glucose-Capillary: 132 mg/dL — ABNORMAL HIGH (ref 70–99)
Glucose-Capillary: 229 mg/dL — ABNORMAL HIGH (ref 70–99)
Glucose-Capillary: 117 mg/dL — ABNORMAL HIGH (ref 70–99)
Glucose-Capillary: 146 mg/dL — ABNORMAL HIGH (ref 70–99)

## 2023-09-20 LAB — BASIC METABOLIC PANEL WITH GFR
Anion gap: 10 (ref 5–15)
BUN: 29 mg/dL — ABNORMAL HIGH (ref 8–23)
CO2: 23 mmol/L (ref 22–32)
Calcium: 9 mg/dL (ref 8.9–10.3)
Chloride: 104 mmol/L (ref 98–111)
Creatinine, Ser: 1.11 mg/dL — ABNORMAL HIGH (ref 0.44–1.00)
GFR, Estimated: 56 mL/min — ABNORMAL LOW (ref >60)
Glucose, Bld: 234 mg/dL — ABNORMAL HIGH (ref 70–99)
Potassium: 3.7 mmol/L (ref 3.5–5.1)
Sodium: 137 mmol/L (ref 135–145)

## 2023-09-20 LAB — CBC WITH DIFFERENTIAL/PLATELET
Abs Immature Granulocytes: 0.06 K/uL (ref 0.00–0.07)
Basophils Absolute: 0.1 K/uL (ref 0.0–0.1)
Basophils Relative: 1 %
Eosinophils Absolute: 0.2 K/uL (ref 0.0–0.5)
Eosinophils Relative: 2 %
HCT: 33.4 % — ABNORMAL LOW (ref 36.0–46.0)
Hemoglobin: 10.5 g/dL — ABNORMAL LOW (ref 12.0–15.0)
Immature Granulocytes: 1 %
Lymphocytes Relative: 28 %
Lymphs Abs: 2.5 K/uL (ref 0.7–4.0)
MCH: 28.4 pg (ref 26.0–34.0)
MCHC: 31.4 g/dL (ref 30.0–36.0)
MCV: 90.3 fL (ref 80.0–100.0)
Monocytes Absolute: 0.5 K/uL (ref 0.1–1.0)
Monocytes Relative: 6 %
Neutro Abs: 5.7 K/uL (ref 1.7–7.7)
Neutrophils Relative %: 62 %
Platelets: 260 K/uL (ref 150–400)
RBC: 3.7 MIL/uL — ABNORMAL LOW (ref 3.87–5.11)
RDW: 17 % — ABNORMAL HIGH (ref 11.5–15.5)
WBC: 9.1 K/uL (ref 4.0–10.5)
nRBC: 0 % (ref 0.0–0.2)

## 2023-09-20 MED ORDER — PHENAZOPYRIDINE HCL 100 MG PO TABS
100.0000 mg | ORAL_TABLET | Freq: Three times a day (TID) | ORAL | Status: DC | PRN
  Administered 2023-09-21: 100 mg via ORAL
  Filled 2023-09-20: qty 1

## 2023-09-20 NOTE — Plan of Care (Signed)
  Problem: Consults Goal: RH GENERAL PATIENT EDUCATION Description: See Patient Education module for education specifics. Outcome: Progressing   Problem: RH BOWEL ELIMINATION Goal: RH STG MANAGE BOWEL WITH ASSISTANCE Description: STG Manage Bowel with minimal Assistance. Outcome: Progressing   Problem: RH BLADDER ELIMINATION Goal: RH STG MANAGE BLADDER WITH ASSISTANCE Description: STG Manage Bladder With minimal Assistance Outcome: Progressing   Problem: RH SKIN INTEGRITY Goal: RH STG SKIN FREE OF INFECTION/BREAKDOWN Description: Manage skin free of infection/breakdown with minimal assistance Outcome: Progressing   Problem: RH SAFETY Goal: RH STG ADHERE TO SAFETY PRECAUTIONS W/ASSISTANCE/DEVICE Description: STG Adhere to Safety Precautions With minimal Assistance/Device. Outcome: Progressing   Problem: RH PAIN MANAGEMENT Goal: RH STG PAIN MANAGED AT OR BELOW PT'S PAIN GOAL Description: < 4 w/prns Outcome: Progressing   Problem: RH KNOWLEDGE DEFICIT GENERAL Goal: RH STG INCREASE KNOWLEDGE OF SELF CARE AFTER HOSPITALIZATION Description: Manage increase knowledge of self care after hospitalization with minimal assistance from family using educational materials provided Outcome: Progressing

## 2023-09-20 NOTE — Progress Notes (Signed)
 PROGRESS NOTE   Subjective/Complaints:  Pt reports feels fine' slept OK.  Doesn't feel constipated, but has no memory of LBM.  Still receiving Ivfs- Cr down to 1.11- from 1.52 and BUN 29 down from 35.   Note from nursing that pt kept putting off labs- and finally got done ~ 1200. Pt refusing  ROS: Limited by cognition/behavior + Intermittent CP-not today so far + dysuria   Objective:   No results found.     Recent Labs    09/19/23 2050 09/20/23 1112  WBC 10.8* 9.1  HGB 10.8* 10.5*  HCT 34.2* 33.4*  PLT 265 260      Recent Labs    09/19/23 1835 09/20/23 1112  NA 137 137  K 4.2 3.7  CL 101 104  CO2 23 23  GLUCOSE 162* 234*  BUN 35* 29*  CREATININE 1.52* 1.11*  CALCIUM  9.8 9.0      Intake/Output Summary (Last 24 hours) at 09/20/2023 1711 Last data filed at 09/20/2023 1513 Gross per 24 hour  Intake 474 ml  Output --  Net 474 ml        Physical Exam: Vital Signs Blood pressure 112/65, pulse 73, temperature 98.6 F (37 C), resp. rate 18, height 5' (1.524 m), weight 83 kg, SpO2 93%.    General: awake, somewhat alert, but appears confused; NAD HENT: conjugate gaze; oropharynx dry CV: regular rate; no JVD Pulmonary: CTA B/L; no W/R/R- good air movement- no fluid overload GI: soft, NT, ND, (+)BS- hypoactive Psychiatric: appropriate Neurological: Ox1-2 Skin- has IV no fluids running GU: Normal external appearance, no visible atrophy, inflammation, or vaginal discharge  Skin: No evidence of breakdown, no evidence of rash.  Unstageable buttocks with adherent slough; MASD surrounding--stable/improved appearance   MSK:        + L shoulder drop-arm; palpable enlargement along L lateral glenoid. No TTP  No chest pain reproducible on palpation  Neurologic exam:  Cognition: Oriented to person, place, and time.  Limited insight and awareness. Mood: Flat affect, intermittently  agitated Sensation: Equal and intact in BL UE and Les.  Reflexes: 2+ in BL UE and LEs. CN: 2-12 grossly intact.  Coordination: BL UE intention tremor--none apparent at rest Strength: Moving all 4 extremities at least 5/5.   Neuro exam unchanged from the above on reexamination 09/20/23     Assessment/Plan: 1. Functional deficits which require 3+ hours per day of interdisciplinary therapy in a comprehensive inpatient rehab setting. Physiatrist is providing close team supervision and 24 hour management of active medical problems listed below. Physiatrist and rehab team continue to assess barriers to discharge/monitor patient progress toward functional and medical goals  Care Tool:  Bathing    Body parts bathed by patient: Right arm, Left arm, Chest, Abdomen, Front perineal area, Right upper leg, Left upper leg, Face, Buttocks, Right lower leg, Left lower leg   Body parts bathed by helper: Buttocks     Bathing assist Assist Level: Supervision/Verbal cueing     Upper Body Dressing/Undressing Upper body dressing   What is the patient wearing?: Pull over shirt    Upper body assist Assist Level: Set up assist    Lower Body Dressing/Undressing  Lower body dressing      What is the patient wearing?: Underwear/pull up, Pants     Lower body assist Assist for lower body dressing: Supervision/Verbal cueing     Toileting Toileting    Toileting assist Assist for toileting: Contact Guard/Touching assist     Transfers Chair/bed transfer  Transfers assist  Chair/bed transfer activity did not occur: Safety/medical concerns  Chair/bed transfer assist level: Supervision/Verbal cueing     Locomotion Ambulation   Ambulation assist      Assist level: Minimal Assistance - Patient > 75% Assistive device: Hand held assist Max distance: 150'   Walk 10 feet activity   Assist     Assist level: Minimal Assistance - Patient > 75% Assistive device: Hand held assist   Walk  50 feet activity   Assist Walk 50 feet with 2 turns activity did not occur: Safety/medical concerns  Assist level: Minimal Assistance - Patient > 75% Assistive device: Hand held assist    Walk 150 feet activity   Assist Walk 150 feet activity did not occur: Safety/medical concerns  Assist level: Minimal Assistance - Patient > 75% Assistive device: Hand held assist    Walk 10 feet on uneven surface  activity   Assist Walk 10 feet on uneven surfaces activity did not occur: Safety/medical concerns         Wheelchair     Assist Is the patient using a wheelchair?: Yes Type of Wheelchair: Manual    Wheelchair assist level: Dependent - Patient 0% Max wheelchair distance: 150'    Wheelchair 50 feet with 2 turns activity    Assist        Assist Level: Dependent - Patient 0%   Wheelchair 150 feet activity     Assist      Assist Level: Dependent - Patient 0%   Blood pressure 112/65, pulse 73, temperature 98.6 F (37 C), resp. rate 18, height 5' (1.524 m), weight 83 kg, SpO2 93%.  Medical Problem List and Plan: 1. 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 Severe motor planning and apraxia, impulsive ex. Tried to sit mid-air. Max A overall but can power up with Min A. Will need 24/7 Min A on discharge.   7/22: Apraxia, initiation and sequencing better. Gait limited by poor therapy tolerance/behaviors. D3 diet with thin liquids, full supervision.   7-23: Reduce therapies to daily due to poor tolerance; will discuss with family earlier discharge date given continued refusal--per patient's mother, she will participate as long as she is there, no plan to move date at this time  7/28: CGA to Min A goals with OT - progressing well. Doing better with participation this week. CGA to Min A ambulation without  AD; c/b balance and endurance deficits. SLP moderate cog deficits c/b poor effort and behavior. Per mom, some wordfinding deficits but otherwise close to baseline - Mod A currently with goal Min A.   -Con't CIR PT, OT and SLP 8-1: Tachycardic, increased hypotension this afternoon.  Getting IV fluids, EKG, lab work; DC Flomax  as below.  8/2- HR running 57-90- much better  2.  Antithrombotics: -DVT/anticoagulation:  Pharmaceutical: Lovenox              -antiplatelet therapy: Aspirin  81 mg daily  3. Pain Management: Tylenol   as needed  4. Mood/Behavior/Sleep/bipolar disorder II - poorly controlled: BuSpar  15 mg twice daily, Lamictal  titrating to 100 mg daily             -antipsychotic agents: N/A   - 7/15: Very flat affect and poor initiation per nursing; will need full SPV for meals per nursing. Continue current medication  titration - no mania, SI, HI  - Per psych consult inpatient: Will plan to slowly titrate lamictal  since she has not been taking it for at least 1 month and to prevent risk of side effects such as SJS. Will also plan to start trazodone  at a lower dose compared to her home dose of 400 mg in setting of preventing additional sedation, altered mental status, and since she had been sleeping well while in the hospital. We will defer to outpatient psychiatry for titrating trazodone  if indicated. She can follow-up with her outpatient psychiatric provider at [facility] upon discharge.  - ?resumption on invega 3 mg; will defer to OP  7-21: Patient's mother has asked be informed if she refuses therapies, do not give patient the option and she will be more receptive. 7/23: Refusing therapies intermittently, increasingly agitated with attempts to encourage.  Add Seroquel  25 mg twice daily as needed for agitation.   -  Invega last filled March 2025, would not restart at this time before discussing reasoning/side effects with mother. --They are unsure about this medication - Move therapies  to daily for tolerance.  09-10-24: Increase trazodone  to 100 mg nightly--sleeping well  8/2- pt reports slept well 5. Neuropsych/cognition: This patient is not capable of making decisions on her own behalf.  - 7/22: Started on telesitter for OOB attempts; discussed with patient and mom possible veil bed if behaviors continue--has not had any further issues  6. Skin/Wound Care: Routine skin checks   - 7/15: Unstageable sacral PI; awaiting WC consult. Safety zone report completed.   Cleanse with Vashe [id number], not rinse, pat dry the peri-wound skin. Apply Xeroform on the wound bed daily. Cover with sacrum foam dressing changing every 3 days or PRN soiling.   7/22: Wound care, changing to Santyl  for debridement daily, then cover with moist 2 x 2 and foam dressing with dressing changes every 3 days or as needed. --Looking better.  7. Fluids/Electrolytes/Nutrition: Routine in and outs with follow-up chemistries   - 7/15: Albumin 2.3; monitor PO intakes, Ensure BID. See #15 below.   - 7-21: No AM labs; will order for late today--mild hyponatremia, 132.  Other labs are stable.  Repeat in a.m, may have been secondary to Keflex  over the weekend which is now discontinued.  7-22: Hyponatremia-resolved.  8/2- Na 137 8.  CAD/Non-STEMI/acute on chronic diastolic congestive heart failure.  Cardiac catheterization found to have chronic LAD.  Continue aspirin  therapy   - daily weights --not being performed, will reorder today   - 7/29: Complaining of intermittent CP, not reproducible, c/w stable angina, resolved at time of exam. Known near-total occlusion of LAD. Reminded patient to be vocal when this occurs. Getting EKG, orthostats and CXR for eval  7-30: EKG unchanged, chest x-ray normal, orthostats positive; ordered gentle IV fluids 500 cc today, patient refusing.  Reduce Flomax  to 0.4 mg daily.  8-1: Very low blood pressure, tachycardic.  DC Flomax .  EKG, labs pending.  Will give 1 L IV fluid at 250 cc/h due  to heart failure.  8/2- NO weight this AM- pt refused per chart Filed Weights   09/17/23 0544 09/18/23  9461 09/19/23 0644  Weight: 83 kg 83 kg 83 kg     9.  Diabetes mellitus with peripheral neuropathy.  Hemoglobin A1c 4.8.  NovoLog  5 units 3 times daily, Semglee  12 units daily             -CBGs well controlled--intermittent elevations but overall well-controlled  8/2- Cbgs overall good, but intermittent bumps to 229 today0 con't to monitor trend Recent Labs    09/20/23 0647 09/20/23 1122 09/20/23 1635  GLUCAP 132* 229* 117*     10.  Leukocytosis/UTI.  Antibiotic therapy completed 11.  Hyperammonemia.  Continue Chronulac  10 mg twice daily.  Follow-up ammonia levels             -last ammonia level 20 on 7/9  12.  Hypothyroidism.  Synthroid  13.  Documented CKD stage III.  Latest creatinine 0.95.  Follow-up chemistries.  Follow-up with [facility] nephrology  - BUN/creatinine have remained stable within her normal range  14.  Chronic anemia.  Follow-up CBC. Stable 9-10.  Add iron supplement.  15.  Decreased nutritional storage.  Diet advanced to regular.  Follow-up dietary services             -intake poor/inconsistent             -engage RD for assistance   16.  COPD/tobacco use/marijuana use.  Continue inhalers as directed.  Check oxygen saturations every shift.  Urine drug screen negative.  Provide counseling   17. Urinary retention/relapsing UTI. Foley replaced 7/4. Awaiting WC consult as above for recs prior to removal--see above, DC foley trial started 7/15.    - 7/16 2x ISC overnight; 1x incontinent void; increase flomax  to 0.8 mg, start timed toiletting Q4-6 hours  - 7/17: Start bethenachol 10 mg TID for stimulation; get urinalysis  - 7-18: No improvement in urinary retention; urinalysis borderline but will opt to treat in light of symptoms.  Start Keflex  500 mg twice daily for 7 days.  Add on urine culture--follow-up on results- > 100K Klebsiealla p await sensitivities    7-21: Pan resistant Klebsiella pneumonia; .  With same species on 7-3 urine culture, was through course of meropenem  at that time.  Pharmacy started Invanz  1 g for 3 doses yesterday for ESBL; discussing with ID pharmacy given failure of their first treatment.  Will get renal ultrasound today to evaluate for structural causes of recurrence  7-22: Improving continence, continue current regimen.  Renal ultrasound with some mild left-sided hydronephrosis, no other significant findings.  Since she has resumed spontaneous urination, will monitor for now, may consider CT abdomen and pelvis if suspicion for obstructing stone  7-23: DC bethanechol ; continue PVRs, discussed with nursing  7-24: Started retaining again overnight, resume bethanechol  10 mg 3 times daily.  Will get CT renal study today to evaluate.  7-25: Symptoms resolved with bethanechol , CT study with some signs of cystitis but with recent UTI this could be residual.  No stones, no other features concerning for bladder obstruction.  Would continue through today and then may try to wean to 5 mg 3 times daily over the weekend, but would not discontinue. - Will need outpatient urology follow-up  7/26-27--incontinent. No retention reported but no scans done either.  -will reduce urecholine  to 5mg  tid 7-28: Voiding but incontinent with current regimen; resume PVRs--did remind nursing that these needs to be done to monitor medication changes.  Urinalysis today improved.  7/29: Retained again today; increase bethenachol to 10 mg TID.   7-30: No  further retention on increased dose.  Reduce Flomax  to 0.4 mg due to orthostasis.  7-31: Patient reporting dysuria again, mom reporting foul urine.  Is continuing to void continent with reduced Flomax .  Start Pyridium  3 times daily, will get repeat urinalysis--looks positive, presumably reinfection vs. colonization with Klebsiella again.  Given no other signs of infection aside from ongoing retention, will hold  off on repeat treatment at this time, get urology evaluation in a.m.  8/1: DC Flomax  due to orthostasis, resume PVRs.  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  Recommendations by both no further antibiotics, likely colonized, continue bethanechol  10 mg 3 times daily and get outpatient urology assessment.  Likely secondary to diabetes, peripheral neuropathy, neurogenic bladder and recent hospitalization--has likely been retaining for some time.  --Needs PVRs over this weekend 8/2- Ordered Bladder scans so can schedule- q6 hours for 3 days- don't see PVR order? 18. Constipation. Small, hard BM this AM. Add Miralax  daily, sennakot s 1 tab BID.  On Chronulac  10 mg twice daily.   - BM 7/16, large, with some rectal bleeding--add cream  -7-21: Multiple large, incontinent, liquid bowel movements overnight.  Likely secondary to antibiotics, DC daily Senokot, Chronulac --continue miralax   7-23: Continuing loose bowel movements, likely complicated by bethanechol ; move MiraLAX  as needed  7/27--moved bowels, small   8/2- LBM 7/31 per chart review- pt reports doesn't remember- if no BM by tomorrow, will need to intervene 19. L drop arm. ?RTC. Patient endorses fall 2 months ago with no workup. Will xray.   - 7/16: Xray w/o acute fractuire or dislocation  - 7/29: Palpable muscle retraction at glenoid, nonpainful ROM deficits c/w RTC; will need OP ortho eval once medically stable  20. L calcified carotid plaque. CTA vs US  evaluation as OP.   21. Hx CVA with chronic R HP 2016 noted Left parietal on FLAIR and DWI but had a large DDX including CIN, PRES, was at [facility] center 06/14/14-06/24/14, given persistent of MRI finding, CVA is the cause  for R HP   7-21: Patient denies right hip pain on today's exam  22. AKI. Likely d/t dehydration, UTI.  - Give 1 L IVF normal saline at 100 cc/hr  today. Repeat BMP in AM.  - Encourage PO fluids - 7/23: Cr improved; can stop IV fluids.  7/27 eating and drinking fairly well, not sure how accurate fluids are being tracked  7-28: Mild creatinine increase, recurrent.  Not yet at AKI level.  Will monitor with routine labs   - 7/31; BUN doubled today, creatinine staying stable.  Patient refusing IV fluid, really encouraging p.o. fluid intakes with nursing today.  Repeat in AM.  8/2- Cr down to 1.11- and BUN down to 29 from 35- will recheck Monday unless symptomatic.   I spent a total of 51   minutes on total care today- >50% coordination of care- due to  D/w pt, review of chart at length, and review of labs, vitals and d/w nursing multiple times.    LOS: 19 days A FACE TO FACE EVALUATION WAS PERFORMED  Knolan Simien 09/20/2023, 5:11 PM

## 2023-09-20 NOTE — Progress Notes (Addendum)
 Pt has refused several attempts for lab draws, medication and vitals this morning. When assessed, pt states,  I'll do it later. Come back later. Pt education provided, pt continues to refuse care at this time.   [id number] Pt allowed RN to administer medication and obtain vitals. WNL.

## 2023-09-21 LAB — GLUCOSE, CAPILLARY
Glucose-Capillary: 128 mg/dL — ABNORMAL HIGH (ref 70–99)
Glucose-Capillary: 169 mg/dL — ABNORMAL HIGH (ref 70–99)
Glucose-Capillary: 95 mg/dL (ref 70–99)

## 2023-09-21 MED ORDER — PHENAZOPYRIDINE HCL 100 MG PO TABS
100.0000 mg | ORAL_TABLET | Freq: Three times a day (TID) | ORAL | Status: AC
  Administered 2023-09-21 – 2023-09-23 (×6): 100 mg via ORAL
  Filled 2023-09-21 (×6): qty 1

## 2023-09-21 MED ORDER — SORBITOL 70 % SOLN
30.0000 mL | Freq: Once | Status: AC
  Administered 2023-09-21: 30 mL via ORAL
  Filled 2023-09-21: qty 30

## 2023-09-21 NOTE — Plan of Care (Signed)
  Problem: Consults Goal: RH GENERAL PATIENT EDUCATION Description: See Patient Education module for education specifics. Outcome: Progressing   Problem: RH BOWEL ELIMINATION Goal: RH STG MANAGE BOWEL WITH ASSISTANCE Description: STG Manage Bowel with minimal Assistance. Outcome: Progressing   Problem: RH BLADDER ELIMINATION Goal: RH STG MANAGE BLADDER WITH ASSISTANCE Description: STG Manage Bladder With minimal Assistance Outcome: Progressing   Problem: RH SKIN INTEGRITY Goal: RH STG SKIN FREE OF INFECTION/BREAKDOWN Description: Manage skin free of infection/breakdown with minimal assistance Outcome: Progressing   Problem: RH SAFETY Goal: RH STG ADHERE TO SAFETY PRECAUTIONS W/ASSISTANCE/DEVICE Description: STG Adhere to Safety Precautions With minimal Assistance/Device. Outcome: Progressing   Problem: RH PAIN MANAGEMENT Goal: RH STG PAIN MANAGED AT OR BELOW PT'S PAIN GOAL Description: < 4 w/prns Outcome: Progressing   Problem: RH KNOWLEDGE DEFICIT GENERAL Goal: RH STG INCREASE KNOWLEDGE OF SELF CARE AFTER HOSPITALIZATION Description: Manage increase knowledge of self care after hospitalization with minimal assistance from family using educational materials provided Outcome: Progressing

## 2023-09-21 NOTE — Progress Notes (Signed)
 PROGRESS NOTE   Subjective/Complaints:  Pt reports wants me to leave her alone- so she can sleep.  However when found out who I was, she said she's still burning when she pees.  No BM lately- willing to take meds for constipation.    ROS: limited by cognition/behavior + Intermittent CP-not today so far + dysuria still- Pyridium  was prn- will change to scheduled   Objective:   No results found.     Recent Labs    09/19/23 2050 09/20/23 1112  WBC 10.8* 9.1  HGB 10.8* 10.5*  HCT 34.2* 33.4*  PLT 265 260      Recent Labs    09/19/23 1835 09/20/23 1112  NA 137 137  K 4.2 3.7  CL 101 104  CO2 23 23  GLUCOSE 162* 234*  BUN 35* 29*  CREATININE 1.52* 1.11*  CALCIUM  9.8 9.0      Intake/Output Summary (Last 24 hours) at 09/21/2023 0952 Last data filed at 09/20/2023 2300 Gross per 24 hour  Intake 358 ml  Output --  Net 358 ml        Physical Exam: Vital Signs Blood pressure 104/66, pulse 97, temperature 98.5 F (36.9 C), temperature source Oral, resp. rate 18, height 5' (1.524 m), weight 83 kg, SpO2 98%.     General: woke her up- curled up in bed; initially asleep;  NAD HENT: conjugate gaze; oropharynx dry CV: regular rate and rhythm- however rate in 90's; no JVD Pulmonary: CTA B/L; no W/R/R- good air movement GI: soft, NT, ND, (+)BS- hypoactive Psychiatric:frustrated I'm waking her up Neurological: Ox1-2 still - somewhat confused, but able to answer more questions today  Skin- has IV no fluids running GU: Normal external appearance, no visible atrophy, inflammation, or vaginal discharge  Skin: No evidence of breakdown, no evidence of rash.  Unstageable buttocks with adherent slough; MASD surrounding--stable/improved appearance   MSK:        + L shoulder drop-arm; palpable enlargement along L lateral glenoid. No TTP  No chest pain reproducible on palpation  Neurologic exam:  Cognition:  Oriented to person, place, and time.  Limited insight and awareness. Mood: Flat affect, intermittently agitated Sensation: Equal and intact in BL UE and Les.  Reflexes: 2+ in BL UE and LEs. CN: 2-12 grossly intact.  Coordination: BL UE intention tremor--none apparent at rest Strength: Moving all 4 extremities at least 5/5.   Neuro exam unchanged from the above on reexamination 09/21/23     Assessment/Plan: 1. Functional deficits which require 3+ hours per day of interdisciplinary therapy in a comprehensive inpatient rehab setting. Physiatrist is providing close team supervision and 24 hour management of active medical problems listed below. Physiatrist and rehab team continue to assess barriers to discharge/monitor patient progress toward functional and medical goals  Care Tool:  Bathing    Body parts bathed by patient: Right arm, Left arm, Chest, Abdomen, Front perineal area, Right upper leg, Left upper leg, Face, Buttocks, Right lower leg, Left lower leg   Body parts bathed by helper: Buttocks     Bathing assist Assist Level: Supervision/Verbal cueing     Upper Body Dressing/Undressing Upper body dressing   What is  the patient wearing?: Pull over shirt    Upper body assist Assist Level: Set up assist    Lower Body Dressing/Undressing Lower body dressing      What is the patient wearing?: Underwear/pull up, Pants     Lower body assist Assist for lower body dressing: Supervision/Verbal cueing     Toileting Toileting    Toileting assist Assist for toileting: Contact Guard/Touching assist     Transfers Chair/bed transfer  Transfers assist  Chair/bed transfer activity did not occur: Safety/medical concerns  Chair/bed transfer assist level: Supervision/Verbal cueing     Locomotion Ambulation   Ambulation assist      Assist level: Minimal Assistance - Patient > 75% Assistive device: Hand held assist Max distance: 150'   Walk 10 feet  activity   Assist     Assist level: Minimal Assistance - Patient > 75% Assistive device: Hand held assist   Walk 50 feet activity   Assist Walk 50 feet with 2 turns activity did not occur: Safety/medical concerns  Assist level: Minimal Assistance - Patient > 75% Assistive device: Hand held assist    Walk 150 feet activity   Assist Walk 150 feet activity did not occur: Safety/medical concerns  Assist level: Minimal Assistance - Patient > 75% Assistive device: Hand held assist    Walk 10 feet on uneven surface  activity   Assist Walk 10 feet on uneven surfaces activity did not occur: Safety/medical concerns         Wheelchair     Assist Is the patient using a wheelchair?: Yes Type of Wheelchair: Manual    Wheelchair assist level: Dependent - Patient 0% Max wheelchair distance: 150'    Wheelchair 50 feet with 2 turns activity    Assist        Assist Level: Dependent - Patient 0%   Wheelchair 150 feet activity     Assist      Assist Level: Dependent - Patient 0%   Blood pressure 104/66, pulse 97, temperature 98.5 F (36.9 C), temperature source Oral, resp. rate 18, height 5' (1.524 m), weight 83 kg, SpO2 98%.  Medical Problem List and Plan: 1. 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 Severe motor planning and apraxia, impulsive ex. Tried to sit mid-air. Max A overall but can power up with Min A. Will need 24/7 Min A on discharge.   7/22: Apraxia, initiation and sequencing better. Gait limited by poor therapy tolerance/behaviors. D3 diet with thin liquids, full supervision.   7-23: Reduce therapies to daily due to poor tolerance; will discuss with family earlier discharge date given continued refusal--per patient's mother, she will participate as long as she is there, no plan to move  date at this time  7/28: CGA to Min A goals with OT - progressing well. Doing better with participation this week. CGA to Min A ambulation without AD; c/b balance and endurance deficits. SLP moderate cog deficits c/b poor effort and behavior. Per mom, some wordfinding deficits but otherwise close to baseline - Mod A currently with goal Min A.  8-1: Tachycardic, increased hypotension this afternoon.  Getting IV fluids, EKG, lab work; DC Flomax  as below.  Con't CIR PT, OT and SLP  Hate rate 90s this AM   2.  Antithrombotics: -DVT/anticoagulation:  Pharmaceutical: Lovenox              -antiplatelet therapy: Aspirin  81 mg daily  3. Pain Management: Tylenol  as needed  4. Mood/Behavior/Sleep/bipolar disorder II - poorly controlled: BuSpar  15 mg twice daily, Lamictal  titrating to 100 mg daily             -antipsychotic agents: N/A   - 7/15: Very flat affect and poor initiation per nursing; will need full SPV for meals per nursing. Continue current medication  titration - no mania, SI, HI  - Per psych consult inpatient: Will plan to slowly titrate lamictal  since she has not been taking it for at least 1 month and to prevent risk of side effects such as SJS. Will also plan to start trazodone  at a lower dose compared to her home dose of 400 mg in setting of preventing additional sedation, altered mental status, and since she had been sleeping well while in the hospital. We will defer to outpatient psychiatry for titrating trazodone  if indicated. She can follow-up with her outpatient psychiatric provider at [facility] upon discharge.  - ?resumption on invega 3 mg; will defer to OP  7-21: Patient's mother has asked be informed if she refuses therapies, do not give patient the option and she will be more receptive. 7/23: Refusing therapies intermittently, increasingly agitated with attempts to encourage.  Add Seroquel  25 mg twice daily as needed for agitation.   -  Invega last filled March 2025, would not  restart at this time before discussing reasoning/side effects with mother. --They are unsure about this medication - Move therapies to daily for tolerance.  09-10-24: Increase trazodone  to 100 mg nightly--sleeping well  8/2- pt reports slept well 5. Neuropsych/cognition: This patient is not capable of making decisions on her own behalf.  - 7/22: Started on telesitter for OOB attempts; discussed with patient and mom possible veil bed if behaviors continue--has not had any further issues  6. Skin/Wound Care: Routine skin checks   - 7/15: Unstageable sacral PI; awaiting WC consult. Safety zone report completed.   Cleanse with Vashe [id number], not rinse, pat dry the peri-wound skin. Apply Xeroform on the wound bed daily. Cover with sacrum foam dressing changing every 3 days or PRN soiling.   7/22: Wound care, changing to Santyl  for debridement daily, then cover with moist 2 x 2 and foam dressing with dressing changes every 3 days or as needed. --Looking better.  7. Fluids/Electrolytes/Nutrition: Routine in and outs with follow-up chemistries   - 7/15: Albumin 2.3; monitor PO intakes, Ensure BID. See #15 below.   - 7-21: No AM labs; will order for late today--mild hyponatremia, 132.  Other labs are stable.  Repeat in a.m, may have been secondary to Keflex  over the weekend which is now discontinued.  7-22: Hyponatremia-resolved.  8/2- Na 137 8.  CAD/Non-STEMI/acute on chronic diastolic congestive heart failure.  Cardiac catheterization found to have chronic LAD.  Continue aspirin  therapy   - daily weights --not being performed, will reorder today   - 7/29: Complaining of intermittent CP, not reproducible, c/w stable angina, resolved at time of exam. Known near-total occlusion of LAD. Reminded patient to be vocal when this occurs. Getting EKG, orthostats and CXR for eval  7-30: EKG unchanged, chest x-ray normal, orthostats positive; ordered gentle IV fluids 500 cc today, patient refusing.  Reduce Flomax   to 0.4 mg daily.  8-1: Very low blood pressure, tachycardic.  DC Flomax .  EKG, labs pending.  Will give 1 L IV  fluid at 250 cc/h due to heart failure.  8/2- NO weight this AM- pt refused per chart  8/3- No weight this Am- will d/w nursing [facility] Weights   09/17/23 0544 09/18/23 0538 09/19/23 0644  Weight: 83 kg 83 kg 83 kg     9.  Diabetes mellitus with peripheral neuropathy.  Hemoglobin A1c 4.8.  NovoLog  5 units 3 times daily, Semglee  12 units daily             -CBGs well controlled--intermittent elevations but overall well-controlled  8/2- Cbgs overall good, but intermittent bumps to 229 today0 con't to monitor trend  8/3- looking better today Recent Labs    09/20/23 1635 09/20/23 2112 09/21/23 0635  GLUCAP 117* 146* 128*     10.  Leukocytosis/UTI.  Antibiotic therapy completed 11.  Hyperammonemia.  Continue Chronulac  10 mg twice daily.  Follow-up ammonia levels             -last ammonia level 20 on 7/9  12.  Hypothyroidism.  Synthroid  13.  Documented CKD stage III.  Latest creatinine 0.95.  Follow-up chemistries.  Follow-up with [facility] nephrology  - BUN/creatinine have remained stable within her normal range  14.  Chronic anemia.  Follow-up CBC. Stable 9-10.  Add iron supplement.  15.  Decreased nutritional storage.  Diet advanced to regular.  Follow-up dietary services             -intake poor/inconsistent             -engage RD for assistance   16.  COPD/tobacco use/marijuana use.  Continue inhalers as directed.  Check oxygen saturations every shift.  Urine drug screen negative.  Provide counseling   17. Urinary retention/relapsing UTI. Foley replaced 7/4. Awaiting WC consult as above for recs prior to removal--see above, DC foley trial started 7/15.    - 7/16 2x ISC overnight; 1x incontinent void; increase flomax  to 0.8 mg, start timed toiletting Q4-6 hours  - 7/17: Start bethenachol 10 mg TID for stimulation; get urinalysis  - 7-18: No improvement in urinary retention;  urinalysis borderline but will opt to treat in light of symptoms.  Start Keflex  500 mg twice daily for 7 days.  Add on urine culture--follow-up on results- > 100K Klebsiealla p await sensitivities   7-21: Pan resistant Klebsiella pneumonia; .  With same species on 7-3 urine culture, was through course of meropenem  at that time.  Pharmacy started Invanz  1 g for 3 doses yesterday for ESBL; discussing with ID pharmacy given failure of their first treatment.  Will get renal ultrasound today to evaluate for structural causes of recurrence  7-22: Improving continence, continue current regimen.  Renal ultrasound with some mild left-sided hydronephrosis, no other significant findings.  Since she has resumed spontaneous urination, will monitor for now, may consider CT abdomen and pelvis if suspicion for obstructing stone  7-23: DC bethanechol ; continue PVRs, discussed with nursing  7-24: Started retaining again overnight, resume bethanechol  10 mg 3 times daily.  Will get CT renal study today to evaluate.  7-25: Symptoms resolved with bethanechol , CT study with some signs of cystitis but with recent UTI this could be residual.  No stones, no other features concerning for bladder obstruction.  Would continue through today and then may try to wean to 5 mg 3 times daily over the weekend, but would not discontinue. - Will need outpatient urology follow-up  7/26-27--incontinent. No retention reported but no scans done either.  -will reduce urecholine  to 5mg  tid 7-28:  Voiding but incontinent with current regimen; resume PVRs--did remind nursing that these needs to be done to monitor medication changes.  Urinalysis today improved.  7/29: Retained again today; increase bethenachol to 10 mg TID.   7-30: No further retention on increased dose.  Reduce Flomax  to 0.4 mg due to orthostasis.  7-31: Patient reporting dysuria again, mom reporting foul urine.  Is continuing to void continent with reduced Flomax .  Start Pyridium   3 times daily, will get repeat urinalysis--looks positive, presumably reinfection vs. colonization with Klebsiella again.  Given no other signs of infection aside from ongoing retention, will hold off on repeat treatment at this time, get urology evaluation in a.m.  8/1: DC Flomax  due to orthostasis, resume PVRs.  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  Recommendations by both no further antibiotics, likely colonized, continue bethanechol  10 mg 3 times daily and get outpatient urology assessment.  Likely secondary to diabetes, peripheral neuropathy, neurogenic bladder and recent hospitalization--has likely been retaining for some time.  --Needs PVRs over this weekend 8/2- Ordered Bladder scans so can schedule- q6 hours for 3 days- don't see PVR order? 8/3 PVR x2 0-10cc- still c/o dysuria- will change Pyridium  to scheduled 100 mg TID x 2 days 18. Constipation. Small, hard BM this AM. Add Miralax  daily, sennakot s 1 tab BID.  On Chronulac  10 mg twice daily.   - BM 7/16, large, with some rectal bleeding--add cream  -7-21: Multiple large, incontinent, liquid bowel movements overnight.  Likely secondary to antibiotics, DC daily Senokot, Chronulac --continue miralax   7-23: Continuing loose bowel movements, likely complicated by bethanechol ; move MiraLAX  as needed  7/27--moved bowels, small   8/2- LBM 7/31 per chart review- pt reports doesn't remember- if no BM by tomorrow, will need to intervene  8/3- will order Sorbitol - pt Ok'd will take- 30 cc at noon 19. L drop arm. ?RTC. Patient endorses fall 2 months ago with no workup. Will xray.   - 7/16: Xray w/o acute fractuire or dislocation  - 7/29: Palpable muscle retraction at glenoid, nonpainful ROM deficits c/w RTC; will need OP ortho eval once medically stable  20. L calcified carotid plaque. CTA vs US  evaluation as OP.    21. Hx CVA with chronic R HP 2016 noted Left parietal on FLAIR and DWI but had a large DDX including CIN, PRES, was at [facility] center 06/14/14-06/24/14, given persistent of MRI finding, CVA is the cause  for R HP   7-21: Patient denies right hip pain on today's exam  22. AKI. Likely d/t dehydration, UTI.  - Give 1 L IVF normal saline at 100 cc/hr today. Repeat BMP in AM.  - Encourage PO fluids - 7/23: Cr improved; can stop IV fluids.  7/27 eating and drinking fairly well, not sure how accurate fluids are being tracked  7-28: Mild creatinine increase, recurrent.  Not yet at AKI level.  Will monitor with routine labs   - 7/31; BUN doubled today, creatinine staying stable.  Patient refusing IV fluid, really encouraging p.o. fluid intakes with nursing today.  Repeat in AM.  8/2- Cr down to 1.11- and BUN down to 29 from 35- will recheck Monday unless symptomatic.   8/3- labs in AM   I spent a total of  39  minutes on total care today- >50% coordination of care- due to  D/w nursing and charge; also reviewed chart and vitals as well as d/w  pt and exam and documentation.   LOS: 20 days A FACE TO FACE EVALUATION WAS PERFORMED  Bhavika Schnider 09/21/2023, 9:52 AM

## 2023-09-22 ENCOUNTER — Other Ambulatory Visit: Payer: Self-pay | Admitting: Physical Medicine and Rehabilitation

## 2023-09-22 ENCOUNTER — Other Ambulatory Visit (HOSPITAL_COMMUNITY): Payer: Self-pay

## 2023-09-22 LAB — CARBAPENEM RESISTANCE PANEL
Carba Resistance IMP Gene: NOT DETECTED
Carba Resistance KPC Gene: NOT DETECTED
Carba Resistance NDM Gene: NOT DETECTED
Carba Resistance OXA48 Gene: NOT DETECTED
Carba Resistance VIM Gene: NOT DETECTED

## 2023-09-22 LAB — URINE CULTURE: Culture: >=100000 — AB

## 2023-09-22 LAB — CBC WITH DIFFERENTIAL/PLATELET
Abs Immature Granulocytes: 0.05 K/uL (ref 0.00–0.07)
Basophils Absolute: 0.1 K/uL (ref 0.0–0.1)
Basophils Relative: 1 %
Eosinophils Absolute: 0.3 K/uL (ref 0.0–0.5)
Eosinophils Relative: 3 %
HCT: 36.7 % (ref 36.0–46.0)
Hemoglobin: 11.4 g/dL — ABNORMAL LOW (ref 12.0–15.0)
Immature Granulocytes: 1 %
Lymphocytes Relative: 29 %
Lymphs Abs: 3.1 K/uL (ref 0.7–4.0)
MCH: 28.1 pg (ref 26.0–34.0)
MCHC: 31.1 g/dL (ref 30.0–36.0)
MCV: 90.6 fL (ref 80.0–100.0)
Monocytes Absolute: 0.7 K/uL (ref 0.1–1.0)
Monocytes Relative: 7 %
Neutro Abs: 6.3 K/uL (ref 1.7–7.7)
Neutrophils Relative %: 59 %
Platelets: 291 K/uL (ref 150–400)
RBC: 4.05 MIL/uL (ref 3.87–5.11)
RDW: 16.9 % — ABNORMAL HIGH (ref 11.5–15.5)
WBC: 10.6 K/uL — ABNORMAL HIGH (ref 4.0–10.5)
nRBC: 0 % (ref 0.0–0.2)

## 2023-09-22 LAB — COMPREHENSIVE METABOLIC PANEL WITH GFR
ALT: 21 U/L (ref 0–44)
AST: 23 U/L (ref 15–41)
Albumin: 3 g/dL — ABNORMAL LOW (ref 3.5–5.0)
Alkaline Phosphatase: 81 U/L (ref 38–126)
Anion gap: 12 (ref 5–15)
BUN: 32 mg/dL — ABNORMAL HIGH (ref 8–23)
CO2: 24 mmol/L (ref 22–32)
Calcium: 9.6 mg/dL (ref 8.9–10.3)
Chloride: 103 mmol/L (ref 98–111)
Creatinine, Ser: 1.15 mg/dL — ABNORMAL HIGH (ref 0.44–1.00)
GFR, Estimated: 54 mL/min — ABNORMAL LOW (ref >60)
Glucose, Bld: 177 mg/dL — ABNORMAL HIGH (ref 70–99)
Potassium: 4.1 mmol/L (ref 3.5–5.1)
Sodium: 139 mmol/L (ref 135–145)
Total Bilirubin: 0.7 mg/dL (ref 0.0–1.2)
Total Protein: 6.6 g/dL (ref 6.5–8.1)

## 2023-09-22 LAB — GLUCOSE, CAPILLARY
Glucose-Capillary: 130 mg/dL — ABNORMAL HIGH (ref 70–99)
Glucose-Capillary: 141 mg/dL — ABNORMAL HIGH (ref 70–99)
Glucose-Capillary: 114 mg/dL — ABNORMAL HIGH (ref 70–99)

## 2023-09-22 MED ORDER — INSULIN GLARGINE 100 UNIT/ML SOLOSTAR PEN
12.0000 [IU] | PEN_INJECTOR | Freq: Every day | SUBCUTANEOUS | 0 refills | Status: AC
  Filled 2023-09-22: qty 15, 125d supply, fill #0

## 2023-09-22 MED ORDER — FLUTICASONE FUROATE-VILANTEROL 200-25 MCG/ACT IN AEPB
1.0000 | INHALATION_SPRAY | Freq: Every day | RESPIRATORY_TRACT | Status: DC
  Filled 2023-09-22: qty 28

## 2023-09-22 MED ORDER — MOMETASONE FURO-FORMOTEROL FUM 100-5 MCG/ACT IN AERO
2.0000 | INHALATION_SPRAY | Freq: Two times a day (BID) | RESPIRATORY_TRACT | 0 refills | Status: AC
Start: 2023-09-22 — End: ?
  Filled 2023-09-22: qty 13, 30d supply, fill #0

## 2023-09-22 MED ORDER — ASCORBIC ACID 1000 MG PO TABS
1000.0000 mg | ORAL_TABLET | Freq: Every day | ORAL | 0 refills | Status: AC
  Filled 2023-09-22: qty 30, 30d supply, fill #0

## 2023-09-22 MED ORDER — MOMETASONE FURO-FORMOTEROL FUM 200-5 MCG/ACT IN AERO
1.0000 | INHALATION_SPRAY | Freq: Two times a day (BID) | RESPIRATORY_TRACT | 0 refills | Status: DC
  Filled 2023-09-22: qty 13, 60d supply, fill #0

## 2023-09-22 MED ORDER — LEVOTHYROXINE SODIUM 100 MCG PO TABS
100.0000 ug | ORAL_TABLET | Freq: Every morning | ORAL | 0 refills | Status: AC
  Filled 2023-09-22: qty 30, 30d supply, fill #0

## 2023-09-22 MED ORDER — BETHANECHOL CHLORIDE 10 MG PO TABS
10.0000 mg | ORAL_TABLET | Freq: Three times a day (TID) | ORAL | 0 refills | Status: AC
  Filled 2023-09-22: qty 90, 30d supply, fill #0

## 2023-09-22 MED ORDER — QUETIAPINE FUMARATE 25 MG PO TABS
25.0000 mg | ORAL_TABLET | Freq: Two times a day (BID) | ORAL | 0 refills | Status: AC | PRN
  Filled 2023-09-22: qty 60, 30d supply, fill #0

## 2023-09-22 MED ORDER — LAMOTRIGINE 100 MG PO TABS
100.0000 mg | ORAL_TABLET | Freq: Every day | ORAL | 0 refills | Status: AC
  Filled 2023-09-22: qty 30, 30d supply, fill #0

## 2023-09-22 MED ORDER — POLYETHYLENE GLYCOL 3350 17 GM/SCOOP PO POWD
119.0000 g | Freq: Once | ORAL | Status: AC
  Administered 2023-09-22: 119 g via ORAL
  Filled 2023-09-22: qty 119

## 2023-09-22 MED ORDER — TRAZODONE HCL 100 MG PO TABS
100.0000 mg | ORAL_TABLET | Freq: Every day | ORAL | 0 refills | Status: AC
  Filled 2023-09-22: qty 30, 30d supply, fill #0

## 2023-09-22 MED ORDER — DOCUSATE SODIUM 100 MG PO CAPS
100.0000 mg | ORAL_CAPSULE | Freq: Two times a day (BID) | ORAL | 0 refills | Status: AC
  Filled 2023-09-22: qty 60, 30d supply, fill #0

## 2023-09-22 MED ORDER — HYDROCORTISONE (PERIANAL) 2.5 % EX CREA
TOPICAL_CREAM | Freq: Three times a day (TID) | CUTANEOUS | 0 refills | Status: AC | PRN
  Filled 2023-09-22: qty 30, 10d supply, fill #0

## 2023-09-22 MED ORDER — TAMSULOSIN HCL 0.4 MG PO CAPS
0.4000 mg | ORAL_CAPSULE | Freq: Every day | ORAL | 0 refills | Status: AC
  Filled 2023-09-22: qty 30, 30d supply, fill #0

## 2023-09-22 MED ORDER — SORBITOL 70 % SOLN: 30.0000 mL | Freq: Once | Status: DC

## 2023-09-22 MED ORDER — ASPIRIN 81 MG PO TBEC
81.0000 mg | DELAYED_RELEASE_TABLET | Freq: Every morning | ORAL | 0 refills | Status: AC
  Filled 2023-09-22: qty 30, 30d supply, fill #0

## 2023-09-22 MED ORDER — COLLAGENASE 250 UNIT/GM EX OINT: TOPICAL_OINTMENT | Freq: Every day | CUTANEOUS | 0 refills | Status: DC

## 2023-09-22 MED ORDER — INSULIN PEN NEEDLE 32G X 4 MM MISC
1.0000 | Freq: Every day | 0 refills | Status: AC
  Filled 2023-09-22: qty 100, 90d supply, fill #0

## 2023-09-22 MED ORDER — COLLAGENASE 250 UNIT/GM EX OINT
TOPICAL_OINTMENT | Freq: Every day | CUTANEOUS | 0 refills | Status: DC
  Filled 2023-09-22: qty 30, 10d supply, fill #0

## 2023-09-22 MED ORDER — BUSPIRONE HCL 15 MG PO TABS
15.0000 mg | ORAL_TABLET | Freq: Two times a day (BID) | ORAL | 0 refills | Status: AC
Start: 2023-09-22 — End: ?
  Filled 2023-09-22: qty 60, 30d supply, fill #0

## 2023-09-22 MED ORDER — FERROUS SULFATE 325 (65 FE) MG PO TABS
325.0000 mg | ORAL_TABLET | Freq: Two times a day (BID) | ORAL | 0 refills | Status: AC
  Filled 2023-09-22: qty 30, 15d supply, fill #0

## 2023-09-22 MED ORDER — TAMSULOSIN HCL 0.4 MG PO CAPS
0.4000 mg | ORAL_CAPSULE | Freq: Every day | ORAL | Status: DC
  Administered 2023-09-22 – 2023-09-23 (×2): 0.4 mg via ORAL
  Filled 2023-09-22 (×2): qty 1

## 2023-09-22 MED ORDER — FOLIC ACID 1 MG PO TABS
1.0000 mg | ORAL_TABLET | Freq: Every day | ORAL | 0 refills | Status: AC
  Filled 2023-09-22: qty 30, 30d supply, fill #0

## 2023-09-22 NOTE — Plan of Care (Signed)
  Problem: RH Swallowing Goal: LTG Patient will consume least restrictive diet using compensatory strategies with assistance (SLP) Description: LTG:  Patient will consume least restrictive diet using compensatory strategies with assistance (SLP) Outcome: Completed/Met   Problem: RH Cognition - SLP Goal: RH LTG Patient will demonstrate orientation with cues Description:  LTG:  Patient will demonstrate orientation to person/place/time/situation with cues (SLP)   Outcome: Completed/Met   Problem: RH Expression Communication Goal: LTG Patient will verbally express basic/complex needs(SLP) Description: LTG:  Patient will verbally express basic/complex needs, wants or ideas with cues  (SLP) Outcome: Completed/Met Goal: LTG Patient will increase word finding of common (SLP) Description: LTG:  Patient will increase word finding of common objects/daily info/abstract thoughts with cues using compensatory strategies (SLP). Outcome: Completed/Met   Problem: RH Problem Solving Goal: LTG Patient will demonstrate problem solving for (SLP) Description: LTG:  Patient will demonstrate problem solving for basic/complex daily situations with cues  (SLP) Outcome: Completed/Met   Problem: RH Memory Goal: LTG Patient will use memory compensatory aids to (SLP) Description: LTG:  Patient will use memory compensatory aids to recall biographical/new, daily complex information with cues (SLP) Outcome: Completed/Met   Problem: RH Attention Goal: LTG Patient will demonstrate this level of attention during functional activites (SLP) Description: LTG:  Patient will will demonstrate this level of attention during functional activites (SLP) Outcome: Completed/Met

## 2023-09-22 NOTE — Progress Notes (Signed)
 Physical Therapy Discharge Summary  Patient Details  Name: Sarah Davis MRN: [id number] Date of Birth: 15-Oct-1960  Date of Discharge from PT service:September 22, 2023  Today's Date: 09/22/2023 PT Individual Time: 1118-1202 PT Individual Time Calculation (min): 44 min    Patient has met 9 of 9 long term goals due to improved activity tolerance, improved balance, improved postural control, increased strength, improved attention, improved awareness, and improved coordination.  Patient to discharge at an ambulatory level Supervision.   Patient's care partner is independent to provide the necessary physical and cognitive assistance at discharge.  Reasons goals not met: NA  Recommendation:  Patient will benefit from ongoing skilled PT services in outpatient setting to continue to advance safe functional mobility, address ongoing impairments in balance, ambulation, endurance, and minimize fall risk.  Equipment: No equipment provided  Reasons for discharge: treatment goals met and discharge from hospital  Patient/family agrees with progress made and goals achieved: Yes  Skilled Therapeutic Interventions: Pt received supine in bed asleep. Awakens to verbal stimuli and denies pain, agrees to therapy. Bewd mobility independent. Pt performs sit to stand with cues for initiation. Pt ambulates x200' to gym without AD, with cues for increasing trunk rotation and arm swing to improve balance. Pt takes seated rest break prior to ambulating x150' to ortho gym and completing car transfer and ramp navigation with cues for sequencing. Pt takes seated rest break then ambulates back to main gym with same assistance and cues. Pt completes x12 6 steps with cues for step sequencing and safety. Following rest break, pt ambulates to dayroom and completes Nustep for endurance training, completing x8:00 at workload of 3 with average steps per minute ~45. PT provides cues for hand and foot placement and completing full  available ROM. Pt ambulates back to room. Left supine with call bell in reach   PT Discharge Precautions/Restrictions Precautions Precautions: Fall Restrictions Weight Bearing Restrictions Per Provider Order: No Pain Interference Pain Interference Pain Effect on Sleep: 1. Rarely or not at all Pain Interference with Therapy Activities: 1. Rarely or not at all Pain Interference with Day-to-Day Activities: 1. Rarely or not at all Vision/Perception  Vision - History Ability to See in Adequate Light: 0 Adequate Perception Perception: Within Functional Limits Praxis Praxis: Impaired Praxis Impairment Details: Initiation;Ideation;Ideomotor;Motor planning  Cognition Overall Cognitive Status: Impaired/Different from baseline Arousal/Alertness: Awake/alert Orientation Level: Oriented X4 Year: 2025 Month: August Day of Week: Correct Attention: Selective Sustained Attention: Impaired Sustained Attention Impairment: Verbal basic;Functional basic Selective Attention: Impaired Selective Attention Impairment: Verbal basic;Functional basic Memory: Impaired Memory Impairment: Storage deficit;Retrieval deficit;Decreased short term memory Decreased Short Term Memory: Verbal complex;Functional complex Awareness: Impaired Awareness Impairment: Intellectual impairment Problem Solving: Impaired Problem Solving Impairment: Verbal complex;Functional complex Executive Function: Initiating;Self Monitoring;Self Correcting Initiating: Impaired Initiating Impairment: Verbal basic;Functional basic Self Monitoring: Impaired Self Monitoring Impairment: Verbal basic;Functional basic Self Correcting: Impaired Self Correcting Impairment: Verbal basic;Functional basic Behaviors: Verbal agitation;Perseveration;Poor frustration tolerance Safety/Judgment: Impaired Sensation Sensation Light Touch: Impaired Detail Central sensation comments: Hx neuropathy, numbness in hands and feet Hot/Cold: Appears  Intact Coordination Gross Motor Movements are Fluid and Coordinated: No Fine Motor Movements are Fluid and Coordinated: Yes Coordination and Movement Description: Generalized weakness and ataxia, although much improved Motor  Motor Motor: Ataxia  Mobility Bed Mobility Bed Mobility: Supine to Sit;Sit to Supine Supine to Sit: Supervision/Verbal cueing Sit to Supine: Supervision/Verbal cueing Transfers Transfers: Sit to Stand;Stand to Sit;Stand Pivot Transfers Sit to Stand: Supervision/Verbal cueing Stand to Sit: Supervision/Verbal cueing Stand Pivot Transfers:  Supervision/Verbal cueing Transfer (Assistive device): None Locomotion  Gait Ambulation: Yes Gait Assistance: Supervision/Verbal cueing Gait Distance (Feet): 200 Feet Assistive device: None Gait Assistance Details: Verbal cues for sequencing;Verbal cues for technique;Verbal cues for precautions/safety Gait Gait: Yes Gait Pattern: Impaired Gait Pattern: Ataxic;Wide base of support Gait velocity: dec Stairs / Additional Locomotion Stairs Assistance: Supervision/Verbal cueing Stair Management Technique: Two rails Number of Stairs: 12 Height of Stairs: 6 Ramp: Supervision/Verbal cueing Curb: Supervision/Verbal cueing Wheelchair Mobility Wheelchair Mobility: No  Trunk/Postural Assessment  Cervical Assessment Cervical Assessment: Within Functional Limits Thoracic Assessment Thoracic Assessment: Within Functional Limits Lumbar Assessment Lumbar Assessment: Within Functional Limits Postural Control Postural Control: Deficits on evaluation Trunk Control: decreased Righting Reactions: delayed  Balance Balance Balance Assessed: Yes Static Sitting Balance Static Sitting - Balance Support: Feet supported Static Sitting - Level of Assistance: 7: Independent Dynamic Sitting Balance Dynamic Sitting - Balance Support: Feet supported Dynamic Sitting - Level of Assistance: 6: Modified independent (Device/Increase  time) Static Standing Balance Static Standing - Balance Support: During functional activity Static Standing - Level of Assistance: 5: Stand by assistance Dynamic Standing Balance Dynamic Standing - Balance Support: During functional activity Dynamic Standing - Level of Assistance: 5: Stand by assistance Extremity Assessment  RUE Assessment RUE Assessment: Exceptions to [facility] General Strength Comments: Holds in flexion at times, likely from old CVA. Proximal strength at 3+/5 and functional in use LUE Assessment LUE Assessment: Exceptions to [facility] General Strength Comments: Can flex shoulder to about 30 degrees. Positive drop arm test. reports she injured in a fall. MD aware and pt will f/u with ortho LUE Body System: Ortho RLE Assessment General Strength Comments: Grossly 4/5 LLE Assessment General Strength Comments: Grossly 4/5   [name], PT, DPT 09/22/2023, 4:57 PM

## 2023-09-22 NOTE — Progress Notes (Signed)
 Occupational Therapy Session Note  Patient Details  Name: Sarah Davis MRN: [id number] Date of Birth: [birth year]  Today's Date: 09/22/2023 OT Individual Time: 9154-9089 OT Individual Time Calculation (min): 25 min    Short Term Goals: Week 3:  OT Short Term Goal 1 (Week 3): STG= LTG d/t ELOS  Skilled Therapeutic Interventions/Progress Updates:    Pt received supine with no c/o pain, agreeable to OT session focused on ADL retraining. She was pleasant and motivated to participate in shower. She completed bed mobility to EOB with (S). She doffed all clothing with good initiation. She completed sit > stand from EOB with (S). CGA- (S) level functional mobility to the shower. She completed shower level bathing seated on TTB with close (S). Min cueing for thoroughness of bathing. She returned to EOB with (S). She dressed fully with (S), shirt and pants. She initially stated she wanted to complete more functional mobility but the lab arrived for blood draw and following this she wanted to remain supine. She was left supine with all needs met, bed alarm set.   Therapy Documentation Precautions:  Restrictions Weight Bearing Restrictions Per Provider Order: No  Therapy/Group: Individual Therapy  [name] 09/22/2023, 9:23 AM

## 2023-09-22 NOTE — Progress Notes (Addendum)
 Speech Language Pathology Discharge Summary  Patient Details  Name: Sarah Davis MRN: [id number] Date of Birth: [birth year]  Date of Discharge from SLP service:September 22, 2023  Today's Date: 09/22/2023 SLP Individual Time: 0930-1000 SLP Individual Time Calculation (min): 30 min   Skilled Therapeutic Interventions:    Skilled therapy session focused on cognitive goals. SLP facilitated session by re-evaluating patients cognitive linguistic abilities through use of the Cognistat. Patient scored WFL on all subtests despite moderate deficits in registration, calculations and judgement. Patient with functional expressive/receptive language this date and is 100% intelligible at the conversational level. Deficits likely due to lack of participation as patient began stating I don't know consistently towards the end of the assessment. Patient recalled d/c plans and reports unwillingness to participate in further therapy until d/c. Patient independently recalled breakfast items consumed this AM. Patient left in bed with alarm set and call bell in reach. Continue POC    Patient has met 7 of 7 long term goals.  Patient to discharge at overall Supervision;Mod;Min level.  Reasons goals not met: n/a   Clinical Impression/Discharge Summary: Pt has made excellent gains and has met 6 of 6 LTG's this admission due to improved dysphagia, communication and cognition. Pt is currently an overall min-modA for cognitive tasks and requires supervision cues for utilization of swallowing compensatory strategies to minimize overt s/sx of aspiration with D3/thin diet. Patient is at a minA level for expressive language goals. Pt/family education complete and pt will discharge home with 24 hour supervision from friends/family/etc. Pt would benefit from  f/u ST services to maximize cognition in order to maximize functional independence.   Care Partner:  Caregiver Able to Provide Assistance: Yes  Type of Caregiver Assistance:  Physical;Cognitive  Recommendation:  24 hour supervision/assistance;Home Health SLP;Outpatient SLP  Rationale for SLP Follow Up: Maximize cognitive function and independence   Equipment: n/a   Reasons for discharge: Treatment goals met;Discharged from hospital   Patient/Family Agrees with Progress Made and Goals Achieved: Yes    Milta Croson M.A., CCC-SLP 09/22/2023, 10:47 AM

## 2023-09-22 NOTE — Progress Notes (Signed)
 Nutrition Follow-up  DOCUMENTATION CODES:   Not applicable  INTERVENTION:   Encourage intake of Ensure Plus High Protein PO BID, each supplement provides 350 kcal and 20 grams of protein. Encourage intake of Juven PO BID, each packet provides 80 calories, 8 grams of carbohydrate, 2.5  grams of protein (collagen), 7 grams of L-arginine and 7 grams of L-glutamine; supplement contains CaHMB, Vitamins C, E, B12 and Zinc  to promote wound healing. Continue MVI with minerals daily.  NUTRITION DIAGNOSIS:   Increased nutrient needs related to wound healing as evidenced by estimated needs.  Ongoing   GOAL:   Patient will meet greater than or equal to 90% of their needs  Progressing   MONITOR:   PO intake, Supplement acceptance, Diet advancement, Labs, Skin  REASON FOR ASSESSMENT:   Consult Assessment of nutrition requirement/status, Other (Comment), Wound healing (Maintain follow up from acute care)  ASSESSMENT:   Pt with hx of CKD3, COPD, CAD, hx CVA, pancreatitis, right hemiparesis, Hepatitis C, CHF,  and DM type 2 presented to ED from [facility] for non-STEMI and underwent cardiac cath where she was found to have chronic LAD. Now transferred to CIR.  Patient remains on a dysphagia 3 diet with thin liquids. Meal intakes averaging 82% over the past 3 days. She states is drinking the Ensure and Juven supplements between meals.   Discussed diabetes diet recommendations with patient's mother at her request. We also reviewed appropriate supplements for patient when she goes home.   Labs reviewed.  CBG: [phone]  Medications reviewed and include vitamin C , colace, ferrous sulfate , folic acid , novolog , semglee , MVI with minerals, Juven, flomax .  CIR admit weight: 52.3 kg (7/15) Current weight: 53.8 kg (8/4)  Diet Order:   Diet Order             DIET DYS 3 Fluid consistency: Thin  Diet effective now                   EDUCATION NEEDS:   Not appropriate for  education at this time  Skin:  Skin Assessment: Skin Integrity Issues: Skin Integrity Issues:: Unstageable Unstageable: Sacrum  Last BM:  7/31 type 4  Height:   Ht Readings from Last 1 Encounters:  09/01/23 5' (1.524 m)    Weight:   Wt Readings from Last 1 Encounters:  09/22/23 53.8 kg    BMI:  Body mass index is 23.16 kg/m.  Estimated Nutritional Needs:   Kcal:  1500-1700 kcal  Protein:  75-90 gm /day  Fluid:  >1.5L/day   Suzen HUNT RD, LDN, CNSC Contact via secure chat. If unavailable, use group chat RD Inpatient.

## 2023-09-22 NOTE — Plan of Care (Signed)
  Problem: Consults Goal: RH GENERAL PATIENT EDUCATION Description: See Patient Education module for education specifics. Outcome: Progressing   Problem: RH BOWEL ELIMINATION Goal: RH STG MANAGE BOWEL WITH ASSISTANCE Description: STG Manage Bowel with minimal Assistance. Outcome: Progressing   Problem: RH BLADDER ELIMINATION Goal: RH STG MANAGE BLADDER WITH ASSISTANCE Description: STG Manage Bladder With minimal Assistance Outcome: Progressing   Problem: RH SKIN INTEGRITY Goal: RH STG SKIN FREE OF INFECTION/BREAKDOWN Description: Manage skin free of infection/breakdown with minimal assistance Outcome: Progressing   Problem: RH SAFETY Goal: RH STG ADHERE TO SAFETY PRECAUTIONS W/ASSISTANCE/DEVICE Description: STG Adhere to Safety Precautions With minimal Assistance/Device. Outcome: Progressing   Problem: RH PAIN MANAGEMENT Goal: RH STG PAIN MANAGED AT OR BELOW PT'S PAIN GOAL Description: < 4 w/prns Outcome: Progressing   Problem: RH KNOWLEDGE DEFICIT GENERAL Goal: RH STG INCREASE KNOWLEDGE OF SELF CARE AFTER HOSPITALIZATION Description: Manage increase knowledge of self care after hospitalization with minimal assistance from family using educational materials provided Outcome: Progressing

## 2023-09-22 NOTE — Progress Notes (Signed)
 Wound care provided per MD order. Patient tolerated well.    Geni Armor, LPN

## 2023-09-22 NOTE — Progress Notes (Signed)
 PROGRESS NOTE   Subjective/Complaints:  No events overnight.  Patient endorsing some ongoing dysuria, but no further concerns.  Thinks she will have a bowel movement today.  Vitals stable     09/22/2023    8:00 AM 09/22/2023    5:04 AM 09/21/2023    9:17 PM  Vitals with BMI  Weight 118 lbs 10 oz 190 lbs 8 oz   BMI 23.16 37.2   Systolic  123 103  Diastolic  53 70  Pulse  65 82    Recent Labs    09/21/23 1140 09/21/23 2139 09/22/23 0550  GLUCAP 169* 95 130*     P.o. intakes appropriate  Continent of bladder; has been refusing bladder scans   ROS: limited by cognition/behavior + dysuria   + Constipation   Objective:   No results found.     Recent Labs    09/19/23 2050 09/20/23 1112  WBC 10.8* 9.1  HGB 10.8* 10.5*  HCT 34.2* 33.4*  PLT 265 260      Recent Labs    09/19/23 1835 09/20/23 1112  NA 137 137  K 4.2 3.7  CL 101 104  CO2 23 23  GLUCOSE 162* 234*  BUN 35* 29*  CREATININE 1.52* 1.11*  CALCIUM  9.8 9.0      Intake/Output Summary (Last 24 hours) at 09/22/2023 0828 Last data filed at 09/22/2023 0809 Gross per 24 hour  Intake 1130 ml  Output 102 ml  Net 1028 ml        Physical Exam: Vital Signs Blood pressure (!) 123/53, pulse 65, temperature 98.3 F (36.8 C), resp. rate 18, height 5' (1.524 m), weight 53.8 kg, SpO2 95%.   General: Appropriate appearance for age.  No acute distress.  Sitting up at bedside. HENT: conjugate gaze; oropharynx dry CV: regular rate and rhythm- however rate in 90's; no JVD Pulmonary: CTA B/L; no W/R/R- good air movement GI: soft, NT, ND, (+)BS- hypoactive Psychiatric: Appropriate mood and affect.  Somewhat flat. Neurological: Awake, alert, and oriented x 2.  Some mild to moderate memory deficits. Skin:  Unstageable buttocks with adherent slough; MASD surrounding--stable   MSK:        + L shoulder drop-arm; palpable enlargement along L lateral  glenoid. No TTP  No chest pain reproducible on palpation  Neurologic exam:  Cognition: Oriented to person, place, and time.  Limited insight and awareness. Mood: Flat affect, intermittently agitated Sensation: Equal and intact in BL UE and Les.  Reflexes: 2+ in BL UE and LEs. CN: 2-12 grossly intact.  Coordination: BL UE intention tremor--none apparent at rest Strength: Moving all 4 extremities at least 5/5.   Neuro exam unchanged from the above on reexamination 09/22/23     Assessment/Plan: 1. Functional deficits which require 3+ hours per day of interdisciplinary therapy in a comprehensive inpatient rehab setting. Physiatrist is providing close team supervision and 24 hour management of active medical problems listed below. Physiatrist and rehab team continue to assess barriers to discharge/monitor patient progress toward functional and medical goals  Care Tool:  Bathing    Body parts bathed by patient: Right arm, Left arm, Chest, Abdomen, Front perineal area, Right upper leg,  Left upper leg, Face, Buttocks, Right lower leg, Left lower leg   Body parts bathed by helper: Buttocks     Bathing assist Assist Level: Supervision/Verbal cueing     Upper Body Dressing/Undressing Upper body dressing   What is the patient wearing?: Pull over shirt    Upper body assist Assist Level: Set up assist    Lower Body Dressing/Undressing Lower body dressing      What is the patient wearing?: Underwear/pull up, Pants     Lower body assist Assist for lower body dressing: Supervision/Verbal cueing     Toileting Toileting    Toileting assist Assist for toileting: Contact Guard/Touching assist     Transfers Chair/bed transfer  Transfers assist  Chair/bed transfer activity did not occur: Safety/medical concerns  Chair/bed transfer assist level: Supervision/Verbal cueing     Locomotion Ambulation   Ambulation assist      Assist level: Minimal Assistance - Patient >  75% Assistive device: Hand held assist Max distance: 150'   Walk 10 feet activity   Assist     Assist level: Minimal Assistance - Patient > 75% Assistive device: Hand held assist   Walk 50 feet activity   Assist Walk 50 feet with 2 turns activity did not occur: Safety/medical concerns  Assist level: Minimal Assistance - Patient > 75% Assistive device: Hand held assist    Walk 150 feet activity   Assist Walk 150 feet activity did not occur: Safety/medical concerns  Assist level: Minimal Assistance - Patient > 75% Assistive device: Hand held assist    Walk 10 feet on uneven surface  activity   Assist Walk 10 feet on uneven surfaces activity did not occur: Safety/medical concerns         Wheelchair     Assist Is the patient using a wheelchair?: Yes Type of Wheelchair: Manual    Wheelchair assist level: Dependent - Patient 0% Max wheelchair distance: 150'    Wheelchair 50 feet with 2 turns activity    Assist        Assist Level: Dependent - Patient 0%   Wheelchair 150 feet activity     Assist      Assist Level: Dependent - Patient 0%   Blood pressure (!) 123/53, pulse 65, temperature 98.3 F (36.8 C), resp. rate 18, height 5' (1.524 m), weight 53.8 kg, SpO2 95%.  Medical Problem List and Plan: 1. 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 Severe motor planning and apraxia, impulsive ex. Tried to sit mid-air. Max A overall but can power up with Min A. Will need 24/7 Min A on discharge.   7/22: Apraxia, initiation and sequencing better. Gait limited by poor therapy tolerance/behaviors. D3 diet with thin liquids, full supervision.   7-23: Reduce therapies to daily due to poor tolerance; will discuss with family earlier discharge date given continued refusal--per patient's mother, she  will participate as long as she is there, no plan to move date at this time  7/28: CGA to Min A goals with OT - progressing well. Doing better with participation this week. CGA to Min A ambulation without AD; c/b balance and endurance deficits. SLP moderate cog deficits c/b poor effort and behavior. Per mom, some wordfinding deficits but otherwise close to baseline - Mod A currently  with goal Min A.  8-1: Tachycardic, increased hypotension this afternoon.  Getting IV fluids, EKG, lab work; DC Flomax  as below.  Con't CIR PT, OT and SLP      2.  Antithrombotics: -DVT/anticoagulation:  Pharmaceutical: Lovenox              -antiplatelet therapy: Aspirin  81 mg daily  3. Pain Management: Tylenol  as needed  4. Mood/Behavior/Sleep/bipolar disorder II - poorly controlled: BuSpar  15 mg twice daily, Lamictal  titrating to 100 mg daily             -antipsychotic agents: N/A  - Per psych consult inpatient: Will plan to slowly titrate lamictal  since she has not been taking it for at least 1 month and to prevent risk of side effects such as SJS. Will also plan to start trazodone  at a lower dose compared to her home dose of 400 mg in setting of preventing additional sedation, altered mental status, and since she had been sleeping well while in the hospital. We will defer to outpatient psychiatry for titrating trazodone  if indicated. She can follow-up with her outpatient psychiatric provider at [facility] upon discharge.  - ?resumption on invega 3 mg; will defer to OP  -  Invega last filled March 2025, would not restart at this time before discussing reasoning/side effects with mother. --They are unsure about this medication - Move therapies to daily for tolerance. 09-10-24: Increase trazodone  to 100 mg nightly--sleeping well  5. Neuropsych/cognition: This patient is not capable of making decisions on her own behalf.  - 7/22: Started on telesitter for OOB attempts; discussed with patient and mom possible veil  bed if behaviors continue--intermittent impulsivity, continue TeleSitter  6. Skin/Wound Care: Routine skin checks   - 7/15: Unstageable sacral PI; awaiting WC consult. Safety zone report completed.   Cleanse with Vashe [id number], not rinse, pat dry the peri-wound skin. Apply Xeroform on the wound bed daily. Cover with sacrum foam dressing changing every 3 days or PRN soiling.   7/22: Wound care, changing to Santyl  for debridement daily, then cover with moist 2 x 2 and foam dressing with dressing changes every 3 days or as needed. --Looking better.  7. Fluids/Electrolytes/Nutrition: Routine in and outs with follow-up chemistries   - 7/15: Albumin 2.3; monitor PO intakes, Ensure BID. See #15 below.   - 7-21: No AM labs; will order for late today--mild hyponatremia, 132.  Other labs are stable.  Repeat in a.m, may have been secondary to Keflex  over the weekend which is now discontinued.  7-22: Hyponatremia-resolved.  8/2- Na 137  8-4: Labs stable/mild AKI as below.  Discussed with patient and mother at bedside improving fluid intakes.  8.  CAD/Non-STEMI/acute on chronic diastolic congestive heart failure.  Cardiac catheterization found to have chronic LAD.  Continue aspirin  therapy   - daily weights --not being performed, will reorder today   - 7/29: Complaining of intermittent CP, not reproducible, c/w stable angina, resolved at time of exam. Known near-total occlusion of LAD. Reminded patient to be vocal when this occurs. Getting EKG, orthostats and CXR for eval  7-30: EKG unchanged, chest x-ray normal, orthostats positive; ordered gentle IV fluids 500 cc today, patient refusing.  Reduce Flomax  to 0.4 mg daily.  8-1: Very low blood pressure, tachycardic.  DC Flomax .  EKG, labs pending.  Will give 1 L IV fluid at 250 cc/h due to heart failure.  8/2- NO weight this AM- pt refused per chart  8/3- No weight this Am- will d/w nursing  -  4: Weights inaccurate. Filed Weights   09/21/23 1000 09/22/23 0504  09/22/23 0800  Weight: 87 kg 86.4 kg 53.8 kg     9.  Diabetes mellitus with peripheral neuropathy.  Hemoglobin A1c 4.8.  NovoLog  5 units 3 times daily, Semglee  12 units daily             -CBGs well controlled--intermittent elevations but overall well-controlled  8/2- Cbgs overall good, but intermittent bumps to 229 today0 con't to monitor trend  8/3- looking better today Recent Labs    09/21/23 1140 09/21/23 2139 09/22/23 0550  GLUCAP 169* 95 130*     10.  Leukocytosis/UTI.  Antibiotic therapy completed.  See below 11.  Hyperammonemia.  Continue Chronulac  10 mg twice daily.  Follow-up ammonia levels             -last ammonia level 20 on 7/9--resolved  12.  Hypothyroidism.  Synthroid  13.  Documented CKD stage III.  Latest creatinine 0.95.  Follow-up chemistries.  Follow-up with [facility] neph[facility]  - BUN/creatinine have remained stable within her normal range  14.  Chronic anemia.  Follow-up CBC. Stable 9-10.  Add iron supplement.  15.  Decreased nutritional storage.  Diet advanced to regular.  Follow-up dietary services             -intake poor/inconsistent             -engage RD for assistance   - Improved/resolved  16.  COPD/tobacco use/marijuana use.  Continue inhalers as directed.  Check oxygen saturations every shift.  Urine drug screen negative.  Provide counseling   17. Urinary retention/relapsing UTI versus colonization Foley replaced 7/4. Awaiting WC consult as above for recs prior to removal--see above, DC foley trial started 7/15.    - 7/16 2x ISC overnight; 1x incontinent void; increase flomax  to 0.8 mg, start timed toiletting Q4-6 hours  - 7/17: Start bethenachol 10 mg TID for stimulation; get urinalysis  - 7-18: No improvement in urinary retention; urinalysis borderline but will opt to treat in light of symptoms.  Start Keflex  500 mg twice daily for 7 days.  Add on urine culture--follow-up on results- > 100K Klebsiealla p await sensitivities   7-21: Pan resistant  Klebsiella pneumonia; .  With same species on 7-3 urine culture, was through course of meropenem  at that time.  Pharmacy started Invanz  1 g for 3 doses yesterday for ESBL; discussing with ID pharmacy given failure of their first treatment.  Will get renal ultrasound today to evaluate for structural causes of recurrence  7-22: Improving continence, continue current regimen.  Renal ultrasound with some mild left-sided hydronephrosis, no other significant findings.  Since she has resumed spontaneous urination, will monitor for now, may consider CT abdomen and pelvis if suspicion for obstructing stone  7-23: DC bethanechol ; continue PVRs, discussed with nursing  7-24: Started retaining again overnight, resume bethanechol  10 mg 3 times daily.  Will get CT renal study today to evaluate.  7-25: Symptoms resolved with bethanechol , CT study with some signs of cystitis but with recent UTI this could be residual.  No stones, no other features concerning for bladder obstruction.  Would continue through today and then may try to wean to 5 mg 3 times daily over the weekend, but would not discontinue. - Will need outpatient urology follow-up  7/26-27--incontinent. No retention reported but no scans done either.  -will reduce urecholine  to 5mg  tid 7-28: Voiding but incontinent with current regimen; resume PVRs--did remind nursing that these needs to be  done to monitor medication changes.  Urinalysis today improved.  7/29: Retained again today; increase bethenachol to 10 mg TID.   7-30: No further retention on increased dose.  Reduce Flomax  to 0.4 mg due to orthostasis.  7-31: Patient reporting dysuria again, mom reporting foul urine.  Is continuing to void continent with reduced Flomax .  Start Pyridium  3 times daily, will get repeat urinalysis--looks positive, presumably reinfection vs. colonization with Klebsiella again.  Given no other signs of infection aside from ongoing retention, will hold off on repeat  treatment at this time, get urology evaluation in a.m.  8/1: DC Flomax  due to orthostasis, resume PVRs.  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  Recommendations by both no further antibiotics, likely colonized, continue bethanechol  10 mg 3 times daily and get outpatient urology assessment.  Likely secondary to diabetes, peripheral neuropathy, neurogenic bladder and recent hospitalization--has likely been retaining for some time.  --Needs PVRs over this weekend 8/2- Ordered Bladder scans so can schedule- q6 hours for 3 days- don't see PVR order? 8/3 PVR x2 0-10cc- still c/o dysuria- will change Pyridium  to scheduled 100 mg TID x 2 days 8-4: Moved Pyridium  to as needed..  She is continuing to do straight caths, will resume Flomax  0.4 mg.  Constipation can  contribute--see below  18. Constipation. Small, hard BM this AM. Add Miralax  daily, sennakot s 1 tab BID.  On Chronulac  10 mg twice daily.   - BM 7/16, large, with some rectal bleeding--add cream  -7-21: Multiple large, incontinent, liquid bowel movements overnight.  Likely secondary to antibiotics, DC daily Senokot, Chronulac --continue miralax     LBM 7/31 per chart review- pt reports doesn't remember- if no BM by tomorrow, will need to intervene  8/3- will order Sorbitol - pt Ok'd will take- 30 cc at noon  8-4: No results of sorbitol , will get bowel prep today--large bowel movement  19. L drop arm. ?RTC. Patient endorses fall 2 months ago with no workup. Will xray.   - 7/16: Xray w/o acute fractuire or dislocation  - 7/29: Palpable muscle retraction at glenoid, nonpainful ROM deficits c/w RTC; will need OP ortho eval once medically stable  20. L calcified carotid plaque. CTA vs US  evaluation as OP.   21. Hx CVA with chronic R HP 2016 noted Left parietal on FLAIR and DWI but had a large DDX including CIN, PRES, was  at [facility] center 06/14/14-06/24/14, given persistent of MRI finding, CVA is the cause  for R HP   7-21: Patient denies right hip pain on today's exam  22. AKI. Likely d/t dehydration, UTI.  - Give 1 L IVF normal saline at 100 cc/hr today. Repeat BMP in AM.  - Encourage PO fluids - 7/23: Cr improved; can stop IV fluids.  7/27 eating and drinking fairly well, not sure how accurate fluids are being tracked  7-28: Mild creatinine increase, recurrent.  Not yet at AKI level.  Will monitor with routine labs   - 7/31; BUN doubled today, creatinine staying stable.  Patient refusing IV fluid, really encouraging p.o. fluid intakes with nursing today.  Repeat in AM.  8/2- Cr down to 1.11- and BUN down to 29 from 35- will recheck Monday unless symptomatic.   8-4: BUN increased to 32,/creatinine stable 1.1.  Encouraged patient and mother at bedside to really push the p.o. fluids.  For both her blood pressure and her kidney function.  Should  not hold discharge    LOS: 21 days A FACE TO FACE EVALUATION WAS PERFORMED  Joesph JAYSON Likes 09/22/2023, 8:28 AM

## 2023-09-23 ENCOUNTER — Other Ambulatory Visit (HOSPITAL_COMMUNITY): Payer: Self-pay

## 2023-09-23 LAB — GLUCOSE, CAPILLARY: Glucose-Capillary: 215 mg/dL — ABNORMAL HIGH (ref 70–99)

## 2023-09-23 MED ORDER — COLLAGENASE 250 UNIT/GM EX OINT: TOPICAL_OINTMENT | Freq: Every day | CUTANEOUS | 0 refills | Status: AC

## 2023-09-23 NOTE — Progress Notes (Signed)
 Wound picture taken and measured. Wound supplies given to patient's mother. Wound care instructions done. Nurse to reinforce wound home instructions.

## 2023-09-23 NOTE — Progress Notes (Incomplete)
 Patient ID: Sarah Davis, female   DOB: 1960-03-26, 63 y.o.   MRN: [id number]  Graeme Jude, MSW, LCSW Office: [phone] Cell: [phone] Fax: [phone]

## 2023-09-23 NOTE — Progress Notes (Signed)
 PA provided discharge instructions TOC provided dc medications Staff assisted patient off the unit; patient discharged safely with mother via private car.    Geni Armor, LPN

## 2023-09-23 NOTE — Progress Notes (Signed)
   Sacral wound improving--area is now 1 cm X 0.8 cm X 0.1 cm. To continue santyl  daily.

## 2023-09-23 NOTE — Progress Notes (Signed)
 Educated/demonstrated wound care with mother. Supplies, pictures, and measurements obtained.   Geni Armor, LPN

## 2023-09-23 NOTE — Plan of Care (Signed)
  Problem: Consults Goal: RH GENERAL PATIENT EDUCATION Description: See Patient Education module for education specifics. Outcome: Progressing   Problem: RH BOWEL ELIMINATION Goal: RH STG MANAGE BOWEL WITH ASSISTANCE Description: STG Manage Bowel with minimal Assistance. Outcome: Progressing   Problem: RH BLADDER ELIMINATION Goal: RH STG MANAGE BLADDER WITH ASSISTANCE Description: STG Manage Bladder With minimal Assistance Outcome: Progressing   Problem: RH SKIN INTEGRITY Goal: RH STG SKIN FREE OF INFECTION/BREAKDOWN Description: Manage skin free of infection/breakdown with minimal assistance Outcome: Progressing   Problem: RH SAFETY Goal: RH STG ADHERE TO SAFETY PRECAUTIONS W/ASSISTANCE/DEVICE Description: STG Adhere to Safety Precautions With minimal Assistance/Device. Outcome: Progressing   Problem: RH PAIN MANAGEMENT Goal: RH STG PAIN MANAGED AT OR BELOW PT'S PAIN GOAL Description: < 4 w/prns Outcome: Progressing   Problem: RH KNOWLEDGE DEFICIT GENERAL Goal: RH STG INCREASE KNOWLEDGE OF SELF CARE AFTER HOSPITALIZATION Description: Manage increase knowledge of self care after hospitalization with minimal assistance from family using educational materials provided Outcome: Progressing

## 2023-09-23 NOTE — Plan of Care (Signed)
  Problem: RH BOWEL ELIMINATION Goal: RH STG MANAGE BOWEL WITH ASSISTANCE Description: STG Manage Bowel with minimal Assistance. Outcome: Progressing   Problem: RH BLADDER ELIMINATION Goal: RH STG MANAGE BLADDER WITH ASSISTANCE Description: STG Manage Bladder With minimal Assistance Outcome: Progressing   Problem: RH SKIN INTEGRITY Goal: RH STG SKIN FREE OF INFECTION/BREAKDOWN Description: Manage skin free of infection /breakdown with minimal assistance Outcome: Progressing   Problem: RH SAFETY Goal: RH STG ADHERE TO SAFETY PRECAUTIONS W/ASSISTANCE/DEVICE Description: STG Adhere to Safety Precautions With minimal Assistance/Device. Outcome: Progressing

## 2023-09-23 NOTE — Progress Notes (Addendum)
 Inpatient Rehabilitation Discharge Medication Review by a Pharmacist  A complete drug regimen review was completed for this patient to identify any potential clinically significant medication issues.  High Risk Drug Classes Is patient taking? Indication by Medication  Antipsychotic Yes PRN Quetiapine  - agitation  Anticoagulant No   Antibiotic No   Opioid No   Antiplatelet Yes Aspirin  81 mg - CVA prophylaxis  Hypoglycemics/insulin  Yes Insulin  glargine - DM  Vasoactive Medication Yes Tamsulosin  -BPH  Chemotherapy No   Other Yes Buspirone , lamictal  - anxiety, bipolar disorder/ mood/behavior Bethanechol  - urinary retention Collagenase  - wound care Dulera  - COPD/tobacco use/marijuana use Lactulose  - hyperammonemia Levothyroxine -hypothyroid Docusate - laxative Ferrous sulfate , folate, multivitamin - supplements Trazodone  - sleep  PRNs: Acetaminophen  - mild pain, headache Hydrocortisone -hydrocortisone  cream PR - hemorrhoids Nitroglycerin  SL- angina/ chest pain     Type of Medication Issue Identified Description of Issue Recommendation(s)  Drug Interaction(s) (clinically significant)     Duplicate Therapy     Allergy     No Medication Administration End Date     Incorrect Dose     Additional Drug Therapy Needed     Significant med changes from prior encounter (inform family/care partners about these prior to discharge). New: Dulera , bethanechol , tamsulosin , laxatives, supplements.  Atorvastatin , furosemide , gabapentin , paliperidone and weekly Vitamin D  discontinued. Communicate changes with patient /family prior to discharge.    Other       Clinically significant medication issues were identified that warrant physician communication and completion of prescribed/recommended actions by midnight of the next day: No  Pharmacist comments:   Time spent performing this drug regimen review (minutes):  [address]  Genaro Zebedee Calin, [place] 09/23/2023 9:24 AM

## 2023-09-23 NOTE — Progress Notes (Signed)
 PROGRESS NOTE   Subjective/Complaints:  No events overnight.  Excited for discharge today. No complaints.  Mother at bedside, provided clarification for UTI vs colonization.   Bladder scans low.  Excellent Bms overnight with bowel prep  ROS: limited by cognition/behavior + dysuria  --ongoing + Constipation--improved   Objective:   No results found.     Recent Labs    09/22/23 0910  WBC 10.6*  HGB 11.4*  HCT 36.7  PLT 291      Recent Labs    09/22/23 0910  NA 139  K 4.1  CL 103  CO2 24  GLUCOSE 177*  BUN 32*  CREATININE 1.15*  CALCIUM  9.6      Intake/Output Summary (Last 24 hours) at 09/23/2023 1429 Last data filed at 09/23/2023 1000 Gross per 24 hour  Intake 360 ml  Output --  Net 360 ml        Physical Exam: Vital Signs Blood pressure 109/71, pulse 79, temperature 98.4 F (36.9 C), temperature source Oral, resp. rate 19, height 5' (1.524 m), weight 53.2 kg, SpO2 98%.   General: Appropriate appearance for age.  No acute distress.  Sitting up at bedside. HENT: conjugate gaze; oropharynx dry CV: regular rate and rhythm- however rate in 90's; no JVD Pulmonary: CTA B/L; no W/R/R- good air movement GI: soft, NT, ND, (+)BS- hypoactive Psychiatric: Appropriate mood and affect.  Somewhat flat. Neurological: Awake, alert, and oriented x 2.  Some mild to moderate memory deficits. Skin:  Unstageable buttocks with adherent slough; MASD surrounding--improved appearance    MSK:        + L shoulder drop-arm; palpable enlargement along L lateral glenoid. No TTP  -- 8/4 Exam SA ROM >110 degrees - much improved  Neurologic exam:  Cognition: Oriented to person, place, and time.  Limited insight and awareness. Mood: Flat affect, intermittently agitated Sensation: Equal and intact in BL UE and Les.  Reflexes: 2+ in BL UE and LEs. CN: 2-12 grossly intact.  Coordination: BL UE intention tremor--none  apparent at rest Strength: Moving all 4 extremities at least 5/5.     Assessment/Plan: 1. Functional deficits which require 3+ hours per day of interdisciplinary therapy in a comprehensive inpatient rehab setting. Physiatrist is providing close team supervision and 24 hour management of active medical problems listed below. Physiatrist and rehab team continue to assess barriers to discharge/monitor patient progress toward functional and medical goals  Care Tool:  Bathing    Body parts bathed by patient: Right arm, Left arm, Chest, Abdomen, Front perineal area, Right upper leg, Left upper leg, Face, Buttocks, Right lower leg, Left lower leg   Body parts bathed by helper: Buttocks     Bathing assist Assist Level: Supervision/Verbal cueing     Upper Body Dressing/Undressing Upper body dressing   What is the patient wearing?: Pull over shirt    Upper body assist Assist Level: Set up assist    Lower Body Dressing/Undressing Lower body dressing      What is the patient wearing?: Underwear/pull up, Pants     Lower body assist Assist for lower body dressing: Supervision/Verbal cueing     Toileting Toileting    Toileting  assist Assist for toileting: Supervision/Verbal cueing     Transfers Chair/bed transfer  Transfers assist  Chair/bed transfer activity did not occur: Safety/medical concerns  Chair/bed transfer assist level: Supervision/Verbal cueing     Locomotion Ambulation   Ambulation assist      Assist level: Supervision/Verbal cueing Assistive device: Other (comment) Max distance: 200'   Walk 10 feet activity   Assist     Assist level: Supervision/Verbal cueing Assistive device: No Device   Walk 50 feet activity   Assist Walk 50 feet with 2 turns activity did not occur: Safety/medical concerns  Assist level: Supervision/Verbal cueing Assistive device: No Device    Walk 150 feet activity   Assist Walk 150 feet activity did not occur:  Safety/medical concerns  Assist level: Supervision/Verbal cueing Assistive device: No Device    Walk 10 feet on uneven surface  activity   Assist Walk 10 feet on uneven surfaces activity did not occur: Safety/medical concerns   Assist level: Supervision/Verbal cueing     Wheelchair     Assist Is the patient using a wheelchair?: No Type of Wheelchair: Manual    Wheelchair assist level: Dependent - Patient 0% Max wheelchair distance: 150'    Wheelchair 50 feet with 2 turns activity    Assist        Assist Level: Dependent - Patient 0%   Wheelchair 150 feet activity     Assist      Assist Level: Dependent - Patient 0%   Blood pressure 109/71, pulse 79, temperature 98.4 F (36.9 C), temperature source Oral, resp. rate 19, height 5' (1.524 m), weight 53.2 kg, SpO2 98%.  Medical Problem List and Plan: 1. 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 Severe motor planning and apraxia, impulsive ex. Tried to sit mid-air. Max A overall but can power up with Min A. Will need 24/7 Min A on discharge.   7/22: Apraxia, initiation and sequencing better. Gait limited by poor therapy tolerance/behaviors. D3 diet with thin liquids, full supervision.   7-23: Reduce therapies to daily due to poor tolerance; will discuss with family earlier discharge date given continued refusal--per patient's mother, she will participate as long as she is there, no plan to move date at this time  7/28: CGA to Min A goals with OT - progressing well. Doing better with participation this week. CGA to Min A ambulation without AD; c/b balance and endurance deficits. SLP moderate cog deficits c/b poor effort and behavior. Per mom, some wordfinding deficits but otherwise close to baseline - Mod A currently with goal Min A.  8-1: Tachycardic,  increased hypotension this afternoon.  Getting IV fluids, EKG, lab work; DC Flomax  as below.  Con't CIR PT, OT and SLP   The patient is medically ready for discharge to home and will need follow-up with [facility] PM[facility]R. In addition, they will need to follow up with their PCP, cardiology, psychiatry, urology.     2.  Antithrombotics: -DVT/anticoagulation:  Pharmaceutical: Lovenox              -antiplatelet therapy: Aspirin  81 mg daily  3. Pain Management: Tylenol  as needed  4. Mood/Behavior/Sleep/bipolar disorder II - poorly controlled: BuSpar  15 mg twice daily, Lamictal  titrating to 100 mg daily             -  antipsychotic agents: N/A  - Per psych consult inpatient: Will plan to slowly titrate lamictal  since she has not been taking it for at least 1 month and to prevent risk of side effects such as SJS. Will also plan to start trazodone  at a lower dose compared to her home dose of 400 mg in setting of preventing additional sedation, altered mental status, and since she had been sleeping well while in the hospital. We will defer to outpatient psychiatry for titrating trazodone  if indicated. She can follow-up with her outpatient psychiatric provider at [facility] upon discharge.  - ?resumption on invega 3 mg; will defer to OP  -  Invega last filled March 2025, would not restart at this time before discussing reasoning/side effects with mother. --They are unsure about this medication - Move therapies to daily for tolerance. 09-10-24: Increase trazodone  to 100 mg nightly--sleeping well  5. Neuropsych/cognition: This patient is not capable of making decisions on her own behalf.  - 7/22: Started on telesitter for OOB attempts; discussed with patient and mom possible veil bed if behaviors continue--intermittent impulsivity, continue TeleSitter  6. Skin/Wound Care: Routine skin checks   - 7/15: Unstageable sacral PI; awaiting WC consult. Safety zone report completed.   Cleanse with Vashe [id number], not  rinse, pat dry the peri-wound skin. Apply Xeroform on the wound bed daily. Cover with sacrum foam dressing changing every 3 days or PRN soiling.   7/22: Wound care, changing to Santyl  for debridement daily, then cover with moist 2 x 2 and foam dressing with dressing changes every 3 days or as needed. --Looking better.  7. Fluids/Electrolytes/Nutrition: Routine in and outs with follow-up chemistries   - 7/15: Albumin 2.3; monitor PO intakes, Ensure BID. See #15 below.   - 7-21: No AM labs; will order for late today--mild hyponatremia, 132.  Other labs are stable.  Repeat in a.m, may have been secondary to Keflex  over the weekend which is now discontinued.  7-22: Hyponatremia-resolved.  8/2- Na 137  8-4: Labs stable/mild AKI as below.  Discussed with patient and mother at bedside improving fluid intakes.  8.  CAD/Non-STEMI/acute on chronic diastolic congestive heart failure.  Cardiac catheterization found to have chronic LAD.  Continue aspirin  therapy   - daily weights --not being performed, will reorder today   - 7/29: Complaining of intermittent CP, not reproducible, c/w stable angina, resolved at time of exam. Known near-total occlusion of LAD. Reminded patient to be vocal when this occurs. Getting EKG, orthostats and CXR for eval  7-30: EKG unchanged, chest x-ray normal, orthostats positive; ordered gentle IV fluids 500 cc today, patient refusing.  Reduce Flomax  to 0.4 mg daily.  8-1: Very low blood pressure, tachycardic.  DC Flomax .  EKG, labs pending.  Will give 1 L IV fluid at 250 cc/h due to heart failure.  8/2- NO weight this AM- pt refused per chart  8/3- No weight this Am- will d/w nursing  -4: Weights inaccurate. Filed Weights   09/22/23 0800 09/22/23 1206 09/23/23 0700  Weight: 53.8 kg 53.8 kg 53.2 kg     9.  Diabetes mellitus with peripheral neuropathy.  Hemoglobin A1c 4.8.  NovoLog  5 units 3 times daily, Semglee  12 units daily             -CBGs well controlled--intermittent  elevations but overall well-controlled  8/2- Cbgs overall good, but intermittent bumps to 229 today0 con't to monitor trend  8/3- looking better today Recent Labs    09/22/23 1202 09/22/23 1631  09/23/23 0821  GLUCAP 141* 114* 215*     10.  Leukocytosis/UTI.  Antibiotic therapy completed.  See below 11.  Hyperammonemia.  Continue Chronulac  10 mg twice daily.  Follow-up ammonia levels             -last ammonia level 20 on 7/9--resolved  12.  Hypothyroidism.  Synthroid  13.  Documented CKD stage III.  Latest creatinine 0.95.  Follow-up chemistries.  Follow-up with [facility] nephrology  - BUN/creatinine have remained stable within her normal range  14.  Chronic anemia.  Follow-up CBC. Stable 9-10.  Add iron supplement.  15.  Decreased nutritional storage.  Diet advanced to regular.  Follow-up dietary services             -intake poor/inconsistent             -engage RD for assistance   - Improved/resolved  16.  COPD/tobacco use/marijuana use.  Continue inhalers as directed.  Check oxygen saturations every shift.  Urine drug screen negative.  Provide counseling   17. Urinary retention/relapsing UTI versus colonization Foley replaced 7/4. Awaiting WC consult as above for recs prior to removal--see above, DC foley trial started 7/15.    - 7/16 2x ISC overnight; 1x incontinent void; increase flomax  to 0.8 mg, start timed toiletting Q4-6 hours  - 7/17: Start bethenachol 10 mg TID for stimulation; get urinalysis  - 7-18: No improvement in urinary retention; urinalysis borderline but will opt to treat in light of symptoms.  Start Keflex  500 mg twice daily for 7 days.  Add on urine culture--follow-up on results- > 100K Klebsiealla p await sensitivities   7-21: Pan resistant Klebsiella pneumonia; .  With same species on 7-3 urine culture, was through course of meropenem  at that time.  Pharmacy started Invanz  1 g for 3 doses yesterday for ESBL; discussing with ID pharmacy given failure of their  first treatment.  Will get renal ultrasound today to evaluate for structural causes of recurrence  7-22: Improving continence, continue current regimen.  Renal ultrasound with some mild left-sided hydronephrosis, no other significant findings.  Since she has resumed spontaneous urination, will monitor for now, may consider CT abdomen and pelvis if suspicion for obstructing stone  7-23: DC bethanechol ; continue PVRs, discussed with nursing  7-24: Started retaining again overnight, resume bethanechol  10 mg 3 times daily.  Will get CT renal study today to evaluate.  7-25: Symptoms resolved with bethanechol , CT study with some signs of cystitis but with recent UTI this could be residual.  No stones, no other features concerning for bladder obstruction.  Would continue through today and then may try to wean to 5 mg 3 times daily over the weekend, but would not discontinue. - Will need outpatient urology follow-up  7/26-27--incontinent. No retention reported but no scans done either.  -will reduce urecholine  to 5mg  tid 7-28: Voiding but incontinent with current regimen; resume PVRs--did remind nursing that these needs to be done to monitor medication changes.  Urinalysis today improved.  7/29: Retained again today; increase bethenachol to 10 mg TID.   7-30: No further retention on increased dose.  Reduce Flomax  to 0.4 mg due to orthostasis.  7-31: Patient reporting dysuria again, mom reporting foul urine.  Is continuing to void continent with reduced Flomax .  Start Pyridium  3 times daily, will get repeat urinalysis--looks positive, presumably reinfection vs. colonization with Klebsiella again.  Given no other signs of infection aside from ongoing retention, will hold off on repeat treatment at this time, get  urology evaluation in a.m.  8/1: DC Flomax  due to orthostasis, resume PVRs.  With again positive urinalysis, consulted urology, infectious disease regarding ongoing urinary retention with benign  imaging and recurrent positive ESBL on urine culture.  Recommendations by both no further antibiotics, likely colonized, continue bethanechol  10 mg 3 times daily and get outpatient urology assessment.  Likely secondary to diabetes, peripheral neuropathy, neurogenic bladder and recent hospitalization--has likely been retaining for some time.  --Needs PVRs over this weekend 8/2- Ordered Bladder scans so can schedule- q6 hours for 3 days- don't see PVR order? 8/3 PVR x2 0-10cc- still c/o dysuria- will change Pyridium  to scheduled 100 mg TID x 2 days 8-4: Moved Pyridium  to as needed..  She is continuing to do straight caths, will resume Flomax  0.4 mg.  Constipation can  contribute--see below--voiding well at discharge  18. Constipation. Small, hard BM this AM. Add Miralax  daily, sennakot s 1 tab BID.  On Chronulac  10 mg twice daily.   - BM 7/16, large, with some rectal bleeding--add cream  -7-21: Multiple large, incontinent, liquid bowel movements overnight.  Likely secondary to antibiotics, DC daily Senokot, Chronulac --continue miralax     LBM 7/31 per chart review- pt reports doesn't remember- if no BM by tomorrow, will need to intervene  8/3- will order Sorbitol - pt Ok'd will take- 30 cc at noon  8-4: No results of sorbitol , will get bowel prep today--large bowel movement x2  19. L drop arm. ?RTC. Patient endorses fall 2 months ago with no workup. Will xray.   - 7/16: Xray w/o acute fractuire or dislocation  - 7/29: Palpable muscle retraction at glenoid, nonpainful ROM deficits c/w RTC; will need OP ortho eval once medically stable  20. L calcified carotid plaque. CTA vs US  evaluation as OP.   21. Hx CVA with chronic R HP 2016 noted Left parietal on FLAIR and DWI but had a large DDX including CIN, PRES, was at [facility] center 06/14/14-06/24/14, given persistent of MRI finding, CVA is the cause  for R HP   7-21: Patient denies right hip pain on today's exam  22. AKI. Likely d/t dehydration, UTI.  -  Give 1 L IVF normal saline at 100 cc/hr today. Repeat BMP in AM.  - Encourage PO fluids - 7/23: Cr improved; can stop IV fluids.  7/27 eating and drinking fairly well, not sure how accurate fluids are being tracked  7-28: Mild creatinine increase, recurrent.  Not yet at AKI level.  Will monitor with routine labs   - 7/31; BUN doubled today, creatinine staying stable.  Patient refusing IV fluid, really encouraging p.o. fluid intakes with nursing today.  Repeat in AM.  8/2- Cr down to 1.11- and BUN down to 29 from 35- will recheck Monday unless symptomatic.   8-4: BUN increased to 32,/creatinine stable 1.1.  Encouraged patient and mother at bedside to really push the p.o. fluids.  For both her blood pressure and her kidney function.  Should not hold discharge    LOS: 22 days A FACE TO FACE EVALUATION WAS PERFORMED  Joesph JAYSON Likes 09/23/2023, 2:29 PM

## 2023-09-25 NOTE — Progress Notes (Signed)
 Inpatient Rehabilitation Care Coordinator Discharge Note   Patient Details  Name: Sarah Davis MRN: [id number] Date of Birth: 10-17-1960   Discharge location: D/c to her daughter's home  Length of Stay: 21 days  Discharge activity level: Supervision  Home/community participation: LImited  Patient response un:Yzjouy Literacy - How often do you need to have someone help you when you read instructions, pamphlets, or other written material from your doctor or pharmacy?: Rarely  Patient response un:Dnrpjo Isolation - How often do you feel lonely or isolated from those around you?: Patient unable to respond  Services provided included: MD, RD, OT, SLP, PT, CM, RN, Pharmacy, Neuropsych, SW, TR  Financial Services:  Field seismologist Utilized: Private Insurance [employer]  Choices offered to/list presented to: patient mother and dtr  Follow-up services arranged:  Patient/Family has no preference for HH/DME agencies, Other (Comment) (Pt is cardiac rehab- will need referral from cardiology when cleared)           Patient response to transportation need: Is the patient able to respond to transportation needs?: Yes In the past 12 months, has lack of transportation kept you from medical appointments or from getting medications?: No In the past 12 months, has lack of transportation kept you from meetings, work, or from getting things needed for daily living?: No   Patient/Family verbalized understanding of follow-up arrangements:  Yes  Individual responsible for coordination of the follow-up plan: contact pt dtr August #[phone]  Confirmed correct DME delivered: Graeme DELENA Jude 09/25/2023    Comments (or additional information):fam edu completed  Summary of Stay    Date/Time Discharge Planning CSW  09/22/23 1527 Pt will d/c to her daughter's home and will have support with her daughter assisting. PCS referral sent to her insurance. Fam edu completed on  Friday.  PCS referral submitted.  SW will confirm there are no barriers to discharge. AAC  09/15/23 1423 Pt will d/c to her daughter's home and will have support with her daughter assisting. PCS referral sent to her insurance. Fam edu pending. SW will confirm there are no barriers to discharge. AAC  09/08/23 1213 Pt will d/c to her daughter's home and will have support with her daughter assisting. PCS services will be explored. SW will confirm there are no barriers to discharge. AAC  09/02/23 1013 TBA. Per EMR, pt will d/c to home with her mother and daughter assisting. PCS services will be explored. SW will confirm there are no barriers to discharge. AAC       Blessed Girdner A Jude

## 2023-09-26 ENCOUNTER — Telehealth: Payer: Self-pay | Admitting: Urology

## 2023-09-26 NOTE — Telephone Encounter (Signed)
 This patient was referred to [facility] after being discharged from the hospital. [facility] said they do not accept her insurance and referred her to our office and said that you saw her while she was admitted. I see in her chart where a nurse practitioner by the name of Ole Bourdon saw her and discussed what was going on with you and Dr Emeline on 09/19/2023, but I can not find where you specifically met with her. Anyway, your next available is not until September, and I wanted to know before scheduling this patient if this is someone you would like to see sooner. I routed the note from 09/19/2023 to you as well to hopefully help you find it.   It was the daughter (August) that called us . There is no DPR on file, but patient gave verbal permission for us  to speak with her daughter until she comes to our office and fills out DPR.

## 2023-09-29 ENCOUNTER — Telehealth: Payer: Self-pay | Admitting: Urology

## 2023-09-29 NOTE — Telephone Encounter (Signed)
 LVM for patient to call back and sch appt for next available.

## 2023-09-30 MED FILL — Medication: Qty: 1 | Status: AC

## 2023-10-08 ENCOUNTER — Other Ambulatory Visit (HOSPITAL_COMMUNITY): Payer: Self-pay

## 2023-11-17 ENCOUNTER — Encounter: Payer: MEDICAID | Attending: Physical Medicine and Rehabilitation | Admitting: Physical Medicine and Rehabilitation

## 2023-11-19 ENCOUNTER — Ambulatory Visit: Payer: MEDICAID | Attending: Physical Medicine and Rehabilitation | Admitting: Speech Pathology

## 2024-03-03 ENCOUNTER — Ambulatory Visit: Payer: MEDICAID | Admitting: Urology
